# Patient Record
Sex: Female | Born: 1965 | Race: White | Hispanic: No | Smoking: Never smoker
Health system: Southern US, Community
[De-identification: ages and names within clinical notes are randomized; demographics above are authoritative.]

## PROBLEM LIST (undated history)

## (undated) DIAGNOSIS — N760 Acute vaginitis: Secondary | ICD-10-CM

## (undated) DIAGNOSIS — N390 Urinary tract infection, site not specified: Secondary | ICD-10-CM

## (undated) DIAGNOSIS — Z8489 Family history of other specified conditions: Secondary | ICD-10-CM

## (undated) DIAGNOSIS — F419 Anxiety disorder, unspecified: Secondary | ICD-10-CM

## (undated) DIAGNOSIS — B9689 Other specified bacterial agents as the cause of diseases classified elsewhere: Secondary | ICD-10-CM

## (undated) DIAGNOSIS — C801 Malignant (primary) neoplasm, unspecified: Secondary | ICD-10-CM

## (undated) DIAGNOSIS — F32A Depression, unspecified: Secondary | ICD-10-CM

## (undated) DIAGNOSIS — IMO0002 Reserved for concepts with insufficient information to code with codable children: Secondary | ICD-10-CM

## (undated) DIAGNOSIS — N841 Polyp of cervix uteri: Secondary | ICD-10-CM

## (undated) DIAGNOSIS — R87619 Unspecified abnormal cytological findings in specimens from cervix uteri: Secondary | ICD-10-CM

## (undated) HISTORY — DX: Depression, unspecified: F32.A

## (undated) HISTORY — DX: Other specified bacterial agents as the cause of diseases classified elsewhere: B96.89

## (undated) HISTORY — DX: Unspecified abnormal cytological findings in specimens from cervix uteri: R87.619

## (undated) HISTORY — DX: Urinary tract infection, site not specified: N39.0

## (undated) HISTORY — DX: Acute vaginitis: N76.0

## (undated) HISTORY — DX: Anxiety disorder, unspecified: F41.9

## (undated) HISTORY — DX: Reserved for concepts with insufficient information to code with codable children: IMO0002

## (undated) HISTORY — DX: Polyp of cervix uteri: N84.1

## (undated) HISTORY — PX: CERVICAL CONE BIOPSY: SUR198

## (undated) HISTORY — PX: LASIK: SHX215

---

## 1994-02-11 HISTORY — PX: CERVICAL CONE BIOPSY: SUR198

## 2000-06-13 ENCOUNTER — Other Ambulatory Visit: Admission: RE | Admit: 2000-06-13 | Discharge: 2000-06-13 | Payer: Self-pay | Admitting: Obstetrics and Gynecology

## 2004-03-06 ENCOUNTER — Other Ambulatory Visit: Admission: RE | Admit: 2004-03-06 | Discharge: 2004-03-06 | Payer: Self-pay | Admitting: Obstetrics and Gynecology

## 2007-02-09 ENCOUNTER — Other Ambulatory Visit: Admission: RE | Admit: 2007-02-09 | Discharge: 2007-02-09 | Payer: Self-pay | Admitting: Obstetrics and Gynecology

## 2007-10-02 ENCOUNTER — Ambulatory Visit (HOSPITAL_COMMUNITY): Admission: RE | Admit: 2007-10-02 | Discharge: 2007-10-02 | Payer: Self-pay | Admitting: Obstetrics and Gynecology

## 2008-03-02 ENCOUNTER — Other Ambulatory Visit: Admission: RE | Admit: 2008-03-02 | Discharge: 2008-03-02 | Payer: Self-pay | Admitting: Obstetrics and Gynecology

## 2008-10-03 ENCOUNTER — Ambulatory Visit (HOSPITAL_COMMUNITY): Admission: RE | Admit: 2008-10-03 | Discharge: 2008-10-03 | Payer: Self-pay | Admitting: Obstetrics and Gynecology

## 2009-04-13 ENCOUNTER — Other Ambulatory Visit: Admission: RE | Admit: 2009-04-13 | Discharge: 2009-04-13 | Payer: Self-pay | Admitting: Obstetrics and Gynecology

## 2009-10-06 ENCOUNTER — Ambulatory Visit (HOSPITAL_COMMUNITY): Admission: RE | Admit: 2009-10-06 | Discharge: 2009-10-06 | Payer: Self-pay | Admitting: Obstetrics and Gynecology

## 2010-05-09 ENCOUNTER — Other Ambulatory Visit: Payer: Self-pay | Admitting: Obstetrics and Gynecology

## 2010-05-09 ENCOUNTER — Other Ambulatory Visit (HOSPITAL_COMMUNITY)
Admission: RE | Admit: 2010-05-09 | Discharge: 2010-05-09 | Disposition: A | Discharge status: Home / Self Care | Payer: BC Managed Care – PPO | Source: Ambulatory Visit | Attending: Obstetrics and Gynecology | Admitting: Obstetrics and Gynecology

## 2010-05-09 DIAGNOSIS — Z01419 Encounter for gynecological examination (general) (routine) without abnormal findings: Secondary | ICD-10-CM | POA: Insufficient documentation

## 2010-09-20 ENCOUNTER — Other Ambulatory Visit: Payer: Self-pay | Admitting: Obstetrics and Gynecology

## 2010-09-20 DIAGNOSIS — Z139 Encounter for screening, unspecified: Secondary | ICD-10-CM

## 2010-10-08 ENCOUNTER — Ambulatory Visit (HOSPITAL_COMMUNITY)
Admission: RE | Admit: 2010-10-08 | Discharge: 2010-10-08 | Disposition: A | Discharge status: Home / Self Care | Payer: BC Managed Care – PPO | Source: Ambulatory Visit | Attending: Obstetrics and Gynecology | Admitting: Obstetrics and Gynecology

## 2010-10-08 ENCOUNTER — Ambulatory Visit (HOSPITAL_COMMUNITY): Payer: BC Managed Care – PPO

## 2010-10-08 DIAGNOSIS — Z1231 Encounter for screening mammogram for malignant neoplasm of breast: Secondary | ICD-10-CM | POA: Insufficient documentation

## 2010-10-08 DIAGNOSIS — Z139 Encounter for screening, unspecified: Secondary | ICD-10-CM

## 2010-10-18 ENCOUNTER — Other Ambulatory Visit: Payer: Self-pay | Admitting: Obstetrics and Gynecology

## 2010-10-18 DIAGNOSIS — R928 Other abnormal and inconclusive findings on diagnostic imaging of breast: Secondary | ICD-10-CM

## 2010-10-23 ENCOUNTER — Other Ambulatory Visit: Payer: Self-pay | Admitting: Obstetrics and Gynecology

## 2010-10-23 DIAGNOSIS — R928 Other abnormal and inconclusive findings on diagnostic imaging of breast: Secondary | ICD-10-CM

## 2010-10-24 ENCOUNTER — Ambulatory Visit
Admission: RE | Admit: 2010-10-24 | Discharge: 2010-10-24 | Disposition: A | Discharge status: Home / Self Care | Payer: BC Managed Care – PPO | Source: Ambulatory Visit | Attending: Obstetrics and Gynecology | Admitting: Obstetrics and Gynecology

## 2010-10-24 DIAGNOSIS — R928 Other abnormal and inconclusive findings on diagnostic imaging of breast: Secondary | ICD-10-CM

## 2011-05-20 ENCOUNTER — Other Ambulatory Visit (HOSPITAL_COMMUNITY)
Admission: RE | Admit: 2011-05-20 | Discharge: 2011-05-20 | Disposition: A | Discharge status: Home / Self Care | Payer: BC Managed Care – PPO | Source: Ambulatory Visit | Attending: Obstetrics and Gynecology | Admitting: Obstetrics and Gynecology

## 2011-05-20 ENCOUNTER — Other Ambulatory Visit: Payer: Self-pay | Admitting: Obstetrics and Gynecology

## 2011-05-20 DIAGNOSIS — Z01419 Encounter for gynecological examination (general) (routine) without abnormal findings: Secondary | ICD-10-CM | POA: Insufficient documentation

## 2011-09-18 ENCOUNTER — Other Ambulatory Visit: Payer: Self-pay | Admitting: Obstetrics and Gynecology

## 2011-09-18 DIAGNOSIS — Z1231 Encounter for screening mammogram for malignant neoplasm of breast: Secondary | ICD-10-CM

## 2011-10-09 ENCOUNTER — Ambulatory Visit
Admission: RE | Admit: 2011-10-09 | Discharge: 2011-10-09 | Disposition: A | Discharge status: Home / Self Care | Payer: BC Managed Care – PPO | Source: Ambulatory Visit | Attending: Obstetrics and Gynecology | Admitting: Obstetrics and Gynecology

## 2011-10-09 DIAGNOSIS — Z1231 Encounter for screening mammogram for malignant neoplasm of breast: Secondary | ICD-10-CM

## 2012-07-15 ENCOUNTER — Encounter: Payer: Self-pay | Admitting: *Deleted

## 2012-07-15 DIAGNOSIS — N841 Polyp of cervix uteri: Secondary | ICD-10-CM

## 2012-07-16 ENCOUNTER — Encounter: Payer: Self-pay | Admitting: Obstetrics and Gynecology

## 2012-07-16 ENCOUNTER — Ambulatory Visit (INDEPENDENT_AMBULATORY_CARE_PROVIDER_SITE_OTHER): Payer: BC Managed Care – PPO | Admitting: Obstetrics and Gynecology

## 2012-07-16 ENCOUNTER — Other Ambulatory Visit (HOSPITAL_COMMUNITY)
Admission: RE | Admit: 2012-07-16 | Discharge: 2012-07-16 | Disposition: A | Discharge status: Home / Self Care | Payer: BC Managed Care – PPO | Source: Ambulatory Visit | Attending: Obstetrics and Gynecology | Admitting: Obstetrics and Gynecology

## 2012-07-16 VITALS — BP 104/70 | Ht 70.0 in | Wt 116.2 lb

## 2012-07-16 DIAGNOSIS — Z1212 Encounter for screening for malignant neoplasm of rectum: Secondary | ICD-10-CM

## 2012-07-16 DIAGNOSIS — R87810 Cervical high risk human papillomavirus (HPV) DNA test positive: Secondary | ICD-10-CM | POA: Insufficient documentation

## 2012-07-16 DIAGNOSIS — Z01419 Encounter for gynecological examination (general) (routine) without abnormal findings: Secondary | ICD-10-CM

## 2012-07-16 DIAGNOSIS — Z1151 Encounter for screening for human papillomavirus (HPV): Secondary | ICD-10-CM | POA: Insufficient documentation

## 2012-07-16 LAB — HEMOCCULT GUIAC POC 1CARD (OFFICE): Fecal Occult Blood, POC: NEGATIVE

## 2012-07-16 MED ORDER — ETONOGESTREL-ETHINYL ESTRADIOL 0.12-0.015 MG/24HR VA RING: VAGINAL_RING | VAGINAL | Status: DC

## 2012-07-16 NOTE — Patient Instructions (Signed)
Keep up the good work Labs next year

## 2012-07-16 NOTE — Progress Notes (Signed)
Patient ID: Tara Jensen, female   DOB: 02-02-1966, 47 y.o.   MRN: [id number] Pt here today for annual exam, voiced no problems or complaints today Subjective:     Tara Jensen is a 47 y.o. female here for a routine exam.  Current complaints: none.  Personal health questionnaire reviewed: no.   Gynecologic History Patient's last menstrual period was 06/22/2012. Contraception: NuvaRing vaginal inserts Last Pap: 2013. Results were: normal Last mammogram: 2013. Results were: normal  Obstetric History OB History   Grav Para Term Preterm Abortions TAB SAB Ect Mult Living   1 1        1      # Outc Date GA Lbr Len/2nd Wgt Sex Del Anes PTL Lv   1 PAR 1998    M SVD   Yes         Review of Systems  Review of Systems  Constitutional: Negative for fever, chills, weight loss, malaise/fatigue and diaphoresis.  HENT: Negative for hearing loss, ear pain, nosebleeds, congestion, sore throat, neck pain, tinnitus and ear discharge.   Eyes: Negative for blurred vision, double vision, photophobia, pain, discharge and redness.  Respiratory: Negative for cough, hemoptysis, sputum production, shortness of breath, wheezing and stridor.   Cardiovascular: Negative for chest pain, palpitations, orthopnea, claudication, leg swelling and PND.  Gastrointestinal: negative for abdominal pain. Negative for heartburn, nausea, vomiting, diarrhea, constipation, blood in stool and melena.  Genitourinary: Negative for dysuria, urgency, frequency, hematuria and flank pain.  Musculoskeletal: Negative for myalgias, back pain, joint pain and falls.  Skin: Negative for itching and rash.  Neurological: Negative for dizziness, tingling, tremors, sensory change, speech change, focal weakness, seizures, loss of consciousness, weakness and headaches.  Endo/Heme/Allergies: Negative for environmental allergies and polydipsia. Does not bruise/bleed easily.  Psychiatric/Behavioral: Negative for depression, suicidal ideas,  hallucinations, memory loss and substance abuse. The patient is not nervous/anxious and does not have insomnia.        Objective:    Physical Exam  Vitals reviewed. Constitutional: She is oriented to person, place, and time. She appears well-developed and well-nourished.  HENT:  Head: Normocephalic and atraumatic.         GI: Soft. Bowel sounds are normal. She exhibits no distension and no mass. There is no tenderness. There is no rebound and no guarding.  Genitourinary:       Vulva is normal without lesions Vagina is pink moist without discharge Cervix normal in appearance and pap is done Uterus is normal size shape and contour Adnexa is negative with normal sized ovaries by sonogram  Musculoskeletal: Normal range of motion. She exhibits no edema and no tenderness.  Neurological: She is alert and oriented to person, place, and time. She has normal reflexes. She displays normal reflexes. No cranial nerve deficit. She exhibits normal muscle tone. Coordination normal.  Skin: Skin is warm and dry. No rash noted. No erythema. No pallor.  Psychiatric: She has a normal mood and affect. Her behavior is normal. Judgment and thought content normal.       Assessment:    Healthy female exam.    Plan:    Contraception: NuvaRing vaginal inserts.

## 2012-09-01 ENCOUNTER — Other Ambulatory Visit: Payer: Self-pay

## 2012-09-01 DIAGNOSIS — Z1231 Encounter for screening mammogram for malignant neoplasm of breast: Secondary | ICD-10-CM

## 2012-10-09 ENCOUNTER — Ambulatory Visit: Payer: BC Managed Care – PPO

## 2012-10-16 ENCOUNTER — Ambulatory Visit
Admission: RE | Admit: 2012-10-16 | Discharge: 2012-10-16 | Disposition: A | Discharge status: Home / Self Care | Payer: BC Managed Care – PPO | Source: Ambulatory Visit

## 2012-10-16 DIAGNOSIS — Z1231 Encounter for screening mammogram for malignant neoplasm of breast: Secondary | ICD-10-CM

## 2013-07-19 ENCOUNTER — Encounter: Payer: Self-pay | Admitting: Obstetrics and Gynecology

## 2013-07-19 ENCOUNTER — Other Ambulatory Visit (HOSPITAL_COMMUNITY)
Admission: RE | Admit: 2013-07-19 | Discharge: 2013-07-19 | Disposition: A | Discharge status: Home / Self Care | Payer: BC Managed Care – PPO | Source: Ambulatory Visit | Attending: Obstetrics and Gynecology | Admitting: Obstetrics and Gynecology

## 2013-07-19 ENCOUNTER — Ambulatory Visit (INDEPENDENT_AMBULATORY_CARE_PROVIDER_SITE_OTHER): Payer: BC Managed Care – PPO | Admitting: Obstetrics and Gynecology

## 2013-07-19 VITALS — BP 90/62 | Ht 70.75 in | Wt 118.4 lb

## 2013-07-19 DIAGNOSIS — Z1212 Encounter for screening for malignant neoplasm of rectum: Secondary | ICD-10-CM

## 2013-07-19 DIAGNOSIS — Z01419 Encounter for gynecological examination (general) (routine) without abnormal findings: Secondary | ICD-10-CM

## 2013-07-19 DIAGNOSIS — Z1151 Encounter for screening for human papillomavirus (HPV): Secondary | ICD-10-CM | POA: Insufficient documentation

## 2013-07-19 LAB — HEMOCCULT GUIAC POC 1CARD (OFFICE): Fecal Occult Blood, POC: NEGATIVE

## 2013-07-19 MED ORDER — ETONOGESTREL-ETHINYL ESTRADIOL 0.12-0.015 MG/24HR VA RING: VAGINAL_RING | VAGINAL | Status: DC

## 2013-07-19 NOTE — Patient Instructions (Signed)
HPV Test The HPV (human papillomavirus) test is used to screen for high-risk types with HPV infection. HPV is a group of about 100 related viruses, of which 40 types are genital viruses. Most HPV viruses cause infections that usually resolve without treatment within 2 years. Some HPV infections can cause skin and genital warts (condylomata). HPV types 16, 18, 31 and 45 are considered high-risk types of HPV. High-risk types of HPV do not usually cause visible warts, but if untreated, may lead to cancers of the outlet of the womb (cervix) or anus. An HPV test identifies the DNA (genetic) strands of the HPV infection. Because the test identifies the DNA strands, the test is also referred to as the HPV DNA test. Although HPV is found in both males and females, the HPV test is only used to screen for cervical cancer in females. This test is recommended for females:  With an abnormal Pap test.  After treatment of an abnormal Pap test.  Aged 42 and older.  After treatment of a high-risk HPV infection. The HPV test may be done at the same time as a Pap test in females over the age of 25. Both the HPV and Pap test require a sample of cells from the cervix. PREPARATION FOR TEST  You may be asked to avoid douching, tampons, or vaginal medicines for 48 hours before the HPV test. You will be asked to urinate before the test. For the HPV test, you will need to lie on an exam table with your feet in stirrups. A spatula will be inserted into the vagina. The spatula will be used to swab the cervix for a cell and mucus sample. The sample will be further evaluated in a lab under a microscope. NORMAL FINDINGS  Normal: High-risk HPV is not found.  Ranges for normal findings may vary among different laboratories and hospitals. You should always check with your doctor after having lab work or other tests done to discuss the meaning of your test results and whether your values are considered within normal limits. MEANING  OF TEST An abnormal HPV test means that high-risk HPV is found. Your caregiver may recommend further testing. Your caregiver will go over the test results with you. He or she will and discuss the importance and meaning of your results, as well as treatment options and the need for additional tests, if necessary. OBTAINING THE RESULTS  It is your responsibility to obtain your test results. Ask the lab or department performing the test when and how you will get your results. Document Released: 02/23/2004 Document Revised: 04/22/2011 Document Reviewed: 11/07/2004 [facility] Patient Information 2014 [place].

## 2013-07-19 NOTE — Progress Notes (Signed)
Patient ID: Tara Jensen, female   DOB: February 20, 1965, 48 y.o.   MRN: [id number]  Assessment:  Annual Gyn Exam   Plan:  1. pap smear done, next pap due annual 2. return annually or prn 3    Annual mammogram advised Subjective:  Tara Jensen is a 48 y.o. female G1P1 who presents for annual exam. Patient's last menstrual period was 07/11/2013. The patient has complaints today of none  The following portions of the patient's history were reviewed and updated as appropriate: allergies, current medications, past family history, past medical history, past social history, past surgical history and problem list.  Review of Systems Constitutional: negative Gastrointestinal: negative Genitourinary: none  Objective:  BP 90/62  Ht 5' 10.75" (1.797 m)  Wt 118 lb 6.4 oz (53.706 kg)  BMI 16.63 kg/m2  LMP 07/11/2013   BMI: Body mass index is 16.63 kg/(m^2).  General Appearance: Alert, appropriate appearance for age. No acute distress HEENT: Grossly normal Neck / Thyroid:  Cardiovascular: RRR; normal S1, S2, no murmur Lungs: CTA bilaterally Back: No CVAT Breast Exam: No masses or nodes.No dimpling, nipple retraction or discharge. Gastrointestinal: Soft, non-tender, no masses or organomegaly Pelvic Exam: Vulva and vagina appear normal. Bimanual exam reveals normal uterus and adnexa. Uterus: retroverted and nuvaring is in front of cervix, repositioned and pt instructed in self check . Rectovaginal: normal rectal, no masses and guaiac negative stool obtained Lymphatic Exam: Non-palpable nodes in neck, clavicular, axillary, or inguinal regions Skin: no rash or abnormalities Neurologic: Normal gait and speech, no tremor  Psychiatric: Alert and oriented, appropriate affect.  Urinalysis:Not done  Mallory Shirk. MD Pgr [phone] 9:34 AM

## 2013-07-20 ENCOUNTER — Telehealth: Payer: Self-pay | Admitting: Obstetrics and Gynecology

## 2013-07-20 LAB — CYTOLOGY - PAP

## 2013-07-20 NOTE — Telephone Encounter (Signed)
Pt informed of normal pap and Neg HPV

## 2013-09-13 ENCOUNTER — Other Ambulatory Visit: Payer: Self-pay

## 2013-09-13 DIAGNOSIS — Z1231 Encounter for screening mammogram for malignant neoplasm of breast: Secondary | ICD-10-CM

## 2013-10-19 ENCOUNTER — Ambulatory Visit
Admission: RE | Admit: 2013-10-19 | Discharge: 2013-10-19 | Disposition: A | Discharge status: Home / Self Care | Payer: BC Managed Care – PPO | Source: Ambulatory Visit

## 2013-10-19 DIAGNOSIS — Z1231 Encounter for screening mammogram for malignant neoplasm of breast: Secondary | ICD-10-CM

## 2013-12-13 ENCOUNTER — Encounter: Payer: Self-pay | Admitting: Obstetrics and Gynecology

## 2014-07-25 ENCOUNTER — Encounter: Payer: Self-pay | Admitting: Obstetrics and Gynecology

## 2014-07-25 ENCOUNTER — Other Ambulatory Visit (HOSPITAL_COMMUNITY)
Admission: RE | Admit: 2014-07-25 | Discharge: 2014-07-25 | Disposition: A | Discharge status: Home / Self Care | Payer: BC Managed Care – PPO | Source: Ambulatory Visit | Attending: Obstetrics and Gynecology | Admitting: Obstetrics and Gynecology

## 2014-07-25 ENCOUNTER — Ambulatory Visit (INDEPENDENT_AMBULATORY_CARE_PROVIDER_SITE_OTHER): Payer: BC Managed Care – PPO | Admitting: Obstetrics and Gynecology

## 2014-07-25 VITALS — BP 102/68 | HR 64 | Ht 70.5 in | Wt 123.4 lb

## 2014-07-25 DIAGNOSIS — Z1151 Encounter for screening for human papillomavirus (HPV): Secondary | ICD-10-CM | POA: Insufficient documentation

## 2014-07-25 DIAGNOSIS — Z01419 Encounter for gynecological examination (general) (routine) without abnormal findings: Secondary | ICD-10-CM

## 2014-07-25 DIAGNOSIS — Z1212 Encounter for screening for malignant neoplasm of rectum: Secondary | ICD-10-CM | POA: Diagnosis not present

## 2014-07-25 DIAGNOSIS — Z1211 Encounter for screening for malignant neoplasm of colon: Secondary | ICD-10-CM

## 2014-07-25 DIAGNOSIS — Z139 Encounter for screening, unspecified: Secondary | ICD-10-CM

## 2014-07-25 LAB — POC HEMOCCULT BLD/STL (OFFICE/1-CARD/DIAGNOSTIC): Fecal Occult Blood, POC: NEGATIVE

## 2014-07-25 MED ORDER — ETONOGESTREL-ETHINYL ESTRADIOL 0.12-0.015 MG/24HR VA RING: VAGINAL_RING | VAGINAL | Status: DC

## 2014-07-25 NOTE — Addendum Note (Signed)
Addended by: Traci Sermon A on: 07/25/2014 11:56 AM   Modules accepted: Orders

## 2014-07-25 NOTE — Progress Notes (Signed)
Patient ID: Tara Jensen, female   DOB: Sep 24, 1965, 49 y.o.   MRN: [id number]  Assessment:  Annual Gyn Exam   Plan:  1. pap smear done, next pap due annually pending HPV results 2. return annually or prn 3    Annual mammogram advised Subjective:  Tara Jensen is a 49 y.o. female G1P1 who presents for annual exam. Patient's last menstrual period was 06/27/2014 (approximate). The patient has no acute complaints today. Pt has history of positive HPV in 2014. She had a negative test in 2015. Pt reports baseline lower back pain that accompanies menstrual cycles.   The following portions of the patient's history were reviewed and updated as appropriate: allergies, current medications, past family history, past medical history, past social history, past surgical history and problem list. Past Medical History  Diagnosis Date  . BV (bacterial vaginosis)   . Recurrent UTI (urinary tract infection)   . Cervical polyp   . Abnormal Pap smear     Past Surgical History  Procedure Laterality Date  . Cervical cone biopsy      Current outpatient prescriptions:  .  etonogestrel-ethinyl estradiol (NUVARING) 0.12-0.015 MG/24HR vaginal ring, Insert vaginally and leave in place for 3 consecutive weeks, then remove for 1 week., Disp: 1 each, Rfl: 12  Review of Systems Constitutional: negative Gastrointestinal: negative Genitourinary: negative  Objective:  BP 102/68 mmHg  Pulse 64  Ht 5' 10.5" (1.791 m)  Wt 123 lb 6.4 oz (55.974 kg)  BMI 17.45 kg/m2  LMP 06/27/2014 (Approximate)   BMI: Body mass index is 17.45 kg/(m^2).  General Appearance: Alert, appropriate appearance for age. No acute distress HEENT: Grossly normal Neck / Thyroid:  Cardiovascular: RRR; normal S1, S2, no murmur Lungs: CTA bilaterally Back: No CVAT Breast Exam: No masses or nodes.No dimpling, nipple retraction or discharge. Gastrointestinal: Soft, non-tender, no masses or organomegaly Pelvic Exam: Vulva and vagina  appear normal. Bimanual exam reveals normal uterus and adnexa. Vaginal: normal mucosa without prolapse or lesions and normal without tenderness, induration or masses Cervix: normal appearance Adnexa: normal bimanual exam Uterus: normal single, nontender; retroverted Rectovaginal: guaiac negative stool obtained Lymphatic Exam: Non-palpable nodes in neck, clavicular, axillary, or inguinal regions  Skin: no rash or abnormalities Neurologic: Normal gait and speech, no tremor  Psychiatric: Alert and oriented, appropriate affect.  Urinalysis:Not done  Hemoccult: Negative  Mallory Shirk. MD Pgr [phone] 10:55 AM    This chart was scribed for Jonnie Kind, MD by Tula Nakayama, ED Scribe. This patient was seen in room 2 and the patient's care was started at 10:55 AM.   I personally performed the services described in this documentation, which was SCRIBED in my presence. The recorded information has been reviewed and considered accurate. It has been edited as necessary during review. Jonnie Kind, MD

## 2014-07-27 LAB — CYTOLOGY - PAP

## 2014-09-20 ENCOUNTER — Other Ambulatory Visit: Payer: Self-pay

## 2014-09-20 DIAGNOSIS — Z1231 Encounter for screening mammogram for malignant neoplasm of breast: Secondary | ICD-10-CM

## 2014-10-24 ENCOUNTER — Ambulatory Visit
Admission: RE | Admit: 2014-10-24 | Discharge: 2014-10-24 | Disposition: A | Discharge status: Home / Self Care | Payer: BC Managed Care – PPO | Source: Ambulatory Visit

## 2014-10-24 DIAGNOSIS — Z1231 Encounter for screening mammogram for malignant neoplasm of breast: Secondary | ICD-10-CM

## 2015-07-31 ENCOUNTER — Other Ambulatory Visit: Payer: BC Managed Care – PPO | Admitting: Obstetrics and Gynecology

## 2015-08-09 ENCOUNTER — Encounter: Payer: Self-pay | Admitting: Obstetrics and Gynecology

## 2015-08-09 ENCOUNTER — Other Ambulatory Visit (HOSPITAL_COMMUNITY)
Admission: RE | Admit: 2015-08-09 | Discharge: 2015-08-09 | Disposition: A | Discharge status: Home / Self Care | Payer: BC Managed Care – PPO | Source: Ambulatory Visit | Attending: Obstetrics and Gynecology | Admitting: Obstetrics and Gynecology

## 2015-08-09 ENCOUNTER — Ambulatory Visit (INDEPENDENT_AMBULATORY_CARE_PROVIDER_SITE_OTHER): Payer: BC Managed Care – PPO | Admitting: Obstetrics and Gynecology

## 2015-08-09 VITALS — BP 96/60 | Ht 70.5 in | Wt 124.5 lb

## 2015-08-09 DIAGNOSIS — N816 Rectocele: Secondary | ICD-10-CM

## 2015-08-09 DIAGNOSIS — Z01419 Encounter for gynecological examination (general) (routine) without abnormal findings: Secondary | ICD-10-CM

## 2015-08-09 DIAGNOSIS — Z01411 Encounter for gynecological examination (general) (routine) with abnormal findings: Secondary | ICD-10-CM

## 2015-08-09 DIAGNOSIS — Z1151 Encounter for screening for human papillomavirus (HPV): Secondary | ICD-10-CM | POA: Insufficient documentation

## 2015-08-09 MED ORDER — ETONOGESTREL-ETHINYL ESTRADIOL 0.12-0.015 MG/24HR VA RING: VAGINAL_RING | VAGINAL | Status: DC

## 2015-08-09 NOTE — Progress Notes (Addendum)
Patient ID: Tara Jensen, female   DOB: Jul 05, 1965, 50 y.o.   MRN: [id number] Assessment:  Annual Gyn Exam Contraception Management Stable Uterine Retroversion Asymptomatic Small Rectocele Plan:  1. pap smear done, next pap due in 1 year 2. return annually or prn 3    Annual mammogram advised 4.   Continue Nuvaring for contraception 5. Lab profile ordered Subjective:  Tara Jensen is a 50 y.o. female G1P1 who presents for annual exam. Patient's last menstrual period was 07/09/2015. The patient has no complaints. Patient denies any problems with bowel movements. Per chart review, patient has a history of positive HPV in 2014; however, she had a negative test in 2015, per medical records.  The following portions of the patient's history were reviewed and updated as appropriate: allergies, current medications, past family history, past medical history, past social history, past surgical history and problem list. Past Medical History  Diagnosis Date  . BV (bacterial vaginosis)   . Recurrent UTI (urinary tract infection)   . Cervical polyp   . Abnormal Pap smear     Past Surgical History  Procedure Laterality Date  . Cervical cone biopsy       Current outpatient prescriptions:  .  etonogestrel-ethinyl estradiol (NUVARING) 0.12-0.015 MG/24HR vaginal ring, Insert vaginally and leave in place for 3 consecutive weeks, then remove for 1 week., Disp: 1 each, Rfl: 12  Review of Systems Constitutional: negative Gastrointestinal: negative Genitourinary: negative A complete review of systems was obtained and all systems are negative except as noted in the HPI and PMH.    Objective:  BP 96/60 mmHg  Ht 5' 10.5" (1.791 m)  Wt 124 lb 8 oz (56.473 kg)  BMI 17.61 kg/m2  LMP 07/09/2015   BMI: Body mass index is 17.61 kg/(m^2).  General Appearance: Alert, appropriate appearance for age. No acute distress HEENT: Grossly normal Neck / Thyroid:  Cardiovascular: RRR; normal S1, S2, no  murmur Lungs: CTA bilaterally Back: No CVAT Breast Exam: Normal to inspection, Normal breast tissue bilaterally and No masses or nodes.No dimpling, nipple retraction or discharge. Gastrointestinal: Soft, non-tender, no masses or organomegaly Pelvic Exam: External genitalia: normal general appearance; skin tag present that pt states does not bother her Urinary system: urethral meatus normal Vaginal: normal mucosa without prolapse or lesions, normal without tenderness, induration or masses and normal rugae, small rectocele - asymptomatic Cervix: normal appearance Adnexa: normal bimanual exam Uterus: normal single, nontender, mildly retroverted Rectal: good sphincter tone, no masses and guaiac negative small rectocele defect above sphincter, no symptoms at present, discussed Rectovaginal: normal rectal, no masses Lymphatic Exam: Non-palpable nodes in neck, clavicular, axillary, or inguinal regions  Skin: no rash or abnormalities Neurologic: Normal gait and speech, no tremor  Psychiatric: Alert and oriented, appropriate affect.  Urinalysis:Not done  Mallory Shirk. MD Pgr [phone] 9:15 AM    By signing my name below, I, Stephania Fragmin, attest that this documentation has been prepared under the direction and in the presence of Jonnie Kind, MD. Electronically Signed: Stephania Fragmin, ED Scribe. 08/09/2015. 9:29 AM.  I personally performed the services described in this documentation, which was SCRIBED in my presence. The recorded information has been reviewed and considered accurate. It has been edited as necessary during review. Jonnie Kind, MD   .I personally performed the services described in this documentation, which was SCRIBED in my presence. The recorded information has been reviewed and considered accurate. It has been edited as necessary during review. Jonnie Kind, MD

## 2015-08-10 LAB — LIPID PANEL
Chol/HDL Ratio: 2.1 ratio units (ref 0.0–4.4)
Cholesterol, Total: 163 mg/dL (ref 100–199)
HDL: 76 mg/dL (ref >39)
LDL Calculated: 70 mg/dL (ref 0–99)
Triglycerides: 84 mg/dL (ref 0–149)
VLDL Cholesterol Cal: 17 mg/dL (ref 5–40)

## 2015-08-10 LAB — CBC
Hematocrit: 42 % (ref 34.0–46.6)
Hemoglobin: 13.8 g/dL (ref 11.1–15.9)
MCH: 33 pg (ref 26.6–33.0)
MCHC: 32.9 g/dL (ref 31.5–35.7)
MCV: 101 fL — ABNORMAL HIGH (ref 79–97)
Platelets: 236 10*3/uL (ref 150–379)
RBC: 4.18 x10E6/uL (ref 3.77–5.28)
RDW: 12.9 % (ref 12.3–15.4)
WBC: 4.1 10*3/uL (ref 3.4–10.8)

## 2015-08-10 LAB — CYTOLOGY - PAP

## 2015-08-10 LAB — COMPREHENSIVE METABOLIC PANEL
ALT: 12 IU/L (ref 0–32)
AST: 15 IU/L (ref 0–40)
Albumin/Globulin Ratio: 1.4 (ref 1.2–2.2)
Albumin: 4.2 g/dL (ref 3.5–5.5)
Alkaline Phosphatase: 48 IU/L (ref 39–117)
BUN/Creatinine Ratio: 19 (ref 9–23)
BUN: 18 mg/dL (ref 6–24)
Bilirubin Total: 0.5 mg/dL (ref 0.0–1.2)
CO2: 23 mmol/L (ref 18–29)
Calcium: 9.5 mg/dL (ref 8.7–10.2)
Chloride: 104 mmol/L (ref 96–106)
Creatinine, Ser: 0.95 mg/dL (ref 0.57–1.00)
GFR calc Af Amer: 82 mL/min/{1.73_m2} (ref >59)
GFR calc non Af Amer: 72 mL/min/{1.73_m2} (ref >59)
Globulin, Total: 3 g/dL (ref 1.5–4.5)
Glucose: 79 mg/dL (ref 65–99)
Potassium: 4.8 mmol/L (ref 3.5–5.2)
Sodium: 140 mmol/L (ref 134–144)
Total Protein: 7.2 g/dL (ref 6.0–8.5)

## 2015-08-10 LAB — TSH: TSH: 0.943 u[IU]/mL (ref 0.450–4.500)

## 2015-08-11 ENCOUNTER — Telehealth: Payer: Self-pay | Admitting: *Deleted

## 2015-08-14 NOTE — Telephone Encounter (Signed)
Pt informed of all normal lab results from 08/09/2015 per Dr.Ferguson.

## 2015-09-15 ENCOUNTER — Other Ambulatory Visit: Payer: Self-pay | Admitting: Obstetrics and Gynecology

## 2015-09-15 DIAGNOSIS — Z1231 Encounter for screening mammogram for malignant neoplasm of breast: Secondary | ICD-10-CM

## 2015-10-25 ENCOUNTER — Inpatient Hospital Stay: Admission: RE | Admit: 2015-10-25 | Payer: BC Managed Care – PPO | Source: Ambulatory Visit

## 2015-10-25 ENCOUNTER — Ambulatory Visit
Admission: RE | Admit: 2015-10-25 | Discharge: 2015-10-25 | Disposition: A | Discharge status: Home / Self Care | Payer: BC Managed Care – PPO | Source: Ambulatory Visit | Attending: Obstetrics and Gynecology | Admitting: Obstetrics and Gynecology

## 2015-10-25 DIAGNOSIS — Z1231 Encounter for screening mammogram for malignant neoplasm of breast: Secondary | ICD-10-CM

## 2016-08-19 ENCOUNTER — Ambulatory Visit (INDEPENDENT_AMBULATORY_CARE_PROVIDER_SITE_OTHER): Payer: BC Managed Care – PPO | Admitting: Obstetrics and Gynecology

## 2016-08-19 ENCOUNTER — Other Ambulatory Visit (HOSPITAL_COMMUNITY)
Admission: RE | Admit: 2016-08-19 | Discharge: 2016-08-19 | Disposition: A | Discharge status: Home / Self Care | Payer: BC Managed Care – PPO | Source: Ambulatory Visit | Attending: Obstetrics and Gynecology | Admitting: Obstetrics and Gynecology

## 2016-08-19 ENCOUNTER — Encounter: Payer: Self-pay | Admitting: Obstetrics and Gynecology

## 2016-08-19 VITALS — BP 114/70 | HR 80 | Wt 126.2 lb

## 2016-08-19 DIAGNOSIS — Z01419 Encounter for gynecological examination (general) (routine) without abnormal findings: Secondary | ICD-10-CM | POA: Insufficient documentation

## 2016-08-19 DIAGNOSIS — Z1212 Encounter for screening for malignant neoplasm of rectum: Secondary | ICD-10-CM

## 2016-08-19 LAB — HEMOCCULT GUIAC POC 1CARD (OFFICE): Fecal Occult Blood, POC: NEGATIVE

## 2016-08-19 MED ORDER — ETONOGESTREL-ETHINYL ESTRADIOL 0.12-0.015 MG/24HR VA RING: VAGINAL_RING | VAGINAL | 12 refills | Status: DC

## 2016-08-19 NOTE — Progress Notes (Signed)
  Assessment:  Annual Gyn Exam  Hx abnormal pap on annual f/u x 20 yr Contraception management with NuvaRing Plan:  1. pap smear done, next pap due in 1 year 2. return annually or prn 3    Annual mammogram advised  Subjective:  Tara Jensen is a 51 y.o. female G1P1 who presents for annual exam. No LMP recorded. Using NuvaRing as continuous OCP regimen The patient has no complaints today.  Hx cone biopsy cervix The following portions of the patient's history were reviewed and updated as appropriate: allergies, current medications, past family history, past medical history, past social history, past surgical history and problem list.  Past Medical History:  Diagnosis Date  . Abnormal Pap smear   . BV (bacterial vaginosis)   . Cervical polyp   . Recurrent UTI (urinary tract infection)     Past Surgical History:  Procedure Laterality Date  . CERVICAL CONE BIOPSY       Current Outpatient Prescriptions:  .  etonogestrel-ethinyl estradiol (NUVARING) 0.12-0.015 MG/24HR vaginal ring, Insert vaginally and leave in place for 3 consecutive weeks, then remove for 1 week., Disp: 1 each, Rfl: 12  Review of Systems Constitutional: negative Gastrointestinal: negative Genitourinary: Negative  Objective:  There were no vitals taken for this visit.   BMI: There is no height or weight on file to calculate BMI.  General Appearance: Alert, appropriate appearance for age. No acute distress HEENT: Grossly normal Neck / Thyroid:  Cardiovascular: RRR; normal S1, S2, no murmur Lungs: CTA bilaterally Back: No CVAT Breast Exam: No dimpling, nipple retraction or discharge. No masses or nodes., Normal to inspection, Normal breast tissue bilaterally and No masses or nodes.No dimpling, nipple retraction or discharge. Gastrointestinal: Soft, non-tender, no masses or organomegaly Pelvic Exam: Vulva and vagina appear normal. Bimanual exam reveals normal uterus and adnexa. Vaginal: normal mucosa  without prolapse or lesions, normal without tenderness, induration or masses and normal rugae Cervix: normal, multiparous  Adnexa: normal bimanual exam Uterus: normal, tiny, retroverted Rectovaginal: normal rectal, no masses and guaiac negative stool obtained Lymphatic Exam: Non-palpable nodes in neck, clavicular, axillary, or inguinal regions Skin: no rash or abnormalities Neurologic: Normal gait and speech, no tremor  Psychiatric: Alert and oriented, appropriate affect.  Urinalysis:Not done  Mallory Shirk. MD Pgr [phone] 11:47 AM   By signing my name below, I, Jabier Gauss, attest that this documentation has been prepared under the direction and in the presence of Jonnie Kind, MD. Electronically Signed: Jabier Gauss, ED Scribe. 08/19/16. 11:51 AM.  I personally performed the services described in this documentation, which was SCRIBED in my presence. The recorded information has been reviewed and considered accurate. It has been edited as necessary during review. Jonnie Kind, MD

## 2016-08-20 LAB — CYTOLOGY - PAP
Diagnosis: NEGATIVE
HPV: NOT DETECTED

## 2016-09-20 ENCOUNTER — Other Ambulatory Visit: Payer: Self-pay | Admitting: Obstetrics and Gynecology

## 2016-09-20 DIAGNOSIS — Z1231 Encounter for screening mammogram for malignant neoplasm of breast: Secondary | ICD-10-CM

## 2016-10-25 ENCOUNTER — Ambulatory Visit
Admission: RE | Admit: 2016-10-25 | Discharge: 2016-10-25 | Disposition: A | Discharge status: Home / Self Care | Payer: BC Managed Care – PPO | Source: Ambulatory Visit | Attending: Obstetrics and Gynecology | Admitting: Obstetrics and Gynecology

## 2016-10-25 DIAGNOSIS — Z1231 Encounter for screening mammogram for malignant neoplasm of breast: Secondary | ICD-10-CM

## 2017-08-20 ENCOUNTER — Other Ambulatory Visit: Payer: BC Managed Care – PPO | Admitting: Obstetrics and Gynecology

## 2017-08-25 ENCOUNTER — Other Ambulatory Visit (HOSPITAL_COMMUNITY)
Admission: RE | Admit: 2017-08-25 | Discharge: 2017-08-25 | Disposition: A | Discharge status: Home / Self Care | Payer: BC Managed Care – PPO | Source: Ambulatory Visit | Attending: Obstetrics and Gynecology | Admitting: Obstetrics and Gynecology

## 2017-08-25 ENCOUNTER — Other Ambulatory Visit: Payer: Self-pay

## 2017-08-25 ENCOUNTER — Ambulatory Visit (INDEPENDENT_AMBULATORY_CARE_PROVIDER_SITE_OTHER): Payer: BC Managed Care – PPO | Admitting: Obstetrics and Gynecology

## 2017-08-25 ENCOUNTER — Encounter: Payer: Self-pay | Admitting: Obstetrics and Gynecology

## 2017-08-25 VITALS — BP 118/61 | Ht 70.0 in | Wt 115.8 lb

## 2017-08-25 DIAGNOSIS — Z01419 Encounter for gynecological examination (general) (routine) without abnormal findings: Secondary | ICD-10-CM

## 2017-08-25 MED ORDER — ETONOGESTREL-ETHINYL ESTRADIOL 0.12-0.015 MG/24HR VA RING: VAGINAL_RING | VAGINAL | 12 refills | Status: DC

## 2017-08-25 NOTE — Progress Notes (Signed)
Patient ID: Tara Jensen, female   DOB: 1965/07/04, 52 y.o.   MRN: [id number]   Assessment:  Annual Gyn Exam Plan:  1. Annual mammogram advised after age 48 2. Pap done, next one done in 1 year Subjective:  Tara Jensen is a 52 y.o. female G1P1 who presents for annual exam. No LMP recorded. The patient has complaints today of emotional difficulties. She has never been in a relationship with a partner who is clinically depressed and has been taking a toll on her emotionally as well. It has taken her partner 3 months to get to see psychiatrist. She notes that it was better when she was working but since she doesn't work during the summer she has to deal with the worst of the depression since she is home more. She has started seeing a therapist and it has helped tremendously. The following portions of the patient's history were reviewed and updated as appropriate: allergies, current medications, past family history, past medical history, past social history, past surgical history and problem list. Past Medical History:  Diagnosis Date  . Abnormal Pap smear   . BV (bacterial vaginosis)   . Cervical polyp   . Recurrent UTI (urinary tract infection)     Past Surgical History:  Procedure Laterality Date  . CERVICAL CONE BIOPSY       Current Outpatient Medications:  .  etonogestrel-ethinyl estradiol (NUVARING) 0.12-0.015 MG/24HR vaginal ring, Insert vaginally and leave in place for 3 consecutive weeks, then remove for 1 week., Disp: 1 each, Rfl: 12  Review of Systems Constitutional: negative Gastrointestinal: negative Genitourinary: Normal  Objective:  There were no vitals taken for this visit.   BMI: There is no height or weight on file to calculate BMI.  General Appearance: Alert, appropriate appearance for age. No acute distress HEENT: Grossly normal Neck / Thyroid:  Cardiovascular: RRR; normal S1, S2, no murmur Lungs: CTA bilaterally Back: No CVAT Breast Exam: No dimpling,  nipple retraction or discharge. No masses or nodes., Normal to inspection, Normal breast tissue bilaterally and No masses or nodes.No dimpling, nipple retraction or discharge. Mammogram normal 10/25/2016 Gastrointestinal: Soft, non-tender, no masses or organomegaly Pelvic Exam:  VULVA: normal appearing vulva with no masses, tenderness or lesions, Right labia majora skin tag VAGINA: normal appearing vagina with normal color and discharge, no lesions CERVIX: normal appearing cervix without discharge or lesions UTERUS: anteverted, but otherwise tiny and normal Rectal exam: negative without mass, lesions or tenderness, stool guaiac negative. PAP: Pap smear performed. Lymphatic Exam: Non-palpable nodes in neck, clavicular, axillary, or inguinal regions Skin: no rash or abnormalities Neurologic: Normal gait and speech, no tremor  Psychiatric: Alert and oriented, appropriate affect.  Urinalysis:Not done  By signing my name below, I, Samul Dada, attest that this documentation has been prepared under the direction and in the presence of Jonnie Kind, MD. Electronically Signed: [place]. 08/25/17. 8:53 AM.  I personally performed the services described in this documentation, which was SCRIBED in my presence. The recorded information has been reviewed and considered accurate. It has been edited as necessary during review. Jonnie Kind, MD

## 2017-08-26 LAB — COMPREHENSIVE METABOLIC PANEL
ALT: 16 IU/L (ref 0–32)
AST: 16 IU/L (ref 0–40)
Albumin/Globulin Ratio: 1.5 (ref 1.2–2.2)
Albumin: 4.5 g/dL (ref 3.5–5.5)
Alkaline Phosphatase: 43 IU/L (ref 39–117)
BUN/Creatinine Ratio: 24 — ABNORMAL HIGH (ref 9–23)
BUN: 20 mg/dL (ref 6–24)
Bilirubin Total: 0.5 mg/dL (ref 0.0–1.2)
CO2: 22 mmol/L (ref 20–29)
Calcium: 9.4 mg/dL (ref 8.7–10.2)
Chloride: 104 mmol/L (ref 96–106)
Creatinine, Ser: 0.83 mg/dL (ref 0.57–1.00)
GFR calc Af Amer: 96 mL/min/{1.73_m2} (ref >59)
GFR calc non Af Amer: 83 mL/min/{1.73_m2} (ref >59)
Globulin, Total: 3 g/dL (ref 1.5–4.5)
Glucose: 81 mg/dL (ref 65–99)
Potassium: 4.2 mmol/L (ref 3.5–5.2)
Sodium: 140 mmol/L (ref 134–144)
Total Protein: 7.5 g/dL (ref 6.0–8.5)

## 2017-08-26 LAB — TSH: TSH: 1.22 u[IU]/mL (ref 0.450–4.500)

## 2017-08-26 LAB — CBC
Hematocrit: 42.5 % (ref 34.0–46.6)
Hemoglobin: 14.1 g/dL (ref 11.1–15.9)
MCH: 32.5 pg (ref 26.6–33.0)
MCHC: 33.2 g/dL (ref 31.5–35.7)
MCV: 98 fL — ABNORMAL HIGH (ref 79–97)
Platelets: 222 10*3/uL (ref 150–450)
RBC: 4.34 x10E6/uL (ref 3.77–5.28)
RDW: 13 % (ref 12.3–15.4)
WBC: 4.3 10*3/uL (ref 3.4–10.8)

## 2017-08-26 LAB — CYTOLOGY - PAP
Diagnosis: NEGATIVE
HPV: NOT DETECTED

## 2017-09-20 ENCOUNTER — Other Ambulatory Visit: Payer: Self-pay | Admitting: Obstetrics and Gynecology

## 2017-09-20 ENCOUNTER — Telehealth: Payer: Self-pay | Admitting: Obstetrics and Gynecology

## 2017-09-20 MED ORDER — VENLAFAXINE HCL ER 75 MG PO CP24: 75.0000 mg | ORAL_CAPSULE | Freq: Every day | ORAL | 3 refills | Status: DC

## 2017-09-20 MED ORDER — VENLAFAXINE HCL ER 37.5 MG PO CP24: 37.5000 mg | ORAL_CAPSULE | Freq: Every day | ORAL | 3 refills | Status: DC

## 2017-09-20 MED ORDER — LORAZEPAM 0.5 MG PO TABS: 0.5000 mg | ORAL_TABLET | Freq: Every day | ORAL | 0 refills | Status: DC

## 2017-09-20 NOTE — Telephone Encounter (Signed)
Pt experiencing insomnia and stress related to life change, now unable to sleep, and concerned over depression, no SI or HI. Pt Rx'd effexor and Ativan hs to address stress.

## 2017-09-23 ENCOUNTER — Other Ambulatory Visit: Payer: Self-pay | Admitting: *Deleted

## 2017-09-23 MED ORDER — SERTRALINE HCL 25 MG PO TABS: 25.0000 mg | ORAL_TABLET | Freq: Every day | ORAL | 1 refills | Status: DC

## 2017-10-01 ENCOUNTER — Other Ambulatory Visit: Payer: Self-pay | Admitting: Obstetrics and Gynecology

## 2017-10-01 MED ORDER — ZOLPIDEM TARTRATE 5 MG PO TABS: 5.0000 mg | ORAL_TABLET | Freq: Every evening | ORAL | 1 refills | Status: DC | PRN

## 2017-10-01 NOTE — Progress Notes (Signed)
Patient sleeping very poorly on Ativan.  Will d/c ativan, and switch to Ambien for sleep. Ativan 5 escribed . Patient aware.

## 2017-10-14 ENCOUNTER — Other Ambulatory Visit: Payer: Self-pay | Admitting: Obstetrics and Gynecology

## 2017-10-14 DIAGNOSIS — Z1231 Encounter for screening mammogram for malignant neoplasm of breast: Secondary | ICD-10-CM

## 2017-11-06 ENCOUNTER — Other Ambulatory Visit: Payer: Self-pay

## 2017-11-06 ENCOUNTER — Ambulatory Visit
Admission: RE | Admit: 2017-11-06 | Discharge: 2017-11-06 | Disposition: A | Discharge status: Home / Self Care | Payer: BC Managed Care – PPO | Source: Ambulatory Visit | Attending: Obstetrics and Gynecology | Admitting: Obstetrics and Gynecology

## 2017-11-06 ENCOUNTER — Other Ambulatory Visit: Payer: Self-pay | Admitting: Obstetrics and Gynecology

## 2017-11-06 DIAGNOSIS — N63 Unspecified lump in unspecified breast: Secondary | ICD-10-CM

## 2017-11-06 DIAGNOSIS — Z1231 Encounter for screening mammogram for malignant neoplasm of breast: Secondary | ICD-10-CM

## 2017-11-14 ENCOUNTER — Other Ambulatory Visit: Payer: Self-pay | Admitting: Obstetrics and Gynecology

## 2017-11-14 ENCOUNTER — Ambulatory Visit
Admission: RE | Admit: 2017-11-14 | Discharge: 2017-11-14 | Disposition: A | Discharge status: Home / Self Care | Payer: BC Managed Care – PPO | Source: Ambulatory Visit | Attending: Obstetrics and Gynecology | Admitting: Obstetrics and Gynecology

## 2017-11-14 ENCOUNTER — Ambulatory Visit: Payer: BC Managed Care – PPO

## 2017-11-14 DIAGNOSIS — N63 Unspecified lump in unspecified breast: Secondary | ICD-10-CM

## 2017-11-26 ENCOUNTER — Telehealth: Payer: Self-pay | Admitting: Obstetrics and Gynecology

## 2017-11-26 MED ORDER — SERTRALINE HCL 25 MG PO TABS
25.0000 mg | ORAL_TABLET | Freq: Every day | ORAL | 5 refills | Status: DC
Start: 2017-11-26 — End: 2018-06-01

## 2017-11-26 NOTE — Telephone Encounter (Signed)
Text message from pt:  She is doing well on Zoloft 25 mg q d plus diphenhydramine for sleep prn. Refil zoloft 25 mg x 6 months.

## 2018-06-01 ENCOUNTER — Other Ambulatory Visit: Payer: Self-pay | Admitting: Obstetrics and Gynecology

## 2018-06-01 MED ORDER — SERTRALINE HCL 25 MG PO TABS: 25.0000 mg | ORAL_TABLET | Freq: Every day | ORAL | 1 refills | Status: DC

## 2018-06-01 NOTE — Progress Notes (Signed)
Pt contacted office re: refil of meds. Currently pt is under stresses of estrangement from long term partner, and Covid 19 concerns, feels that now is not time to go off Sertraline. Will continue x 6 mos more and reassess.

## 2018-06-23 ENCOUNTER — Ambulatory Visit: Payer: BC Managed Care – PPO | Admitting: Obstetrics and Gynecology

## 2018-06-23 ENCOUNTER — Encounter: Payer: Self-pay | Admitting: *Deleted

## 2018-06-24 ENCOUNTER — Other Ambulatory Visit: Payer: Self-pay

## 2018-06-24 ENCOUNTER — Ambulatory Visit: Payer: BC Managed Care – PPO | Admitting: Obstetrics and Gynecology

## 2018-06-24 ENCOUNTER — Encounter: Payer: Self-pay | Admitting: Obstetrics and Gynecology

## 2018-06-24 ENCOUNTER — Ambulatory Visit (INDEPENDENT_AMBULATORY_CARE_PROVIDER_SITE_OTHER): Payer: BC Managed Care – PPO | Admitting: Obstetrics and Gynecology

## 2018-06-24 VITALS — Ht 70.5 in | Wt 112.0 lb

## 2018-06-24 DIAGNOSIS — F439 Reaction to severe stress, unspecified: Secondary | ICD-10-CM | POA: Diagnosis not present

## 2018-06-24 DIAGNOSIS — Z79899 Other long term (current) drug therapy: Secondary | ICD-10-CM | POA: Diagnosis not present

## 2018-06-24 NOTE — Progress Notes (Signed)
Patient ID: Tara Jensen, female   DOB: 06/06/1965, 53 y.o.   MRN: [id number]    TELEHEALTH VIRTUAL GYNECOLOGY VISIT ENCOUNTER NOTE  I connected with Tara Jensen on 06/24/2018 at  3:15 PM EDT by telephone at home and verified that I am speaking with the correct person using two identifiers.   I discussed the limitations, risks, security and privacy concerns of performing an evaluation and management service by telephone and the availability of in person appointments. I also discussed with the patient that there may be a patient responsible charge related to this service. The patient expressed understanding and agreed to proceed.   History:  Tara Jensen is a 53 y.o. G1P1 female being evaluated today for medication management. She wants to continue the zoloft. Long term partner moved out during January and they are no longer together.  For trust issues involving financial secrets etc. she believes that the medication has helped her tremendously while switching jobs and was having a difficult time at that time. Son has decided to enlist in [employer] after recently graduating and is a bit sad but proud. She denies any abnormal vaginal discharge, bleeding, pelvic pain or other concerns.      Past Medical History:  Diagnosis Date  . Abnormal Pap smear   . BV (bacterial vaginosis)   . Cervical polyp   . Recurrent UTI (urinary tract infection)    Past Surgical History:  Procedure Laterality Date  . CERVICAL CONE BIOPSY     The following portions of the patient's history were reviewed and updated as appropriate: allergies, current medications, past family history, past medical history, past social history, past surgical history and problem list.   Health Maintenance:  Normal pap and negative HRHPV on 08/25/2017.  Normal mammogram on 10/25/2016.   Review of Systems:  Pertinent items noted in HPI and remainder of comprehensive ROS otherwise negative.  Physical Exam:   General:  Alert,  oriented and cooperative.   Mental Status: Normal mood and affect perceived. Normal judgment and thought content.  Physical exam deferred due to nature of the encounter  Labs and Imaging No results found for this or any previous visit (from the past 336 hour(s)). No results found.    Assessment and Plan:  A: Emotional/situational distress    P: Continue Rx Zoloft for at least 6 more months, patient may choose to taper off after life is considered "stable" F/u PRN  I discussed the assessment and treatment plan with the patient. The patient was provided an opportunity to ask questions and all were answered. The patient agreed with the plan and demonstrated an understanding of the instructions.   The patient was advised to call back or seek an in-person evaluation/go to the ED if the symptoms worsen or if the condition fails to improve as anticipated.  I provided 12 minutes of non-face-to-face time during this encounter.   By signing my name below, I, Samul Dada, attest that this documentation has been prepared under the direction and in the presence of Jonnie Kind, MD. Electronically Signed: [place]. 06/24/18. 2:55 PM.  I personally performed the services described in this documentation, which was SCRIBED in my presence. The recorded information has been reviewed and considered accurate. It has been edited as necessary during review. Jonnie Kind, MD

## 2018-08-03 ENCOUNTER — Other Ambulatory Visit: Payer: BC Managed Care – PPO

## 2018-08-03 ENCOUNTER — Telehealth: Payer: Self-pay | Admitting: *Deleted

## 2018-08-03 DIAGNOSIS — Z20822 Contact with and (suspected) exposure to covid-19: Secondary | ICD-10-CM

## 2018-08-03 NOTE — Telephone Encounter (Signed)
Discussed with Oliwia and she would like to do testing in [place]. Please do covid 19 testing in [place]. She will be coming with her son Music therapist. I will send message on him also. Please schedule together. Barbara phone number is [phone].

## 2018-08-03 NOTE — Telephone Encounter (Signed)
Spoke with patient.  Scheduled her for COVID 19 testing today at 2:15pm at [facility].  Patient notified to remain in car and wear mask.

## 2018-08-03 NOTE — Telephone Encounter (Signed)
Message copied from pt's son chart per dr Nicki Reaper may have referral for COVID testing-it is fine to do referral for the patient and also if Tara Jensen was around them as well she can do the testing Please explain to them out this process works And that the testing results can take 3 to 5 days to come back During that time anyone who is been tested for COVID should essentially self isolate until test results are back Certainly if they are having any signs or symptoms of illness I would recommend video visit

## 2018-08-08 LAB — NOVEL CORONAVIRUS, NAA: SARS-CoV-2, NAA: NOT DETECTED

## 2018-08-26 ENCOUNTER — Other Ambulatory Visit: Payer: BC Managed Care – PPO | Admitting: Obstetrics and Gynecology

## 2018-09-01 ENCOUNTER — Other Ambulatory Visit: Payer: Self-pay

## 2018-09-01 DIAGNOSIS — Z20822 Contact with and (suspected) exposure to covid-19: Secondary | ICD-10-CM

## 2018-09-02 ENCOUNTER — Ambulatory Visit (INDEPENDENT_AMBULATORY_CARE_PROVIDER_SITE_OTHER): Payer: BC Managed Care – PPO | Admitting: Obstetrics and Gynecology

## 2018-09-02 ENCOUNTER — Other Ambulatory Visit (HOSPITAL_COMMUNITY)
Admission: RE | Admit: 2018-09-02 | Discharge: 2018-09-02 | Disposition: A | Discharge status: Home / Self Care | Payer: BC Managed Care – PPO | Source: Ambulatory Visit | Attending: Certified Registered Nurse Anesthetist | Admitting: Certified Registered Nurse Anesthetist

## 2018-09-02 ENCOUNTER — Encounter: Payer: Self-pay | Admitting: Obstetrics and Gynecology

## 2018-09-02 ENCOUNTER — Other Ambulatory Visit: Payer: Self-pay

## 2018-09-02 VITALS — BP 100/63 | HR 73 | Ht 70.5 in | Wt 110.0 lb

## 2018-09-02 DIAGNOSIS — Z01419 Encounter for gynecological examination (general) (routine) without abnormal findings: Secondary | ICD-10-CM | POA: Insufficient documentation

## 2018-09-02 NOTE — Progress Notes (Signed)
Patient ID: Tara Jensen, female   DOB: 1965/03/28, 53 y.o.   MRN: [id number]  Assessment:  Annual Gyn Exam Life situational stressors Plan:  1. pap smear done, next pap due 1 year 2. return annually or prn 3    Annual mammogram advised after age 61 Subjective:  Tara Jensen is a 53 y.o. female G1P1 who presents for annual exam. No LMP recorded. The patient has complaints today of none. Has been better with situational life stressors. Son almost into deadly car accident and she feels she should have been able to tell that her son was making bad personal choices.. Her son graduated early and has now moved back home.  The following portions of the patient's history were reviewed and updated as appropriate: allergies, current medications, past family history, past medical history, past social history, past surgical history and problem list. Past Medical History:  Diagnosis Date  . Abnormal Pap smear   . BV (bacterial vaginosis)   . Cervical polyp   . Recurrent UTI (urinary tract infection)     Past Surgical History:  Procedure Laterality Date  . CERVICAL CONE BIOPSY       Current Outpatient Medications:  .  etonogestrel-ethinyl estradiol (NUVARING) 0.12-0.015 MG/24HR vaginal ring, Insert vaginally and leave in place for 3 consecutive weeks, then remove for 1 week., Disp: 1 each, Rfl: 12 .  sertraline (ZOLOFT) 25 MG tablet, Take 1 tablet (25 mg total) by mouth daily., Disp: 90 tablet, Rfl: 1  Review of Systems Constitutional: negative Gastrointestinal: negative Genitourinary: normal  Objective:  There were no vitals taken for this visit.   BMI: There is no height or weight on file to calculate BMI.  General Appearance: Alert, appropriate appearance for age. No acute distress HEENT: Grossly normal Neck / Thyroid:  Cardiovascular: RRR; normal S1, S2, no murmur Lungs: CTA bilaterally Back: No CVAT Breast Exam: No masses or nodes.No dimpling, nipple retraction or  discharge. Gastrointestinal: Soft, non-tender, no masses or organomegaly Pelvic Exam:   VAGINA: normal appearing vagina  CERVIX: normal appearing cervix without discharge or lesions,  UTERUS: uterus is firm, nuvaring palpated  PAP: Pap smear done today. Lymphatic Exam: Non-palpable nodes in neck, clavicular, axillary, or inguinal regions  Skin: no rash or abnormalities Neurologic: Normal gait and speech, no tremor  Psychiatric: Alert and oriented, appropriate affect.  Urinalysis:Not done  By signing my name below, I, Samul Dada, attest that this documentation has been prepared under the direction and in the presence of Jonnie Kind, MD. Electronically Signed: [place]. 09/02/18. 9:52 AM.  I personally performed the services described in this documentation, which was SCRIBED in my presence. The recorded information has been reviewed and considered accurate. It has been edited as necessary during review. Jonnie Kind, MD

## 2018-09-03 LAB — NOVEL CORONAVIRUS, NAA: SARS-CoV-2, NAA: NOT DETECTED

## 2018-09-04 LAB — CYTOLOGY - PAP
Adequacy: ABSENT
Diagnosis: NEGATIVE
HPV: NOT DETECTED

## 2018-10-10 ENCOUNTER — Other Ambulatory Visit: Payer: Self-pay | Admitting: Obstetrics and Gynecology

## 2018-10-12 ENCOUNTER — Other Ambulatory Visit: Payer: Self-pay | Admitting: *Deleted

## 2018-10-12 MED ORDER — ETONOGESTREL-ETHINYL ESTRADIOL 0.12-0.015 MG/24HR VA RING: VAGINAL_RING | VAGINAL | 12 refills | Status: DC

## 2018-10-15 ENCOUNTER — Other Ambulatory Visit: Payer: Self-pay | Admitting: Obstetrics and Gynecology

## 2018-10-15 DIAGNOSIS — Z1231 Encounter for screening mammogram for malignant neoplasm of breast: Secondary | ICD-10-CM

## 2018-12-01 ENCOUNTER — Other Ambulatory Visit: Payer: Self-pay

## 2018-12-01 ENCOUNTER — Ambulatory Visit
Admission: RE | Admit: 2018-12-01 | Discharge: 2018-12-01 | Disposition: A | Discharge status: Home / Self Care | Payer: BC Managed Care – PPO | Source: Ambulatory Visit | Attending: Obstetrics and Gynecology | Admitting: Obstetrics and Gynecology

## 2018-12-01 DIAGNOSIS — Z1231 Encounter for screening mammogram for malignant neoplasm of breast: Secondary | ICD-10-CM

## 2018-12-16 ENCOUNTER — Other Ambulatory Visit: Payer: Self-pay | Admitting: Obstetrics and Gynecology

## 2018-12-17 NOTE — Telephone Encounter (Signed)
refil Sertraline 25 mg x 90 tabs with refils.

## 2019-01-20 ENCOUNTER — Other Ambulatory Visit: Payer: Self-pay

## 2019-01-20 DIAGNOSIS — Z20822 Contact with and (suspected) exposure to covid-19: Secondary | ICD-10-CM

## 2019-01-22 LAB — NOVEL CORONAVIRUS, NAA: SARS-CoV-2, NAA: NOT DETECTED

## 2019-02-02 ENCOUNTER — Ambulatory Visit: Payer: BC Managed Care – PPO | Attending: Internal Medicine

## 2019-02-02 DIAGNOSIS — Z20822 Contact with and (suspected) exposure to covid-19: Secondary | ICD-10-CM

## 2019-02-04 LAB — NOVEL CORONAVIRUS, NAA: SARS-CoV-2, NAA: NOT DETECTED

## 2019-03-08 ENCOUNTER — Ambulatory Visit: Payer: BC Managed Care – PPO | Attending: Internal Medicine

## 2019-03-08 DIAGNOSIS — Z20822 Contact with and (suspected) exposure to covid-19: Secondary | ICD-10-CM

## 2019-03-09 LAB — NOVEL CORONAVIRUS, NAA: SARS-CoV-2, NAA: NOT DETECTED

## 2019-08-02 ENCOUNTER — Encounter: Payer: Self-pay | Admitting: Internal Medicine

## 2019-08-06 ENCOUNTER — Telehealth: Payer: Self-pay | Admitting: Obstetrics and Gynecology

## 2019-08-06 NOTE — Telephone Encounter (Signed)
Would like to know what procedure she had done in back 1995 Dr. Earlie Server for abnormal cells.

## 2019-08-06 NOTE — Telephone Encounter (Signed)
Telephoned patient at home number and advised patient unsure of what procedure was done in 1995. Patient voiced understanding.

## 2019-09-06 ENCOUNTER — Other Ambulatory Visit: Payer: Self-pay

## 2019-09-06 ENCOUNTER — Ambulatory Visit (AMBULATORY_SURGERY_CENTER): Payer: Self-pay | Admitting: *Deleted

## 2019-09-06 VITALS — Ht 70.5 in | Wt 118.0 lb

## 2019-09-06 DIAGNOSIS — Z1211 Encounter for screening for malignant neoplasm of colon: Secondary | ICD-10-CM

## 2019-09-06 NOTE — Progress Notes (Signed)
Completed covid vaccines 04-26-19  Pt is aware that care partner will wait in the car during procedure; if they feel like they will be too hot or cold to wait in the car; they may wait in the 4 th floor lobby. Patient is aware to bring only one care partner. We want them to wear a mask (we do not have any that we can provide them), practice social distancing, and we will check their temperatures when they get here.  I did remind the patient that their care partner needs to stay in the parking lot the entire time and have a cell phone available, we will call them when the pt is ready for discharge. Patient will wear mask into building.  plenvu sample given   No egg or soy allergy  No home oxygen use   No medications for weight loss taken  emmi information given  Pt denies constipation issues  No trouble with anesthesia, moving neck or fam hx of malignant hyperthermia

## 2019-09-07 ENCOUNTER — Encounter: Payer: Self-pay | Admitting: Internal Medicine

## 2019-09-07 NOTE — Progress Notes (Signed)
PATIENT ID: Tara Jensen, female     DOB: Mar 11, 1965, 54 y.o.     MRN: [id number]   [facility] Visit  09/07/19     PATIENT NAME: Tara Jensen     MRN [id number]     DOB: [birth year]  CC & HPI:  Tara Jensen is a 54 y.o. female presenting today for an annual exam.  She has requested annual Paps and has had annual normal Pap since 2012 or earlier.  She has been stable on NuvaRing, for several years..  Mammograms have been normal annually since at least 2012.   ROS:  Review of Systems  Constitutional: Negative.   HENT: Negative.   Eyes: Negative.   Respiratory: Negative.   Cardiovascular: Negative.   Gastrointestinal: Negative.   Genitourinary: Negative.   Musculoskeletal: Negative.   Skin: Negative.   Neurological: Negative.   Endo/Heme/Allergies: Negative.   Psychiatric/Behavioral: Negative.   All other systems reviewed and are negative.    Gynecologic History:  No LMP recorded. (Menstrual status: Other). Contraception: NuvaRing vaginal inserts Last Pap: 09/02/2018. Results were: normal Last mammogram: 12/01/2018. Results were: normal  Pertinent History Reviewed:  Reviewed: Significant for BMI less than 17 Medical         Past Medical History:  Diagnosis Date  . Abnormal Pap smear   . Anxiety    "situational"  . BV (bacterial vaginosis)   . Cervical polyp   . Depression    "situational"  . Recurrent UTI (urinary tract infection)                               Surgical Hx:    Past Surgical History:  Procedure Laterality Date  . CERVICAL CONE BIOPSY    . LASIK    Colposcopy by Dr. Daivd Council in the 80s or early 90s with multiple normal Paps.  I do not think she actually had a cone biopsy Medications: Reviewed & Updated - see associated section                       Current Outpatient Medications:  .  BIOTIN PO, Take 2,500 mcg by mouth daily., Disp: , Rfl:  .  diphenhydrAMINE (BENADRYL) 25 mg capsule, Take 25 mg by mouth at bedtime as  needed., Disp: , Rfl:  .  etonogestrel-ethinyl estradiol (NUVARING) 0.12-0.015 MG/24HR vaginal ring, PLACE RING VAGINALLY AND WEAR FOR 21 DAYS. REMOVE FOR 7 DAYS THEN REPEAT WITH NEW RING., Disp: 1 each, Rfl: 12 .  sertraline (ZOLOFT) 25 MG tablet, TAKE 1 TABLET ONCE DAILY., Disp: 90 tablet, Rfl: 3   Social History: Reviewed -  reports that she has never smoked. She has never used smokeless tobacco.  OB History  Gravida Para Term Preterm AB Living  1 1       1   SAB TAB Ectopic Multiple Live Births          1    # Outcome Date GA Lbr Len/2nd Weight Sex Delivery Anes PTL Lv  1 Para 1998    M Vag-Spont   LIV     Objective Findings:  Vitals: There were no vitals taken for this visit.  PHYSICAL EXAMINATION General appearance - alert, well appearing, and in no distress, oriented to person, place, and time and normal appearing weight Mental status - alert, oriented to person, place, and time Chest - not examined Heart - normal rate and  regular rhythm Abdomen - soft, nontender, nondistended, no masses or organomegaly Breasts - breasts appear normal, no suspicious masses, no skin or nipple changes or axillary nodes Skin - normal coloration and turgor, no rashes, no suspicious skin lesions noted  PELVIC External genitalia -normal female Vulva -  Vagina -NuvaRing in place, physiologic secretions Cervix -tiny multiparous  Uterus -retroverted retroflexed normal size Adnexa -nontender [place] -not applicable Rectal - normal rectal, no masses, no rectal performed as patient has colonoscopy scheduled  Assessment & Plan:   A:  1. Well woman exam with GYN exam  P:  1. Baseline labs again including ABO Rh Pap smear collected Patient will consider checking Davis value in the future on day 7 without any NuvaRing in place, after August   By signing my name below, I, [employer], attest that this documentation has been prepared under the direction and in the presence of Jonnie Kind,  MD. Electronically Signed: [place]. 09/07/19. 10:30 PM.  I personally performed the services described in this documentation, which was SCRIBED in my presence. The recorded information has been reviewed and considered accurate. It has been edited as necessary during review. Jonnie Kind, MD

## 2019-09-08 ENCOUNTER — Other Ambulatory Visit (HOSPITAL_COMMUNITY)
Admission: RE | Admit: 2019-09-08 | Discharge: 2019-09-08 | Disposition: A | Discharge status: Home / Self Care | Payer: BC Managed Care – PPO | Source: Ambulatory Visit | Attending: Obstetrics and Gynecology | Admitting: Obstetrics and Gynecology

## 2019-09-08 ENCOUNTER — Ambulatory Visit (INDEPENDENT_AMBULATORY_CARE_PROVIDER_SITE_OTHER): Payer: BC Managed Care – PPO | Admitting: Obstetrics and Gynecology

## 2019-09-08 ENCOUNTER — Encounter: Payer: Self-pay | Admitting: Obstetrics and Gynecology

## 2019-09-08 VITALS — BP 88/58 | HR 76 | Ht 70.5 in | Wt 116.6 lb

## 2019-09-08 DIAGNOSIS — Z01419 Encounter for gynecological examination (general) (routine) without abnormal findings: Secondary | ICD-10-CM | POA: Diagnosis not present

## 2019-09-08 NOTE — Addendum Note (Signed)
Addended by: Linton Rump on: 09/08/2019 09:57 AM   Modules accepted: Orders

## 2019-09-09 LAB — COMPREHENSIVE METABOLIC PANEL
ALT: 10 IU/L (ref 0–32)
AST: 15 IU/L (ref 0–40)
Albumin/Globulin Ratio: 1.6 (ref 1.2–2.2)
Albumin: 4.7 g/dL (ref 3.8–4.9)
Alkaline Phosphatase: 55 IU/L (ref 48–121)
BUN/Creatinine Ratio: 22 (ref 9–23)
BUN: 22 mg/dL (ref 6–24)
Bilirubin Total: 0.4 mg/dL (ref 0.0–1.2)
CO2: 22 mmol/L (ref 20–29)
Calcium: 9.3 mg/dL (ref 8.7–10.2)
Chloride: 103 mmol/L (ref 96–106)
Creatinine, Ser: 0.99 mg/dL (ref 0.57–1.00)
GFR calc Af Amer: 76 mL/min/{1.73_m2} (ref >59)
GFR calc non Af Amer: 66 mL/min/{1.73_m2} (ref >59)
Globulin, Total: 2.9 g/dL (ref 1.5–4.5)
Glucose: 82 mg/dL (ref 65–99)
Potassium: 4.5 mmol/L (ref 3.5–5.2)
Sodium: 140 mmol/L (ref 134–144)
Total Protein: 7.6 g/dL (ref 6.0–8.5)

## 2019-09-09 LAB — CBC
Hematocrit: 40.5 % (ref 34.0–46.6)
Hemoglobin: 13.2 g/dL (ref 11.1–15.9)
MCH: 31.7 pg (ref 26.6–33.0)
MCHC: 32.6 g/dL (ref 31.5–35.7)
MCV: 97 fL (ref 79–97)
Platelets: 256 10*3/uL (ref 150–450)
RBC: 4.17 x10E6/uL (ref 3.77–5.28)
RDW: 12.1 % (ref 11.7–15.4)
WBC: 4.9 10*3/uL (ref 3.4–10.8)

## 2019-09-09 LAB — LIPID PANEL
Chol/HDL Ratio: 2.7 ratio (ref 0.0–4.4)
Cholesterol, Total: 234 mg/dL — ABNORMAL HIGH (ref 100–199)
HDL: 86 mg/dL (ref >39)
LDL Chol Calc (NIH): 128 mg/dL — ABNORMAL HIGH (ref 0–99)
Triglycerides: 115 mg/dL (ref 0–149)
VLDL Cholesterol Cal: 20 mg/dL (ref 5–40)

## 2019-09-09 LAB — CYTOLOGY - PAP
Comment: NEGATIVE
Diagnosis: NEGATIVE
High risk HPV: NEGATIVE

## 2019-09-09 LAB — TSH: TSH: 1.07 u[IU]/mL (ref 0.450–4.500)

## 2019-09-09 LAB — ABO/RH: Rh Factor: NEGATIVE

## 2019-09-13 NOTE — Progress Notes (Signed)
Cholesterol total is slightly high, but the HDL ("good" cholesterol ) is amazingly high which far outweights the minimal elevation for the LDL.

## 2019-09-20 ENCOUNTER — Encounter: Payer: BC Managed Care – PPO | Admitting: Internal Medicine

## 2019-12-06 ENCOUNTER — Other Ambulatory Visit: Payer: Self-pay | Admitting: Obstetrics & Gynecology

## 2019-12-06 DIAGNOSIS — Z1231 Encounter for screening mammogram for malignant neoplasm of breast: Secondary | ICD-10-CM

## 2020-01-11 ENCOUNTER — Other Ambulatory Visit: Payer: Self-pay

## 2020-01-11 ENCOUNTER — Ambulatory Visit
Admission: RE | Admit: 2020-01-11 | Discharge: 2020-01-11 | Disposition: A | Discharge status: Home / Self Care | Payer: BC Managed Care – PPO | Source: Ambulatory Visit | Attending: Obstetrics & Gynecology | Admitting: Obstetrics & Gynecology

## 2020-01-11 DIAGNOSIS — Z1231 Encounter for screening mammogram for malignant neoplasm of breast: Secondary | ICD-10-CM

## 2020-02-07 ENCOUNTER — Other Ambulatory Visit: Payer: Self-pay | Admitting: *Deleted

## 2020-02-07 MED ORDER — SERTRALINE HCL 25 MG PO TABS: 25.0000 mg | ORAL_TABLET | Freq: Every day | ORAL | 3 refills | Status: DC

## 2020-07-27 ENCOUNTER — Encounter: Payer: Self-pay | Admitting: Internal Medicine

## 2020-11-03 ENCOUNTER — Other Ambulatory Visit: Payer: Self-pay | Admitting: Obstetrics and Gynecology

## 2020-11-03 DIAGNOSIS — Z1231 Encounter for screening mammogram for malignant neoplasm of breast: Secondary | ICD-10-CM

## 2020-11-09 ENCOUNTER — Encounter: Payer: BC Managed Care – PPO | Admitting: Internal Medicine

## 2020-11-24 ENCOUNTER — Ambulatory Visit (AMBULATORY_SURGERY_CENTER): Payer: BC Managed Care – PPO | Admitting: *Deleted

## 2020-11-24 ENCOUNTER — Encounter: Payer: Self-pay | Admitting: Internal Medicine

## 2020-11-24 VITALS — Ht 70.5 in | Wt 118.0 lb

## 2020-11-24 DIAGNOSIS — Z1211 Encounter for screening for malignant neoplasm of colon: Secondary | ICD-10-CM

## 2020-11-24 NOTE — Progress Notes (Signed)
Virtual pre visit completed over telephone.  Instructions sent to e-mail [email or web address] and forwarded through [place]. Patient had a Plenvu prep from colonoscopy that was canceled last year.    No egg or soy allergy known to patient  No issues known to pt with past sedation with any surgeries or procedures Patient denies ever being told they had issues or difficulty with intubation  No FH of Malignant Hyperthermia Pt is not on diet pills Pt is not on  home 02  Pt is not on blood thinners  Pt denies issues with constipation  No A fib or A flutter  Pt is fully vaccinated  for Covid    Due to the COVID-19 pandemic we are asking patients to follow certain guidelines in PV and the Colp   Pt aware of COVID protocols and [facility] guidelines

## 2020-12-08 ENCOUNTER — Ambulatory Visit (AMBULATORY_SURGERY_CENTER): Payer: BC Managed Care – PPO | Admitting: Internal Medicine

## 2020-12-08 ENCOUNTER — Encounter: Payer: Self-pay | Admitting: Internal Medicine

## 2020-12-08 ENCOUNTER — Other Ambulatory Visit: Payer: Self-pay

## 2020-12-08 VITALS — BP 99/52 | HR 62 | Temp 97.8°F | Resp 14 | Ht 70.5 in | Wt 118.0 lb

## 2020-12-08 DIAGNOSIS — Z1211 Encounter for screening for malignant neoplasm of colon: Secondary | ICD-10-CM

## 2020-12-08 MED ORDER — SODIUM CHLORIDE 0.9 % IV SOLN: 500.0000 mL | Freq: Once | INTRAVENOUS | Status: DC

## 2020-12-08 NOTE — Patient Instructions (Signed)
Please read handouts provided. Continue present medications. Repeat colonoscopy in 10 years for screening.   YOU HAD AN ENDOSCOPIC PROCEDURE TODAY AT [facility]:   Refer to the procedure report that was given to you for any specific questions about what was found during the examination.  If the procedure report does not answer your questions, please call your gastroenterologist to clarify.  If you requested that your care partner not be given the details of your procedure findings, then the procedure report has been included in a sealed envelope for you to review at your convenience later.  YOU SHOULD EXPECT: Some feelings of bloating in the abdomen. Passage of more gas than usual.  Walking can help get rid of the air that was put into your GI tract during the procedure and reduce the bloating. If you had a lower endoscopy (such as a colonoscopy or flexible sigmoidoscopy) you may notice spotting of blood in your stool or on the toilet paper. If you underwent a bowel prep for your procedure, you may not have a normal bowel movement for a few days.  Please Note:  You might notice some irritation and congestion in your nose or some drainage.  This is from the oxygen used during your procedure.  There is no need for concern and it should clear up in a day or so.  SYMPTOMS TO REPORT IMMEDIATELY:  Following lower endoscopy (colonoscopy or flexible sigmoidoscopy):  Excessive amounts of blood in the stool  Significant tenderness or worsening of abdominal pains  Swelling of the abdomen that is new, acute  Fever of 100F or higher   For urgent or emergent issues, a gastroenterologist can be reached at any hour by calling [phone]. Do not use MyChart messaging for urgent concerns.    DIET:  We do recommend a small meal at first, but then you may proceed to your regular diet.  Drink plenty of fluids but you should avoid alcoholic beverages for 24 hours.  ACTIVITY:  You should  plan to take it easy for the rest of today and you should NOT DRIVE or use heavy machinery until tomorrow (because of the sedation medicines used during the test).    FOLLOW UP: Our staff will call the number listed on your records 48-72 hours following your procedure to check on you and address any questions or concerns that you may have regarding the information given to you following your procedure. If we do not reach you, we will leave a message.  We will attempt to reach you two times.  During this call, we will ask if you have developed any symptoms of COVID 19. If you develop any symptoms (ie: fever, flu-like symptoms, shortness of breath, cough etc.) before then, please call [phone].  If you test positive for Covid 19 in the 2 weeks post procedure, please call and report this information to [place].    If any biopsies were taken you will be contacted by phone or by letter within the next 1-3 weeks.  Please call us at [phone] if you have not heard about the biopsies in 3 weeks.    SIGNATURES/CONFIDENTIALITY: You and/or your care partner have signed paperwork which will be entered into your electronic medical record.  These signatures attest to the fact that that the information above on your After Visit Summary has been reviewed and is understood.  Full responsibility of the confidentiality of this discharge information lies with you and/or your care-partner.

## 2020-12-08 NOTE — Op Note (Signed)
[place] Patient Name: Tara Jensen Procedure Date: 12/08/2020 9:04 AM MRN: [id number] Endoscopist: Docia Chuck. Henrene Pastor , MD Age: 55 Referring MD:  Date of Birth: Mar 29, 1965 Gender: Female Account #: [id number] Procedure:                Colonoscopy Indications:              Screening for colorectal malignant neoplasm Medicines:                Monitored Anesthesia Care Procedure:                Pre-Anesthesia Assessment:                           - Prior to the procedure, a History and Physical                            was performed, and patient medications and                            allergies were reviewed. The patient's tolerance of                            previous anesthesia was also reviewed. The risks                            and benefits of the procedure and the sedation                            options and risks were discussed with the patient.                            All questions were answered, and informed consent                            was obtained. Prior Anticoagulants: The patient has                            taken no previous anticoagulant or antiplatelet                            agents. ASA Grade Assessment: I - A normal, healthy                            patient. After reviewing the risks and benefits,                            the patient was deemed in satisfactory condition to                            undergo the procedure.                           After obtaining informed consent, the colonoscope  was passed under direct vision. Throughout the                            procedure, the patient's blood pressure, pulse, and                            oxygen saturations were monitored continuously. The                            CF [id number] #[id number] was introduced through the anus                            and advanced to the the cecum, identified by                            appendiceal orifice and  ileocecal valve. The                            ileocecal valve, appendiceal orifice, and rectum                            were photographed. The quality of the bowel                            preparation was good after vigorous irrigation and                            suctioning of significant quantities of seedlike                            material. At one juncture, the colonoscope was                            irreversibly clogged and required exchange to a new                            colonoscope. The colonoscopy was performed without                            difficulty. The patient tolerated the procedure                            well. The bowel preparation used was SUPREP/tabs                            via split dose instruction. Scope In: 9:20:10 AM Scope Out: 9:52:00 AM Scope Withdrawal Time: 0 hours 25 minutes 31 seconds  Total Procedure Duration: 0 hours 31 minutes 50 seconds  Findings:                 The entire examined colon appeared normal on direct                            and retroflexion views. Complications:  No immediate complications. Estimated blood loss:                            None. Estimated Blood Loss:     Estimated blood loss: none. Impression:               - The entire examined colon is normal on direct and                            retroflexion views.                           - No specimens collected. Recommendation:           - Repeat colonoscopy in 10 years for screening                            purposes.                           - Patient has a contact number available for                            emergencies. The signs and symptoms of potential                            delayed complications were discussed with the                            patient. Return to normal activities tomorrow.                            Written discharge instructions were provided to the                            patient.                            - Resume previous diet.                           - Continue present medications. Docia Chuck. Henrene Pastor, MD 12/08/2020 10:03:16 AM This report has been signed electronically.

## 2020-12-08 NOTE — Progress Notes (Signed)
Pt's states no medical or surgical changes since previsit or office visit. VS assessed by D.T 

## 2020-12-08 NOTE — Progress Notes (Signed)
Pt Drowsy. VSS. To PACU, report to RN. No anesthetic complications noted.  

## 2020-12-08 NOTE — Progress Notes (Signed)
HISTORY OF PRESENT ILLNESS:  Tara Jensen is a 55 y.o. female who presents today for her index screening colonoscopy.  No GI complaints.  No family history of colon cancer  REVIEW OF SYSTEMS:  All non-GI ROS negative except for  Past Medical History:  Diagnosis Date   Abnormal Pap smear    Anxiety    "situational"   BV (bacterial vaginosis)    Cervical polyp    Depression    "situational"   Recurrent UTI (urinary tract infection)     Past Surgical History:  Procedure Laterality Date   CERVICAL CONE BIOPSY  1996   LASIK      Social History Tara Jensen  reports that she has never smoked. She has never used smokeless tobacco. She reports that she does not currently use alcohol. She reports that she does not use drugs.  family history includes Depression in her brother and mother; Hypertension in her maternal grandfather; Stroke in her maternal grandmother.  No Known Allergies     PHYSICAL EXAMINATION:  Vital signs: BP 106/60   Pulse 65   Temp 97.8 F (36.6 C) (Skin)   Resp 14   Ht 5' 10.5" (1.791 m)   Wt 118 lb (53.5 kg)   SpO2 100%   BMI 16.69 kg/m  General: Well-developed, well-nourished, no acute distress HEENT: Sclerae are anicteric, conjunctiva pink. Oral mucosa intact Lungs: Clear Heart: Regular Abdomen: soft, nontender, nondistended, no obvious ascites, no peritoneal signs, normal bowel sounds. No organomegaly. Extremities: No edema Psychiatric: alert and oriented x3. Cooperative     ASSESSMENT:   1.  Colon cancer screening.  Average risk  PLAN:   1.  Screening colonoscopy

## 2020-12-12 ENCOUNTER — Telehealth: Payer: Self-pay | Admitting: *Deleted

## 2020-12-12 NOTE — Telephone Encounter (Signed)
  Follow up Call-  Call back number 12/08/2020  Post procedure Call Back phone  # [phone]  Permission to leave phone message Yes  Some recent data might be hidden    Voicemail came on, but not able to leave message

## 2021-01-11 ENCOUNTER — Inpatient Hospital Stay: Admission: RE | Admit: 2021-01-11 | Payer: Self-pay | Source: Ambulatory Visit

## 2021-03-01 ENCOUNTER — Other Ambulatory Visit: Payer: Self-pay

## 2021-03-01 ENCOUNTER — Ambulatory Visit
Admission: RE | Admit: 2021-03-01 | Discharge: 2021-03-01 | Disposition: A | Discharge status: Home / Self Care | Payer: BC Managed Care – PPO | Source: Ambulatory Visit | Attending: Obstetrics and Gynecology | Admitting: Obstetrics and Gynecology

## 2021-03-01 DIAGNOSIS — Z1231 Encounter for screening mammogram for malignant neoplasm of breast: Secondary | ICD-10-CM

## 2021-07-11 ENCOUNTER — Ambulatory Visit (HOSPITAL_COMMUNITY)
Admission: RE | Admit: 2021-07-11 | Discharge: 2021-07-11 | Disposition: A | Discharge status: Home / Self Care | Payer: BC Managed Care – PPO | Source: Ambulatory Visit | Attending: Gastroenterology | Admitting: Gastroenterology

## 2021-07-11 ENCOUNTER — Other Ambulatory Visit: Payer: Self-pay | Admitting: Gastroenterology

## 2021-07-11 ENCOUNTER — Other Ambulatory Visit (HOSPITAL_COMMUNITY): Payer: Self-pay | Admitting: Gastroenterology

## 2021-07-11 DIAGNOSIS — R1013 Epigastric pain: Secondary | ICD-10-CM | POA: Diagnosis present

## 2021-07-11 DIAGNOSIS — R1033 Periumbilical pain: Secondary | ICD-10-CM | POA: Insufficient documentation

## 2021-07-11 MED ORDER — IOHEXOL 9 MG/ML PO SOLN
ORAL | Status: AC
  Filled 2021-07-11: qty 1000

## 2021-07-11 MED ORDER — IOHEXOL 300 MG/ML  SOLN
100.0000 mL | Freq: Once | INTRAMUSCULAR | Status: AC | PRN
  Administered 2021-07-11: 100 mL via INTRAVENOUS

## 2021-07-12 ENCOUNTER — Telehealth: Payer: Self-pay | Admitting: Gastroenterology

## 2021-07-12 ENCOUNTER — Other Ambulatory Visit: Payer: Self-pay

## 2021-07-12 DIAGNOSIS — K869 Disease of pancreas, unspecified: Secondary | ICD-10-CM

## 2021-07-12 NOTE — Telephone Encounter (Signed)
The pt appt was moved from 8/14 to 7/5.  All new information has been sent to the pt via My Chart.  Left message on machine to call back

## 2021-07-12 NOTE — Telephone Encounter (Signed)
The pt has been scheduled for EGD EUS on 09/24/21 at 730 am at [facility] with [facility]   Left message on machine to call back

## 2021-07-12 NOTE — Telephone Encounter (Signed)
Received a call from Dr. Collene Mares about this patient.  She was previously seen by Dr. Henrene Pastor but had recent work-up by Dr. Collene Mares for increasing abdominal pain and discomfort.  Negative EGD/colonoscopy.  A CT scan was obtained with results as per canopy/PACS.  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  This certainly is concerning for the potential of malignancy though an atypical presentation of pancreatic necrosis could also present like this.  Next step for this patient's evaluation will be EGD/EUS. Dr. Benson Norway is going to be unavailable for the next few weeks thus we have been asked to evaluate for potential EUS. I think it is reasonable that we move forward with EUS. Patty, please contact the patient and let her know that Dr. Collene Mares and I spoke about her CT scan and that we believe the neck step would be evaluation with an endoscopic ultrasound to try and obtain a biopsy of the lesion in the pancreas. I would like to do the procedure next week and we should still have availability on Monday to add this patient or on some other day of the week though Monday would work out great to just continue with my current procedures that are scheduled.  Otherwise any other date is fine. As long as we can accept her insurance, which I think we did as of last year, then lets go ahead and get her set up for an EGD/EUS. No clinic visit needed. Once she is scheduled please let Dr. Collene Mares and I know. Thanks. GM

## 2021-07-12 NOTE — Telephone Encounter (Signed)
The pt was scheduled for 08/15/21 at 10 am at [facility] with GM.  Left message on machine to call back

## 2021-07-13 NOTE — Telephone Encounter (Signed)
EUS scheduled, pt caregiver instructed and medications reviewed.  Patient instructions mailed to home.  Patient to call with any questions or concerns.  Error in the previous notes the appt is 6/5 NOT 7/5.

## 2021-07-14 ENCOUNTER — Encounter: Payer: Self-pay | Admitting: Gastroenterology

## 2021-07-14 NOTE — Progress Notes (Signed)
Documentation of outside records These results will be scanned in the chart  May 2023 H. pylori breath test Negative  Jul 03, 2021 labs WBC 4.7 Hemoglobin/hematocrit 13.1/38.5 Platelets 172 MCV 97 Sodium 144 Potassium 4.2 BUN/creatinine 23/0.67 Calcium 9.8 AST/ALT 22/13 Total bili 0.4 Alk phos 100 Albumin 4.9 Total protein 8.0 Amylase 82 Lipase 85 (upper limit of normal 72)  CT scan previously reviewed  May 2023 EGD by Dr. Collene Mares The examined esophagus and GEJ appear widely patent and normal. Moderate diffuse inflammation characterized by erythema, friability, granularity were found in the entire examined stomach, biopsies performed. The cardia and fundus were otherwise normal on retroflexion. The examined duodenum was normal.  July 13 2021 labs CA [id number] [id number]   Justice Britain, MD [facility] Office # [phone]

## 2021-07-16 ENCOUNTER — Other Ambulatory Visit: Payer: Self-pay | Admitting: *Deleted

## 2021-07-16 ENCOUNTER — Ambulatory Visit (HOSPITAL_COMMUNITY): Payer: BC Managed Care – PPO | Admitting: Certified Registered"

## 2021-07-16 ENCOUNTER — Encounter: Payer: Self-pay | Admitting: *Deleted

## 2021-07-16 ENCOUNTER — Encounter (HOSPITAL_COMMUNITY): Payer: Self-pay | Admitting: Gastroenterology

## 2021-07-16 ENCOUNTER — Ambulatory Visit (HOSPITAL_COMMUNITY)
Admission: RE | Admit: 2021-07-16 | Discharge: 2021-07-16 | Disposition: A | Discharge status: Home / Self Care | Payer: BC Managed Care – PPO | Attending: Gastroenterology | Admitting: Gastroenterology

## 2021-07-16 ENCOUNTER — Encounter (HOSPITAL_COMMUNITY)
Admission: RE | Disposition: A | Discharge status: Home / Self Care | Payer: Self-pay | Source: Home / Self Care | Attending: Gastroenterology

## 2021-07-16 DIAGNOSIS — K8689 Other specified diseases of pancreas: Secondary | ICD-10-CM | POA: Diagnosis not present

## 2021-07-16 DIAGNOSIS — R1013 Epigastric pain: Secondary | ICD-10-CM | POA: Insufficient documentation

## 2021-07-16 DIAGNOSIS — R933 Abnormal findings on diagnostic imaging of other parts of digestive tract: Secondary | ICD-10-CM | POA: Diagnosis not present

## 2021-07-16 DIAGNOSIS — K449 Diaphragmatic hernia without obstruction or gangrene: Secondary | ICD-10-CM | POA: Insufficient documentation

## 2021-07-16 DIAGNOSIS — R978 Other abnormal tumor markers: Secondary | ICD-10-CM | POA: Insufficient documentation

## 2021-07-16 DIAGNOSIS — K2289 Other specified disease of esophagus: Secondary | ICD-10-CM | POA: Diagnosis not present

## 2021-07-16 DIAGNOSIS — K2970 Gastritis, unspecified, without bleeding: Secondary | ICD-10-CM | POA: Diagnosis not present

## 2021-07-16 DIAGNOSIS — C251 Malignant neoplasm of body of pancreas: Secondary | ICD-10-CM | POA: Insufficient documentation

## 2021-07-16 DIAGNOSIS — K3189 Other diseases of stomach and duodenum: Secondary | ICD-10-CM | POA: Diagnosis not present

## 2021-07-16 HISTORY — PX: FINE NEEDLE ASPIRATION: SHX5430

## 2021-07-16 HISTORY — PX: BIOPSY: SHX5522

## 2021-07-16 HISTORY — PX: UPPER ESOPHAGEAL ENDOSCOPIC ULTRASOUND (EUS): SHX6562

## 2021-07-16 HISTORY — PX: ESOPHAGOGASTRODUODENOSCOPY (EGD) WITH PROPOFOL: SHX5813

## 2021-07-16 SURGERY — ESOPHAGOGASTRODUODENOSCOPY (EGD) WITH PROPOFOL
Anesthesia: Monitor Anesthesia Care

## 2021-07-16 MED ORDER — FENTANYL CITRATE (PF) 100 MCG/2ML IJ SOLN: 25.0000 ug | INTRAMUSCULAR | Status: DC | PRN

## 2021-07-16 MED ORDER — CIPROFLOXACIN IN D5W 400 MG/200ML IV SOLN
INTRAVENOUS | Status: AC
  Filled 2021-07-16: qty 200

## 2021-07-16 MED ORDER — LIDOCAINE 2% (20 MG/ML) 5 ML SYRINGE
INTRAMUSCULAR | Status: DC | PRN
  Administered 2021-07-16: 40 mg via INTRAVENOUS

## 2021-07-16 MED ORDER — LACTATED RINGERS IV SOLN
INTRAVENOUS | Status: DC
  Administered 2021-07-16: 1000 mL via INTRAVENOUS

## 2021-07-16 MED ORDER — MIDAZOLAM HCL 5 MG/5ML IJ SOLN
INTRAMUSCULAR | Status: DC | PRN
  Administered 2021-07-16: 2 mg via INTRAVENOUS

## 2021-07-16 MED ORDER — CIPROFLOXACIN HCL 500 MG PO TABS: 500.0000 mg | ORAL_TABLET | Freq: Two times a day (BID) | ORAL | 0 refills | Status: AC

## 2021-07-16 MED ORDER — MIDAZOLAM HCL 2 MG/2ML IJ SOLN
INTRAMUSCULAR | Status: AC
  Filled 2021-07-16: qty 2

## 2021-07-16 MED ORDER — CIPROFLOXACIN IN D5W 400 MG/200ML IV SOLN
INTRAVENOUS | Status: DC | PRN
  Administered 2021-07-16: 400 mg via INTRAVENOUS

## 2021-07-16 MED ORDER — PROPOFOL 500 MG/50ML IV EMUL
INTRAVENOUS | Status: DC | PRN
  Administered 2021-07-16: 150 ug/kg/min via INTRAVENOUS

## 2021-07-16 MED ORDER — PHENYLEPHRINE HCL-NACL 20-0.9 MG/250ML-% IV SOLN
INTRAVENOUS | Status: DC | PRN
  Administered 2021-07-16: 35 ug/min via INTRAVENOUS

## 2021-07-16 SURGICAL SUPPLY — 15 items

## 2021-07-16 NOTE — Anesthesia Preprocedure Evaluation (Addendum)
Anesthesia Evaluation  Patient identified by MRN, date of birth, ID band Patient awake    Reviewed: Allergy & Precautions, NPO status , Patient's Chart, lab work & pertinent test results  Airway Mallampati: II  TM Distance: >3 FB     Dental   Pulmonary neg pulmonary ROS,    breath sounds clear to auscultation       Cardiovascular negative cardio ROS   Rhythm:Regular Rate:Normal     Neuro/Psych negative neurological ROS     GI/Hepatic negative GI ROS, Neg liver ROS,   Endo/Other  History noted Dr. Nyoka Cowden  Renal/GU negative Renal ROS     Musculoskeletal   Abdominal   Peds  Hematology   Anesthesia Other Findings   Reproductive/Obstetrics                             Anesthesia Physical Anesthesia Plan  ASA: 3  Anesthesia Plan: MAC   Post-op Pain Management:    Induction: Intravenous  PONV Risk Score and Plan: Treatment may vary due to age or medical condition and Propofol infusion  Airway Management Planned: Simple Face Mask and Nasal Cannula  Additional Equipment:   Intra-op Plan:   Post-operative Plan:   Informed Consent: I have reviewed the patients History and Physical, chart, labs and discussed the procedure including the risks, benefits and alternatives for the proposed anesthesia with the patient or authorized representative who has indicated his/her understanding and acceptance.     Dental advisory given  Plan Discussed with: CRNA and Anesthesiologist  Anesthesia Plan Comments:        Anesthesia Quick Evaluation

## 2021-07-16 NOTE — Progress Notes (Signed)
Referral entered from Dr Collene Mares per Dr Benay Spice, scheduled for 07/24/21 at [place]

## 2021-07-16 NOTE — Anesthesia Procedure Notes (Signed)
Procedure Name: MAC Date/Time: 07/16/2021 9:01 AM Performed by: Georgia Duff, CRNA Pre-anesthesia Checklist: Patient identified, Emergency Drugs available, Suction available and Patient being monitored Patient Re-evaluated:Patient Re-evaluated prior to induction Oxygen Delivery Method: Nasal cannula Dental Injury: Teeth and Oropharynx as per pre-operative assessment

## 2021-07-16 NOTE — Transfer of Care (Signed)
Immediate Anesthesia Transfer of Care Note  Patient: Tara Jensen  Procedure(s) Performed: ESOPHAGOGASTRODUODENOSCOPY (EGD) WITH PROPOFOL UPPER ESOPHAGEAL ENDOSCOPIC ULTRASOUND (EUS) BIOPSY FINE NEEDLE ASPIRATION (FNA) LINEAR  Patient Location: PACU  Anesthesia Type:MAC  Level of Consciousness: drowsy and patient cooperative  Airway & Oxygen Therapy: Patient Spontanous Breathing and Patient connected to nasal cannula oxygen  Post-op Assessment: Report given to RN and Post -op Vital signs reviewed and stable  Post vital signs: Reviewed and stable  Last Vitals:  Vitals Value Taken Time  BP 101/54 07/16/21 0959  Temp    Pulse 60 07/16/21 1001  Resp 11 07/16/21 1001  SpO2 100 % 07/16/21 1001  Vitals shown include unvalidated device data.  Last Pain:  Vitals:   07/16/21 0836  PainSc: 6       Patients Stated Pain Goal: 0 (16/61/96 9409)  Complications: No notable events documented.

## 2021-07-16 NOTE — Anesthesia Postprocedure Evaluation (Signed)
Anesthesia Post Note  Patient: Tara Jensen  Procedure(s) Performed: ESOPHAGOGASTRODUODENOSCOPY (EGD) WITH PROPOFOL UPPER ESOPHAGEAL ENDOSCOPIC ULTRASOUND (EUS) BIOPSY FINE NEEDLE ASPIRATION (FNA) LINEAR     Patient location during evaluation: PACU Anesthesia Type: MAC Level of consciousness: awake Pain management: pain level controlled Vital Signs Assessment: post-procedure vital signs reviewed and stable Respiratory status: spontaneous breathing Cardiovascular status: stable Postop Assessment: no apparent nausea or vomiting Anesthetic complications: no   No notable events documented.  Last Vitals:  Vitals:   07/16/21 0813 07/16/21 1000  BP: 117/65 (!) 101/54  Pulse: 65 63  Resp: 10 13  Temp:  36.4 C  SpO2: 100% 100%    Last Pain:  Vitals:   07/16/21 1000  PainSc: 0-No pain                 Loredana Medellin

## 2021-07-16 NOTE — Anesthesia Postprocedure Evaluation (Signed)
Anesthesia Post Note  Patient: Tara Jensen  Procedure(s) Performed: ESOPHAGOGASTRODUODENOSCOPY (EGD) WITH PROPOFOL UPPER ESOPHAGEAL ENDOSCOPIC ULTRASOUND (EUS) BIOPSY FINE NEEDLE ASPIRATION (FNA) LINEAR     Patient location during evaluation: Endoscopy Anesthesia Type: MAC Level of consciousness: awake Pain management: pain level controlled Respiratory status: spontaneous breathing Cardiovascular status: stable Postop Assessment: no apparent nausea or vomiting Anesthetic complications: no   No notable events documented.  Last Vitals:  Vitals:   07/16/21 0813  BP: 117/65  Pulse: 65  Resp: 10  SpO2: 100%    Last Pain:  Vitals:   07/16/21 0836  PainSc: 6                  Devean Skoczylas

## 2021-07-16 NOTE — Progress Notes (Signed)
PATIENT NAVIGATOR PROGRESS NOTE  Name: EDRA RICCARDI Date: 07/16/2021 MRN: [id number]  DOB: [birth year]   Reason for visit: Introductory phone call  Comments:  Spoke with Dr Collene Mares and confirmed appt with Dr Benay Spice on 07/24/21 at 0730 for new pt appt    Time spent counseling/coordinating care: 30-45 minutes

## 2021-07-16 NOTE — Anesthesia Postprocedure Evaluation (Signed)
Anesthesia Post Note  Patient: Yvana Samonte Grisanti  Procedure(s) Performed: ESOPHAGOGASTRODUODENOSCOPY (EGD) WITH PROPOFOL UPPER ESOPHAGEAL ENDOSCOPIC ULTRASOUND (EUS) BIOPSY FINE NEEDLE ASPIRATION (FNA) LINEAR     Patient location during evaluation: PACU Anesthesia Type: MAC Level of consciousness: awake Pain management: pain level controlled Vital Signs Assessment: post-procedure vital signs reviewed and stable Respiratory status: spontaneous breathing Cardiovascular status: stable Postop Assessment: no apparent nausea or vomiting Anesthetic complications: no   No notable events documented.  Last Vitals:  Vitals:   07/16/21 0813 07/16/21 1000  BP: 117/65 (!) 101/54  Pulse: 65 63  Resp: 10 13  Temp:  36.4 C  SpO2: 100% 100%    Last Pain:  Vitals:   07/16/21 1000  PainSc: 0-No pain                 Earnestine Tuohey

## 2021-07-16 NOTE — Op Note (Signed)
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 Estimated Blood Loss:     Estimated blood loss was minimal. Procedure:                Pre-Anesthesia Assessment:                           - Prior to the procedure, a History and Physical                            was performed, and patient medications and                            allergies were reviewed. The patient's tolerance of                            previous anesthesia was also reviewed. The risks                            and benefits of the procedure and the sedation                            options and risks were discussed with the patient.                            All questions were answered, and informed consent                            was obtained. Prior Anticoagulants: The patient has                            taken no previous anticoagulant or antiplatelet  agents. ASA Grade Assessment: II - A patient with                            mild systemic disease. After reviewing the risks                            and benefits, the patient was deemed in                            satisfactory condition to undergo the procedure.                           After obtaining informed consent, the endoscope was                             passed under direct vision. Throughout the                            procedure, the patient's blood pressure, pulse, and                            oxygen saturations were monitored continuously. The                            [id number]-H190 ([id number]) Olympus endoscope was introduced                            through the mouth, and advanced to the second part                            of duodenum. The [id number]-Q190V ([id number]) Olympus                            duodenoscope was introduced through the mouth, and                            advanced to the area of papilla. The [id number]                            ([id number]) Olympus linear ultrasound scope was                            introduced through the mouth, and advanced to the                            duodenum for ultrasound examination from the                            stomach and duodenum. After obtaining informed                            consent, the endoscope was passed under direct  vision. Throughout the procedure, the patient's                            blood pressure, pulse, and oxygen saturations were                            monitored continuously.The upper EUS was                            accomplished without difficulty. The patient                            tolerated the procedure. Scope In: Scope Out: Findings:      ENDOSCOPIC FINDING: :      No gross lesions were noted in the entire esophagus.      The Z-line was irregular and was found 41 cm from the incisors.      A 4 cm hiatal hernia was present.      Extrinsic impression on the stomach was found on the posterior wall of       the gastric body.      Patchy mild inflammation characterized by erosions and erythema was       found in the entire examined stomach. Biopsies were taken with a cold       forceps for histology and Helicobacter pylori testing.      No gross lesions were noted in the duodenal bulb, in the  first portion       of the duodenum and in the second portion of the duodenum.      ENDOSONOGRAPHIC FINDING: :      An irregular mass was identified in the pancreatic body. The mass was       mostly hypoechoic but there were areas within the central portion which       were more heterogenous - concerning for possible necrosis. The mass       measured 45 mm by 36 mm in maximal cross-sectional diameter. The outer       margins were irregular. There was sonographic evidence suggesting       invasion into the superior mesenteric artery (manifested by abutment),       the celiac trunk (manifested by abutment) and the splenoportal       confluence (manifested by significant compression of vessel). The       remainder of the pancreas was examined. The endosonographic appearance       of parenchyma and the upstream pancreatic duct indicated duct dilation       (PDT - 2.6 mm) and parenchymal atrophy. The pancreatic duct in the head       measured 1.9 mm, the pancreatic duct in the neck measured 2.2 mm. Fine       needle biopsy was performed of the mass. Color Doppler imaging was       utilized prior to needle puncture to confirm a lack of significant       vascular structures within the needle path. Six passes were made with       the Acquire 22 gauge ultrasound core biopsy needle using a transgastric       approach. Visible cores of tissue were obtained. Preliminary cytologic       examination and touch preps were performed. Final cytology results are  pending.      There was no sign of significant endosonographic abnormality in the       common bile duct (4.8 mm) and in the common hepatic duct. An       unremarkable gallbladder was identified.      No malignant-appearing lymph nodes were visualized in the gastrohepatic       ligament (level 18), celiac region (level 20), peripancreatic region and       porta hepatis region. The previously noted lesion in the periaortic       region is not  able to be visualized on EUS today.      Endosonographic imaging in the visualized portion of the liver showed no       mass.      There were numerous venous collaterals present adjacent to the portal       vein, based on endosonographic examination.      The celiac region was visualized. Impression:               EGD Impression:                           - No gross lesions in esophagus. Z-line irregular,                            41 cm from the incisors.                           - 4 cm hiatal hernia.                           - Extrinsic impression in the gastric body                            (posterior wall).                           - Gastritis. Biopsied.                           - No gross lesions in the duodenal bulb, in the                            first portion of the duodenum and in the second                            portion of the duodenum.                           EUS Impression:                           - A mass was identified in the pancreatic body.                            Cytology results are pending. 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 This was staged T4 N0 Mx by                            endosonographic criteria. The staging applies if                            malignancy is confirmed. Fine needle biopsy                            performed of the mass.                           - No malignant-appearing lymph nodes were                            visualized in the gastrohepatic ligament (level                            18), celiac region (level 20), peripancreatic                            region and porta hepatis region. I could not                            visualize the noted peri-aortic node on the CT on                            today's EUS.                           - If this node is truly positive, then staging for                             this malignancy would transition to T4 N1 Mx.                           - There was no sign of significant pathology in the                            common bile duct and in the common hepatic duct.                           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 Recommendation:           - The patient will be observed post-procedure,                            until all discharge criteria are met.                           -  Discharge patient to home.                           - Patient has a contact number available for                            emergencies. The signs and symptoms of potential                            delayed complications were discussed with the                            patient. Return to normal activities tomorrow.                            Written discharge instructions were provided to the                            patient.                           - Low fat diet for 2 weeks.                           - Observe patient's clinical course.                           - Await cytology results and await path results.                           - Recommend CT-Chest for completion of staging be                            performed.                           - Referral to Oncology and Radiation Oncology will                            be recommended as well, based on final pathology.                           - I will coordinate these referrals/imaging studies                            with Dr. Lorie Apley office or my office once final                            pathology is completed.                           - The findings and recommendations were discussed                            with the patient.                           -  The findings and recommendations were discussed                            with the designated responsible adult. Procedure Code(s):        --- Professional ---                            [phone], Esophagogastroduodenoscopy, flexible,                            transoral; with transendoscopic ultrasound-guided                            intramural or transmural fine needle                            aspiration/biopsy(s), (includes endoscopic                            ultrasound examination limited to the esophagus,                            stomach or duodenum, and adjacent structures) Diagnosis Code(s):        --- Professional ---                           K22.8, Other specified diseases of esophagus                           K44.9, Diaphragmatic hernia without obstruction or                            gangrene                           K31.89, Other diseases of stomach and duodenum                           K29.70, Gastritis, unspecified, without bleeding                           K86.89, Other specified diseases of pancreas                           I89.9, Noninfective disorder of lymphatic vessels                            and lymph nodes, unspecified                           R97.8, Other abnormal tumor markers                           R10.13, Epigastric pain                           R93.3, Abnormal findings on diagnostic imaging of  other parts of digestive tract CPT copyright 2019 [employer]. All rights reserved. The codes documented in this report are preliminary and upon coder review may  be revised to meet current compliance requirements. Justice Britain, MD 07/16/2021 10:17:52 AM Number of Addenda: 0

## 2021-07-16 NOTE — H&P (Signed)
GASTROENTEROLOGY PROCEDURE H&P NOTE   Primary Care Physician: Chesley Noon, MD  HPI: Tara Jensen is a 56 y.o. female who presents for EGD/EUS to evaluate pancreatic abnormality with concern for malignancy and with elevated CA19-9 in setting of weight loss/abdominal pain/back pain.  Past Medical History:  Diagnosis Date   Abnormal Pap smear    Anxiety    "situational"   BV (bacterial vaginosis)    Cervical polyp    Depression    "situational"   Recurrent UTI (urinary tract infection)    Past Surgical History:  Procedure Laterality Date   CERVICAL CONE BIOPSY  1996   LASIK     No current facility-administered medications for this encounter.   No current facility-administered medications for this encounter. No Known Allergies Family History  Problem Relation Age of Onset   Depression Mother    Depression Brother    Stroke Maternal Grandmother    Hypertension Maternal Grandfather    Colon cancer Neg Hx    Esophageal cancer Neg Hx    Rectal cancer Neg Hx    Stomach cancer Neg Hx    Social History   Socioeconomic History   Marital status: Divorced    Spouse name: Not on file   Number of children: Not on file   Years of education: Not on file   Highest education level: Not on file  Occupational History   Not on file  Tobacco Use   Smoking status: Never   Smokeless tobacco: Never  Vaping Use   Vaping Use: Never used  Substance and Sexual Activity   Alcohol use: Not Currently   Drug use: No   Sexual activity: Not Currently    Birth control/protection: Inserts  Other Topics Concern   Not on file  Social History Narrative   Not on file   Social Determinants of Health   Financial Resource Strain: Not on file  Food Insecurity: Not on file  Transportation Needs: Not on file  Physical Activity: Not on file  Stress: Not on file  Social Connections: Not on file  Intimate Partner Violence: Not on file    Physical Exam: Today's Vitals   07/16/21  0836  Weight: 48.5 kg  Height: '5\' 10"'$  (1.778 m)  PainSc: 6    Body mass index is 15.35 kg/m. GEN: NAD EYE: Sclerae anicteric ENT: MMM CV: Non-tachycardic GI: Soft, NT/ND NEURO:  Alert & Oriented x 3  Lab Results: No results for input(s): WBC, HGB, HCT, PLT in the last 72 hours. BMET No results for input(s): NA, K, CL, CO2, GLUCOSE, BUN, CREATININE, CALCIUM in the last 72 hours. LFT No results for input(s): PROT, ALBUMIN, AST, ALT, ALKPHOS, BILITOT, BILIDIR, IBILI in the last 72 hours. PT/INR No results for input(s): LABPROT, INR in the last 72 hours.   Impression / Plan: This is a 56 y.o.female who presents for EGD/EUS to evaluate pancreatic abnormality with concern for malignancy and with elevated CA19-9 in setting of weight loss/abdominal pain/back pain.  The risks of an EUS including intestinal perforation, bleeding, infection, aspiration, and medication effects were discussed as was the possibility it may not give a definitive diagnosis if a biopsy is performed.  When a biopsy of the pancreas is done as part of the EUS, there is an additional risk of pancreatitis at the rate of about 1-2%.  It was explained that procedure related pancreatitis is typically mild, although it can be severe and even life threatening, which is why we do not  perform random pancreatic biopsies and only biopsy a lesion/area we feel is concerning enough to warrant the risk.   The risks and benefits of endoscopic evaluation/treatment were discussed with the patient and/or family; these include but are not limited to the risk of perforation, infection, bleeding, missed lesions, lack of diagnosis, severe illness requiring hospitalization, as well as anesthesia and sedation related illnesses.  The patient's history has been reviewed, patient examined, no change in status, and deemed stable for procedure.  The patient and/or family is agreeable to proceed.    Justice Britain, MD [place]  [facility] Advanced Endoscopy Office # [phone]

## 2021-07-17 ENCOUNTER — Other Ambulatory Visit (HOSPITAL_BASED_OUTPATIENT_CLINIC_OR_DEPARTMENT_OTHER): Payer: Self-pay | Admitting: Gastroenterology

## 2021-07-17 ENCOUNTER — Other Ambulatory Visit: Payer: Self-pay | Admitting: Gastroenterology

## 2021-07-17 ENCOUNTER — Encounter (HOSPITAL_COMMUNITY): Payer: Self-pay | Admitting: Gastroenterology

## 2021-07-17 DIAGNOSIS — Z8507 Personal history of malignant neoplasm of pancreas: Secondary | ICD-10-CM

## 2021-07-17 DIAGNOSIS — R935 Abnormal findings on diagnostic imaging of other abdominal regions, including retroperitoneum: Secondary | ICD-10-CM

## 2021-07-17 LAB — CYTOLOGY - NON PAP

## 2021-07-18 ENCOUNTER — Encounter (HOSPITAL_BASED_OUTPATIENT_CLINIC_OR_DEPARTMENT_OTHER): Payer: Self-pay

## 2021-07-18 ENCOUNTER — Ambulatory Visit (HOSPITAL_BASED_OUTPATIENT_CLINIC_OR_DEPARTMENT_OTHER)
Admission: RE | Admit: 2021-07-18 | Discharge: 2021-07-18 | Disposition: A | Discharge status: Home / Self Care | Payer: BC Managed Care – PPO | Source: Ambulatory Visit | Attending: Gastroenterology | Admitting: Gastroenterology

## 2021-07-18 ENCOUNTER — Encounter: Payer: Self-pay | Admitting: Gastroenterology

## 2021-07-18 DIAGNOSIS — Z8507 Personal history of malignant neoplasm of pancreas: Secondary | ICD-10-CM | POA: Insufficient documentation

## 2021-07-18 DIAGNOSIS — R935 Abnormal findings on diagnostic imaging of other abdominal regions, including retroperitoneum: Secondary | ICD-10-CM | POA: Diagnosis present

## 2021-07-18 LAB — SURGICAL PATHOLOGY

## 2021-07-18 MED ORDER — IOHEXOL 300 MG/ML  SOLN
75.0000 mL | Freq: Once | INTRAMUSCULAR | Status: AC | PRN
  Administered 2021-07-18: 60 mL via INTRAVENOUS

## 2021-07-19 ENCOUNTER — Encounter: Payer: Self-pay | Admitting: *Deleted

## 2021-07-19 NOTE — Progress Notes (Signed)
PATIENT NAVIGATOR PROGRESS NOTE  Name: Tara Jensen Date: 07/19/2021 MRN: [id number]  DOB: Dec 17, 1965   Reason for visit:  Introductory phone call  Comments:  Called and spoke with patient and her aunt regarding New Pt Appt next Tuesday, June 13 at 0730 with Dr Benay Spice, what to expect with appt and contact information   Encouraged her to call with any questions or issues    Time spent counseling/coordinating care: 30-45 minutes

## 2021-07-24 ENCOUNTER — Inpatient Hospital Stay: Payer: BC Managed Care – PPO | Attending: Oncology | Admitting: Oncology

## 2021-07-24 ENCOUNTER — Encounter: Payer: Self-pay | Admitting: *Deleted

## 2021-07-24 ENCOUNTER — Other Ambulatory Visit: Payer: Self-pay | Admitting: *Deleted

## 2021-07-24 DIAGNOSIS — K8689 Other specified diseases of pancreas: Secondary | ICD-10-CM

## 2021-07-24 DIAGNOSIS — C251 Malignant neoplasm of body of pancreas: Secondary | ICD-10-CM | POA: Insufficient documentation

## 2021-07-24 DIAGNOSIS — Z5111 Encounter for antineoplastic chemotherapy: Secondary | ICD-10-CM | POA: Insufficient documentation

## 2021-07-24 DIAGNOSIS — Z79899 Other long term (current) drug therapy: Secondary | ICD-10-CM | POA: Insufficient documentation

## 2021-07-24 NOTE — Progress Notes (Signed)
[facility] New Patient Consult   Requesting MD: Juanita Craver, Md [address] [place],  [place] [address]   Tara Jensen 56 y.o.  02/19/1965    Reason for Consult: Pancreas cancer   HPI: Tara Jensen developed low back discomfort beginning in January of this year.  She reports early satiety beginning in the fall 2022.  She saw her primary provider 07/03/2021.  The lipase was mildly elevated.  She was referred to Dr. Collene Mares. An upper endoscopy 07/11/2021 revealed moderate diffuse inflammation in the stomach.  The duodenum appeared normal.  The esophagus and GE junction appeared normal.   A CT of the abdomen and pelvis on 07/11/2021 revealed no suspicious liver lesion.  An irregular mass was noted in the pancreas body measuring 3.7 x 4.3 cm with occlusion of the SMV and splenic veins.  A filling defect was noted at the distal SMV.  The portal vein is patent.  There is less than 180 degree abutment of the distal celiac, common hepatic, and splenic arteries.  Less than 180 degree abutment of the SMA.  There is atrophy of the pancreas tail.  Upper abdominal varices.  There is a 9 mm left periaortic node.  No ascites.  She was referred to Dr. Rush Landmark and was taken to an upper EUS on 07/16/2021.  Extrinsic impression was noted on the posterior wall of the gastric body.  An irregular mass was noted in the pancreas body with changes concerning for possible necrosis.  The mass measured 45 x 36 mm.  There was evidence of invasion into the SMA, celiac trunk, and splenoportal confluence.  A fine-needle biopsy of the mass was performed.  No malignant appearing lymph nodes.  The lesion was staged as a T4 N0 MX mass by EUS criteria.   Past Medical History:  Diagnosis Date   Abnormal Pap smear    Anxiety    "situational"   BV (bacterial vaginosis)    Cervical polyp    Depression    "situational"   Recurrent UTI (urinary tract infection)    .  G1, P1  Past Surgical  History:  Procedure Laterality Date   BIOPSY  07/16/2021   Procedure: BIOPSY;  Surgeon: Mansouraty, Telford Nab., MD;  Location: [facility];  Service: Gastroenterology;;   CERVICAL CONE BIOPSY  1996   ESOPHAGOGASTRODUODENOSCOPY (EGD) WITH PROPOFOL N/A 07/16/2021   Procedure: ESOPHAGOGASTRODUODENOSCOPY (EGD) WITH PROPOFOL;  Surgeon: Irving Copas., MD;  Location: [facility];  Service: Gastroenterology;  Laterality: N/A;   FINE NEEDLE ASPIRATION  07/16/2021   Procedure: FINE NEEDLE ASPIRATION (FNA) LINEAR;  Surgeon: Irving Copas., MD;  Location: [facility];  Service: Gastroenterology;;   LASIK     UPPER ESOPHAGEAL ENDOSCOPIC ULTRASOUND (EUS) N/A 07/16/2021   Procedure: UPPER ESOPHAGEAL ENDOSCOPIC ULTRASOUND (EUS);  Surgeon: Irving Copas., MD;  Location: [facility];  Service: Gastroenterology;  Laterality: N/A;    .  Wisdom tooth surgery  Medications: Reviewed  Allergies: No Known Allergies  Family history: A paternal great aunt had breast cancer  Social History:   She lives alone in [place].  She is a Animal nutritionist.  She does not use cigarettes or alcohol.  No transfusion history.  ROS:   Positives include: Early satiety, 5 pound weight loss, constipated since taking tramadol, abdomen/back pain, decreased visual acuity  A complete ROS was otherwise negative.  Physical Exam:  There were no vitals taken for this visit.  HEENT: Oropharynx without visible mass, neck  without mass Lungs: Clear bilaterally Cardiac: Regular rate and rhythm Abdomen: No hepatosplenomegaly, mild tenderness in the upper abdomen, no mass, no apparent ascites  Vascular: No leg edema Lymph nodes: No cervical, supraclavicular, axillary, or inguinal nodes Neurologic: Alert and oriented, the motor exam appears intact in the upper and lower extremities bilaterally Skin: No rash Musculoskeletal: No spine tenderness   LAB:  CBC  Lab Results  Component Value Date   WBC 4.9  09/08/2019   HGB 13.2 09/08/2019   HCT 40.5 09/08/2019   MCV 97 09/08/2019   PLT 256 09/08/2019        CMP  Lab Results  Component Value Date   NA 140 09/08/2019   K 4.5 09/08/2019   CL 103 09/08/2019   CO2 22 09/08/2019   GLUCOSE 82 09/08/2019   BUN 22 09/08/2019   CREATININE 0.99 09/08/2019   CALCIUM 9.3 09/08/2019   PROT 7.6 09/08/2019   ALBUMIN 4.7 09/08/2019   AST 15 09/08/2019   ALT 10 09/08/2019   ALKPHOS 55 09/08/2019   BILITOT 0.4 09/08/2019   GFRNONAA 66 09/08/2019   GFRAA 76 09/08/2019    CA 19-9 on 07/12/2021: 5308 Imaging:  As per HPI, CT images from 07/11/2021 reviewed    Assessment/Plan:  Pancreas cancer, CT abdomen/pelvis 07/11/2021-irregular pancreas body mass with occlusion of the proximal splenic vein and SMV, SMV patent distally with small filling defect likely due to thrombus, prominent subcentimeter left periotic lymph node.  Less than 180 degree abutment of the distal celiac, common hepatic, and splenic arteries, less than 180 degree abutment of the SMA CT chest 07/18/2021-no evidence of metastatic disease EUS 07/16/2021-extrinsic impression on the stomach at the posterior wall of the gastric body, no gross lesion in the duodenum, 45 x 36 mm pancreas body mass, abutment of the SMA, celiac trunk, and compression of the splenoportal confluence.  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  I discussed the staging evaluation to date.  She appears to have unresectable disease based on vascular involvement by tumor.  We discussed treatment options including systemic therapy and radiation.  There is no clear evidence of distant metastatic  disease, though there is a suspicious left retroperitoneal lymph node.  I will present her case at the GI tumor conference on 08/01/2021.  We will make a surgical referral and request Port-A-Cath placement.  I recommend initial treatment with chemotherapy.  She appears to be a candidate for FOLFIRINOX.  She understands the goal of chemotherapy is palliative with the hope of improving her symptoms and extending survival.  We can consider adding a course of definitive radiation depending on the response to systemic therapy.  She understands the chance of undergoing a curative resection or other curative therapy is small.  She will continue tramadol as needed for pain.  She will begin a bowel regimen.  Pepcid will be added to the antiacid regimen.  Tara Jensen will return for an office visit and further discussion on 08/01/2021.  The plan is to initiate systemic therapy during the week of 08/06/2021.  Peripheral blood will be sent for Guardant360 testing.  She will be referred to the genetics counselor.  A chemotherapy plan was entered today.  Betsy Coder, MD  07/24/2021, 2:07 PM

## 2021-07-24 NOTE — Progress Notes (Signed)
PATIENT NAVIGATOR PROGRESS NOTE  Name: Tara Jensen Date: 07/24/2021 MRN: [id number]  DOB: 26-Aug-1965   Reason for visit:  Initial visit with Dr Benay Spice  Comments:  Met with [name] and her [name], Dr Collene Mares during initial visit with Dr Benay Spice.   Referral made to Dr Barry Dienes for surgery consult and [facility] placement Referral made to dietician for nutritional support Education material given for FOLFIRINOX Will obtain blood work next week for [name] and Guardant  Will return next Wednesday for Pretreatment discussion Referral made for Financial Advocate Referral for [employer] information given to call with any questions or concerns Reviewed miralax twice a day in addition to colace and then when achieves good bowel movement then once a day to maintain soft bowel movements Tramadol 16m every 6 hours for pain management Present at GI tumor board on 6/21    Time spent counseling/coordinating care: > 60 minutes

## 2021-07-24 NOTE — Progress Notes (Signed)
Referrals placed nutrition, surgery, genetics and social work

## 2021-07-24 NOTE — Progress Notes (Signed)
START ON PATHWAY REGIMEN - Pancreatic Adenocarcinoma     A cycle is every 14 days:     Oxaliplatin      Leucovorin      Irinotecan      Fluorouracil   **Always confirm dose/schedule in your pharmacy ordering system**  Patient Characteristics: Locally Advanced, Anatomically Unresectable, M0, First Line, PS = 0,1, BRCA1/2 and PALB2 Mutation Absent/Unknown, Chemotherapy Therapeutic Status: Locally Advanced, Anatomically Unresectable, M0 Line of Therapy: First Line ECOG Performance Status: 1 BRCA1/2 Mutation Status: Awaiting Test Results PALB2 Mutation Status: Awaiting Test Results Intent of Therapy: Non-Curative / Palliative Intent, Discussed with Patient

## 2021-07-24 NOTE — Progress Notes (Signed)
Called with a temperature this afternoon of 101.1 F, no symptoms, no congestion or headache just fatigued. Offered to have her come in give urine specimen although she has no overt symptoms.  Pt has taken tylenol and will monitor. Encouraged her to go to ER if  fever persists or symptoms arise

## 2021-07-25 ENCOUNTER — Other Ambulatory Visit: Payer: Self-pay

## 2021-07-25 ENCOUNTER — Telehealth: Payer: Self-pay

## 2021-07-25 ENCOUNTER — Inpatient Hospital Stay: Payer: BC Managed Care – PPO | Admitting: Nutrition

## 2021-07-25 ENCOUNTER — Other Ambulatory Visit: Payer: Self-pay | Admitting: *Deleted

## 2021-07-25 ENCOUNTER — Inpatient Hospital Stay: Payer: BC Managed Care – PPO

## 2021-07-25 ENCOUNTER — Encounter: Payer: Self-pay | Admitting: Nurse Practitioner

## 2021-07-25 ENCOUNTER — Inpatient Hospital Stay (HOSPITAL_BASED_OUTPATIENT_CLINIC_OR_DEPARTMENT_OTHER): Payer: BC Managed Care – PPO | Admitting: Nurse Practitioner

## 2021-07-25 VITALS — BP 101/65 | HR 60 | Temp 98.2°F | Resp 18 | Ht 70.0 in | Wt 108.2 lb

## 2021-07-25 DIAGNOSIS — C251 Malignant neoplasm of body of pancreas: Secondary | ICD-10-CM

## 2021-07-25 DIAGNOSIS — Z5111 Encounter for antineoplastic chemotherapy: Secondary | ICD-10-CM | POA: Diagnosis present

## 2021-07-25 DIAGNOSIS — K8689 Other specified diseases of pancreas: Secondary | ICD-10-CM

## 2021-07-25 DIAGNOSIS — Z79899 Other long term (current) drug therapy: Secondary | ICD-10-CM | POA: Diagnosis not present

## 2021-07-25 LAB — CBC WITH DIFFERENTIAL (CANCER CENTER ONLY)
Abs Immature Granulocytes: 0.03 10*3/uL (ref 0.00–0.07)
Basophils Absolute: 0 10*3/uL (ref 0.0–0.1)
Basophils Relative: 0 %
Eosinophils Absolute: 0.1 10*3/uL (ref 0.0–0.5)
Eosinophils Relative: 0 %
HCT: 34.1 % — ABNORMAL LOW (ref 36.0–46.0)
Hemoglobin: 10.9 g/dL — ABNORMAL LOW (ref 12.0–15.0)
Immature Granulocytes: 0 %
Lymphocytes Relative: 30 %
Lymphs Abs: 4.1 10*3/uL — ABNORMAL HIGH (ref 0.7–4.0)
MCH: 31.1 pg (ref 26.0–34.0)
MCHC: 32 g/dL (ref 30.0–36.0)
MCV: 97.4 fL (ref 80.0–100.0)
Monocytes Absolute: 1 10*3/uL (ref 0.1–1.0)
Monocytes Relative: 8 %
Neutro Abs: 8.5 10*3/uL — ABNORMAL HIGH (ref 1.7–7.7)
Neutrophils Relative %: 62 %
Platelet Count: 159 10*3/uL (ref 150–400)
RBC: 3.5 MIL/uL — ABNORMAL LOW (ref 3.87–5.11)
RDW: 13.2 % (ref 11.5–15.5)
WBC Count: 13.7 10*3/uL — ABNORMAL HIGH (ref 4.0–10.5)
nRBC: 0 % (ref 0.0–0.2)

## 2021-07-25 LAB — CMP (CANCER CENTER ONLY)
ALT: 13 U/L (ref 0–44)
AST: 15 U/L (ref 15–41)
Albumin: 4.2 g/dL (ref 3.5–5.0)
Alkaline Phosphatase: 56 U/L (ref 38–126)
Anion gap: 11 (ref 5–15)
BUN: 20 mg/dL (ref 6–20)
CO2: 27 mmol/L (ref 22–32)
Calcium: 9.7 mg/dL (ref 8.9–10.3)
Chloride: 100 mmol/L (ref 98–111)
Creatinine: 0.85 mg/dL (ref 0.44–1.00)
GFR, Estimated: >60 mL/min (ref >60)
Glucose, Bld: 112 mg/dL — ABNORMAL HIGH (ref 70–99)
Potassium: 4 mmol/L (ref 3.5–5.1)
Sodium: 138 mmol/L (ref 135–145)
Total Bilirubin: 0.7 mg/dL (ref 0.3–1.2)
Total Protein: 7.3 g/dL (ref 6.5–8.1)

## 2021-07-25 LAB — URINALYSIS, COMPLETE (UACMP) WITH MICROSCOPIC
Bilirubin Urine: NEGATIVE
Glucose, UA: NEGATIVE mg/dL
Hgb urine dipstick: NEGATIVE
Ketones, ur: 15 mg/dL — AB
Leukocytes,Ua: NEGATIVE
Nitrite: NEGATIVE
Protein, ur: 30 mg/dL — AB
Specific Gravity, Urine: 1.033 — ABNORMAL HIGH (ref 1.005–1.030)
pH: 6 (ref 5.0–8.0)

## 2021-07-25 MED ORDER — HYDROCODONE-ACETAMINOPHEN 5-325 MG PO TABS: 1.0000 | ORAL_TABLET | Freq: Four times a day (QID) | ORAL | 0 refills | Status: DC | PRN

## 2021-07-25 MED ORDER — SODIUM CHLORIDE 0.9 % IV SOLN: INTRAVENOUS | Status: AC

## 2021-07-25 MED ORDER — PROCHLORPERAZINE MALEATE 5 MG PO TABS: 5.0000 mg | ORAL_TABLET | Freq: Four times a day (QID) | ORAL | 0 refills | Status: DC | PRN

## 2021-07-25 MED ORDER — ACETAMINOPHEN 325 MG PO TABS
650.0000 mg | ORAL_TABLET | Freq: Once | ORAL | Status: AC
  Administered 2021-07-25: 650 mg via ORAL
  Filled 2021-07-25: qty 2

## 2021-07-25 MED ORDER — SODIUM CHLORIDE 0.9 % IV SOLN: INTRAVENOUS | Status: DC

## 2021-07-25 NOTE — Telephone Encounter (Signed)
A user error has taken place: encounter opened in error, closed for administrative reasons.

## 2021-07-25 NOTE — Progress Notes (Signed)
Patient coming in to clinic for evaluation of fever, fatigue, body aches, nausea, and increased pain.

## 2021-07-25 NOTE — Patient Instructions (Signed)
Rehydration, Adult Rehydration is the replacement of body fluids, salts, and minerals (electrolytes) that are lost during dehydration. Dehydration is when there is not enough water or other fluids in the body. This happens when you lose more fluids than you take in. Common causes of dehydration include: Not drinking enough fluids. This can occur when you are ill or doing activities that require a lot of energy, especially in hot weather. Conditions that cause loss of water or other fluids, such as diarrhea, vomiting, sweating, or urinating a lot. Other illnesses, such as fever or infection. Certain medicines, such as those that remove excess fluid from the body (diuretics). Symptoms of mild or moderate dehydration may include thirst, dry lips and mouth, and dizziness. Symptoms of severe dehydration may include increased heart rate, confusion, fainting, and not urinating. For severe dehydration, you may need to get fluids through an IV at the hospital. For mild or moderate dehydration, you can usually rehydrate at home by drinking certain fluids as told by your health care provider. What are the risks? Generally, rehydration is safe. However, taking in too much fluid (overhydration) can be a problem. This is rare. Overhydration can cause an electrolyte imbalance, kidney failure, or a decrease in salt (sodium) levels in the body. Supplies needed You will need an oral rehydration solution (ORS) if your health care provider tells you to use one. This is a drink to treat dehydration. It can be found in pharmacies and retail stores. How to rehydrate Fluids Follow instructions from your health care provider for rehydration. The kind of fluid and the amount you should drink depend on your condition. In general, you should choose drinks that you prefer. If told by your health care provider, drink an ORS. Make an ORS by following instructions on the package. Start by drinking small amounts, about  cup (120  mL) every 5-10 minutes. Slowly increase how much you drink until you have taken the amount recommended by your health care provider. Drink enough clear fluids to keep your urine pale yellow. If you were told to drink an ORS, finish it first, then start slowly drinking other clear fluids. Drink fluids such as: Water. This includes sparkling water and flavored water. Drinking only water can lead to having too little sodium in your body (hyponatremia). Follow the advice of your health care provider. Water from ice chips you suck on. Fruit juice with water you add to it (diluted). Sports drinks. Hot or cold herbal teas. Broth-based soups. Milk or milk products. Food Follow instructions from your health care provider about what to eat while you rehydrate. Your health care provider may recommend that you slowly begin eating regular foods in small amounts. Eat foods that contain a healthy balance of electrolytes, such as bananas, oranges, potatoes, tomatoes, and spinach. Avoid foods that are greasy or contain a lot of sugar. In some cases, you may get nutrition through a feeding tube that is passed through your nose and into your stomach (nasogastric tube, or NG tube). This may be done if you have uncontrolled vomiting or diarrhea. Beverages to avoid  Certain beverages may make dehydration worse. While you rehydrate, avoid drinking alcohol. How to tell if you are recovering from dehydration You may be recovering from dehydration if: You are urinating more often than before you started rehydrating. Your urine is pale yellow. Your energy level improves. You vomit less frequently. You have diarrhea less frequently. Your appetite improves or returns to normal. You feel less dizzy or less light-headed.   Your skin tone and color start to look more normal. Follow these instructions at home: Take over-the-counter and prescription medicines only as told by your health care provider. Do not take sodium  tablets. Doing this can lead to having too much sodium in your body (hypernatremia). Contact a health care provider if: You continue to have symptoms of mild or moderate dehydration, such as: Thirst. Dry lips. Slightly dry mouth. Dizziness. Dark urine or less urine than normal. Muscle cramps. You continue to vomit or have diarrhea. Get help right away if you: Have symptoms of dehydration that get worse. Have a fever. Have a severe headache. Have been vomiting and the following happens: Your vomiting gets worse or does not go away. Your vomit includes blood or green matter (bile). You cannot eat or drink without vomiting. Have problems with urination or bowel movements, such as: Diarrhea that gets worse or does not go away. Blood in your stool (feces). This may cause stool to look black and tarry. Not urinating, or urinating only a small amount of very dark urine, within 6-8 hours. Have trouble breathing. Have symptoms that get worse with treatment. These symptoms may represent a serious problem that is an emergency. Do not wait to see if the symptoms will go away. Get medical help right away. Call your local emergency services (911 in the [place] Do not drive yourself to the hospital. Summary Rehydration is the replacement of body fluids and minerals (electrolytes) that are lost during dehydration. Follow instructions from your health care provider for rehydration. The kind of fluid and amount you should drink depend on your condition. Slowly increase how much you drink until you have taken the amount recommended by your health care provider. Contact your health care provider if you continue to show signs of mild or moderate dehydration. This information is not intended to replace advice given to you by your health care provider. Make sure you discuss any questions you have with your health care provider. Document Revised: 03/31/2019 Document Reviewed: 02/08/2019 Elsevier Patient  Education  2023 [employer]  

## 2021-07-25 NOTE — Progress Notes (Signed)
56 year old female recently diagnosed with pancreas cancer and followed by Dr. Benay Spice. Plan is to begin FOLFIRINOX in the near future.  PMH includes situational depression, anxiety, recurrent UTI  Medications include Biotin, compazine and Zoloft.  Labs include Glucose of 112.  Height: 5'10". Weight: 108 pounds 3.2 oz UBW: 112 pounds BMI: 15.53 (underweight)  Patient presents to office today with hx of fever yesterday, abdominal pain, weakness, dizzy/lightheaded. She has poor energy and early satiety with poor oral intake. Last BM was 12 days ago. She was given a L of IVF today.  Nutrition Diagnosis:  Inadequate oral intake related to new diagnosis of pancreas cancer as evidenced by dehydration and 4 pound wt loss.  Intervention: Educated on strategies to increase fluids and encouraged small bites and sips of foods and fluids throughout the day. Encouraged patient to avoid restricting higher calorie foods as she needs increased calories and protein. Provided multiple fact sheets and recipes for smoothies/shakes. Reviewed foods and liquids to try. Bowel regimen. Provided contact information.  Monitoring, Evaluation, Goals: Patient will increase calories and protein to minimize wt loss and dehydration.  Next Visit: Wednesday, June 21.

## 2021-07-25 NOTE — Progress Notes (Signed)
  [facility] OFFICE PROGRESS NOTE   Diagnosis: Pancreas cancer  INTERVAL HISTORY:   Tara Jensen returns prior to scheduled follow-up due to recent onset of fever.  She reports temperature 101.6 yesterday afternoon.  Few brief episodes of feeling "cold".  No shaking chills.  No sore throat.  No urinary symptoms.  No cough.  Mild dyspnea on exertion.  No diarrhea.  She estimates last bowel movement about 12 days ago.  She thinks temperature was 100.6 during the night.  Energy level is poor.  She feels weak.  She is dizzy, lightheaded.  She is having trouble sleeping.  She acknowledges poor oral intake, solid and liquid.  She continues to have a burning discomfort in the abdomen.  Persistent back pain.  Ultram partially effective for the pain.  Objective:  Vital signs in last 24 hours:  Blood pressure 101/65, pulse 60, temperature 98.2 F (36.8 C), temperature source Oral, resp. rate 18, height '5\' 10"'$  (1.778 m), weight 108 lb 3.2 oz (49.1 kg), SpO2 99 %.    HEENT: Mild white coating over tongue.  No buccal thrush.  Mucous membranes appear moist. Resp: Lungs clear bilaterally. Cardio: Regular rate and rhythm. GI: Abdomen mildly distended, tender at the upper abdomen.  No hepatomegaly. Vascular: No leg edema. Skin: Decreased skin turgor.  Pale appearing.   Lab Results:  Lab Results  Component Value Date   WBC 13.7 (H) 07/25/2021   HGB 10.9 (L) 07/25/2021   HCT 34.1 (L) 07/25/2021   MCV 97.4 07/25/2021   PLT 159 07/25/2021   NEUTROABS 8.5 (H) 07/25/2021    Imaging:  No results found.  Medications: I have reviewed the patient's current medications.  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  Less than 180 degree abutment of the distal celiac,  common hepatic, and splenic arteries, less than 180 degree abutment of the SMA CT chest 07/18/2021-no evidence of metastatic disease EUS 07/16/2021-extrinsic impression on the stomach at the posterior wall of the gastric body, no gross lesion in the duodenum, 45 x 36 mm pancreas body mass, abutment of the SMA, celiac trunk, and compression of the splenoportal confluence.  No malignant appearing lymph nodes, numerous venous collaterals adjacent to the portal vein, FNA biopsy-malignant cells consistent with adenocarcinoma, T4N0 by EUS Markedly elevated CA 19-9 Pain secondary to #1 Weight loss secondary to #1 History of situational depression  Disposition: Tara Jensen was recently diagnosed with pancreas cancer.  Plan is to begin chemotherapy in the near future.  She presents today with recent fever, weakness.  No localizing source for infection by history or physical.  History of recurrent UTIs.  We are obtaining a urine specimen today.  Blood culture obtained.  She appears dehydrated.  She will receive a liter of IV fluids, additional IV fluids if needed.  She will try to push fluids orally.  She is scheduled to meet with a dietitian next week.  She is scheduled to return for follow-up 08/01/2021.  We are available to see her sooner if needed.  She understands to contact the office with persistent fever, other symptoms of infection.    Ned Card ANP/GNP-BC   07/25/2021  9:58 AM

## 2021-07-26 ENCOUNTER — Encounter: Payer: Self-pay | Admitting: Oncology

## 2021-07-26 ENCOUNTER — Inpatient Hospital Stay: Payer: BC Managed Care – PPO

## 2021-07-26 NOTE — Progress Notes (Signed)
Page Work  Initial Assessment   Tara Jensen is a 56 y.o. year old female contacted by phone. Clinical Social Work was referred by medical provider for assessment of psychosocial needs.   SDOH (Social Determinants of Health) assessments performed: Yes   SDOH Screenings   Alcohol Screen: Low Risk  (09/08/2019)   Alcohol Screen    Last Alcohol Screening Score (AUDIT): 1  Depression (PHQ2-9): Low Risk  (09/08/2019)   Depression (PHQ2-9)    PHQ-2 Score: 2  Financial Resource Strain: Low Risk  (09/08/2019)   Overall Financial Resource Strain (CARDIA)    Difficulty of Paying Living Expenses: Not hard at all  Food Insecurity: No Food Insecurity (09/08/2019)   Hunger Vital Sign    Worried About Running Out of Food in the Last Year: Never true    Ran Out of Food in the Last Year: Never true  Housing: Low Risk  (09/08/2019)   Housing    Last Housing Risk Score: 0  Physical Activity: Sufficiently Active (09/08/2019)   Exercise Vital Sign    Days of Exercise per Week: 7 days    Minutes of Exercise per Session: 60 min  Social Connections: Moderately Integrated (09/08/2019)   Social Connection and Isolation Panel [NHANES]    Frequency of Communication with Friends and Family: More than three times a week    Frequency of Social Gatherings with Friends and Family: Twice a week    Attends Religious Services: More than 4 times per year    Active Member of [employer] or Organizations: Yes    Attends Archivist Meetings: More than 4 times per year    Marital Status: Divorced  Stress: No Stress Concern Present (09/08/2019)   [place]    Feeling of Stress : Not at all  Tobacco Use: Low Risk  (07/25/2021)   Patient History    Smoking Tobacco Use: Never    Smokeless Tobacco Use: Never    Passive Exposure: Not on file  Transportation Needs: No Transportation Needs (09/08/2019)   PRAPARE - Transportation    Lack  of Transportation (Medical): No    Lack of Transportation (Non-Medical): No     Distress Screen completed: No     No data to display            Family/Social Information:  Housing Arrangement: patient lives alone  A close friend is moving in with her on Monday, 07/30/21. Family members/support persons in your life? Friends, Medical laboratory scientific officer, School, and Development worker, community concerns: no  Employment: Out on work excuse Patent is currently on [employer].  She is in the process of contacting her HR department to apply for Short Term Disability. 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 Income source: Employment Financial concerns: No Type of concern: None Food access concerns: no Religious or spiritual practice: Tobe Sos was a prayer group conducted for her recently. Services Currently in place:  [place] stated she has an abundance of friends and support who are providing her with everything she needs.  She sees a Contractor for the death of her son which occurred within the past two years,  Coping/ Adjustment to diagnosis: Patient understands treatment plan and what happens next? yes Concerns about diagnosis and/or treatment: Overwhelmed by information Patient reported stressors: Adjusting to my illness Hopes and/or priorities: "To maintain a positive attitude." Patient enjoys exercise Current coping skills/ strengths: Ability for insight , Average or above average  intelligence , Communication skills , General fund of knowledge , Motivation for treatment/growth , and Supportive family/friends     SUMMARY: Current SDOH Barriers:  None  Clinical Social Work Clinical Goal(s):  No clinical social work goals at this time  Interventions: Discussed common feeling and emotions when being diagnosed with cancer, and the importance of support during treatment Informed patient of the support team roles and support services at [facility] Provided  [place] contact information and encouraged patient to call with any questions or concerns Provided patient with information about support groups and massage.  CSW mailed business card and list of groups and services.   Follow Up Plan: Patient will contact CSW with any support or resource needs Patient verbalizes understanding of plan: Yes    [name] Vallarie Fei, LCSW

## 2021-07-27 ENCOUNTER — Encounter: Payer: Self-pay | Admitting: *Deleted

## 2021-07-27 ENCOUNTER — Inpatient Hospital Stay: Payer: BC Managed Care – PPO

## 2021-07-27 ENCOUNTER — Telehealth: Payer: Self-pay | Admitting: Medical Oncology

## 2021-07-27 VITALS — BP 92/54 | HR 68 | Temp 98.6°F | Resp 20 | Ht 70.0 in | Wt 109.5 lb

## 2021-07-27 DIAGNOSIS — C251 Malignant neoplasm of body of pancreas: Secondary | ICD-10-CM

## 2021-07-27 DIAGNOSIS — G8929 Other chronic pain: Secondary | ICD-10-CM

## 2021-07-27 LAB — URINE CULTURE: Culture: 10000 — AB

## 2021-07-27 MED ORDER — ACETAMINOPHEN 325 MG PO TABS
650.0000 mg | ORAL_TABLET | Freq: Four times a day (QID) | ORAL | Status: DC | PRN
  Administered 2021-07-27: 650 mg via ORAL
  Filled 2021-07-27: qty 2

## 2021-07-27 MED ORDER — SODIUM CHLORIDE 0.9 % IV SOLN: Freq: Once | INTRAVENOUS | Status: AC

## 2021-07-27 NOTE — Telephone Encounter (Signed)
[employer] 2021-05 - Specimen Collection Study to Evaluate Biomarkers in Subjects with Cancer    Outgoing call: 77  LVMOM with patient, introducing myself and informing her that Dr. Benay Spice referred her for the study. Informed patient that study is voluntary and provided her with a brief explanation of what the study entails and that the study provides a $50 visa card for her time and contribution.  Patient was informed that I can email her information about the study for her to review. Patient thanked and asked to return call should she be interested. Return number provided.   Maxwell Marion, RN, BSN, [facility] Clinical Research Nurse Lead 07/27/2021 2:40 PM

## 2021-07-27 NOTE — Patient Instructions (Signed)
Rehydration, Adult Rehydration is the replacement of body fluids, salts, and minerals (electrolytes) that are lost during dehydration. Dehydration is when there is not enough water or other fluids in the body. This happens when you lose more fluids than you take in. Common causes of dehydration include: Not drinking enough fluids. This can occur when you are ill or doing activities that require a lot of energy, especially in hot weather. Conditions that cause loss of water or other fluids, such as diarrhea, vomiting, sweating, or urinating a lot. Other illnesses, such as fever or infection. Certain medicines, such as those that remove excess fluid from the body (diuretics). Symptoms of mild or moderate dehydration may include thirst, dry lips and mouth, and dizziness. Symptoms of severe dehydration may include increased heart rate, confusion, fainting, and not urinating. For severe dehydration, you may need to get fluids through an IV at the hospital. For mild or moderate dehydration, you can usually rehydrate at home by drinking certain fluids as told by your health care provider. What are the risks? Generally, rehydration is safe. However, taking in too much fluid (overhydration) can be a problem. This is rare. Overhydration can cause an electrolyte imbalance, kidney failure, or a decrease in salt (sodium) levels in the body. Supplies needed You will need an oral rehydration solution (ORS) if your health care provider tells you to use one. This is a drink to treat dehydration. It can be found in pharmacies and retail stores. How to rehydrate Fluids Follow instructions from your health care provider for rehydration. The kind of fluid and the amount you should drink depend on your condition. In general, you should choose drinks that you prefer. If told by your health care provider, drink an ORS. Make an ORS by following instructions on the package. Start by drinking small amounts, about  cup (120  mL) every 5-10 minutes. Slowly increase how much you drink until you have taken the amount recommended by your health care provider. Drink enough clear fluids to keep your urine pale yellow. If you were told to drink an ORS, finish it first, then start slowly drinking other clear fluids. Drink fluids such as: Water. This includes sparkling water and flavored water. Drinking only water can lead to having too little sodium in your body (hyponatremia). Follow the advice of your health care provider. Water from ice chips you suck on. Fruit juice with water you add to it (diluted). Sports drinks. Hot or cold herbal teas. Broth-based soups. Milk or milk products. Food Follow instructions from your health care provider about what to eat while you rehydrate. Your health care provider may recommend that you slowly begin eating regular foods in small amounts. Eat foods that contain a healthy balance of electrolytes, such as bananas, oranges, potatoes, tomatoes, and spinach. Avoid foods that are greasy or contain a lot of sugar. In some cases, you may get nutrition through a feeding tube that is passed through your nose and into your stomach (nasogastric tube, or NG tube). This may be done if you have uncontrolled vomiting or diarrhea. Beverages to avoid  Certain beverages may make dehydration worse. While you rehydrate, avoid drinking alcohol. How to tell if you are recovering from dehydration You may be recovering from dehydration if: You are urinating more often than before you started rehydrating. Your urine is pale yellow. Your energy level improves. You vomit less frequently. You have diarrhea less frequently. Your appetite improves or returns to normal. You feel less dizzy or less light-headed.   Your skin tone and color start to look more normal. Follow these instructions at home: Take over-the-counter and prescription medicines only as told by your health care provider. Do not take sodium  tablets. Doing this can lead to having too much sodium in your body (hypernatremia). Contact a health care provider if: You continue to have symptoms of mild or moderate dehydration, such as: Thirst. Dry lips. Slightly dry mouth. Dizziness. Dark urine or less urine than normal. Muscle cramps. You continue to vomit or have diarrhea. Get help right away if you: Have symptoms of dehydration that get worse. Have a fever. Have a severe headache. Have been vomiting and the following happens: Your vomiting gets worse or does not go away. Your vomit includes blood or green matter (bile). You cannot eat or drink without vomiting. Have problems with urination or bowel movements, such as: Diarrhea that gets worse or does not go away. Blood in your stool (feces). This may cause stool to look black and tarry. Not urinating, or urinating only a small amount of very dark urine, within 6-8 hours. Have trouble breathing. Have symptoms that get worse with treatment. These symptoms may represent a serious problem that is an emergency. Do not wait to see if the symptoms will go away. Get medical help right away. Call your local emergency services (911 in the [place] Do not drive yourself to the hospital. Summary Rehydration is the replacement of body fluids and minerals (electrolytes) that are lost during dehydration. Follow instructions from your health care provider for rehydration. The kind of fluid and amount you should drink depend on your condition. Slowly increase how much you drink until you have taken the amount recommended by your health care provider. Contact your health care provider if you continue to show signs of mild or moderate dehydration. This information is not intended to replace advice given to you by your health care provider. Make sure you discuss any questions you have with your health care provider. Document Revised: 03/31/2019 Document Reviewed: 02/08/2019 Elsevier Patient  Education  2023 [employer]  

## 2021-07-27 NOTE — Progress Notes (Signed)
Faxed referral order, demographics and chart information to [facility] for Dr. Barry Dienes referral [phone].

## 2021-07-30 ENCOUNTER — Telehealth: Payer: Self-pay | Admitting: Genetic Counselor

## 2021-07-30 ENCOUNTER — Other Ambulatory Visit: Payer: Self-pay | Admitting: *Deleted

## 2021-07-30 ENCOUNTER — Inpatient Hospital Stay: Payer: BC Managed Care – PPO

## 2021-07-30 DIAGNOSIS — C251 Malignant neoplasm of body of pancreas: Secondary | ICD-10-CM

## 2021-07-30 LAB — CULTURE, BLOOD (SINGLE)
Culture: NO GROWTH
Special Requests: ADEQUATE

## 2021-07-30 MED ORDER — SODIUM CHLORIDE 0.9 % IV SOLN: INTRAVENOUS | Status: AC

## 2021-07-30 NOTE — Patient Instructions (Signed)
Rehydration, Adult Rehydration is the replacement of body fluids, salts, and minerals (electrolytes) that are lost during dehydration. Dehydration is when there is not enough water or other fluids in the body. This happens when you lose more fluids than you take in. Common causes of dehydration include: Not drinking enough fluids. This can occur when you are ill or doing activities that require a lot of energy, especially in hot weather. Conditions that cause loss of water or other fluids, such as diarrhea, vomiting, sweating, or urinating a lot. Other illnesses, such as fever or infection. Certain medicines, such as those that remove excess fluid from the body (diuretics). Symptoms of mild or moderate dehydration may include thirst, dry lips and mouth, and dizziness. Symptoms of severe dehydration may include increased heart rate, confusion, fainting, and not urinating. For severe dehydration, you may need to get fluids through an IV at the hospital. For mild or moderate dehydration, you can usually rehydrate at home by drinking certain fluids as told by your health care provider. What are the risks? Generally, rehydration is safe. However, taking in too much fluid (overhydration) can be a problem. This is rare. Overhydration can cause an electrolyte imbalance, kidney failure, or a decrease in salt (sodium) levels in the body. Supplies needed You will need an oral rehydration solution (ORS) if your health care provider tells you to use one. This is a drink to treat dehydration. It can be found in pharmacies and retail stores. How to rehydrate Fluids Follow instructions from your health care provider for rehydration. The kind of fluid and the amount you should drink depend on your condition. In general, you should choose drinks that you prefer. If told by your health care provider, drink an ORS. Make an ORS by following instructions on the package. Start by drinking small amounts, about  cup (120  mL) every 5-10 minutes. Slowly increase how much you drink until you have taken the amount recommended by your health care provider. Drink enough clear fluids to keep your urine pale yellow. If you were told to drink an ORS, finish it first, then start slowly drinking other clear fluids. Drink fluids such as: Water. This includes sparkling water and flavored water. Drinking only water can lead to having too little sodium in your body (hyponatremia). Follow the advice of your health care provider. Water from ice chips you suck on. Fruit juice with water you add to it (diluted). Sports drinks. Hot or cold herbal teas. Broth-based soups. Milk or milk products. Food Follow instructions from your health care provider about what to eat while you rehydrate. Your health care provider may recommend that you slowly begin eating regular foods in small amounts. Eat foods that contain a healthy balance of electrolytes, such as bananas, oranges, potatoes, tomatoes, and spinach. Avoid foods that are greasy or contain a lot of sugar. In some cases, you may get nutrition through a feeding tube that is passed through your nose and into your stomach (nasogastric tube, or NG tube). This may be done if you have uncontrolled vomiting or diarrhea. Beverages to avoid  Certain beverages may make dehydration worse. While you rehydrate, avoid drinking alcohol. How to tell if you are recovering from dehydration You may be recovering from dehydration if: You are urinating more often than before you started rehydrating. Your urine is pale yellow. Your energy level improves. You vomit less frequently. You have diarrhea less frequently. Your appetite improves or returns to normal. You feel less dizzy or less light-headed.   Your skin tone and color start to look more normal. Follow these instructions at home: Take over-the-counter and prescription medicines only as told by your health care provider. Do not take sodium  tablets. Doing this can lead to having too much sodium in your body (hypernatremia). Contact a health care provider if: You continue to have symptoms of mild or moderate dehydration, such as: Thirst. Dry lips. Slightly dry mouth. Dizziness. Dark urine or less urine than normal. Muscle cramps. You continue to vomit or have diarrhea. Get help right away if you: Have symptoms of dehydration that get worse. Have a fever. Have a severe headache. Have been vomiting and the following happens: Your vomiting gets worse or does not go away. Your vomit includes blood or green matter (bile). You cannot eat or drink without vomiting. Have problems with urination or bowel movements, such as: Diarrhea that gets worse or does not go away. Blood in your stool (feces). This may cause stool to look black and tarry. Not urinating, or urinating only a small amount of very dark urine, within 6-8 hours. Have trouble breathing. Have symptoms that get worse with treatment. These symptoms may represent a serious problem that is an emergency. Do not wait to see if the symptoms will go away. Get medical help right away. Call your local emergency services (911 in the [place] Do not drive yourself to the hospital. Summary Rehydration is the replacement of body fluids and minerals (electrolytes) that are lost during dehydration. Follow instructions from your health care provider for rehydration. The kind of fluid and amount you should drink depend on your condition. Slowly increase how much you drink until you have taken the amount recommended by your health care provider. Contact your health care provider if you continue to show signs of mild or moderate dehydration. This information is not intended to replace advice given to you by your health care provider. Make sure you discuss any questions you have with your health care provider. Document Revised: 03/31/2019 Document Reviewed: 02/08/2019 Elsevier Patient  Education  2023 [employer]  

## 2021-07-30 NOTE — Telephone Encounter (Signed)
Scheduled appt per 6/13 referral. Pt is aware of appt date and time. Pt is aware to arrive 15 mins prior to appt time and to bring and updated insurance card. Pt is aware of appt location.   

## 2021-07-31 ENCOUNTER — Other Ambulatory Visit: Payer: Self-pay

## 2021-07-31 ENCOUNTER — Encounter (HOSPITAL_COMMUNITY): Payer: Self-pay | Admitting: Surgery

## 2021-07-31 ENCOUNTER — Other Ambulatory Visit: Payer: Self-pay | Admitting: *Deleted

## 2021-07-31 ENCOUNTER — Inpatient Hospital Stay: Payer: BC Managed Care – PPO | Admitting: Oncology

## 2021-07-31 ENCOUNTER — Ambulatory Visit: Payer: Self-pay | Admitting: Surgery

## 2021-07-31 ENCOUNTER — Inpatient Hospital Stay: Payer: BC Managed Care – PPO

## 2021-07-31 VITALS — BP 108/66 | HR 69 | Temp 98.1°F | Resp 18 | Ht 70.0 in | Wt 111.0 lb

## 2021-07-31 DIAGNOSIS — C251 Malignant neoplasm of body of pancreas: Secondary | ICD-10-CM | POA: Diagnosis not present

## 2021-07-31 LAB — CBC WITH DIFFERENTIAL (CANCER CENTER ONLY)
Abs Immature Granulocytes: 0 10*3/uL (ref 0.00–0.07)
Basophils Absolute: 0 10*3/uL (ref 0.0–0.1)
Basophils Relative: 0 %
Eosinophils Absolute: 0.1 10*3/uL (ref 0.0–0.5)
Eosinophils Relative: 2 %
HCT: 30.2 % — ABNORMAL LOW (ref 36.0–46.0)
Hemoglobin: 9.5 g/dL — ABNORMAL LOW (ref 12.0–15.0)
Lymphocytes Relative: 46 %
Lymphs Abs: 1.5 10*3/uL (ref 0.7–4.0)
MCH: 30.8 pg (ref 26.0–34.0)
MCHC: 31.5 g/dL (ref 30.0–36.0)
MCV: 98.1 fL (ref 80.0–100.0)
Monocytes Absolute: 0.1 10*3/uL (ref 0.1–1.0)
Monocytes Relative: 4 %
Neutro Abs: 1.6 10*3/uL — ABNORMAL LOW (ref 1.7–7.7)
Neutrophils Relative %: 48 %
Platelet Count: 207 10*3/uL (ref 150–400)
RBC: 3.08 MIL/uL — ABNORMAL LOW (ref 3.87–5.11)
RDW: 13 % (ref 11.5–15.5)
WBC Count: 3.3 10*3/uL — ABNORMAL LOW (ref 4.0–10.5)
nRBC: 0 % (ref 0.0–0.2)

## 2021-07-31 LAB — CMP (CANCER CENTER ONLY)
ALT: 33 U/L (ref 0–44)
AST: 34 U/L (ref 15–41)
Albumin: 4.1 g/dL (ref 3.5–5.0)
Alkaline Phosphatase: 134 U/L — ABNORMAL HIGH (ref 38–126)
Anion gap: 8 (ref 5–15)
BUN: 9 mg/dL (ref 6–20)
CO2: 28 mmol/L (ref 22–32)
Calcium: 9.7 mg/dL (ref 8.9–10.3)
Chloride: 105 mmol/L (ref 98–111)
Creatinine: 0.68 mg/dL (ref 0.44–1.00)
GFR, Estimated: >60 mL/min (ref >60)
Glucose, Bld: 118 mg/dL — ABNORMAL HIGH (ref 70–99)
Potassium: 4 mmol/L (ref 3.5–5.1)
Sodium: 141 mmol/L (ref 135–145)
Total Bilirubin: 0.2 mg/dL — ABNORMAL LOW (ref 0.3–1.2)
Total Protein: 7.4 g/dL (ref 6.5–8.1)

## 2021-07-31 NOTE — H&P (Signed)
History of Present Illness: Tara Jensen is a 56 y.o. female who was referred to me for evaluation of a newly-diagnosed pancreatic cancer.  She has been having early satiety for months now, and developed low back pain in January of this year. Her only son died about 2 years ago and she initially attributed her symptoms to emotional stress. She was referred to Dr. Collene Jensen and underwent an EGD in May, which showed inflammation in the stomach. She then had a CT scan on 5/31 which showed a large mass in the head of the pancreas, highly suspicious for malignancy. She then underwent an EUS on 6/5 by Dr. Rush Jensen, which showed a uT4N0 mass in the neck of the pancreas, abutting and invading the SMA and celiac trunk. She has already seen Dr. Benay Jensen and is a candidate for FOLFIRINOX.  Today she endorses ongoing abdominal pain and back pain, for which she is taking tramadol. She does not have diarrhea.   CA19-9 on 6/1 was 5308.     Review of Systems: A complete review of systems was obtained from the patient.  I have reviewed this information and discussed as appropriate with the patient.  See HPI as well for other ROS.       Medical History: Past Medical History      Past Medical History:  Diagnosis Date   Anxiety          There is no problem list on file for this patient.     Past Surgical History  History reviewed. No pertinent surgical history.      Allergies  No Known Allergies           Current Outpatient Medications on File Prior to Visit  Medication Sig Dispense Refill   ciprofloxacin HCl (CIPRO) 500 MG tablet Take 500 mg by mouth 2 (two) times daily       diclofenac (VOLTAREN) 1 % topical gel Apply topically       HYDROcodone-acetaminophen (NORCO) 5-325 mg tablet Take by mouth       pantoprazole (PROTONIX) 40 MG DR tablet TAKE ONE TABLET BY MOUTH IN THE MORNING 20 MINUTES BEFORE BREAKFAST       sertraline (ZOLOFT) 25 MG tablet Take 1 tablet by mouth once daily       traMADoL  (ULTRAM) 50 mg tablet Take by mouth       acetaminophen (TYLENOL) 500 MG tablet Take by mouth        No current facility-administered medications on file prior to visit.      Family History       Family History  Problem Relation Age of Onset   High blood pressure (Hypertension) Mother     Hyperlipidemia (Elevated cholesterol) Mother     Obesity Father     High blood pressure (Hypertension) Father          Social History       Tobacco Use  Smoking Status Never  Smokeless Tobacco Never      Social History  Social History        Socioeconomic History   Marital status: Divorced  Tobacco Use   Smoking status: Never   Smokeless tobacco: Never  Substance and Sexual Activity   Alcohol use: Never   Drug use: Never        Objective:         Vitals:    07/30/21 1427  BP: 108/66  Pulse: 72  Temp: 36.6 C (97.9 F)  SpO2: 92%  Weight:  50.4 kg (111 lb 3.2 oz)  Height: 177.8 cm ('5\' 10"'$ )    Body mass index is 15.96 kg/m.   Physical Exam Vitals reviewed.  Constitutional:      General: She is not in acute distress.    Appearance: Normal appearance.  HENT:     Head: Normocephalic and atraumatic.  Eyes:     General: No scleral icterus.    Conjunctiva/sclera: Conjunctivae normal.  Cardiovascular:     Rate and Rhythm: Normal rate and regular rhythm.  Pulmonary:     Effort: Pulmonary effort is normal. No respiratory distress.     Breath sounds: Normal breath sounds.  Abdominal:     General: There is no distension.     Palpations: Abdomen is soft.     Tenderness: There is no abdominal tenderness.  Musculoskeletal:        General: Normal range of motion.  Skin:    General: Skin is warm and dry.     Coloration: Skin is not jaundiced.  Neurological:     General: No focal deficit present.     Mental Status: She is alert and oriented to person, place, and time.  Psychiatric:        Mood and Affect: Mood normal.        Behavior: Behavior normal.           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 I personally reviewed her imaging, referral notes and EUS report. There is a large mass in the neck of the pancreas, with occlusion of the SMV/PV confluence, with venous collateralization, although the distal SMV is patent. The mass abuts the SMA, celiac trunk and common hepatic artery. This is a locally advanced tumor and is not currently resectable based on imaging appearance, which I discussed with the patient today. I agree with upfront chemotherapy, and her response to chemo can be assessed after 6-8 cycles of chemotherapy. If she has a good response to chemotherapy, will consider referral to a tertiary center to discuss resection as she would likely need a complex vascular reconstruction. Will schedule for portacath insertion this week on 6/21. Posted for multidisciplinary tumor board this week.   Tara Birks, MD [facility] Surgery General, Hepatobiliary and Pancreatic Surgery 07/31/21 6:46 AM

## 2021-07-31 NOTE — Progress Notes (Signed)
[facility] OFFICE PROGRESS NOTE   Diagnosis: Pancreas cancer  INTERVAL HISTORY:   Tara Jensen returns as scheduled.  She feels stronger.  No further fever.  She continues to have abdomen and back pain.  She takes tramadol approximately 3 times daily. She saw Dr. Zenia Resides yesterday..  Dr. Zenia Resides confirms the tumor is currently not resectable.  She recommends chemotherapy.  Tara Jensen is scheduled Port-A-Cath placement tomorrow.  Objective:  Vital signs in last 24 hours:  Blood pressure 108/66, pulse 69, temperature 98.1 F (36.7 C), temperature source Oral, resp. rate 18, height '5\' 10"'$  (1.778 m), weight 111 lb (50.3 kg), SpO2 100 %.    Resp: Lungs clear bilaterally Cardio: Regular rate and rhythm GI: No mass, mild tenderness in the mid upper abdomen Vascular: No leg edema    Lab Results:  Lab Results  Component Value Date   WBC 3.3 (L) 07/31/2021   HGB 9.5 (L) 07/31/2021   HCT 30.2 (L) 07/31/2021   MCV 98.1 07/31/2021   PLT 207 07/31/2021   NEUTROABS PENDING 07/31/2021    CMP  Lab Results  Component Value Date   NA 141 07/31/2021   K 4.0 07/31/2021   CL 105 07/31/2021   CO2 28 07/31/2021   GLUCOSE 118 (H) 07/31/2021   BUN 9 07/31/2021   CREATININE 0.68 07/31/2021   CALCIUM 9.7 07/31/2021   PROT 7.4 07/31/2021   ALBUMIN 4.1 07/31/2021   AST 34 07/31/2021   ALT 33 07/31/2021   ALKPHOS 134 (H) 07/31/2021   BILITOT 0.2 (L) 07/31/2021   GFRNONAA >60 07/31/2021   GFRAA 76 09/08/2019    Medications: I have reviewed the patient's current medications.   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  Less than 180 degree abutment of the distal celiac, common hepatic, and splenic arteries, less than 180 degree abutment of the SMA CT chest  07/18/2021-no evidence of metastatic disease EUS 07/16/2021-extrinsic impression on the stomach at the posterior wall of the gastric body, no gross lesion in the duodenum, 45 x 36 mm pancreas body mass, abutment of the SMA, celiac trunk, and compression of the splenoportal confluence.  No malignant appearing lymph nodes, numerous venous collaterals adjacent to the portal vein, FNA biopsy-malignant cells consistent with adenocarcinoma, T4N0 by EUS Markedly elevated CA 19-9 Pain secondary to #1 Weight loss secondary to #1 History of situational depression Family history of breast cancer    Disposition: Tara Jensen has been diagnosed with locally advanced pancreas cancer.  She saw Dr. Zenia Resides.  The plan is to begin FOLFIRINOX on 08/08/2021.  She is scheduled for Port-A-Cath placement tomorrow.  Tara Jensen will be scheduled for restaging CTs after 5-6 cycles of FOLFIRINOX.  I reviewed potential toxicities associated with FOLFIRINOX regimen including the chance of nausea/vomiting, mucositis, alopecia, diarrhea, hematologic toxicity, infection, and bleeding.  We discussed the rash, sun sensitivity, hyperpigmentation, and hand/foot syndrome associated with 5-fluorouracil.  We reviewed the acute/delayed diarrhea seen with irinotecan.  We reviewed the allergic reaction and various types of neuropathy associated with oxaliplatin.  She agrees to proceed.  The white count is borderline low.  She will be scheduled to receive G-CSF on day 3.  Irinotecan will be held from cycle 1 chemotherapy.  Irinotecan will be added with cycle 2 pending tolerance of the FOLFOX.  She has evidence of  partial SMV occlusion.  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  Tara Jensen has been referred to the genetics counselor.  Peripheral blood NGS testing was submitted today.   A chemotherapy plan was entered today.    Betsy Coder, MD  07/31/2021  4:06 PM

## 2021-07-31 NOTE — H&P (View-Only) (Signed)
History of Present Illness: Tara Jensen is a 56 y.o. female who was referred to me for evaluation of a newly-diagnosed pancreatic cancer.  She has been having early satiety for months now, and developed low back pain in January of this year. Her only son died about 2 years ago and she initially attributed her symptoms to emotional stress. She was referred to Dr. Collene Jensen and underwent an EGD in May, which showed inflammation in the stomach. She then had a CT scan on 5/31 which showed a large mass in the head of the pancreas, highly suspicious for malignancy. She then underwent an EUS on 6/5 by Dr. Rush Jensen, which showed a uT4N0 mass in the neck of the pancreas, abutting and invading the SMA and celiac trunk. She has already seen Dr. Benay Jensen and is a candidate for FOLFIRINOX.  Today she endorses ongoing abdominal pain and back pain, for which she is taking tramadol. She does not have diarrhea.   CA19-9 on 6/1 was 5308.     Review of Systems: A complete review of systems was obtained from the patient.  I have reviewed this information and discussed as appropriate with the patient.  See HPI as well for other ROS.       Medical History: Past Medical History      Past Medical History:  Diagnosis Date   Anxiety          There is no problem list on file for this patient.     Past Surgical History  History reviewed. No pertinent surgical history.      Allergies  No Known Allergies           Current Outpatient Medications on File Prior to Visit  Medication Sig Dispense Refill   ciprofloxacin HCl (CIPRO) 500 MG tablet Take 500 mg by mouth 2 (two) times daily       diclofenac (VOLTAREN) 1 % topical gel Apply topically       HYDROcodone-acetaminophen (NORCO) 5-325 mg tablet Take by mouth       pantoprazole (PROTONIX) 40 MG DR tablet TAKE ONE TABLET BY MOUTH IN THE MORNING 20 MINUTES BEFORE BREAKFAST       sertraline (ZOLOFT) 25 MG tablet Take 1 tablet by mouth once daily       traMADoL  (ULTRAM) 50 mg tablet Take by mouth       acetaminophen (TYLENOL) 500 MG tablet Take by mouth        No current facility-administered medications on file prior to visit.      Family History       Family History  Problem Relation Age of Onset   High blood pressure (Hypertension) Mother     Hyperlipidemia (Elevated cholesterol) Mother     Obesity Father     High blood pressure (Hypertension) Father          Social History       Tobacco Use  Smoking Status Never  Smokeless Tobacco Never      Social History  Social History        Socioeconomic History   Marital status: Divorced  Tobacco Use   Smoking status: Never   Smokeless tobacco: Never  Substance and Sexual Activity   Alcohol use: Never   Drug use: Never        Objective:         Vitals:    07/30/21 1427  BP: 108/66  Pulse: 72  Temp: 36.6 C (97.9 F)  SpO2: 92%  Weight:  50.4 kg (111 lb 3.2 oz)  Height: 177.8 cm ('5\' 10"'$ )    Body mass index is 15.96 kg/m.   Physical Exam Vitals reviewed.  Constitutional:      General: She is not in acute distress.    Appearance: Normal appearance.  HENT:     Head: Normocephalic and atraumatic.  Eyes:     General: No scleral icterus.    Conjunctiva/sclera: Conjunctivae normal.  Cardiovascular:     Rate and Rhythm: Normal rate and regular rhythm.  Pulmonary:     Effort: Pulmonary effort is normal. No respiratory distress.     Breath sounds: Normal breath sounds.  Abdominal:     General: There is no distension.     Palpations: Abdomen is soft.     Tenderness: There is no abdominal tenderness.  Musculoskeletal:        General: Normal range of motion.  Skin:    General: Skin is warm and dry.     Coloration: Skin is not jaundiced.  Neurological:     General: No focal deficit present.     Mental Status: She is alert and oriented to person, place, and time.  Psychiatric:        Mood and Affect: Mood normal.        Behavior: Behavior normal.           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 I personally reviewed her imaging, referral notes and EUS report. There is a large mass in the neck of the pancreas, with occlusion of the SMV/PV confluence, with venous collateralization, although the distal SMV is patent. The mass abuts the SMA, celiac trunk and common hepatic artery. This is a locally advanced tumor and is not currently resectable based on imaging appearance, which I discussed with the patient today. I agree with upfront chemotherapy, and her response to chemo can be assessed after 6-8 cycles of chemotherapy. If she has a good response to chemotherapy, will consider referral to a tertiary center to discuss resection as she would likely need a complex vascular reconstruction. Will schedule for portacath insertion this week on 6/21. Posted for multidisciplinary tumor board this week.   Tara Birks, MD [facility] Surgery General, Hepatobiliary and Pancreatic Surgery 07/31/21 6:46 AM

## 2021-07-31 NOTE — Progress Notes (Signed)
Spoke with pt for pre-op call. Pt denies cardiac history, HTN or Diabetes.   Shower instructions given to pt.  

## 2021-08-01 ENCOUNTER — Ambulatory Visit (HOSPITAL_COMMUNITY): Payer: BC Managed Care – PPO | Admitting: Anesthesiology

## 2021-08-01 ENCOUNTER — Encounter (HOSPITAL_COMMUNITY): Payer: Self-pay | Admitting: Surgery

## 2021-08-01 ENCOUNTER — Other Ambulatory Visit: Payer: Self-pay

## 2021-08-01 ENCOUNTER — Ambulatory Visit (HOSPITAL_COMMUNITY): Payer: BC Managed Care – PPO

## 2021-08-01 ENCOUNTER — Inpatient Hospital Stay: Payer: BC Managed Care – PPO

## 2021-08-01 ENCOUNTER — Encounter (HOSPITAL_COMMUNITY)
Admission: RE | Disposition: A | Discharge status: Home / Self Care | Payer: Self-pay | Source: Home / Self Care | Attending: Surgery

## 2021-08-01 ENCOUNTER — Inpatient Hospital Stay: Payer: BC Managed Care – PPO | Admitting: Oncology

## 2021-08-01 ENCOUNTER — Inpatient Hospital Stay: Payer: BC Managed Care – PPO | Admitting: Nutrition

## 2021-08-01 ENCOUNTER — Ambulatory Visit (HOSPITAL_COMMUNITY)
Admission: RE | Admit: 2021-08-01 | Discharge: 2021-08-01 | Disposition: A | Discharge status: Home / Self Care | Payer: BC Managed Care – PPO | Attending: Surgery | Admitting: Surgery

## 2021-08-01 DIAGNOSIS — C257 Malignant neoplasm of other parts of pancreas: Secondary | ICD-10-CM | POA: Diagnosis not present

## 2021-08-01 HISTORY — DX: Malignant (primary) neoplasm, unspecified: C80.1

## 2021-08-01 HISTORY — DX: Family history of other specified conditions: Z84.89

## 2021-08-01 LAB — CANCER ANTIGEN 19-9: CA 19-9: 3611 U/mL — ABNORMAL HIGH (ref 0–35)

## 2021-08-01 SURGERY — INSERTION, TUNNELED CENTRAL VENOUS DEVICE, WITH PORT
Anesthesia: Monitor Anesthesia Care | Site: Chest | Laterality: Right

## 2021-08-01 MED ORDER — ACETAMINOPHEN 500 MG PO TABS
1000.0000 mg | ORAL_TABLET | Freq: Once | ORAL | Status: AC
  Administered 2021-08-01: 1000 mg via ORAL

## 2021-08-01 MED ORDER — LIDOCAINE 2% (20 MG/ML) 5 ML SYRINGE
INTRAMUSCULAR | Status: DC | PRN
  Administered 2021-08-01: 100 mg via INTRAVENOUS

## 2021-08-01 MED ORDER — ACETAMINOPHEN 160 MG/5ML PO SOLN: 325.0000 mg | ORAL | Status: DC | PRN

## 2021-08-01 MED ORDER — BUPIVACAINE-EPINEPHRINE (PF) 0.25% -1:200000 IJ SOLN
INTRAMUSCULAR | Status: AC
  Filled 2021-08-01: qty 30

## 2021-08-01 MED ORDER — MIDAZOLAM HCL 2 MG/2ML IJ SOLN
INTRAMUSCULAR | Status: DC | PRN
  Administered 2021-08-01: 2 mg via INTRAVENOUS

## 2021-08-01 MED ORDER — CHLORHEXIDINE GLUCONATE 0.12 % MT SOLN
15.0000 mL | Freq: Once | OROMUCOSAL | Status: AC
Start: 2021-08-01 — End: 2021-08-01
  Administered 2021-08-01: 15 mL via OROMUCOSAL
  Filled 2021-08-01: qty 15

## 2021-08-01 MED ORDER — ACETAMINOPHEN 325 MG PO TABS: 325.0000 mg | ORAL_TABLET | ORAL | Status: DC | PRN

## 2021-08-01 MED ORDER — PROPOFOL 10 MG/ML IV BOLUS
INTRAVENOUS | Status: DC | PRN
  Administered 2021-08-01: 140 mg via INTRAVENOUS

## 2021-08-01 MED ORDER — FENTANYL CITRATE (PF) 100 MCG/2ML IJ SOLN: 25.0000 ug | INTRAMUSCULAR | Status: DC | PRN

## 2021-08-01 MED ORDER — PHENYLEPHRINE HCL-NACL 20-0.9 MG/250ML-% IV SOLN
INTRAVENOUS | Status: DC | PRN
  Administered 2021-08-01: 20 ug/min via INTRAVENOUS

## 2021-08-01 MED ORDER — EPHEDRINE SULFATE-NACL 50-0.9 MG/10ML-% IV SOSY
PREFILLED_SYRINGE | INTRAVENOUS | Status: DC | PRN
  Administered 2021-08-01: 5 mg via INTRAVENOUS

## 2021-08-01 MED ORDER — ONDANSETRON HCL 4 MG/2ML IJ SOLN
INTRAMUSCULAR | Status: DC | PRN
  Administered 2021-08-01: 4 mg via INTRAVENOUS

## 2021-08-01 MED ORDER — CEFAZOLIN SODIUM-DEXTROSE 2-4 GM/100ML-% IV SOLN
2.0000 g | INTRAVENOUS | Status: AC
  Administered 2021-08-01: 2 g via INTRAVENOUS
  Filled 2021-08-01: qty 100

## 2021-08-01 MED ORDER — MEPERIDINE HCL 25 MG/ML IJ SOLN: 6.2500 mg | INTRAMUSCULAR | Status: DC | PRN

## 2021-08-01 MED ORDER — HEPARIN 6000 UNIT IRRIGATION SOLUTION
Status: AC
  Filled 2021-08-01: qty 500

## 2021-08-01 MED ORDER — FENTANYL CITRATE (PF) 250 MCG/5ML IJ SOLN
INTRAMUSCULAR | Status: AC
  Filled 2021-08-01: qty 5

## 2021-08-01 MED ORDER — ORAL CARE MOUTH RINSE
15.0000 mL | Freq: Once | OROMUCOSAL | Status: AC
Start: 2021-08-01 — End: 2021-08-01

## 2021-08-01 MED ORDER — ACETAMINOPHEN 500 MG PO TABS
ORAL_TABLET | ORAL | Status: AC
  Filled 2021-08-01: qty 2

## 2021-08-01 MED ORDER — DEXAMETHASONE SODIUM PHOSPHATE 10 MG/ML IJ SOLN
INTRAMUSCULAR | Status: DC | PRN
  Administered 2021-08-01: 5 mg via INTRAVENOUS

## 2021-08-01 MED ORDER — HEPARIN SOD (PORK) LOCK FLUSH 100 UNIT/ML IV SOLN
INTRAVENOUS | Status: DC | PRN
  Administered 2021-08-01: 500 [IU]

## 2021-08-01 MED ORDER — BUPIVACAINE-EPINEPHRINE 0.25% -1:200000 IJ SOLN
INTRAMUSCULAR | Status: DC | PRN
  Administered 2021-08-01: 10 mL

## 2021-08-01 MED ORDER — LACTATED RINGERS IV SOLN: INTRAVENOUS | Status: DC

## 2021-08-01 MED ORDER — PHENYLEPHRINE 80 MCG/ML (10ML) SYRINGE FOR IV PUSH (FOR BLOOD PRESSURE SUPPORT)
PREFILLED_SYRINGE | INTRAVENOUS | Status: DC | PRN
  Administered 2021-08-01: 80 ug via INTRAVENOUS

## 2021-08-01 MED ORDER — 0.9 % SODIUM CHLORIDE (POUR BTL) OPTIME
TOPICAL | Status: DC | PRN
  Administered 2021-08-01: 1000 mL

## 2021-08-01 MED ORDER — HEPARIN 6000 UNIT IRRIGATION SOLUTION
Status: DC | PRN
  Administered 2021-08-01: 1

## 2021-08-01 MED ORDER — OXYCODONE HCL 5 MG/5ML PO SOLN: 5.0000 mg | Freq: Once | ORAL | Status: DC | PRN

## 2021-08-01 MED ORDER — PROPOFOL 10 MG/ML IV BOLUS
INTRAVENOUS | Status: AC
  Filled 2021-08-01: qty 20

## 2021-08-01 MED ORDER — OXYCODONE HCL 5 MG PO TABS: 5.0000 mg | ORAL_TABLET | Freq: Once | ORAL | Status: DC | PRN

## 2021-08-01 MED ORDER — HEPARIN SOD (PORK) LOCK FLUSH 100 UNIT/ML IV SOLN
INTRAVENOUS | Status: AC
  Filled 2021-08-01: qty 5

## 2021-08-01 MED ORDER — MIDAZOLAM HCL 2 MG/2ML IJ SOLN
INTRAMUSCULAR | Status: AC
  Filled 2021-08-01: qty 2

## 2021-08-01 MED ORDER — ONDANSETRON HCL 4 MG/2ML IJ SOLN: 4.0000 mg | Freq: Once | INTRAMUSCULAR | Status: DC | PRN

## 2021-08-01 MED ORDER — FENTANYL CITRATE (PF) 250 MCG/5ML IJ SOLN
INTRAMUSCULAR | Status: DC | PRN
  Administered 2021-08-01 (×2): 25 ug via INTRAVENOUS

## 2021-08-01 SURGICAL SUPPLY — 41 items
ADH SKN CLS APL DERMABOND .7 (GAUZE/BANDAGES/DRESSINGS) ×1
APL PRP STRL LF DISP 70% ISPRP (MISCELLANEOUS) ×1
BAG COUNTER SPONGE SURGICOUNT (BAG) ×2 IMPLANT
BAG DECANTER FOR FLEXI CONT (MISCELLANEOUS) ×2 IMPLANT
BAG SPNG CNTER NS LX DISP (BAG) ×1
CHLORAPREP W/TINT 26 (MISCELLANEOUS) ×2 IMPLANT
COVER SURGICAL LIGHT HANDLE (MISCELLANEOUS) ×2 IMPLANT
COVER TRANSDUCER ULTRASND GEL (DISPOSABLE) ×1 IMPLANT
DERMABOND ADVANCED (GAUZE/BANDAGES/DRESSINGS) ×1
DERMABOND ADVANCED .7 DNX12 (GAUZE/BANDAGES/DRESSINGS) ×1 IMPLANT
DRAPE C-ARM 42X120 X-RAY (DRAPES) ×2 IMPLANT
DRAPE LAPAROSCOPIC ABDOMINAL (DRAPES) ×2 IMPLANT
ELECT CAUTERY BLADE 6.4 (BLADE) ×2 IMPLANT
ELECT REM PT RETURN 9FT ADLT (ELECTROSURGICAL) ×2
ELECTRODE REM PT RTRN 9FT ADLT (ELECTROSURGICAL) ×1 IMPLANT
GEL ULTRASOUND 20GR AQUASONIC (MISCELLANEOUS) IMPLANT
GLOVE BIOGEL PI IND STRL 6 (GLOVE) ×1 IMPLANT
GLOVE BIOGEL PI INDICATOR 6 (GLOVE) ×1
GLOVE SURG SYN 5.5 (GLOVE) ×2 IMPLANT
GLOVE SURG SYN 5.5 PF PI (GLOVE) ×1 IMPLANT
GOWN STRL REUS W/ TWL LRG LVL3 (GOWN DISPOSABLE) ×2 IMPLANT
GOWN STRL REUS W/TWL LRG LVL3 (GOWN DISPOSABLE) ×4
INTRODUCER COOK 11FR (CATHETERS) IMPLANT
KIT BASIN OR (CUSTOM PROCEDURE TRAY) ×2 IMPLANT
KIT PORT POWER 8FR ISP CVUE (Port) ×1 IMPLANT
KIT TURNOVER KIT B (KITS) ×2 IMPLANT
NS IRRIG 1000ML POUR BTL (IV SOLUTION) ×2 IMPLANT
PAD ARMBOARD 7.5X6 YLW CONV (MISCELLANEOUS) ×2 IMPLANT
PENCIL BUTTON HOLSTER BLD 10FT (ELECTRODE) ×2 IMPLANT
POSITIONER HEAD DONUT 9IN (MISCELLANEOUS) ×2 IMPLANT
SET INTRODUCER 12FR PACEMAKER (INTRODUCER) IMPLANT
SET SHEATH INTRODUCER 10FR (MISCELLANEOUS) IMPLANT
SHEATH COOK PEEL AWAY SET 9F (SHEATH) IMPLANT
SUT MNCRL AB 4-0 PS2 18 (SUTURE) ×2 IMPLANT
SUT PROLENE 2 0 SH DA (SUTURE) ×4 IMPLANT
SUT VIC AB 3-0 SH 27 (SUTURE) ×2
SUT VIC AB 3-0 SH 27X BRD (SUTURE) ×1 IMPLANT
SYR 5ML LUER SLIP (SYRINGE) ×2 IMPLANT
TOWEL GREEN STERILE (TOWEL DISPOSABLE) ×2 IMPLANT
TOWEL GREEN STERILE FF (TOWEL DISPOSABLE) ×2 IMPLANT
TRAY LAPAROSCOPIC MC (CUSTOM PROCEDURE TRAY) ×2 IMPLANT

## 2021-08-01 NOTE — Interval H&P Note (Signed)
History and Physical Interval Note:  08/01/2021 1:05 PM  Tara Jensen  has presented today for surgery, with the diagnosis of PANCREATIC CANCER.  The various methods of treatment have been discussed with the patient and family. After consideration of risks, benefits and other options for treatment, the patient has consented to  Procedure(s): INSERTION PORT-A-CATH (N/A) as a surgical intervention.  The patient's history has been reviewed, patient examined, no change in status, stable for surgery.  I have reviewed the patient's chart and labs.  Questions were answered to the patient's satisfaction.     Dwan Bolt

## 2021-08-01 NOTE — Discharge Instructions (Signed)
SURGERY DISCHARGE INSTRUCTIONS: PORT-A-CATH PLACEMENT  Activity You may resume your usual activities as tolerated Ok to shower in 24 hours, but do not bathe or submerge incisions underwater. Do not drive while taking narcotic pain medication.  Wound Care Your incision is covered with skin glue called Dermabond. This will peel off on its own over time. You may shower and allow warm soapy water to run over your incisions. Gently pat dry. Do not submerge your incision underwater. Monitor your incision for any new redness, tenderness, or drainage. You may start using your port in 48 hours. Do not apply EMLA cream directly over the Dermabond (skin glue).  When to Call Us: Fever greater than 100.5 New redness, drainage, or swelling at incision site Severe pain, nausea, or vomiting Shortness of breath, difficulty breathing  For questions or concerns, please call the [facility] office at [phone].  

## 2021-08-01 NOTE — Anesthesia Procedure Notes (Signed)
Procedure Name: LMA Insertion Date/Time: 08/01/2021 1:47 PM  Performed by: Thelma Comp, CRNAPre-anesthesia Checklist: Patient identified, Emergency Drugs available, Suction available and Patient being monitored Patient Re-evaluated:Patient Re-evaluated prior to induction Oxygen Delivery Method: Circle System Utilized Preoxygenation: Pre-oxygenation with 100% oxygen Induction Type: IV induction Ventilation: Mask ventilation without difficulty LMA: LMA inserted LMA Size: 4.0 Number of attempts: 1 Placement Confirmation: positive ETCO2 Tube secured with: Tape Dental Injury: Teeth and Oropharynx as per pre-operative assessment

## 2021-08-01 NOTE — Transfer of Care (Signed)
Immediate Anesthesia Transfer of Care Note  Patient: Tara Jensen  Procedure(s) Performed: INSERTION PORT-A-CATH (Right: Chest)  Patient Location: PACU  Anesthesia Type:General  Level of Consciousness: awake and drowsy  Airway & Oxygen Therapy: Patient Spontanous Breathing  Post-op Assessment: Report given to RN and Post -op Vital signs reviewed and stable  Post vital signs: Reviewed and stable  Last Vitals:  Vitals Value Taken Time  BP 108/58 08/01/21 1446  Temp    Pulse 95 08/01/21 1447  Resp 12 08/01/21 1447  SpO2 100 % 08/01/21 1447  Vitals shown include unvalidated device data.  Last Pain:  Vitals:   08/01/21 1048  PainSc: 2       Patients Stated Pain Goal: 0 (04/12/47 9692)  Complications: No notable events documented.

## 2021-08-01 NOTE — Op Note (Signed)
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 She is to begin FOLFIRINOX next week and presented today for port insertion.  Findings: 8-Fr single-lumen power port placed via the right subclavian vein. Total catheter length of 13.5 cm.  Procedure details: Informed consent was obtained in the preoperative area prior to the procedure. The patient was brought to the operating room and placed on the table in the supine position. General anesthesia was induced and appropriate lines and drains were placed for intraoperative monitoring. Perioperative antibiotics were administered per SCIP guidelines. The chest and neck were prepped and draped in the usual sterile fashion. A pre-procedure timeout was taken verifying patient identity, surgical site and procedure to be performed.  The patient was placed in Trendelenberg and the right subclavian vein was accessed with a large-bore needle. A guidewire was inserted and advanced, and position in the SVC was confirmed fluoroscopically. The needle was removed and the wire was clipped to the drapes to secure its position. Next a small skin incision was made incorporating the wire exit site and a subcutaneous pocket was created with cautery. The port and catheter were then flushed and brought onto the field. Three 2-0 prolene sutures were used to secure the port in the subcutaneous pocket, but the sutures were not tied down. The port was placed in the pocket and the attached cathether was then measured using fluoro - it was placed over the skin adjacent to the guidewire, and marked externally at the cavoatrial junction. The catheter was  then cut at this location, which was at 13.5cm. The dilator and sheath were then advanced over the guidewire under fluoroscopic guidance, and the wire and dilator were removed. The end of the catheter was inserted through the sheath and advanced, and the sheath was peeled away. There was resistance to advancement of the catheter through the sheath it the hub of the sheath. Thus the catheter was detached from the port, a guidewire was advanced through the catheter into the vein, and the sheath was completely peeled away. The catheter was then advanced into position over the wire under fluoroscopic guidance, the wire was removed, and the catheter  was reattached to the port. The port was then accessed with a Huber needle, and blood was aspirated and the port was flushed with heparinized saline. A final fluoroscopic image confirmed appropriate position of the catheter tip within the SVC, without kinking of the catheter. The prolene sutures were tied down. A final flush of 500 units heparin (100 units/mL) was given via the port. The skin was closed with a deep dermal layer of interrupted 3-0 Vicryl suture, followed by a running subcuticular 4-0 monocryl suture. Dermabond was applied.  The patient tolerated the procedure well with no apparent complications. All counts were correct x2 at the end of the procedure. The patient was extubated and taken to PACU in stable condition.  Michaelle Birks, MD 08/01/21 2:42 PM

## 2021-08-01 NOTE — Anesthesia Preprocedure Evaluation (Addendum)
Anesthesia Evaluation  Patient identified by MRN, date of birth, ID band Patient awake    Reviewed: Allergy & Precautions, NPO status , Patient's Chart, lab work & pertinent test results  Airway Mallampati: I  TM Distance: >3 FB     Dental  (+) Teeth Intact, Dental Advisory Given   Pulmonary neg pulmonary ROS,    breath sounds clear to auscultation       Cardiovascular negative cardio ROS   Rhythm:Regular Rate:Normal     Neuro/Psych negative neurological ROS     GI/Hepatic negative GI ROS, Neg liver ROS,   Endo/Other  History noted Dr. Nyoka Cowden  Renal/GU negative Renal ROS     Musculoskeletal   Abdominal   Peds  Hematology  (+) Blood dyscrasia, anemia ,   Anesthesia Other Findings   Reproductive/Obstetrics                            Anesthesia Physical  Anesthesia Plan  ASA: 3  Anesthesia Plan: MAC   Post-op Pain Management: Minimal or no pain anticipated   Induction: Intravenous  PONV Risk Score and Plan: 2 and Treatment may vary due to age or medical condition and Propofol infusion  Airway Management Planned: Simple Face Mask, Natural Airway and Nasal Cannula  Additional Equipment: None  Intra-op Plan:   Post-operative Plan:   Informed Consent: I have reviewed the patients History and Physical, chart, labs and discussed the procedure including the risks, benefits and alternatives for the proposed anesthesia with the patient or authorized representative who has indicated his/her understanding and acceptance.     Dental advisory given  Plan Discussed with: CRNA and Anesthesiologist  Anesthesia Plan Comments:        Anesthesia Quick Evaluation

## 2021-08-01 NOTE — Anesthesia Postprocedure Evaluation (Signed)
Anesthesia Post Note  Patient: Tara Jensen  Procedure(s) Performed: INSERTION PORT-A-CATH (Right: Chest)     Patient location during evaluation: PACU Anesthesia Type: General Level of consciousness: awake and alert Pain management: pain level controlled Vital Signs Assessment: post-procedure vital signs reviewed and stable Respiratory status: spontaneous breathing, nonlabored ventilation, respiratory function stable and patient connected to nasal cannula oxygen Cardiovascular status: blood pressure returned to baseline and stable Postop Assessment: no apparent nausea or vomiting Anesthetic complications: no   No notable events documented.  Last Vitals:  Vitals:   08/01/21 1028 08/01/21 1445  BP: 112/73 (!) 108/58  Pulse: 60 91  Resp: 17 12  Temp: 36.6 C 36.6 C  SpO2: 100% 100%    Last Pain:  Vitals:   08/01/21 1445  PainSc: 0-No pain                 Louana Fontenot

## 2021-08-01 NOTE — Progress Notes (Signed)
The proposed treatment discussed in conference is for discussion purpose only and is not a binding recommendation.  The patients have not been physically examined, or presented with their treatment options.  Therefore, final treatment plans cannot be decided.  

## 2021-08-02 ENCOUNTER — Encounter (HOSPITAL_COMMUNITY): Payer: Self-pay | Admitting: Surgery

## 2021-08-02 LAB — GENETIC SCREENING ORDER

## 2021-08-04 ENCOUNTER — Other Ambulatory Visit: Payer: Self-pay | Admitting: Oncology

## 2021-08-05 ENCOUNTER — Other Ambulatory Visit: Payer: Self-pay | Admitting: Oncology

## 2021-08-06 ENCOUNTER — Ambulatory Visit: Payer: BC Managed Care – PPO | Admitting: Nurse Practitioner

## 2021-08-06 ENCOUNTER — Other Ambulatory Visit (HOSPITAL_BASED_OUTPATIENT_CLINIC_OR_DEPARTMENT_OTHER): Payer: Self-pay

## 2021-08-06 ENCOUNTER — Inpatient Hospital Stay: Payer: BC Managed Care – PPO

## 2021-08-06 ENCOUNTER — Other Ambulatory Visit: Payer: Self-pay | Admitting: *Deleted

## 2021-08-06 ENCOUNTER — Inpatient Hospital Stay: Payer: BC Managed Care – PPO | Admitting: Nurse Practitioner

## 2021-08-06 ENCOUNTER — Encounter: Payer: Self-pay | Admitting: Nurse Practitioner

## 2021-08-06 ENCOUNTER — Encounter: Payer: Self-pay | Admitting: Oncology

## 2021-08-06 ENCOUNTER — Other Ambulatory Visit: Payer: BC Managed Care – PPO

## 2021-08-06 VITALS — BP 97/59 | HR 71 | Temp 98.1°F | Resp 18 | Ht 70.0 in | Wt 106.2 lb

## 2021-08-06 DIAGNOSIS — C251 Malignant neoplasm of body of pancreas: Secondary | ICD-10-CM

## 2021-08-06 DIAGNOSIS — Z95828 Presence of other vascular implants and grafts: Secondary | ICD-10-CM

## 2021-08-06 LAB — CBC WITH DIFFERENTIAL (CANCER CENTER ONLY)
Abs Immature Granulocytes: 0.03 10*3/uL (ref 0.00–0.07)
Basophils Absolute: 0 10*3/uL (ref 0.0–0.1)
Basophils Relative: 0 %
Eosinophils Absolute: 0.2 10*3/uL (ref 0.0–0.5)
Eosinophils Relative: 3 %
HCT: 32.7 % — ABNORMAL LOW (ref 36.0–46.0)
Hemoglobin: 10.6 g/dL — ABNORMAL LOW (ref 12.0–15.0)
Immature Granulocytes: 1 %
Lymphocytes Relative: 26 %
Lymphs Abs: 1.3 10*3/uL (ref 0.7–4.0)
MCH: 31.5 pg (ref 26.0–34.0)
MCHC: 32.4 g/dL (ref 30.0–36.0)
MCV: 97 fL (ref 80.0–100.0)
Monocytes Absolute: 0.3 10*3/uL (ref 0.1–1.0)
Monocytes Relative: 6 %
Neutro Abs: 3.2 10*3/uL (ref 1.7–7.7)
Neutrophils Relative %: 64 %
Platelet Count: 233 10*3/uL (ref 150–400)
RBC: 3.37 MIL/uL — ABNORMAL LOW (ref 3.87–5.11)
RDW: 13.2 % (ref 11.5–15.5)
WBC Count: 5 10*3/uL (ref 4.0–10.5)
nRBC: 0 % (ref 0.0–0.2)

## 2021-08-06 MED ORDER — SODIUM CHLORIDE 0.9% FLUSH
10.0000 mL | Freq: Once | INTRAVENOUS | Status: AC
  Administered 2021-08-06: 10 mL via INTRAVENOUS

## 2021-08-06 MED ORDER — ONDANSETRON HCL 8 MG PO TABS: 8.0000 mg | ORAL_TABLET | Freq: Three times a day (TID) | ORAL | 0 refills | Status: DC | PRN

## 2021-08-06 MED ORDER — TRAMADOL HCL 50 MG PO TABS
50.0000 mg | ORAL_TABLET | Freq: Three times a day (TID) | ORAL | 0 refills | Status: DC | PRN
  Filled 2021-08-06: qty 75, 25d supply, fill #0

## 2021-08-06 MED ORDER — HEPARIN SOD (PORK) LOCK FLUSH 100 UNIT/ML IV SOLN
500.0000 [IU] | Freq: Once | INTRAVENOUS | Status: AC
  Administered 2021-08-06: 500 [IU] via INTRAVENOUS

## 2021-08-06 MED ORDER — LIDOCAINE-PRILOCAINE 2.5-2.5 % EX CREA: 1.0000 | TOPICAL_CREAM | CUTANEOUS | 0 refills | Status: DC | PRN

## 2021-08-06 NOTE — Progress Notes (Unsigned)
Orders entered

## 2021-08-07 ENCOUNTER — Inpatient Hospital Stay: Payer: BC Managed Care – PPO

## 2021-08-07 LAB — GUARDANT 360

## 2021-08-08 ENCOUNTER — Inpatient Hospital Stay: Payer: BC Managed Care – PPO | Admitting: Nutrition

## 2021-08-08 ENCOUNTER — Ambulatory Visit: Payer: BC Managed Care – PPO | Admitting: Nurse Practitioner

## 2021-08-08 ENCOUNTER — Inpatient Hospital Stay: Payer: BC Managed Care – PPO

## 2021-08-08 ENCOUNTER — Other Ambulatory Visit: Payer: Self-pay | Admitting: *Deleted

## 2021-08-08 ENCOUNTER — Other Ambulatory Visit: Payer: BC Managed Care – PPO

## 2021-08-08 VITALS — BP 108/67 | HR 62 | Temp 98.4°F | Resp 18

## 2021-08-08 DIAGNOSIS — C251 Malignant neoplasm of body of pancreas: Secondary | ICD-10-CM

## 2021-08-08 MED ORDER — OXALIPLATIN CHEMO INJECTION 100 MG/20ML
85.0000 mg/m2 | Freq: Once | INTRAVENOUS | Status: AC
  Administered 2021-08-08: 130 mg via INTRAVENOUS
  Filled 2021-08-08: qty 10

## 2021-08-08 MED ORDER — SODIUM CHLORIDE 0.9 % IV SOLN
10.0000 mg | Freq: Once | INTRAVENOUS | Status: AC
  Administered 2021-08-08: 10 mg via INTRAVENOUS
  Filled 2021-08-08: qty 10

## 2021-08-08 MED ORDER — LEUCOVORIN CALCIUM INJECTION 350 MG
400.0000 mg/m2 | Freq: Once | INTRAVENOUS | Status: AC
  Administered 2021-08-08: 620 mg via INTRAVENOUS
  Filled 2021-08-08: qty 31

## 2021-08-08 MED ORDER — PALONOSETRON HCL INJECTION 0.25 MG/5ML
0.2500 mg | Freq: Once | INTRAVENOUS | Status: AC
  Administered 2021-08-08: 0.25 mg via INTRAVENOUS
  Filled 2021-08-08: qty 5

## 2021-08-08 MED ORDER — SODIUM CHLORIDE 0.9 % IV SOLN
150.0000 mg | Freq: Once | INTRAVENOUS | Status: AC
  Administered 2021-08-08: 150 mg via INTRAVENOUS
  Filled 2021-08-08: qty 150

## 2021-08-08 MED ORDER — SODIUM CHLORIDE 0.9 % IV SOLN
2400.0000 mg/m2 | INTRAVENOUS | Status: DC
  Administered 2021-08-08: 3700 mg via INTRAVENOUS
  Filled 2021-08-08: qty 74

## 2021-08-08 MED ORDER — DEXTROSE 5 % IV SOLN: Freq: Once | INTRAVENOUS | Status: AC

## 2021-08-08 MED ORDER — SODIUM CHLORIDE 0.9 % IV SOLN
400.0000 mg/m2 | Freq: Once | INTRAVENOUS | Status: DC
  Filled 2021-08-08: qty 31

## 2021-08-08 NOTE — Progress Notes (Signed)
Pharmacist Chemotherapy Monitoring - Initial Assessment    Anticipated start date: 08/08/21   The following has been reviewed per standard work regarding the patient's treatment regimen: The patient's diagnosis, treatment plan and drug doses, and organ/hematologic function Lab orders and baseline tests specific to treatment regimen  The treatment plan start date, drug sequencing, and pre-medications Prior authorization status  Patient's documented medication list, including drug-drug interaction screen and prescriptions for anti-emetics and supportive care specific to the treatment regimen The drug concentrations, fluid compatibility, administration routes, and timing of the medications to be used The patient's access for treatment and lifetime cumulative dose history, if applicable  The patient's medication allergies and previous infusion related reactions, if applicable   Changes made to treatment plan:  N/A  Follow up needed:  N/A   Patrica Duel, RPH, 08/08/2021  7:13 AM

## 2021-08-08 NOTE — Patient Instructions (Addendum)
[place]  The chemotherapy medication bag should finish at 46 hours, 96 hours, or 7 days. For example, if your pump is scheduled for 46 hours and it was put on at 4:00 p.m., it should finish at 2:00 p.m. the day it is scheduled to come off regardless of your appointment time.     Estimated time to finish at 10:45 Friday, August 10, 2021.   If the display on your pump reads "Low Volume" and it is beeping, take the batteries out of the pump and come to the cancer center for it to be taken off.   If the pump alarms go off prior to the pump reading "Low Volume" then call [phone] and someone can assist you.  If the plunger comes out and the chemotherapy medication is leaking out, please use your home chemo spill kit to clean up the spill. Do NOT use paper towels or other household products.  If you have problems or questions regarding your pump, please call either [phone] (24 hours a day) or the cancer center Monday-Friday 8:00 a.m.- 4:30 p.m. at the clinic number and we will assist you. If you are unable to get assistance, then go to the nearest Emergency Department and ask the staff to contact the IV team for assistance.   Discharge Instructions: Thank you for choosing [facility] to provide your oncology and hematology care.   If you have a lab appointment with the [facility], please go directly to the [facility] and check in at the registration area.   Wear comfortable clothing and clothing appropriate for easy access to any Portacath or PICC line.   We strive to give you quality time with your provider. You may need to reschedule your appointment if you arrive late (15 or more minutes).  Arriving late affects you and other patients whose appointments are after yours.  Also, if you miss three or more appointments without notifying the office, you may be dismissed from the clinic at the provider's discretion.      For prescription  refill requests, have your pharmacy contact our office and allow 72 hours for refills to be completed.    Today you received the following chemotherapy and/or immunotherapy agents Oxaliplatin, Leucovorin, Fluorouracil      To help prevent nausea and vomiting after your treatment, we encourage you to take your nausea medication as directed.  BELOW ARE SYMPTOMS THAT SHOULD BE REPORTED IMMEDIATELY: *FEVER GREATER THAN 100.4 F (38 C) OR HIGHER *CHILLS OR SWEATING *NAUSEA AND VOMITING THAT IS NOT CONTROLLED WITH YOUR NAUSEA MEDICATION *UNUSUAL SHORTNESS OF BREATH *UNUSUAL BRUISING OR BLEEDING *URINARY PROBLEMS (pain or burning when urinating, or frequent urination) *BOWEL PROBLEMS (unusual diarrhea, constipation, pain near the anus) TENDERNESS IN MOUTH AND THROAT WITH OR WITHOUT PRESENCE OF ULCERS (sore throat, sores in mouth, or a toothache) UNUSUAL RASH, SWELLING OR PAIN  UNUSUAL VAGINAL DISCHARGE OR ITCHING   Items with * indicate a potential emergency and should be followed up as soon as possible or go to the Emergency Department if any problems should occur.  Please show the CHEMOTHERAPY ALERT CARD or IMMUNOTHERAPY ALERT CARD at check-in to the Emergency Department and triage nurse.  Should you have questions after your visit or need to cancel or reschedule your appointment, please contact [facility]  Dept: [phone]  and follow the prompts.  Office hours are 8:00 a.m. to 4:30 p.m. Monday - Friday. Please note that voicemails  left after 4:00 p.m. may not be returned until the following business day.  We are closed weekends and major holidays. You have access to a nurse at all times for urgent questions. Please call the main number to the clinic Dept: [phone] and follow the prompts.   For any non-urgent questions, you may also contact your provider using MyChart. We now offer e-Visits for anyone 66 and older to request care online for non-urgent symptoms.  For details visit [email or web address].   Also download the MyChart app! Go to the app store, search "MyChart", open the app, select [facility], and log in with your MyChart username and password.  Masks are optional in the cancer centers. If you would like for your care team to wear a mask while they are taking care of you, please let them know. For doctor visits, patients may have with them one support person who is at least 54 years old. At this time, visitors are not allowed in the infusion area.  Oxaliplatin Injection What is this medication? OXALIPLATIN (ox AL i PLA tin) is a chemotherapy drug. It targets fast dividing cells, like cancer cells, and causes these cells to die. This medicine is used to treat cancers of the colon and rectum, and many other cancers. This medicine may be used for other purposes; ask your health care provider or pharmacist if you have questions. COMMON BRAND NAME(S): Eloxatin What should I tell my care team before I take this medication? They need to know if you have any of these conditions: heart disease history of irregular heartbeat liver disease low blood counts, like white cells, platelets, or red blood cells lung or breathing disease, like asthma take medicines that treat or prevent blood clots tingling of the fingers or toes, or other nerve disorder an unusual or allergic reaction to oxaliplatin, other chemotherapy, other medicines, foods, dyes, or preservatives pregnant or trying to get pregnant breast-feeding How should I use this medication? This drug is given as an infusion into a vein. It is administered in a hospital or clinic by a specially trained health care professional. Talk to your pediatrician regarding the use of this medicine in children. Special care may be needed. Overdosage: If you think you have taken too much of this medicine contact a poison control center or emergency room at once. NOTE: This medicine is only for you. Do not  share this medicine with others. What if I miss a dose? It is important not to miss a dose. Call your doctor or health care professional if you are unable to keep an appointment. What may interact with this medication? Do not take this medicine with any of the following medications: cisapride dronedarone pimozide thioridazine This medicine may also interact with the following medications: aspirin and aspirin-like medicines certain medicines that treat or prevent blood clots like warfarin, apixaban, dabigatran, and rivaroxaban cisplatin cyclosporine diuretics medicines for infection like acyclovir, adefovir, amphotericin B, bacitracin, cidofovir, foscarnet, ganciclovir, gentamicin, pentamidine, vancomycin NSAIDs, medicines for pain and inflammation, like ibuprofen or naproxen other medicines that prolong the QT interval (an abnormal heart rhythm) pamidronate zoledronic acid This list may not describe all possible interactions. Give your health care provider a list of all the medicines, herbs, non-prescription drugs, or dietary supplements you use. Also tell them if you smoke, drink alcohol, or use illegal drugs. Some items may interact with your medicine. What should I watch for while using this medication? Your condition will be monitored carefully while you are receiving this medicine.  You may need blood work done while you are taking this medicine. This medicine may make you feel generally unwell. This is not uncommon as chemotherapy can affect healthy cells as well as cancer cells. Report any side effects. Continue your course of treatment even though you feel ill unless your healthcare professional tells you to stop. This medicine can make you more sensitive to cold. Do not drink cold drinks or use ice. Cover exposed skin before coming in contact with cold temperatures or cold objects. When out in cold weather wear warm clothing and cover your mouth and nose to warm the air that goes  into your lungs. Tell your doctor if you get sensitive to the cold. Do not become pregnant while taking this medicine or for 9 months after stopping it. Women should inform their health care professional if they wish to become pregnant or think they might be pregnant. Men should not father a child while taking this medicine and for 6 months after stopping it. There is potential for serious side effects to an unborn child. Talk to your health care professional for more information. Do not breast-feed a child while taking this medicine or for 3 months after stopping it. This medicine has caused ovarian failure in some women. This medicine may make it more difficult to get pregnant. Talk to your health care professional if you are concerned about your fertility. This medicine has caused decreased sperm counts in some men. This may make it more difficult to father a child. Talk to your health care professional if you are concerned about your fertility. This medicine may increase your risk of getting an infection. Call your health care professional for advice if you get a fever, chills, or sore throat, or other symptoms of a cold or flu. Do not treat yourself. Try to avoid being around people who are sick. Avoid taking medicines that contain aspirin, acetaminophen, ibuprofen, naproxen, or ketoprofen unless instructed by your health care professional. These medicines may hide a fever. Be careful brushing or flossing your teeth or using a toothpick because you may get an infection or bleed more easily. If you have any dental work done, tell your dentist you are receiving this medicine. What side effects may I notice from receiving this medication? Side effects that you should report to your doctor or health care professional as soon as possible: allergic reactions like skin rash, itching or hives, swelling of the face, lips, or tongue breathing problems cough low blood counts - this medicine may decrease the  number of white blood cells, red blood cells, and platelets. You may be at increased risk for infections and bleeding nausea, vomiting pain, redness, or irritation at site where injected pain, tingling, numbness in the hands or feet signs and symptoms of bleeding such as bloody or black, tarry stools; red or dark brown urine; spitting up blood or brown material that looks like coffee grounds; red spots on the skin; unusual bruising or bleeding from the eyes, gums, or nose signs and symptoms of a dangerous change in heartbeat or heart rhythm like chest pain; dizziness; fast, irregular heartbeat; palpitations; feeling faint or lightheaded; falls signs and symptoms of infection like fever; chills; cough; sore throat; pain or trouble passing urine signs and symptoms of liver injury like dark yellow or brown urine; general ill feeling or flu-like symptoms; light-colored stools; loss of appetite; nausea; right upper belly pain; unusually weak or tired; yellowing of the eyes or skin signs and symptoms of low  red blood cells or anemia such as unusually weak or tired; feeling faint or lightheaded; falls signs and symptoms of muscle injury like dark urine; trouble passing urine or change in the amount of urine; unusually weak or tired; muscle pain; back pain Side effects that usually do not require medical attention (report to your doctor or health care professional if they continue or are bothersome): changes in taste diarrhea gas hair loss loss of appetite mouth sores This list may not describe all possible side effects. Call your doctor for medical advice about side effects. You may report side effects to FDA at 1-800-FDA-1088. Where should I keep my medication? This drug is given in a hospital or clinic and will not be stored at home. NOTE: This sheet is a summary. It may not cover all possible information. If you have questions about this medicine, talk to your doctor, pharmacist, or health care  provider.  2023 Elsevier/Gold Standard (2020-12-29 00:00:00)  Leucovorin injection What is this medication? LEUCOVORIN (loo koe VOR in) is used to prevent or treat the harmful effects of some medicines. This medicine is used to treat anemia caused by a low amount of folic acid in the body. It is also used with 5-fluorouracil (5-FU) to treat colon cancer. This medicine may be used for other purposes; ask your health care provider or pharmacist if you have questions. What should I tell my care team before I take this medication? They need to know if you have any of these conditions: anemia from low levels of vitamin B-12 in the blood an unusual or allergic reaction to leucovorin, folic acid, other medicines, foods, dyes, or preservatives pregnant or trying to get pregnant breast-feeding How should I use this medication? This medicine is for injection into a muscle or into a vein. It is given by a health care professional in a hospital or clinic setting. Talk to your pediatrician regarding the use of this medicine in children. Special care may be needed. Overdosage: If you think you have taken too much of this medicine contact a poison control center or emergency room at once. NOTE: This medicine is only for you. Do not share this medicine with others. What if I miss a dose? This does not apply. What may interact with this medication? capecitabine fluorouracil phenobarbital phenytoin primidone trimethoprim-sulfamethoxazole This list may not describe all possible interactions. Give your health care provider a list of all the medicines, herbs, non-prescription drugs, or dietary supplements you use. Also tell them if you smoke, drink alcohol, or use illegal drugs. Some items may interact with your medicine. What should I watch for while using this medication? Your condition will be monitored carefully while you are receiving this medicine. This medicine may increase the side effects of  5-fluorouracil, 5-FU. Tell your doctor or health care professional if you have diarrhea or mouth sores that do not get better or that get worse. What side effects may I notice from receiving this medication? Side effects that you should report to your doctor or health care professional as soon as possible: allergic reactions like skin rash, itching or hives, swelling of the face, lips, or tongue breathing problems fever, infection mouth sores unusual bleeding or bruising unusually weak or tired Side effects that usually do not require medical attention (report to your doctor or health care professional if they continue or are bothersome): constipation or diarrhea loss of appetite nausea, vomiting This list may not describe all possible side effects. Call your doctor for medical advice  about side effects. You may report side effects to FDA at 1-800-FDA-1088. Where should I keep my medication? This drug is given in a hospital or clinic and will not be stored at home. NOTE: This sheet is a summary. It may not cover all possible information. If you have questions about this medicine, talk to your doctor, pharmacist, or health care provider.  2023 Elsevier/Gold Standard (2007-08-06 00:00:00)  Fluorouracil, 5-FU injection What is this medication? FLUOROURACIL, 5-FU (flure oh YOOR a sil) is a chemotherapy drug. It slows the growth of cancer cells. This medicine is used to treat many types of cancer like breast cancer, colon or rectal cancer, pancreatic cancer, and stomach cancer. This medicine may be used for other purposes; ask your health care provider or pharmacist if you have questions. COMMON BRAND NAME(S): Adrucil What should I tell my care team before I take this medication? They need to know if you have any of these conditions: blood disorders dihydropyrimidine dehydrogenase (DPD) deficiency infection (especially a virus infection such as chickenpox, cold sores, or herpes) kidney  disease liver disease malnourished, poor nutrition recent or ongoing radiation therapy an unusual or allergic reaction to fluorouracil, other chemotherapy, other medicines, foods, dyes, or preservatives pregnant or trying to get pregnant breast-feeding How should I use this medication? This drug is given as an infusion or injection into a vein. It is administered in a hospital or clinic by a specially trained health care professional. Talk to your pediatrician regarding the use of this medicine in children. Special care may be needed. Overdosage: If you think you have taken too much of this medicine contact a poison control center or emergency room at once. NOTE: This medicine is only for you. Do not share this medicine with others. What if I miss a dose? It is important not to miss your dose. Call your doctor or health care professional if you are unable to keep an appointment. What may interact with this medication? Do not take this medicine with any of the following medications: live virus vaccines This medicine may also interact with the following medications: medicines that treat or prevent blood clots like warfarin, enoxaparin, and dalteparin This list may not describe all possible interactions. Give your health care provider a list of all the medicines, herbs, non-prescription drugs, or dietary supplements you use. Also tell them if you smoke, drink alcohol, or use illegal drugs. Some items may interact with your medicine. What should I watch for while using this medication? Visit your doctor for checks on your progress. This drug may make you feel generally unwell. This is not uncommon, as chemotherapy can affect healthy cells as well as cancer cells. Report any side effects. Continue your course of treatment even though you feel ill unless your doctor tells you to stop. In some cases, you may be given additional medicines to help with side effects. Follow all directions for their  use. Call your doctor or health care professional for advice if you get a fever, chills or sore throat, or other symptoms of a cold or flu. Do not treat yourself. This drug decreases your body's ability to fight infections. Try to avoid being around people who are sick. This medicine may increase your risk to bruise or bleed. Call your doctor or health care professional if you notice any unusual bleeding. Be careful brushing and flossing your teeth or using a toothpick because you may get an infection or bleed more easily. If you have any dental work done, tell  your dentist you are receiving this medicine. Avoid taking products that contain aspirin, acetaminophen, ibuprofen, naproxen, or ketoprofen unless instructed by your doctor. These medicines may hide a fever. Do not become pregnant while taking this medicine. Women should inform their doctor if they wish to become pregnant or think they might be pregnant. There is a potential for serious side effects to an unborn child. Talk to your health care professional or pharmacist for more information. Do not breast-feed an infant while taking this medicine. Men should inform their doctor if they wish to father a child. This medicine may lower sperm counts. Do not treat diarrhea with over the counter products. Contact your doctor if you have diarrhea that lasts more than 2 days or if it is severe and watery. This medicine can make you more sensitive to the sun. Keep out of the sun. If you cannot avoid being in the sun, wear protective clothing and use sunscreen. Do not use sun lamps or tanning beds/booths. What side effects may I notice from receiving this medication? Side effects that you should report to your doctor or health care professional as soon as possible: allergic reactions like skin rash, itching or hives, swelling of the face, lips, or tongue low blood counts - this medicine may decrease the number of white blood cells, red blood cells and  platelets. You may be at increased risk for infections and bleeding. signs of infection - fever or chills, cough, sore throat, pain or difficulty passing urine signs of decreased platelets or bleeding - bruising, pinpoint red spots on the skin, black, tarry stools, blood in the urine signs of decreased red blood cells - unusually weak or tired, fainting spells, lightheadedness breathing problems changes in vision chest pain mouth sores nausea and vomiting pain, swelling, redness at site where injected pain, tingling, numbness in the hands or feet redness, swelling, or sores on hands or feet stomach pain unusual bleeding Side effects that usually do not require medical attention (report to your doctor or health care professional if they continue or are bothersome): changes in finger or toe nails diarrhea dry or itchy skin hair loss headache loss of appetite sensitivity of eyes to the light stomach upset unusually teary eyes This list may not describe all possible side effects. Call your doctor for medical advice about side effects. You may report side effects to FDA at 1-800-FDA-1088. Where should I keep my medication? This drug is given in a hospital or clinic and will not be stored at home. NOTE: This sheet is a summary. It may not cover all possible information. If you have questions about this medicine, talk to your doctor, pharmacist, or health care provider.  2023 Elsevier/Gold Standard (2020-12-29 00:00:00)

## 2021-08-08 NOTE — Progress Notes (Signed)
Nutrition follow up completed with patient during infusion. She is receiving her first cycle of FOLFIRINOX today for Pancreas cancer.  Weight decreased to 106 pounds 3.2 oz from 108 pounds 3.2 oz June 14. She reports she is eating better. Her appetite is better. States she feels best in the morning so generally eats a Jam bar or oatmeal at breakfast. She mixes miralax with body armour and drinks with breakfast.  Lunch may be egg salad and wheat thins or a milkshake. Dinner has been 1/2 crab cake or a small amount of pasta salad. She has many friends who have brought her food. Bowels are moving. She has increased her hydration. No other nutrition impact symptoms.  Nutrition Diagnosis: Inadequate oral intake ongoing but improved.  Intervention: Educated on importance of small frequent snacks throughout the day. Encouraged liquids with calories for hydration. Continue milkshakes as desired. Provided supportive listening.  Monitoring, Evaluation, Goals: Increase calorie and protein to minimize wt loss.  Next Visit: Wednesday, July 12 during infusion.

## 2021-08-08 NOTE — Progress Notes (Signed)
Added IVF orders for 6/30

## 2021-08-09 ENCOUNTER — Encounter: Payer: Self-pay | Admitting: *Deleted

## 2021-08-09 NOTE — Progress Notes (Signed)
PATIENT NAVIGATOR PROGRESS NOTE  Name: Tara Jensen Date: 08/09/2021 MRN: [id number]  DOB: [birth year]   Reason for visit First chemo F/U  Comments:  Spoke with pt and her caregiver Opal Sidles this am. Tolerated treatment well, slight jaw pain with first few bites of food and then improves = cold sensitivity to Oxaliplatin Slight nausea this am that responded to Compazine and eating  We discussed the importance of hydration, eating strategies and walking   Will continue Miralax to promote bowel movements  Encouraged to call with any issues or questions    Time spent counseling/coordinating care: 45-60 minutes

## 2021-08-10 ENCOUNTER — Inpatient Hospital Stay: Payer: BC Managed Care – PPO

## 2021-08-10 VITALS — BP 111/71 | HR 60 | Temp 98.0°F | Resp 18

## 2021-08-10 DIAGNOSIS — C251 Malignant neoplasm of body of pancreas: Secondary | ICD-10-CM

## 2021-08-10 DIAGNOSIS — R110 Nausea: Secondary | ICD-10-CM

## 2021-08-10 MED ORDER — DEXAMETHASONE 4 MG PO TABS: 4.0000 mg | ORAL_TABLET | Freq: Two times a day (BID) | ORAL | 0 refills | Status: DC

## 2021-08-10 MED ORDER — SODIUM CHLORIDE 0.9% FLUSH
10.0000 mL | INTRAVENOUS | Status: DC | PRN
  Administered 2021-08-10: 10 mL

## 2021-08-10 MED ORDER — SODIUM CHLORIDE 0.9 % IV SOLN: INTRAVENOUS | Status: AC

## 2021-08-10 MED ORDER — HEPARIN SOD (PORK) LOCK FLUSH 100 UNIT/ML IV SOLN
500.0000 [IU] | Freq: Once | INTRAVENOUS | Status: AC | PRN
  Administered 2021-08-10: 500 [IU]

## 2021-08-10 MED ORDER — SODIUM CHLORIDE 0.9 % IV SOLN
10.0000 mg | Freq: Once | INTRAVENOUS | Status: AC
  Administered 2021-08-10: 10 mg via INTRAVENOUS
  Filled 2021-08-10: qty 10

## 2021-08-10 NOTE — Progress Notes (Signed)
Patient here for pump stop and rehydration fluids.  Patient has complaints of nausea without vomiting.  Patient states she has been able to eat small snacks and drink fluids. Patient states she has been taking her home PO nausea meds, but still has nausea. Ned Card, NP made aware and saw patient in infusion room.  Verbal orders received for IV decadron.

## 2021-08-10 NOTE — Progress Notes (Signed)
Per Dr. Benay Spice: Hold gcsf this cycle since she only received FOLFOX. Unlikely that counts will drop significantly with this regimen. Will add back with cycle 2.

## 2021-08-10 NOTE — Patient Instructions (Signed)
Rehydration, Adult Rehydration is the replacement of body fluids, salts, and minerals (electrolytes) that are lost during dehydration. Dehydration is when there is not enough water or other fluids in the body. This happens when you lose more fluids than you take in. Common causes of dehydration include: Not drinking enough fluids. This can occur when you are ill or doing activities that require a lot of energy, especially in hot weather. Conditions that cause loss of water or other fluids, such as diarrhea, vomiting, sweating, or urinating a lot. Other illnesses, such as fever or infection. Certain medicines, such as those that remove excess fluid from the body (diuretics). Symptoms of mild or moderate dehydration may include thirst, dry lips and mouth, and dizziness. Symptoms of severe dehydration may include increased heart rate, confusion, fainting, and not urinating. For severe dehydration, you may need to get fluids through an IV at the hospital. For mild or moderate dehydration, you can usually rehydrate at home by drinking certain fluids as told by your health care provider. What are the risks? Generally, rehydration is safe. However, taking in too much fluid (overhydration) can be a problem. This is rare. Overhydration can cause an electrolyte imbalance, kidney failure, or a decrease in salt (sodium) levels in the body. Supplies needed You will need an oral rehydration solution (ORS) if your health care provider tells you to use one. This is a drink to treat dehydration. It can be found in pharmacies and retail stores. How to rehydrate Fluids Follow instructions from your health care provider for rehydration. The kind of fluid and the amount you should drink depend on your condition. In general, you should choose drinks that you prefer. If told by your health care provider, drink an ORS. Make an ORS by following instructions on the package. Start by drinking small amounts, about  cup (120  mL) every 5-10 minutes. Slowly increase how much you drink until you have taken the amount recommended by your health care provider. Drink enough clear fluids to keep your urine pale yellow. If you were told to drink an ORS, finish it first, then start slowly drinking other clear fluids. Drink fluids such as: Water. This includes sparkling water and flavored water. Drinking only water can lead to having too little sodium in your body (hyponatremia). Follow the advice of your health care provider. Water from ice chips you suck on. Fruit juice with water you add to it (diluted). Sports drinks. Hot or cold herbal teas. Broth-based soups. Milk or milk products. Food Follow instructions from your health care provider about what to eat while you rehydrate. Your health care provider may recommend that you slowly begin eating regular foods in small amounts. Eat foods that contain a healthy balance of electrolytes, such as bananas, oranges, potatoes, tomatoes, and spinach. Avoid foods that are greasy or contain a lot of sugar. In some cases, you may get nutrition through a feeding tube that is passed through your nose and into your stomach (nasogastric tube, or NG tube). This may be done if you have uncontrolled vomiting or diarrhea. Beverages to avoid  Certain beverages may make dehydration worse. While you rehydrate, avoid drinking alcohol. How to tell if you are recovering from dehydration You may be recovering from dehydration if: You are urinating more often than before you started rehydrating. Your urine is pale yellow. Your energy level improves. You vomit less frequently. You have diarrhea less frequently. Your appetite improves or returns to normal. You feel less dizzy or less light-headed.   Your skin tone and color start to look more normal. Follow these instructions at home: Take over-the-counter and prescription medicines only as told by your health care provider. Do not take sodium  tablets. Doing this can lead to having too much sodium in your body (hypernatremia). Contact a health care provider if: You continue to have symptoms of mild or moderate dehydration, such as: Thirst. Dry lips. Slightly dry mouth. Dizziness. Dark urine or less urine than normal. Muscle cramps. You continue to vomit or have diarrhea. Get help right away if you: Have symptoms of dehydration that get worse. Have a fever. Have a severe headache. Have been vomiting and the following happens: Your vomiting gets worse or does not go away. Your vomit includes blood or green matter (bile). You cannot eat or drink without vomiting. Have problems with urination or bowel movements, such as: Diarrhea that gets worse or does not go away. Blood in your stool (feces). This may cause stool to look black and tarry. Not urinating, or urinating only a small amount of very dark urine, within 6-8 hours. Have trouble breathing. Have symptoms that get worse with treatment. These symptoms may represent a serious problem that is an emergency. Do not wait to see if the symptoms will go away. Get medical help right away. Call your local emergency services (911 in the [place] Do not drive yourself to the hospital. Summary Rehydration is the replacement of body fluids and minerals (electrolytes) that are lost during dehydration. Follow instructions from your health care provider for rehydration. The kind of fluid and amount you should drink depend on your condition. Slowly increase how much you drink until you have taken the amount recommended by your health care provider. Contact your health care provider if you continue to show signs of mild or moderate dehydration. This information is not intended to replace advice given to you by your health care provider. Make sure you discuss any questions you have with your health care provider. Document Revised: 03/31/2019 Document Reviewed: 02/08/2019 Elsevier Patient  Education  2023 [employer]  

## 2021-08-15 ENCOUNTER — Other Ambulatory Visit: Payer: Self-pay | Admitting: *Deleted

## 2021-08-15 ENCOUNTER — Other Ambulatory Visit: Payer: Self-pay | Admitting: Nurse Practitioner

## 2021-08-15 DIAGNOSIS — C251 Malignant neoplasm of body of pancreas: Secondary | ICD-10-CM

## 2021-08-15 MED ORDER — MAGIC MOUTHWASH: 5.0000 mL | Freq: Four times a day (QID) | ORAL | 1 refills | Status: DC

## 2021-08-15 NOTE — Progress Notes (Signed)
Magic Mouthwash called into [facility] with directions swish and spit 4x day

## 2021-08-17 ENCOUNTER — Other Ambulatory Visit: Payer: Self-pay | Admitting: Nurse Practitioner

## 2021-08-17 DIAGNOSIS — C251 Malignant neoplasm of body of pancreas: Secondary | ICD-10-CM

## 2021-08-17 MED ORDER — TRAMADOL HCL 50 MG PO TABS: 50.0000 mg | ORAL_TABLET | Freq: Four times a day (QID) | ORAL | 0 refills | Status: DC | PRN

## 2021-08-18 ENCOUNTER — Other Ambulatory Visit: Payer: Self-pay | Admitting: Oncology

## 2021-08-22 ENCOUNTER — Other Ambulatory Visit: Payer: Self-pay | Admitting: *Deleted

## 2021-08-22 ENCOUNTER — Inpatient Hospital Stay: Payer: BC Managed Care – PPO | Admitting: Nutrition

## 2021-08-22 ENCOUNTER — Inpatient Hospital Stay: Payer: BC Managed Care – PPO

## 2021-08-22 ENCOUNTER — Inpatient Hospital Stay: Payer: BC Managed Care – PPO | Admitting: Nurse Practitioner

## 2021-08-22 ENCOUNTER — Encounter: Payer: Self-pay | Admitting: Nurse Practitioner

## 2021-08-22 ENCOUNTER — Encounter: Payer: Self-pay | Admitting: *Deleted

## 2021-08-22 ENCOUNTER — Inpatient Hospital Stay: Payer: BC Managed Care – PPO | Attending: Oncology

## 2021-08-22 VITALS — BP 114/66 | HR 70 | Temp 98.2°F | Resp 18

## 2021-08-22 VITALS — BP 108/69 | HR 82 | Temp 98.2°F | Resp 18 | Ht 70.0 in | Wt 107.0 lb

## 2021-08-22 DIAGNOSIS — C251 Malignant neoplasm of body of pancreas: Secondary | ICD-10-CM

## 2021-08-22 DIAGNOSIS — Z5111 Encounter for antineoplastic chemotherapy: Secondary | ICD-10-CM | POA: Insufficient documentation

## 2021-08-22 DIAGNOSIS — C787 Secondary malignant neoplasm of liver and intrahepatic bile duct: Secondary | ICD-10-CM | POA: Diagnosis not present

## 2021-08-22 DIAGNOSIS — Z79899 Other long term (current) drug therapy: Secondary | ICD-10-CM | POA: Diagnosis not present

## 2021-08-22 LAB — CBC WITH DIFFERENTIAL (CANCER CENTER ONLY)
Abs Immature Granulocytes: 0.01 10*3/uL (ref 0.00–0.07)
Basophils Absolute: 0 10*3/uL (ref 0.0–0.1)
Basophils Relative: 0 %
Eosinophils Absolute: 0.1 10*3/uL (ref 0.0–0.5)
Eosinophils Relative: 3 %
HCT: 32.6 % — ABNORMAL LOW (ref 36.0–46.0)
Hemoglobin: 10.5 g/dL — ABNORMAL LOW (ref 12.0–15.0)
Immature Granulocytes: 0 %
Lymphocytes Relative: 29 %
Lymphs Abs: 1.1 10*3/uL (ref 0.7–4.0)
MCH: 31.3 pg (ref 26.0–34.0)
MCHC: 32.2 g/dL (ref 30.0–36.0)
MCV: 97 fL (ref 80.0–100.0)
Monocytes Absolute: 0.4 10*3/uL (ref 0.1–1.0)
Monocytes Relative: 11 %
Neutro Abs: 2.1 10*3/uL (ref 1.7–7.7)
Neutrophils Relative %: 57 %
Platelet Count: 121 10*3/uL — ABNORMAL LOW (ref 150–400)
RBC: 3.36 MIL/uL — ABNORMAL LOW (ref 3.87–5.11)
RDW: 14.1 % (ref 11.5–15.5)
WBC Count: 3.8 10*3/uL — ABNORMAL LOW (ref 4.0–10.5)
nRBC: 0 % (ref 0.0–0.2)

## 2021-08-22 LAB — CMP (CANCER CENTER ONLY)
ALT: 51 U/L — ABNORMAL HIGH (ref 0–44)
AST: 43 U/L — ABNORMAL HIGH (ref 15–41)
Albumin: 4.4 g/dL (ref 3.5–5.0)
Alkaline Phosphatase: 91 U/L (ref 38–126)
Anion gap: 10 (ref 5–15)
BUN: 32 mg/dL — ABNORMAL HIGH (ref 6–20)
CO2: 26 mmol/L (ref 22–32)
Calcium: 9.7 mg/dL (ref 8.9–10.3)
Chloride: 105 mmol/L (ref 98–111)
Creatinine: 0.64 mg/dL (ref 0.44–1.00)
GFR, Estimated: >60 mL/min (ref >60)
Glucose, Bld: 105 mg/dL — ABNORMAL HIGH (ref 70–99)
Potassium: 4.4 mmol/L (ref 3.5–5.1)
Sodium: 141 mmol/L (ref 135–145)
Total Bilirubin: 0.3 mg/dL (ref 0.3–1.2)
Total Protein: 7.1 g/dL (ref 6.5–8.1)

## 2021-08-22 MED ORDER — SODIUM CHLORIDE 0.9 % IV SOLN
150.0000 mg | Freq: Once | INTRAVENOUS | Status: AC
  Administered 2021-08-22: 150 mg via INTRAVENOUS
  Filled 2021-08-22: qty 5

## 2021-08-22 MED ORDER — DEXTROSE 5 % IV SOLN: Freq: Once | INTRAVENOUS | Status: AC

## 2021-08-22 MED ORDER — SODIUM CHLORIDE 0.9 % IV SOLN
2400.0000 mg/m2 | INTRAVENOUS | Status: DC
  Administered 2021-08-22: 3700 mg via INTRAVENOUS
  Filled 2021-08-22: qty 74

## 2021-08-22 MED ORDER — SODIUM CHLORIDE 0.9 % IV SOLN
150.0000 mg/m2 | Freq: Once | INTRAVENOUS | Status: AC
  Administered 2021-08-22: 240 mg via INTRAVENOUS
  Filled 2021-08-22: qty 2

## 2021-08-22 MED ORDER — OXALIPLATIN CHEMO INJECTION 100 MG/20ML
85.0000 mg/m2 | Freq: Once | INTRAVENOUS | Status: AC
  Administered 2021-08-22: 130 mg via INTRAVENOUS
  Filled 2021-08-22: qty 10

## 2021-08-22 MED ORDER — PALONOSETRON HCL INJECTION 0.25 MG/5ML
0.2500 mg | Freq: Once | INTRAVENOUS | Status: AC
  Administered 2021-08-22: 0.25 mg via INTRAVENOUS
  Filled 2021-08-22: qty 5

## 2021-08-22 MED ORDER — SODIUM CHLORIDE 0.9 % IV SOLN
400.0000 mg/m2 | Freq: Once | INTRAVENOUS | Status: AC
  Administered 2021-08-22: 620 mg via INTRAVENOUS
  Filled 2021-08-22: qty 31

## 2021-08-22 MED ORDER — ATROPINE SULFATE 1 MG/ML IV SOLN
0.5000 mg | Freq: Once | INTRAVENOUS | Status: AC | PRN
  Administered 2021-08-22: 0.5 mg via INTRAVENOUS
  Filled 2021-08-22: qty 1

## 2021-08-22 MED ORDER — SODIUM CHLORIDE 0.9 % IV SOLN
10.0000 mg | Freq: Once | INTRAVENOUS | Status: AC
  Administered 2021-08-22: 10 mg via INTRAVENOUS
  Filled 2021-08-22: qty 1

## 2021-08-22 MED ORDER — SODIUM CHLORIDE 0.9% FLUSH
10.0000 mL | INTRAVENOUS | Status: DC | PRN
  Administered 2021-08-22: 10 mL

## 2021-08-22 NOTE — Progress Notes (Signed)
PATIENT NAVIGATOR PROGRESS NOTE  Name: Tara Jensen Date: 08/22/2021 MRN: [id number]  DOB: 1965/06/20   Reason for visit:  F/U appt and 2nd cycle of chemo  Comments:  Met with pt during visit with Ned Card NP, discussed 2nd opinion at [facility].  Appt scheduled for 7/21 at 2 pm with Dr Hyman Hopes and Dr Dennison Nancy  Patient given appt information and that [facility] will be calling to discuss directions      Time spent counseling/coordinating care: 45-60 minutes

## 2021-08-22 NOTE — Progress Notes (Signed)
[facility] OFF[facility] PROGRESS NOTE   Diagnosis: Pancreas cancer  INTERVAL HISTORY:   Tara Jensen returns as scheduled.  She completed cycle 1 FOLFOX 08/08/2021.  She developed nausea day 2.  She received IV fluids and IV dexamethasone on day 3 and completed 2 additional days of oral dexamethasone, days 4 and 5.  She reports the nausea was effectively relieved with these measures.  She thinks she may have had 1 minor mouth sore.  No diarrhea.  No significant cold sensitivity.  She had jaw pain for a few days.  Abdominal/back pain well controlled with a combination of tramadol and Tylenol.  Objective:  Vital signs in last 24 hours:  Blood pressure 108/69, pulse 82, temperature 98.2 F (36.8 C), temperature source Oral, resp. rate 18, height '5\' 10"'$  (1.778 m), weight 107 lb (48.5 kg), last menstrual period 11/01/2018, SpO2 100 %.    HEENT: No thrush or ulcers. Resp: Lungs clear bilaterally. Cardio: Regular rate and rhythm. GI: Abdomen soft and nontender.  No hepatosplenomegaly. Vascular: No leg edema. Skin: Palms without erythema. Port-A-Cath without erythema.   Lab Results:  Lab Results  Component Value Date   WBC 3.8 (L) 08/22/2021   HGB 10.5 (L) 08/22/2021   HCT 32.6 (L) 08/22/2021   MCV 97.0 08/22/2021   PLT 121 (L) 08/22/2021   NEUTROABS 2.1 08/22/2021    Imaging:  No results found.  Medications: I have reviewed the patient's current medications.  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  Less than 180 degree abutment of the distal celiac, common hepatic, and splenic arteries, less than 180 degree abutment of the SMA CT chest 07/18/2021-no evidence of metastatic disease EUS 07/16/2021-extrinsic impression on the stomach at the posterior  wall of the gastric body, no gross lesion in the duodenum, 45 x 36 mm pancreas body mass, abutment of the SMA, celiac trunk, and compression of the splenoportal confluence.  No malignant appearing lymph nodes, numerous venous collaterals adjacent to the portal vein, FNA biopsy-malignant cells consistent with adenocarcinoma, T4N0 by EUS Markedly elevated CA 19-9 Cycle 1 FOLFOX 08/08/2021 Cycle 2 FOLFIRINOX 08/22/2021 Pain secondary to #1-controlled with tramadol and Tylenol. Weight loss secondary to #1 History of situational depression Family history of breast cancer    Disposition: Tara Jensen appears stable.  She has completed 1 cycle of FOLFOX.  She had delayed nausea, effectively relieved with dexamethasone.  Otherwise she tolerated the treatment well.  Plan to proceed with cycle 2 today as scheduled with the addition of irinotecan.  We reviewed potential side effects associated with irinotecan including bone marrow toxicity, diarrhea, hair loss.  She agrees to proceed.  She has Imodium on hand at home if needed.  She will begin prophylactic dexamethasone day 2 for the nausea.  She will receive white cell growth factor support on the day of pump discontinuation.  We discussed potential toxicities including bone pain, rash, splenic rupture.  She agrees to proceed.  CBC and chemistry panel reviewed.  Labs adequate to proceed as above.  AST and ALT mildly elevated.  This may be due to Oxaliplatin.  We will continue to monitor.  She will return for lab, follow-up, cycle 3 FOLFIRINOX in 2 weeks.  We are available to see her sooner if needed.    Ned Card ANP/GNP-BC   08/22/2021  9:59 AM

## 2021-08-22 NOTE — Progress Notes (Signed)
1340: Patient with complaint of nausea that is new.  Irinotecan stopped.  Informed patient that this can be caused by the irinotecan.  Line flushed with normal saline and atropine given.  Infusion restarted. Patient able to complete infusion without any further issues.

## 2021-08-22 NOTE — Patient Instructions (Signed)

## 2021-08-22 NOTE — Progress Notes (Signed)
Brief nutrition follow up completed with patient. She reports she is doing better. Her appetite is increased and she is craving lots of vegetables. She tries to eat a lot of protein and has brought snacks with her today. Weight stable at 107 pounds. Provided support and active listening. Encouraged to continue increased healthy foods with increased protein and avoid weight loss.

## 2021-08-22 NOTE — Patient Instructions (Addendum)
[facility]  The chemotherapy medication bag should finish at 46 hours, 96 hours, or 7 days. For example, if your pump is scheduled for 46 hours and it was put on at 4:00 p.m., it should finish at 2:00 p.m. the day it is scheduled to come off regardless of your appointment time.     Estimated time to finish at 1pm Friday, August 24, 2021.   If the display on your pump reads "Low Volume" and it is beeping, take the batteries out of the pump and come to the cancer center for it to be taken off.   If the pump alarms go off prior to the pump reading "Low Volume" then call [phone] and someone can assist you.  If the plunger comes out and the chemotherapy medication is leaking out, please use your home chemo spill kit to clean up the spill. Do NOT use paper towels or other household products.  If you have problems or questions regarding your pump, please call either [phone] (24 hours a day) or the cancer center Monday-Friday 8:00 a.m.- 4:30 p.m. at the clinic number and we will assist you. If you are unable to get assistance, then go to the nearest Emergency Department and ask the staff to contact the IV team for assistance.   Discharge Instructions: Thank you for choosing [facility] to provide your oncology and hematology care.   If you have a lab appointment with the [facility], please go directly to the [facility] and check in at the registration area.   Wear comfortable clothing and clothing appropriate for easy access to any Portacath or PICC line.   We strive to give you quality time with your provider. You may need to reschedule your appointment if you arrive late (15 or more minutes).  Arriving late affects you and other patients whose appointments are after yours.  Also, if you miss three or more appointments without notifying the office, you may be dismissed from the clinic at the provider's discretion.      For prescription refill  requests, have your pharmacy contact our office and allow 72 hours for refills to be completed.    Today you received the following chemotherapy and/or immunotherapy agents Oxaliplatin,Irinotecan, Leucovorin, Fluorouracil      To help prevent nausea and vomiting after your treatment, we encourage you to take your nausea medication as directed.  BELOW ARE SYMPTOMS THAT SHOULD BE REPORTED IMMEDIATELY: *FEVER GREATER THAN 100.4 F (38 C) OR HIGHER *CHILLS OR SWEATING *NAUSEA AND VOMITING THAT IS NOT CONTROLLED WITH YOUR NAUSEA MEDICATION *UNUSUAL SHORTNESS OF BREATH *UNUSUAL BRUISING OR BLEEDING *URINARY PROBLEMS (pain or burning when urinating, or frequent urination) *BOWEL PROBLEMS (unusual diarrhea, constipation, pain near the anus) TENDERNESS IN MOUTH AND THROAT WITH OR WITHOUT PRESENCE OF ULCERS (sore throat, sores in mouth, or a toothache) UNUSUAL RASH, SWELLING OR PAIN  UNUSUAL VAGINAL DISCHARGE OR ITCHING   Items with * indicate a potential emergency and should be followed up as soon as possible or go to the Emergency Department if any problems should occur.  Please show the CHEMOTHERAPY ALERT CARD or IMMUNOTHERAPY ALERT CARD at check-in to the Emergency Department and triage nurse.  Should you have questions after your visit or need to cancel or reschedule your appointment, please contact [facility]  Dept: [phone]  and follow the prompts.  Office hours are 8:00 a.m. to 4:30 p.m. Monday - Friday. Please note that voicemails  left after 4:00 p.m. may not be returned until the following business day.  We are closed weekends and major holidays. You have access to a nurse at all times for urgent questions. Please call the main number to the clinic Dept: [phone] and follow the prompts.   For any non-urgent questions, you may also contact your provider using MyChart. We now offer e-Visits for anyone 67 and older to request care online for non-urgent  symptoms. For details visit [email or web address].   Also download the MyChart app! Go to the app store, search "MyChart", open the app, select [facility], and log in with your MyChart username and password.  Masks are optional in the cancer centers. If you would like for your care team to wear a mask while they are taking care of you, please let them know. For doctor visits, patients may have with them one support person who is at least 54 years old. At this time, visitors are not allowed in the infusion area.  Oxaliplatin Injection What is this medication? OXALIPLATIN (ox AL i PLA tin) is a chemotherapy drug. It targets fast dividing cells, like cancer cells, and causes these cells to die. This medicine is used to treat cancers of the colon and rectum, and many other cancers. This medicine may be used for other purposes; ask your health care provider or pharmacist if you have questions. COMMON BRAND NAME(S): Eloxatin What should I tell my care team before I take this medication? They need to know if you have any of these conditions: heart disease history of irregular heartbeat liver disease low blood counts, like white cells, platelets, or red blood cells lung or breathing disease, like asthma take medicines that treat or prevent blood clots tingling of the fingers or toes, or other nerve disorder an unusual or allergic reaction to oxaliplatin, other chemotherapy, other medicines, foods, dyes, or preservatives pregnant or trying to get pregnant breast-feeding How should I use this medication? This drug is given as an infusion into a vein. It is administered in a hospital or clinic by a specially trained health care professional. Talk to your pediatrician regarding the use of this medicine in children. Special care may be needed. Overdosage: If you think you have taken too much of this medicine contact a poison control center or emergency room at once. NOTE: This medicine is only for  you. Do not share this medicine with others. What if I miss a dose? It is important not to miss a dose. Call your doctor or health care professional if you are unable to keep an appointment. What may interact with this medication? Do not take this medicine with any of the following medications: cisapride dronedarone pimozide thioridazine This medicine may also interact with the following medications: aspirin and aspirin-like medicines certain medicines that treat or prevent blood clots like warfarin, apixaban, dabigatran, and rivaroxaban cisplatin cyclosporine diuretics medicines for infection like acyclovir, adefovir, amphotericin B, bacitracin, cidofovir, foscarnet, ganciclovir, gentamicin, pentamidine, vancomycin NSAIDs, medicines for pain and inflammation, like ibuprofen or naproxen other medicines that prolong the QT interval (an abnormal heart rhythm) pamidronate zoledronic acid This list may not describe all possible interactions. Give your health care provider a list of all the medicines, herbs, non-prescription drugs, or dietary supplements you use. Also tell them if you smoke, drink alcohol, or use illegal drugs. Some items may interact with your medicine. What should I watch for while using this medication? Your condition will be monitored carefully while you are receiving this medicine.  You may need blood work done while you are taking this medicine. This medicine may make you feel generally unwell. This is not uncommon as chemotherapy can affect healthy cells as well as cancer cells. Report any side effects. Continue your course of treatment even though you feel ill unless your healthcare professional tells you to stop. This medicine can make you more sensitive to cold. Do not drink cold drinks or use ice. Cover exposed skin before coming in contact with cold temperatures or cold objects. When out in cold weather wear warm clothing and cover your mouth and nose to warm the air  that goes into your lungs. Tell your doctor if you get sensitive to the cold. Do not become pregnant while taking this medicine or for 9 months after stopping it. Women should inform their health care professional if they wish to become pregnant or think they might be pregnant. Men should not father a child while taking this medicine and for 6 months after stopping it. There is potential for serious side effects to an unborn child. Talk to your health care professional for more information. Do not breast-feed a child while taking this medicine or for 3 months after stopping it. This medicine has caused ovarian failure in some women. This medicine may make it more difficult to get pregnant. Talk to your health care professional if you are concerned about your fertility. This medicine has caused decreased sperm counts in some men. This may make it more difficult to father a child. Talk to your health care professional if you are concerned about your fertility. This medicine may increase your risk of getting an infection. Call your health care professional for advice if you get a fever, chills, or sore throat, or other symptoms of a cold or flu. Do not treat yourself. Try to avoid being around people who are sick. Avoid taking medicines that contain aspirin, acetaminophen, ibuprofen, naproxen, or ketoprofen unless instructed by your health care professional. These medicines may hide a fever. Be careful brushing or flossing your teeth or using a toothpick because you may get an infection or bleed more easily. If you have any dental work done, tell your dentist you are receiving this medicine. What side effects may I notice from receiving this medication? Side effects that you should report to your doctor or health care professional as soon as possible: allergic reactions like skin rash, itching or hives, swelling of the face, lips, or tongue breathing problems cough low blood counts - this medicine may  decrease the number of white blood cells, red blood cells, and platelets. You may be at increased risk for infections and bleeding nausea, vomiting pain, redness, or irritation at site where injected pain, tingling, numbness in the hands or feet signs and symptoms of bleeding such as bloody or black, tarry stools; red or dark brown urine; spitting up blood or brown material that looks like coffee grounds; red spots on the skin; unusual bruising or bleeding from the eyes, gums, or nose signs and symptoms of a dangerous change in heartbeat or heart rhythm like chest pain; dizziness; fast, irregular heartbeat; palpitations; feeling faint or lightheaded; falls signs and symptoms of infection like fever; chills; cough; sore throat; pain or trouble passing urine signs and symptoms of liver injury like dark yellow or brown urine; general ill feeling or flu-like symptoms; light-colored stools; loss of appetite; nausea; right upper belly pain; unusually weak or tired; yellowing of the eyes or skin signs and symptoms of low  red blood cells or anemia such as unusually weak or tired; feeling faint or lightheaded; falls signs and symptoms of muscle injury like dark urine; trouble passing urine or change in the amount of urine; unusually weak or tired; muscle pain; back pain Side effects that usually do not require medical attention (report to your doctor or health care professional if they continue or are bothersome): changes in taste diarrhea gas hair loss loss of appetite mouth sores This list may not describe all possible side effects. Call your doctor for medical advice about side effects. You may report side effects to FDA at 1-800-FDA-1088. Where should I keep my medication? This drug is given in a hospital or clinic and will not be stored at home. NOTE: This sheet is a summary. It may not cover all possible information. If you have questions about this medicine, talk to your doctor, pharmacist, or  health care provider.  2023 Elsevier/Gold Standard (2020-12-29 00:00:00)  Irinotecan injection What is this medication? IRINOTECAN (ir in oh TEE kan ) is a chemotherapy drug. It is used to treat colon and rectal cancer. This medicine may be used for other purposes; ask your health care provider or pharmacist if you have questions. COMMON BRAND NAME(S): Camptosar What should I tell my care team before I take this medication? They need to know if you have any of these conditions: dehydration diarrhea infection (especially a virus infection such as chickenpox, cold sores, or herpes) liver disease low blood counts, like low white cell, platelet, or red cell counts low levels of calcium, magnesium, or potassium in the blood recent or ongoing radiation therapy an unusual or allergic reaction to irinotecan, other medicines, foods, dyes, or preservatives pregnant or trying to get pregnant breast-feeding How should I use this medication? This drug is given as an infusion into a vein. It is administered in a hospital or clinic by a specially trained health care professional. Talk to your pediatrician regarding the use of this medicine in children. Special care may be needed. Overdosage: If you think you have taken too much of this medicine contact a poison control center or emergency room at once. NOTE: This medicine is only for you. Do not share this medicine with others. What if I miss a dose? It is important not to miss your dose. Call your doctor or health care professional if you are unable to keep an appointment. What may interact with this medication? Do not take this medicine with any of the following medications: cobicistat itraconazole This medicine may interact with the following medications: antiviral medicines for HIV or AIDS certain antibiotics like rifampin or rifabutin certain medicines for fungal infections like ketoconazole, posaconazole, and voriconazole certain medicines  for seizures like carbamazepine, phenobarbital, phenotoin clarithromycin gemfibrozil nefazodone [facility]. John's Wort This list may not describe all possible interactions. Give your health care provider a list of all the medicines, herbs, non-prescription drugs, or dietary supplements you use. Also tell them if you smoke, drink alcohol, or use illegal drugs. Some items may interact with your medicine. What should I watch for while using this medication? Your condition will be monitored carefully while you are receiving this medicine. You will need important blood work done while you are taking this medicine. This drug may make you feel generally unwell. This is not uncommon, as chemotherapy can affect healthy cells as well as cancer cells. Report any side effects. Continue your course of treatment even though you feel ill unless your doctor tells you to stop.  In some cases, you may be given additional medicines to help with side effects. Follow all directions for their use. You may get drowsy or dizzy. Do not drive, use machinery, or do anything that needs mental alertness until you know how this medicine affects you. Do not stand or sit up quickly, especially if you are an older patient. This reduces the risk of dizzy or fainting spells. Call your health care professional for advice if you get a fever, chills, or sore throat, or other symptoms of a cold or flu. Do not treat yourself. This medicine decreases your body's ability to fight infections. Try to avoid being around people who are sick. Avoid taking products that contain aspirin, acetaminophen, ibuprofen, naproxen, or ketoprofen unless instructed by your doctor. These medicines may hide a fever. This medicine may increase your risk to bruise or bleed. Call your doctor or health care professional if you notice any unusual bleeding. Be careful brushing and flossing your teeth or using a toothpick because you may get an infection or bleed more easily.  If you have any dental work done, tell your dentist you are receiving this medicine. Do not become pregnant while taking this medicine or for 6 months after stopping it. Women should inform their health care professional if they wish to become pregnant or think they might be pregnant. Men should not father a child while taking this medicine and for 3 months after stopping it. There is potential for serious side effects to an unborn child. Talk to your health care professional for more information. Do not breast-feed an infant while taking this medicine or for 7 days after stopping it. This medicine has caused ovarian failure in some women. This medicine may make it more difficult to get pregnant. Talk to your health care professional if you are concerned about your fertility. This medicine has caused decreased sperm counts in some men. This may make it more difficult to father a child. Talk to your health care professional if you are concerned about your fertility. What side effects may I notice from receiving this medication? Side effects that you should report to your doctor or health care professional as soon as possible: allergic reactions like skin rash, itching or hives, swelling of the face, lips, or tongue chest pain diarrhea flushing, runny nose, sweating during infusion low blood counts - this medicine may decrease the number of white blood cells, red blood cells and platelets. You may be at increased risk for infections and bleeding. nausea, vomiting pain, swelling, warmth in the leg signs of decreased platelets or bleeding - bruising, pinpoint red spots on the skin, black, tarry stools, blood in the urine signs of infection - fever or chills, cough, sore throat, pain or difficulty passing urine signs of decreased red blood cells - unusually weak or tired, fainting spells, lightheadedness Side effects that usually do not require medical attention (report to your doctor or health care  professional if they continue or are bothersome): constipation hair loss headache loss of appetite mouth sores stomach pain This list may not describe all possible side effects. Call your doctor for medical advice about side effects. You may report side effects to FDA at 1-800-FDA-1088. Where should I keep my medication? This drug is given in a hospital or clinic and will not be stored at home. NOTE: This sheet is a summary. It may not cover all possible information. If you have questions about this medicine, talk to your doctor, pharmacist, or health care provider.  2023 Elsevier/Gold Standard (2020-12-29 00:00:00)  Leucovorin injection What is this medication? LEUCOVORIN (loo koe VOR in) is used to prevent or treat the harmful effects of some medicines. This medicine is used to treat anemia caused by a low amount of folic acid in the body. It is also used with 5-fluorouracil (5-FU) to treat colon cancer. This medicine may be used for other purposes; ask your health care provider or pharmacist if you have questions. What should I tell my care team before I take this medication? They need to know if you have any of these conditions: anemia from low levels of vitamin B-12 in the blood an unusual or allergic reaction to leucovorin, folic acid, other medicines, foods, dyes, or preservatives pregnant or trying to get pregnant breast-feeding How should I use this medication? This medicine is for injection into a muscle or into a vein. It is given by a health care professional in a hospital or clinic setting. Talk to your pediatrician regarding the use of this medicine in children. Special care may be needed. Overdosage: If you think you have taken too much of this medicine contact a poison control center or emergency room at once. NOTE: This medicine is only for you. Do not share this medicine with others. What if I miss a dose? This does not apply. What may interact with this  medication? capecitabine fluorouracil phenobarbital phenytoin primidone trimethoprim-sulfamethoxazole This list may not describe all possible interactions. Give your health care provider a list of all the medicines, herbs, non-prescription drugs, or dietary supplements you use. Also tell them if you smoke, drink alcohol, or use illegal drugs. Some items may interact with your medicine. What should I watch for while using this medication? Your condition will be monitored carefully while you are receiving this medicine. This medicine may increase the side effects of 5-fluorouracil, 5-FU. Tell your doctor or health care professional if you have diarrhea or mouth sores that do not get better or that get worse. What side effects may I notice from receiving this medication? Side effects that you should report to your doctor or health care professional as soon as possible: allergic reactions like skin rash, itching or hives, swelling of the face, lips, or tongue breathing problems fever, infection mouth sores unusual bleeding or bruising unusually weak or tired Side effects that usually do not require medical attention (report to your doctor or health care professional if they continue or are bothersome): constipation or diarrhea loss of appetite nausea, vomiting This list may not describe all possible side effects. Call your doctor for medical advice about side effects. You may report side effects to FDA at 1-800-FDA-1088. Where should I keep my medication? This drug is given in a hospital or clinic and will not be stored at home. NOTE: This sheet is a summary. It may not cover all possible information. If you have questions about this medicine, talk to your doctor, pharmacist, or health care provider.  2023 Elsevier/Gold Standard (2007-08-06 00:00:00)  Fluorouracil, 5-FU injection What is this medication? FLUOROURACIL, 5-FU (flure oh YOOR a sil) is a chemotherapy drug. It slows the growth  of cancer cells. This medicine is used to treat many types of cancer like breast cancer, colon or rectal cancer, pancreatic cancer, and stomach cancer. This medicine may be used for other purposes; ask your health care provider or pharmacist if you have questions. COMMON BRAND NAME(S): Adrucil What should I tell my care team before I take this medication? They need to know if  you have any of these conditions: blood disorders dihydropyrimidine dehydrogenase (DPD) deficiency infection (especially a virus infection such as chickenpox, cold sores, or herpes) kidney disease liver disease malnourished, poor nutrition recent or ongoing radiation therapy an unusual or allergic reaction to fluorouracil, other chemotherapy, other medicines, foods, dyes, or preservatives pregnant or trying to get pregnant breast-feeding How should I use this medication? This drug is given as an infusion or injection into a vein. It is administered in a hospital or clinic by a specially trained health care professional. Talk to your pediatrician regarding the use of this medicine in children. Special care may be needed. Overdosage: If you think you have taken too much of this medicine contact a poison control center or emergency room at once. NOTE: This medicine is only for you. Do not share this medicine with others. What if I miss a dose? It is important not to miss your dose. Call your doctor or health care professional if you are unable to keep an appointment. What may interact with this medication? Do not take this medicine with any of the following medications: live virus vaccines This medicine may also interact with the following medications: medicines that treat or prevent blood clots like warfarin, enoxaparin, and dalteparin This list may not describe all possible interactions. Give your health care provider a list of all the medicines, herbs, non-prescription drugs, or dietary supplements you use. Also tell  them if you smoke, drink alcohol, or use illegal drugs. Some items may interact with your medicine. What should I watch for while using this medication? Visit your doctor for checks on your progress. This drug may make you feel generally unwell. This is not uncommon, as chemotherapy can affect healthy cells as well as cancer cells. Report any side effects. Continue your course of treatment even though you feel ill unless your doctor tells you to stop. In some cases, you may be given additional medicines to help with side effects. Follow all directions for their use. Call your doctor or health care professional for advice if you get a fever, chills or sore throat, or other symptoms of a cold or flu. Do not treat yourself. This drug decreases your body's ability to fight infections. Try to avoid being around people who are sick. This medicine may increase your risk to bruise or bleed. Call your doctor or health care professional if you notice any unusual bleeding. Be careful brushing and flossing your teeth or using a toothpick because you may get an infection or bleed more easily. If you have any dental work done, tell your dentist you are receiving this medicine. Avoid taking products that contain aspirin, acetaminophen, ibuprofen, naproxen, or ketoprofen unless instructed by your doctor. These medicines may hide a fever. Do not become pregnant while taking this medicine. Women should inform their doctor if they wish to become pregnant or think they might be pregnant. There is a potential for serious side effects to an unborn child. Talk to your health care professional or pharmacist for more information. Do not breast-feed an infant while taking this medicine. Men should inform their doctor if they wish to father a child. This medicine may lower sperm counts. Do not treat diarrhea with over the counter products. Contact your doctor if you have diarrhea that lasts more than 2 days or if it is severe and  watery. This medicine can make you more sensitive to the sun. Keep out of the sun. If you cannot avoid being in the sun, wear  protective clothing and use sunscreen. Do not use sun lamps or tanning beds/booths. What side effects may I notice from receiving this medication? Side effects that you should report to your doctor or health care professional as soon as possible: allergic reactions like skin rash, itching or hives, swelling of the face, lips, or tongue low blood counts - this medicine may decrease the number of white blood cells, red blood cells and platelets. You may be at increased risk for infections and bleeding. signs of infection - fever or chills, cough, sore throat, pain or difficulty passing urine signs of decreased platelets or bleeding - bruising, pinpoint red spots on the skin, black, tarry stools, blood in the urine signs of decreased red blood cells - unusually weak or tired, fainting spells, lightheadedness breathing problems changes in vision chest pain mouth sores nausea and vomiting pain, swelling, redness at site where injected pain, tingling, numbness in the hands or feet redness, swelling, or sores on hands or feet stomach pain unusual bleeding Side effects that usually do not require medical attention (report to your doctor or health care professional if they continue or are bothersome): changes in finger or toe nails diarrhea dry or itchy skin hair loss headache loss of appetite sensitivity of eyes to the light stomach upset unusually teary eyes This list may not describe all possible side effects. Call your doctor for medical advice about side effects. You may report side effects to FDA at 1-800-FDA-1088. Where should I keep my medication? This drug is given in a hospital or clinic and will not be stored at home. NOTE: This sheet is a summary. It may not cover all possible information. If you have questions about this medicine, talk to your doctor,  pharmacist, or health care provider.  2023 Elsevier/Gold Standard (2020-12-29 00:00:00)

## 2021-08-22 NOTE — Progress Notes (Signed)
Patient seen by Ned Card NP today  Vitals are within treatment parameters.  Labs reviewed by Ned Card NP and are within treatment parameters.  Per physician team, patient is ready for treatment and there are NO modifications to the treatment plan. Today will be 1st day irinotecan is given.

## 2021-08-22 NOTE — Progress Notes (Signed)
Patient requesting IV fluids on pump d/c day. OK per NP. Orders placed.

## 2021-08-23 ENCOUNTER — Encounter: Payer: Self-pay | Admitting: *Deleted

## 2021-08-23 NOTE — Progress Notes (Signed)
PATIENT NAVIGATOR PROGRESS NOTE  Name: Tara Jensen Date: 08/23/2021 MRN: [id number]  DOB: February 10, 1966   Reason for visit:  Call after first [place] treatment  Comments:  Spoke with patient today after 2nd cycle chemo with Irinotecan added to regimen. Slight nausea today but able to eat and drink, normal bowel movement today.   Reviewed upcoming appt and how to take anti nausea meds.  Verbalized understanding    Time spent counseling/coordinating care: 30-45 minutes

## 2021-08-24 ENCOUNTER — Inpatient Hospital Stay: Payer: BC Managed Care – PPO

## 2021-08-24 VITALS — BP 118/77 | HR 63 | Temp 98.1°F | Resp 18

## 2021-08-24 DIAGNOSIS — C251 Malignant neoplasm of body of pancreas: Secondary | ICD-10-CM | POA: Diagnosis not present

## 2021-08-24 MED ORDER — SODIUM CHLORIDE 0.9% FLUSH
10.0000 mL | INTRAVENOUS | Status: DC | PRN
  Administered 2021-08-24: 10 mL

## 2021-08-24 MED ORDER — HEPARIN SOD (PORK) LOCK FLUSH 100 UNIT/ML IV SOLN
500.0000 [IU] | Freq: Once | INTRAVENOUS | Status: AC | PRN
  Administered 2021-08-24: 500 [IU]

## 2021-08-24 MED ORDER — SODIUM CHLORIDE 0.9 % IV SOLN: INTRAVENOUS | Status: AC

## 2021-08-24 MED ORDER — PEGFILGRASTIM-CBQV 6 MG/0.6ML ~~LOC~~ SOSY
6.0000 mg | PREFILLED_SYRINGE | Freq: Once | SUBCUTANEOUS | Status: AC
  Administered 2021-08-24: 6 mg via SUBCUTANEOUS

## 2021-08-24 NOTE — Patient Instructions (Addendum)
Pegfilgrastim Injection What is this medication? PEGFILGRASTIM (PEG fil gra stim) lowers the risk of infection in people who are receiving chemotherapy. It works by helping your body make more white blood cells, which protects your body from infection. It may also be used to help people who have been exposed to high doses of radiation. This medicine may be used for other purposes; ask your health care provider or pharmacist if you have questions. COMMON BRAND NAME(S): Fulphila, Fylnetra, Neulasta, Nyvepria, Stimufend, UDENYCA, Ziextenzo What should I tell my care team before I take this medication? They need to know if you have any of these conditions: Kidney disease Latex allergy Ongoing radiation therapy Sickle cell disease Skin reactions to acrylic adhesives (On-Body Injector only) An unusual or allergic reaction to pegfilgrastim, filgrastim, other medications, foods, dyes, or preservatives Pregnant or trying to get pregnant Breast-feeding How should I use this medication? This medication is for injection under the skin. If you get this medication at home, you will be taught how to prepare and give the pre-filled syringe or how to use the On-body Injector. Refer to the patient Instructions for Use for detailed instructions. Use exactly as directed. Tell your care team immediately if you suspect that the On-body Injector may not have performed as intended or if you suspect the use of the On-body Injector resulted in a missed or partial dose. It is important that you put your used needles and syringes in a special sharps container. Do not put them in a trash can. If you do not have a sharps container, call your pharmacist or care team to get one. Talk to your care team about the use of this medication in children. While this medication may be prescribed for selected conditions, precautions do apply. Overdosage: If you think you have taken too much of this medicine contact a poison control center  or emergency room at once. NOTE: This medicine is only for you. Do not share this medicine with others. What if I miss a dose? It is important not to miss your dose. Call your care team if you miss your dose. If you miss a dose due to an On-body Injector failure or leakage, a new dose should be administered as soon as possible using a single prefilled syringe for manual use. What may interact with this medication? Interactions have not been studied. This list may not describe all possible interactions. Give your health care provider a list of all the medicines, herbs, non-prescription drugs, or dietary supplements you use. Also tell them if you smoke, drink alcohol, or use illegal drugs. Some items may interact with your medicine. What should I watch for while using this medication? Your condition will be monitored carefully while you are receiving this medication. You may need blood work done while you are taking this medication. Talk to your care team about your risk of cancer. You may be more at risk for certain types of cancer if you take this medication. If you are going to need a MRI, CT scan, or other procedure, tell your care team that you are using this medication (On-Body Injector only). What side effects may I notice from receiving this medication? Side effects that you should report to your care team as soon as possible: Allergic reactions--skin rash, itching, hives, swelling of the face, lips, tongue, or throat Capillary leak syndrome--stomach or muscle pain, unusual weakness or fatigue, feeling faint or lightheaded, decrease in the amount of urine, swelling of the ankles, hands, or feet, trouble   breathing High white blood cell level--fever, fatigue, trouble breathing, night sweats, change in vision, weight loss Inflammation of the aorta--fever, fatigue, back, chest, or stomach pain, severe headache Kidney injury (glomerulonephritis)--decrease in the amount of urine, red or dark brown  urine, foamy or bubbly urine, swelling of the ankles, hands, or feet Shortness of breath or trouble breathing Spleen injury--pain in upper left stomach or shoulder Unusual bruising or bleeding Side effects that usually do not require medical attention (report to your care team if they continue or are bothersome): Bone pain Pain in the hands or feet This list may not describe all possible side effects. Call your doctor for medical advice about side effects. You may report side effects to FDA at 1-800-FDA-1088. Where should I keep my medication? Keep out of the reach of children. If you are using this medication at home, you will be instructed on how to store it. Throw away any unused medication after the expiration date on the label. NOTE: This sheet is a summary. It may not cover all possible information. If you have questions about this medicine, talk to your doctor, pharmacist, or health care provider.  2023 Elsevier/Gold Standard (2020-12-29 00:00:00) Rehydration, Adult Rehydration is the replacement of body fluids, salts, and minerals (electrolytes) that are lost during dehydration. Dehydration is when there is not enough water or other fluids in the body. This happens when you lose more fluids than you take in. Common causes of dehydration include: Not drinking enough fluids. This can occur when you are ill or doing activities that require a lot of energy, especially in hot weather. Conditions that cause loss of water or other fluids, such as diarrhea, vomiting, sweating, or urinating a lot. Other illnesses, such as fever or infection. Certain medicines, such as those that remove excess fluid from the body (diuretics). Symptoms of mild or moderate dehydration may include thirst, dry lips and mouth, and dizziness. Symptoms of severe dehydration may include increased heart rate, confusion, fainting, and not urinating. For severe dehydration, you may need to get fluids through an IV at the  hospital. For mild or moderate dehydration, you can usually rehydrate at home by drinking certain fluids as told by your health care provider. What are the risks? Generally, rehydration is safe. However, taking in too much fluid (overhydration) can be a problem. This is rare. Overhydration can cause an electrolyte imbalance, kidney failure, or a decrease in salt (sodium) levels in the body. Supplies needed You will need an oral rehydration solution (ORS) if your health care provider tells you to use one. This is a drink to treat dehydration. It can be found in pharmacies and retail stores. How to rehydrate Fluids Follow instructions from your health care provider for rehydration. The kind of fluid and the amount you should drink depend on your condition. In general, you should choose drinks that you prefer. If told by your health care provider, drink an ORS. Make an ORS by following instructions on the package. Start by drinking small amounts, about  cup (120 mL) every 5-10 minutes. Slowly increase how much you drink until you have taken the amount recommended by your health care provider. Drink enough clear fluids to keep your urine pale yellow. If you were told to drink an ORS, finish it first, then start slowly drinking other clear fluids. Drink fluids such as: Water. This includes sparkling water and flavored water. Drinking only water can lead to having too little sodium in your body (hyponatremia). Follow the advice of  your health care provider. Water from ice chips you suck on. Fruit juice with water you add to it (diluted). Sports drinks. Hot or cold herbal teas. Broth-based soups. Milk or milk products. Food Follow instructions from your health care provider about what to eat while you rehydrate. Your health care provider may recommend that you slowly begin eating regular foods in small amounts. Eat foods that contain a healthy balance of electrolytes, such as bananas, oranges,  potatoes, tomatoes, and spinach. Avoid foods that are greasy or contain a lot of sugar. In some cases, you may get nutrition through a feeding tube that is passed through your nose and into your stomach (nasogastric tube, or NG tube). This may be done if you have uncontrolled vomiting or diarrhea. Beverages to avoid  Certain beverages may make dehydration worse. While you rehydrate, avoid drinking alcohol. How to tell if you are recovering from dehydration You may be recovering from dehydration if: You are urinating more often than before you started rehydrating. Your urine is pale yellow. Your energy level improves. You vomit less frequently. You have diarrhea less frequently. Your appetite improves or returns to normal. You feel less dizzy or less light-headed. Your skin tone and color start to look more normal. Follow these instructions at home: Take over-the-counter and prescription medicines only as told by your health care provider. Do not take sodium tablets. Doing this can lead to having too much sodium in your body (hypernatremia). Contact a health care provider if: You continue to have symptoms of mild or moderate dehydration, such as: Thirst. Dry lips. Slightly dry mouth. Dizziness. Dark urine or less urine than normal. Muscle cramps. You continue to vomit or have diarrhea. Get help right away if you: Have symptoms of dehydration that get worse. Have a fever. Have a severe headache. Have been vomiting and the following happens: Your vomiting gets worse or does not go away. Your vomit includes blood or green matter (bile). You cannot eat or drink without vomiting. Have problems with urination or bowel movements, such as: Diarrhea that gets worse or does not go away. Blood in your stool (feces). This may cause stool to look black and tarry. Not urinating, or urinating only a small amount of very dark urine, within 6-8 hours. Have trouble breathing. Have symptoms that  get worse with treatment. These symptoms may represent a serious problem that is an emergency. Do not wait to see if the symptoms will go away. Get medical help right away. Call your local emergency services (911 in the [place] Do not drive yourself to the hospital. Summary Rehydration is the replacement of body fluids and minerals (electrolytes) that are lost during dehydration. Follow instructions from your health care provider for rehydration. The kind of fluid and amount you should drink depend on your condition. Slowly increase how much you drink until you have taken the amount recommended by your health care provider. Contact your health care provider if you continue to show signs of mild or moderate dehydration. This information is not intended to replace advice given to you by your health care provider. Make sure you discuss any questions you have with your health care provider. Document Revised: 03/31/2019 Document Reviewed: 02/08/2019 Elsevier Patient Education  [place] An implanted port is a device that is placed under the skin. It is usually placed in the chest. The device may vary based on the need. Implanted ports can be used to give IV medicine, to  take blood, or to give fluids. You may have an implanted port if: You need IV medicine that would be irritating to the small veins in your hands or arms. You need IV medicines, such as chemotherapy, for a long period of time. You need IV nutrition for a long period of time. You may have fewer limitations when using a port than you would if you used other types of long-term IVs. You will also likely be able to return to normal activities after your incision heals. An implanted port has two main parts: Reservoir. The reservoir is the part where a needle is inserted to give medicines or draw blood. The reservoir is round. After the port is placed, it appears as a small, raised area under your  skin. Catheter. The catheter is a small, thin tube that connects the reservoir to a vein. Medicine that is inserted into the reservoir goes into the catheter and then into the vein. How is my port accessed? To access your port: A numbing cream may be placed on the skin over the port site. Your health care provider will put on a mask and sterile gloves. The skin over your port will be cleaned carefully with a germ-killing soap and allowed to dry. Your health care provider will gently pinch the port and insert a needle into it. Your health care provider will check for a blood return to make sure the port is in the vein and is still working (patent). If your port needs to remain accessed to get medicine continuously (constant infusion), your health care provider will place a clear bandage (dressing) over the needle site. The dressing and needle will need to be changed every week, or as told by your health care provider. What is flushing? Flushing helps keep the port working. Follow instructions from your health care provider about how and when to flush the port. Ports are usually flushed with saline solution or a medicine called heparin. The need for flushing will depend on how the port is used: If the port is only used from time to time to give medicines or draw blood, the port may need to be flushed: Before and after medicines have been given. Before and after blood has been drawn. As part of routine maintenance. Flushing may be recommended every 4-6 weeks. If a constant infusion is running, the port may not need to be flushed. Throw away any syringes in a disposal container that is meant for sharp items (sharps container). You can buy a sharps container from a pharmacy, or you can make one by using an empty hard plastic bottle with a cover. How long will my port stay implanted? The port can stay in for as long as your health care provider thinks it is needed. When it is time for the port to come  out, a surgery will be done to remove it. The surgery will be similar to the procedure that was done to put the port in. Follow these instructions at home: Caring for your port and port site Flush your port as told by your health care provider. If you need an infusion over several days, follow instructions from your health care provider about how to take care of your port site. Make sure you: Change your dressing as told by your health care provider. Wash your hands with soap and water for at least 20 seconds before and after you change your dressing. If soap and water are not available, use alcohol-based hand sanitizer. Place any  used dressings or infusion bags into a plastic bag. Throw that bag in the trash. Keep the dressing that covers the needle clean and dry. Do not get it wet. Do not use scissors or sharp objects near the infusion tubing. Keep any external tubes clamped, unless they are being used. Check your port site every day for signs of infection. Check for: Redness, swelling, or pain. Fluid or blood. Warmth. Pus or a bad smell. Protect the skin around the port site. Avoid wearing bra straps that rub or irritate the site. Protect the skin around your port from seat belts. Place a soft pad over your chest if needed. Bathe or shower as told by your health care provider. The site may get wet as long as you are not actively receiving an infusion. General instructions  Return to your normal activities as told by your health care provider. Ask your health care provider what activities are safe for you. Carry a medical alert card or wear a medical alert bracelet at all times. This will let health care providers know that you have an implanted port in case of an emergency. Where to find more information [employer] Society: www.cancer.[place] [employer]: www.cancer.net Contact a health care provider if: You have a fever or chills. You have redness,  swelling, or pain at the port site. You have fluid or blood coming from your port site. Your incision feels warm to the touch. You have pus or a bad smell coming from the port site. Summary Implanted ports are usually placed in the chest for long-term IV access. Follow instructions from your health care provider about flushing the port and changing bandages (dressings). Take care of the area around your port by avoiding clothing that puts pressure on the area, and by watching for signs of infection. Protect the skin around your port from seat belts. Place a soft pad over your chest if needed. Contact a health care provider if you have a fever or you have redness, swelling, pain, fluid, or a bad smell at the port site. This information is not intended to replace advice given to you by your health care provider. Make sure you discuss any questions you have with your health care provider. Document Revised: 08/01/2020 Document Reviewed: 08/01/2020 Elsevier Patient Education  Tara Jensen.

## 2021-08-27 ENCOUNTER — Other Ambulatory Visit: Payer: Self-pay | Admitting: Nurse Practitioner

## 2021-08-28 ENCOUNTER — Encounter: Payer: Self-pay | Admitting: Oncology

## 2021-09-01 ENCOUNTER — Other Ambulatory Visit: Payer: Self-pay | Admitting: Oncology

## 2021-09-03 ENCOUNTER — Other Ambulatory Visit: Payer: Self-pay

## 2021-09-04 ENCOUNTER — Encounter: Payer: Self-pay | Admitting: Genetic Counselor

## 2021-09-04 ENCOUNTER — Inpatient Hospital Stay: Payer: BC Managed Care – PPO

## 2021-09-04 ENCOUNTER — Inpatient Hospital Stay (HOSPITAL_BASED_OUTPATIENT_CLINIC_OR_DEPARTMENT_OTHER): Payer: BC Managed Care – PPO | Admitting: Genetic Counselor

## 2021-09-04 ENCOUNTER — Other Ambulatory Visit: Payer: Self-pay

## 2021-09-04 DIAGNOSIS — C251 Malignant neoplasm of body of pancreas: Secondary | ICD-10-CM | POA: Diagnosis not present

## 2021-09-04 DIAGNOSIS — Z1379 Encounter for other screening for genetic and chromosomal anomalies: Secondary | ICD-10-CM | POA: Diagnosis not present

## 2021-09-04 NOTE — Progress Notes (Signed)
REFERRING PROVIDER: Ladell Pier, MD [address] [place],  [place] [address]  PRIMARY PROVIDER:  Chesley Noon, MD  PRIMARY REASON FOR VISIT:  1. Cancer of pancreas, body ([place])   2. Genetic testing     HISTORY OF PRESENT ILLNESS:   Tara Jensen, a 56 y.o. female, was seen for a [facility] [place] cancer genetics consultation at the request of Dr. Benay Spice due to a personal history of cancer.  Tara Jensen presents to clinic today to discuss the possibility of a hereditary predisposition to cancer, to discuss genetic testing, and to further clarify her future cancer risks, as well as potential cancer risks for family members.   Tara Jensen was diagnosed with pancreatic cancer at age 49.  CANCER HISTORY:  Oncology History  Cancer of pancreas, body ([place])  07/24/2021 Initial Diagnosis   Cancer of pancreas, body ([place])   07/24/2021 Cancer Staging   Staging form: Exocrine Pancreas, AJCC 8th Edition - Clinical: Stage III (cT4, cN0, cM0) - Signed by Ladell Pier, MD on 07/24/2021 Total positive nodes: 0   08/08/2021 -  Chemotherapy   Patient is on Treatment Plan : PANCREAS Modified FOLFIRINOX q14d x 8 cycles       Past Medical History:  Diagnosis Date   Abnormal Pap smear    Anxiety    "situational"   BV (bacterial vaginosis)    Cancer ([place])    pancreatic   Cervical polyp    Depression    "situational"   Family history of adverse reaction to anesthesia    mother had N/V   Recurrent UTI (urinary tract infection)     Past Surgical History:  Procedure Laterality Date   BIOPSY  07/16/2021   Procedure: BIOPSY;  Surgeon: Irving Copas., MD;  Location: [facility];  Service: Gastroenterology;;   CERVICAL CONE BIOPSY  1996   ESOPHAGOGASTRODUODENOSCOPY (EGD) WITH PROPOFOL N/A 07/16/2021   Procedure: ESOPHAGOGASTRODUODENOSCOPY (EGD) WITH PROPOFOL;  Surgeon: Irving Copas., MD;  Location: [facility];  Service: Gastroenterology;  Laterality: N/A;   FINE NEEDLE  ASPIRATION  07/16/2021   Procedure: FINE NEEDLE ASPIRATION (FNA) LINEAR;  Surgeon: Irving Copas., MD;  Location: [facility] ENDOSCOPY;  Service: Gastroenterology;;   LASIK     PORTACATH PLACEMENT Right 08/01/2021   Procedure: INSERTION PORT-A-CATH;  Surgeon: Dwan Bolt, MD;  Location: [facility];  Service: General;  Laterality: Right;   UPPER ESOPHAGEAL ENDOSCOPIC ULTRASOUND (EUS) N/A 07/16/2021   Procedure: UPPER ESOPHAGEAL ENDOSCOPIC ULTRASOUND (EUS);  Surgeon: Irving Copas., MD;  Location: [facility];  Service: Gastroenterology;  Laterality: N/A;    Social History   Socioeconomic History   Marital status: Divorced    Spouse name: Not on file   Number of children: Not on file   Years of education: Not on file   Highest education level: Not on file  Occupational History   Not on file  Tobacco Use   Smoking status: Never   Smokeless tobacco: Never  Vaping Use   Vaping Use: Never used  Substance and Sexual Activity   Alcohol use: Not Currently   Drug use: No   Sexual activity: Not Currently    Birth control/protection: Inserts  Other Topics Concern   Not on file  Social History Narrative   Not on file   Social Determinants of Health   Financial Resource Strain: Low Risk  (09/08/2019)   Overall Financial Resource Strain (CARDIA)    Difficulty of Paying Living Expenses: Not hard at all  Food Insecurity: No Food Insecurity (09/08/2019)   Hunger Vital Sign    Worried About Running Out of Food in the Last Year: Never true    Ran Out of Food in the Last Year: Never true  Transportation Needs: No Transportation Needs (09/08/2019)   PRAPARE - Hydrologist (Medical): No    Lack of Transportation (Non-Medical): No  Physical Activity: Sufficiently Active (09/08/2019)   Exercise Vital Sign    Days of Exercise per Week: 7 days    Minutes of Exercise per Session: 60 min  Stress: No Stress Concern Present (09/08/2019)   [place]    Feeling of Stress : Not at all  Social Connections: Moderately Integrated (09/08/2019)   Social Connection and Isolation Panel [NHANES]    Frequency of Communication with Friends and Family: More than three times a week    Frequency of Social Gatherings with Friends and Family: Twice a week    Attends Religious Services: More than 4 times per year    Active Member of [employer] or Organizations: Yes    Attends Music therapist: More than 4 times per year    Marital Status: Divorced     FAMILY HISTORY:  We obtained a detailed, 4-generation family history.  Significant diagnoses are listed below: Family History  Problem Relation Age of Onset   Depression Mother    Depression Brother    Stroke Maternal Grandmother    Hypertension Maternal Grandfather    Breast cancer Cousin 56       maternal first cousin   Colon cancer Neg Hx    Esophageal cancer Neg Hx    Rectal cancer Neg Hx    Stomach cancer Neg Hx      Tara Jensen's maternal first cousin was diagnosed with breast cancer at age 11, the cancer has metastasized. Tara Jensen is unaware of previous family history of genetic testing for hereditary cancer risks.   GENETIC TEST RESULTS:  The [facility] CancerNext-Expanded Panel found no pathogenic mutations.  The CancerNext-Expanded gene panel offered by [facility] and includes sequencing, rearrangement, and RNA analysis for the following 77 genes: AIP, ALK, APC, ATM, AXIN2, BAP1, BARD1, BLM, BMPR1A, BRCA1, BRCA2, BRIP1, CDC73, CDH1, CDK4, CDKN1B, CDKN2A, CHEK2, CTNNA1, DICER1, FANCC, FH, FLCN, GALNT12, KIF1B, LZTR1, MAX, MEN1, MET, MLH1, MSH2, MSH3, MSH6, MUTYH, NBN, NF1, NF2, NTHL1, PALB2, PHOX2B, PMS2, POT1, PRKAR1A, PTCH1, PTEN, RAD51C, RAD51D, RB1, RECQL, RET, SDHA, SDHAF2, SDHB, SDHC, SDHD, SMAD4, SMARCA4, SMARCB1, SMARCE1, STK11, SUFU, TMEM127, TP53, TSC1, TSC2, VHL and XRCC2 (sequencing and deletion/duplication); EGFR,  EGLN1, HOXB13, KIT, MITF, PDGFRA, POLD1, and POLE (sequencing only); EPCAM and GREM1 (deletion/duplication only).   The test report has been scanned into EPIC and is located under the Molecular Pathology section of the Results Review tab.  A portion of the result report is included below for reference. Genetic testing reported out on 08/28/2021.       Even though a pathogenic variant was not identified, possible explanations for the cancer in the family may include: There may be no hereditary risk for cancer in the family. The cancers in Tara Jensen and/or her family may be due to other genetic or environmental factors. There may be a gene mutation in one of these genes that current testing methods cannot detect, but that chance is small. There could be another gene that has not yet been discovered, or that we have not yet tested, that is  responsible for the cancer diagnoses in the family.   Therefore, it is important to remain in touch with cancer genetics in the future so that we can continue to offer Tara Jensen the most up to date genetic testing.   ADDITIONAL GENETIC TESTING:  We discussed with Tara Jensen that her genetic testing was fairly extensive.  If there are genes identified to increase cancer risk that can be analyzed in the future, we would be happy to discuss and coordinate this testing at that time.    CANCER SCREENING RECOMMENDATIONS:  Tara Jensen test result is considered negative (normal).  This means that we have not identified a hereditary cause for her personal history of cancer at this time.   An individual's cancer risk and medical management are not determined by genetic test results alone. Overall cancer risk assessment incorporates additional factors, including personal medical history, family history, and any available genetic information that may result in a personalized plan for cancer prevention and surveillance. Therefore, it is recommended she continue to follow  the cancer management and screening guidelines provided by her oncology and primary healthcare provider.  Tara Jensen questions were answered to her satisfaction today. Our contact information was provided should additional questions or concerns arise. Thank you for the referral and allowing [place] to share in the care of your patient.   Lucille Passy, MS, [facility] Genetic Counselor [email or web address] (P) [phone]  The patient was seen for a total of 40 minutes in face-to-face genetic counseling.  The patient brought her friend. Drs. Lindi Adie and/or Burr Medico were available to discuss this case as needed.   _______________________________________________________________________ For Office Staff:  Number of people involved in session: 2 Was an Intern/ student involved with case: no

## 2021-09-05 ENCOUNTER — Inpatient Hospital Stay: Payer: BC Managed Care – PPO

## 2021-09-05 ENCOUNTER — Encounter: Payer: Self-pay | Admitting: *Deleted

## 2021-09-05 ENCOUNTER — Other Ambulatory Visit: Payer: Self-pay

## 2021-09-05 ENCOUNTER — Encounter: Payer: Self-pay | Admitting: Oncology

## 2021-09-05 ENCOUNTER — Inpatient Hospital Stay (HOSPITAL_BASED_OUTPATIENT_CLINIC_OR_DEPARTMENT_OTHER): Payer: BC Managed Care – PPO | Admitting: Oncology

## 2021-09-05 VITALS — BP 114/70 | HR 83 | Resp 18

## 2021-09-05 VITALS — BP 116/77 | HR 72 | Temp 98.1°F | Resp 18 | Ht 70.0 in | Wt 107.6 lb

## 2021-09-05 DIAGNOSIS — C251 Malignant neoplasm of body of pancreas: Secondary | ICD-10-CM

## 2021-09-05 LAB — CBC WITH DIFFERENTIAL (CANCER CENTER ONLY)
Abs Immature Granulocytes: 0.18 10*3/uL — ABNORMAL HIGH (ref 0.00–0.07)
Basophils Absolute: 0 10*3/uL (ref 0.0–0.1)
Basophils Relative: 0 %
Eosinophils Absolute: 0.5 10*3/uL (ref 0.0–0.5)
Eosinophils Relative: 5 %
HCT: 32.6 % — ABNORMAL LOW (ref 36.0–46.0)
Hemoglobin: 10.5 g/dL — ABNORMAL LOW (ref 12.0–15.0)
Immature Granulocytes: 2 %
Lymphocytes Relative: 14 %
Lymphs Abs: 1.5 10*3/uL (ref 0.7–4.0)
MCH: 31.3 pg (ref 26.0–34.0)
MCHC: 32.2 g/dL (ref 30.0–36.0)
MCV: 97.3 fL (ref 80.0–100.0)
Monocytes Absolute: 0.5 10*3/uL (ref 0.1–1.0)
Monocytes Relative: 5 %
Neutro Abs: 7.7 10*3/uL (ref 1.7–7.7)
Neutrophils Relative %: 74 %
Platelet Count: 119 10*3/uL — ABNORMAL LOW (ref 150–400)
RBC: 3.35 MIL/uL — ABNORMAL LOW (ref 3.87–5.11)
RDW: 16.1 % — ABNORMAL HIGH (ref 11.5–15.5)
WBC Count: 10.4 10*3/uL (ref 4.0–10.5)
nRBC: 0 % (ref 0.0–0.2)

## 2021-09-05 LAB — CMP (CANCER CENTER ONLY)
ALT: 97 U/L — ABNORMAL HIGH (ref 0–44)
AST: 55 U/L — ABNORMAL HIGH (ref 15–41)
Albumin: 4.2 g/dL (ref 3.5–5.0)
Alkaline Phosphatase: 99 U/L (ref 38–126)
Anion gap: 9 (ref 5–15)
BUN: 22 mg/dL — ABNORMAL HIGH (ref 6–20)
CO2: 26 mmol/L (ref 22–32)
Calcium: 9.7 mg/dL (ref 8.9–10.3)
Chloride: 105 mmol/L (ref 98–111)
Creatinine: 0.64 mg/dL (ref 0.44–1.00)
GFR, Estimated: >60 mL/min (ref >60)
Glucose, Bld: 163 mg/dL — ABNORMAL HIGH (ref 70–99)
Potassium: 4 mmol/L (ref 3.5–5.1)
Sodium: 140 mmol/L (ref 135–145)
Total Bilirubin: 0.3 mg/dL (ref 0.3–1.2)
Total Protein: 6.8 g/dL (ref 6.5–8.1)

## 2021-09-05 MED ORDER — TRAMADOL HCL 50 MG PO TABS: 50.0000 mg | ORAL_TABLET | Freq: Four times a day (QID) | ORAL | 0 refills | Status: DC | PRN

## 2021-09-05 MED ORDER — OXALIPLATIN CHEMO INJECTION 100 MG/20ML
85.0000 mg/m2 | Freq: Once | INTRAVENOUS | Status: AC
  Administered 2021-09-05: 130 mg via INTRAVENOUS
  Filled 2021-09-05: qty 10

## 2021-09-05 MED ORDER — DEXTROSE 5 % IV SOLN: Freq: Once | INTRAVENOUS | Status: AC

## 2021-09-05 MED ORDER — PALONOSETRON HCL INJECTION 0.25 MG/5ML
0.2500 mg | Freq: Once | INTRAVENOUS | Status: AC
  Administered 2021-09-05: 0.25 mg via INTRAVENOUS
  Filled 2021-09-05: qty 5

## 2021-09-05 MED ORDER — SODIUM CHLORIDE 0.9 % IV SOLN
400.0000 mg/m2 | Freq: Once | INTRAVENOUS | Status: AC
  Administered 2021-09-05: 620 mg via INTRAVENOUS
  Filled 2021-09-05: qty 31

## 2021-09-05 MED ORDER — SODIUM CHLORIDE 0.9% FLUSH: 10.0000 mL | INTRAVENOUS | Status: DC | PRN

## 2021-09-05 MED ORDER — SODIUM CHLORIDE 0.9 % IV SOLN
150.0000 mg | Freq: Once | INTRAVENOUS | Status: AC
  Administered 2021-09-05: 150 mg via INTRAVENOUS
  Filled 2021-09-05: qty 5

## 2021-09-05 MED ORDER — DICLOFENAC SODIUM 1 % EX GEL: 2.0000 g | Freq: Four times a day (QID) | CUTANEOUS | 5 refills | Status: DC | PRN

## 2021-09-05 MED ORDER — ATROPINE SULFATE 1 MG/ML IV SOLN
0.5000 mg | Freq: Once | INTRAVENOUS | Status: AC | PRN
  Administered 2021-09-05: 0.5 mg via INTRAVENOUS
  Filled 2021-09-05: qty 1

## 2021-09-05 MED ORDER — SODIUM CHLORIDE 0.9 % IV SOLN
10.0000 mg | Freq: Once | INTRAVENOUS | Status: AC
  Administered 2021-09-05: 10 mg via INTRAVENOUS
  Filled 2021-09-05: qty 1

## 2021-09-05 MED ORDER — SODIUM CHLORIDE 0.9 % IV SOLN
150.0000 mg/m2 | Freq: Once | INTRAVENOUS | Status: AC
  Administered 2021-09-05: 240 mg via INTRAVENOUS
  Filled 2021-09-05: qty 10

## 2021-09-05 MED ORDER — SODIUM CHLORIDE 0.9 % IV SOLN
2400.0000 mg/m2 | INTRAVENOUS | Status: DC
  Administered 2021-09-05: 3700 mg via INTRAVENOUS
  Filled 2021-09-05: qty 74

## 2021-09-05 NOTE — Progress Notes (Addendum)
Patient seen by Dr. Benay Spice today  Vitals are within treatment parameters.  Labs reviewed by Dr. Benay Spice and are within treatment parameters. Per patient LMP was over 1 year ago OK to treat with elevated ALT since total bili is normal. Per physician team, patient is ready for treatment and there are NO modifications to the treatment plan.

## 2021-09-05 NOTE — Patient Instructions (Signed)

## 2021-09-05 NOTE — Patient Instructions (Addendum)
[place]  The chemotherapy medication bag should finish at 46 hours, 96 hours, or 7 days. For example, if your pump is scheduled for 46 hours and it was put on at 4:00 p.m., it should finish at 2:00 p.m. the day it is scheduled to come off regardless of your appointment time.     Estimated time to finish at 12:30pm Friday, September 07, 2021.   If the display on your pump reads "Low Volume" and it is beeping, take the batteries out of the pump and come to the cancer center for it to be taken off.   If the pump alarms go off prior to the pump reading "Low Volume" then call [phone] and someone can assist you.  If the plunger comes out and the chemotherapy medication is leaking out, please use your home chemo spill kit to clean up the spill. Do NOT use paper towels or other household products.  If you have problems or questions regarding your pump, please call either [phone] (24 hours a day) or the cancer center Monday-Friday 8:00 a.m.- 4:30 p.m. at the clinic number and we will assist you. If you are unable to get assistance, then go to the nearest Emergency Department and ask the staff to contact the IV team for assistance.   Discharge Instructions: Thank you for choosing [facility] to provide your oncology and hematology care.   If you have a lab appointment with the [facility], please go directly to the [facility] and check in at the registration area.   Wear comfortable clothing and clothing appropriate for easy access to any Portacath or PICC line.   We strive to give you quality time with your provider. You may need to reschedule your appointment if you arrive late (15 or more minutes).  Arriving late affects you and other patients whose appointments are after yours.  Also, if you miss three or more appointments without notifying the office, you may be dismissed from the clinic at the provider's discretion.      For prescription  refill requests, have your pharmacy contact our office and allow 72 hours for refills to be completed.    Today you received the following chemotherapy and/or immunotherapy agents Oxaliplatin,Irinotecan, Leucovorin, Fluorouracil      To help prevent nausea and vomiting after your treatment, we encourage you to take your nausea medication as directed.  BELOW ARE SYMPTOMS THAT SHOULD BE REPORTED IMMEDIATELY: *FEVER GREATER THAN 100.4 F (38 C) OR HIGHER *CHILLS OR SWEATING *NAUSEA AND VOMITING THAT IS NOT CONTROLLED WITH YOUR NAUSEA MEDICATION *UNUSUAL SHORTNESS OF BREATH *UNUSUAL BRUISING OR BLEEDING *URINARY PROBLEMS (pain or burning when urinating, or frequent urination) *BOWEL PROBLEMS (unusual diarrhea, constipation, pain near the anus) TENDERNESS IN MOUTH AND THROAT WITH OR WITHOUT PRESENCE OF ULCERS (sore throat, sores in mouth, or a toothache) UNUSUAL RASH, SWELLING OR PAIN  UNUSUAL VAGINAL DISCHARGE OR ITCHING   Items with * indicate a potential emergency and should be followed up as soon as possible or go to the Emergency Department if any problems should occur.  Please show the CHEMOTHERAPY ALERT CARD or IMMUNOTHERAPY ALERT CARD at check-in to the Emergency Department and triage nurse.  Should you have questions after your visit or need to cancel or reschedule your appointment, please contact [facility]  Dept: [phone]  and follow the prompts.  Office hours are 8:00 a.m. to 4:30 p.m. Monday - Friday. Please note that voicemails  left after 4:00 p.m. may not be returned until the following business day.  We are closed weekends and major holidays. You have access to a nurse at all times for urgent questions. Please call the main number to the clinic Dept: [phone] and follow the prompts.   For any non-urgent questions, you may also contact your provider using MyChart. We now offer e-Visits for anyone 15 and older to request care online for  non-urgent symptoms. For details visit [email or web address].   Also download the MyChart app! Go to the app store, search "MyChart", open the app, select [facility], and log in with your MyChart username and password.  Masks are optional in the cancer centers. If you would like for your care team to wear a mask while they are taking care of you, please let them know. For doctor visits, patients may have with them one support person who is at least 54 years old. At this time, visitors are not allowed in the infusion area.  Oxaliplatin Injection What is this medication? OXALIPLATIN (ox AL i PLA tin) is a chemotherapy drug. It targets fast dividing cells, like cancer cells, and causes these cells to die. This medicine is used to treat cancers of the colon and rectum, and many other cancers. This medicine may be used for other purposes; ask your health care provider or pharmacist if you have questions. COMMON BRAND NAME(S): Eloxatin What should I tell my care team before I take this medication? They need to know if you have any of these conditions: heart disease history of irregular heartbeat liver disease low blood counts, like white cells, platelets, or red blood cells lung or breathing disease, like asthma take medicines that treat or prevent blood clots tingling of the fingers or toes, or other nerve disorder an unusual or allergic reaction to oxaliplatin, other chemotherapy, other medicines, foods, dyes, or preservatives pregnant or trying to get pregnant breast-feeding How should I use this medication? This drug is given as an infusion into a vein. It is administered in a hospital or clinic by a specially trained health care professional. Talk to your pediatrician regarding the use of this medicine in children. Special care may be needed. Overdosage: If you think you have taken too much of this medicine contact a poison control center or emergency room at once. NOTE: This medicine is  only for you. Do not share this medicine with others. What if I miss a dose? It is important not to miss a dose. Call your doctor or health care professional if you are unable to keep an appointment. What may interact with this medication? Do not take this medicine with any of the following medications: cisapride dronedarone pimozide thioridazine This medicine may also interact with the following medications: aspirin and aspirin-like medicines certain medicines that treat or prevent blood clots like warfarin, apixaban, dabigatran, and rivaroxaban cisplatin cyclosporine diuretics medicines for infection like acyclovir, adefovir, amphotericin B, bacitracin, cidofovir, foscarnet, ganciclovir, gentamicin, pentamidine, vancomycin NSAIDs, medicines for pain and inflammation, like ibuprofen or naproxen other medicines that prolong the QT interval (an abnormal heart rhythm) pamidronate zoledronic acid This list may not describe all possible interactions. Give your health care provider a list of all the medicines, herbs, non-prescription drugs, or dietary supplements you use. Also tell them if you smoke, drink alcohol, or use illegal drugs. Some items may interact with your medicine. What should I watch for while using this medication? Your condition will be monitored carefully while you are receiving this medicine.  You may need blood work done while you are taking this medicine. This medicine may make you feel generally unwell. This is not uncommon as chemotherapy can affect healthy cells as well as cancer cells. Report any side effects. Continue your course of treatment even though you feel ill unless your healthcare professional tells you to stop. This medicine can make you more sensitive to cold. Do not drink cold drinks or use ice. Cover exposed skin before coming in contact with cold temperatures or cold objects. When out in cold weather wear warm clothing and cover your mouth and nose to warm  the air that goes into your lungs. Tell your doctor if you get sensitive to the cold. Do not become pregnant while taking this medicine or for 9 months after stopping it. Women should inform their health care professional if they wish to become pregnant or think they might be pregnant. Men should not father a child while taking this medicine and for 6 months after stopping it. There is potential for serious side effects to an unborn child. Talk to your health care professional for more information. Do not breast-feed a child while taking this medicine or for 3 months after stopping it. This medicine has caused ovarian failure in some women. This medicine may make it more difficult to get pregnant. Talk to your health care professional if you are concerned about your fertility. This medicine has caused decreased sperm counts in some men. This may make it more difficult to father a child. Talk to your health care professional if you are concerned about your fertility. This medicine may increase your risk of getting an infection. Call your health care professional for advice if you get a fever, chills, or sore throat, or other symptoms of a cold or flu. Do not treat yourself. Try to avoid being around people who are sick. Avoid taking medicines that contain aspirin, acetaminophen, ibuprofen, naproxen, or ketoprofen unless instructed by your health care professional. These medicines may hide a fever. Be careful brushing or flossing your teeth or using a toothpick because you may get an infection or bleed more easily. If you have any dental work done, tell your dentist you are receiving this medicine. What side effects may I notice from receiving this medication? Side effects that you should report to your doctor or health care professional as soon as possible: allergic reactions like skin rash, itching or hives, swelling of the face, lips, or tongue breathing problems cough low blood counts - this medicine  may decrease the number of white blood cells, red blood cells, and platelets. You may be at increased risk for infections and bleeding nausea, vomiting pain, redness, or irritation at site where injected pain, tingling, numbness in the hands or feet signs and symptoms of bleeding such as bloody or black, tarry stools; red or dark brown urine; spitting up blood or brown material that looks like coffee grounds; red spots on the skin; unusual bruising or bleeding from the eyes, gums, or nose signs and symptoms of a dangerous change in heartbeat or heart rhythm like chest pain; dizziness; fast, irregular heartbeat; palpitations; feeling faint or lightheaded; falls signs and symptoms of infection like fever; chills; cough; sore throat; pain or trouble passing urine signs and symptoms of liver injury like dark yellow or brown urine; general ill feeling or flu-like symptoms; light-colored stools; loss of appetite; nausea; right upper belly pain; unusually weak or tired; yellowing of the eyes or skin signs and symptoms of low  red blood cells or anemia such as unusually weak or tired; feeling faint or lightheaded; falls signs and symptoms of muscle injury like dark urine; trouble passing urine or change in the amount of urine; unusually weak or tired; muscle pain; back pain Side effects that usually do not require medical attention (report to your doctor or health care professional if they continue or are bothersome): changes in taste diarrhea gas hair loss loss of appetite mouth sores This list may not describe all possible side effects. Call your doctor for medical advice about side effects. You may report side effects to FDA at 1-800-FDA-1088. Where should I keep my medication? This drug is given in a hospital or clinic and will not be stored at home. NOTE: This sheet is a summary. It may not cover all possible information. If you have questions about this medicine, talk to your doctor, pharmacist, or  health care provider.  2023 Elsevier/Gold Standard (2020-12-29 00:00:00)  Irinotecan injection What is this medication? IRINOTECAN (ir in oh TEE kan ) is a chemotherapy drug. It is used to treat colon and rectal cancer. This medicine may be used for other purposes; ask your health care provider or pharmacist if you have questions. COMMON BRAND NAME(S): Camptosar What should I tell my care team before I take this medication? They need to know if you have any of these conditions: dehydration diarrhea infection (especially a virus infection such as chickenpox, cold sores, or herpes) liver disease low blood counts, like low white cell, platelet, or red cell counts low levels of calcium, magnesium, or potassium in the blood recent or ongoing radiation therapy an unusual or allergic reaction to irinotecan, other medicines, foods, dyes, or preservatives pregnant or trying to get pregnant breast-feeding How should I use this medication? This drug is given as an infusion into a vein. It is administered in a hospital or clinic by a specially trained health care professional. Talk to your pediatrician regarding the use of this medicine in children. Special care may be needed. Overdosage: If you think you have taken too much of this medicine contact a poison control center or emergency room at once. NOTE: This medicine is only for you. Do not share this medicine with others. What if I miss a dose? It is important not to miss your dose. Call your doctor or health care professional if you are unable to keep an appointment. What may interact with this medication? Do not take this medicine with any of the following medications: cobicistat itraconazole This medicine may interact with the following medications: antiviral medicines for HIV or AIDS certain antibiotics like rifampin or rifabutin certain medicines for fungal infections like ketoconazole, posaconazole, and voriconazole certain medicines  for seizures like carbamazepine, phenobarbital, phenotoin clarithromycin gemfibrozil nefazodone St. John's Wort This list may not describe all possible interactions. Give your health care provider a list of all the medicines, herbs, non-prescription drugs, or dietary supplements you use. Also tell them if you smoke, drink alcohol, or use illegal drugs. Some items may interact with your medicine. What should I watch for while using this medication? Your condition will be monitored carefully while you are receiving this medicine. You will need important blood work done while you are taking this medicine. This drug may make you feel generally unwell. This is not uncommon, as chemotherapy can affect healthy cells as well as cancer cells. Report any side effects. Continue your course of treatment even though you feel ill unless your doctor tells you to stop.  In some cases, you may be given additional medicines to help with side effects. Follow all directions for their use. You may get drowsy or dizzy. Do not drive, use machinery, or do anything that needs mental alertness until you know how this medicine affects you. Do not stand or sit up quickly, especially if you are an older patient. This reduces the risk of dizzy or fainting spells. Call your health care professional for advice if you get a fever, chills, or sore throat, or other symptoms of a cold or flu. Do not treat yourself. This medicine decreases your body's ability to fight infections. Try to avoid being around people who are sick. Avoid taking products that contain aspirin, acetaminophen, ibuprofen, naproxen, or ketoprofen unless instructed by your doctor. These medicines may hide a fever. This medicine may increase your risk to bruise or bleed. Call your doctor or health care professional if you notice any unusual bleeding. Be careful brushing and flossing your teeth or using a toothpick because you may get an infection or bleed more easily.  If you have any dental work done, tell your dentist you are receiving this medicine. Do not become pregnant while taking this medicine or for 6 months after stopping it. Women should inform their health care professional if they wish to become pregnant or think they might be pregnant. Men should not father a child while taking this medicine and for 3 months after stopping it. There is potential for serious side effects to an unborn child. Talk to your health care professional for more information. Do not breast-feed an infant while taking this medicine or for 7 days after stopping it. This medicine has caused ovarian failure in some women. This medicine may make it more difficult to get pregnant. Talk to your health care professional if you are concerned about your fertility. This medicine has caused decreased sperm counts in some men. This may make it more difficult to father a child. Talk to your health care professional if you are concerned about your fertility. What side effects may I notice from receiving this medication? Side effects that you should report to your doctor or health care professional as soon as possible: allergic reactions like skin rash, itching or hives, swelling of the face, lips, or tongue chest pain diarrhea flushing, runny nose, sweating during infusion low blood counts - this medicine may decrease the number of white blood cells, red blood cells and platelets. You may be at increased risk for infections and bleeding. nausea, vomiting pain, swelling, warmth in the leg signs of decreased platelets or bleeding - bruising, pinpoint red spots on the skin, black, tarry stools, blood in the urine signs of infection - fever or chills, cough, sore throat, pain or difficulty passing urine signs of decreased red blood cells - unusually weak or tired, fainting spells, lightheadedness Side effects that usually do not require medical attention (report to your doctor or health care  professional if they continue or are bothersome): constipation hair loss headache loss of appetite mouth sores stomach pain This list may not describe all possible side effects. Call your doctor for medical advice about side effects. You may report side effects to FDA at 1-800-FDA-1088. Where should I keep my medication? This drug is given in a hospital or clinic and will not be stored at home. NOTE: This sheet is a summary. It may not cover all possible information. If you have questions about this medicine, talk to your doctor, pharmacist, or health care provider.  2023 Elsevier/Gold Standard (2020-12-29 00:00:00)  Leucovorin injection What is this medication? LEUCOVORIN (loo koe VOR in) is used to prevent or treat the harmful effects of some medicines. This medicine is used to treat anemia caused by a low amount of folic acid in the body. It is also used with 5-fluorouracil (5-FU) to treat colon cancer. This medicine may be used for other purposes; ask your health care provider or pharmacist if you have questions. What should I tell my care team before I take this medication? They need to know if you have any of these conditions: anemia from low levels of vitamin B-12 in the blood an unusual or allergic reaction to leucovorin, folic acid, other medicines, foods, dyes, or preservatives pregnant or trying to get pregnant breast-feeding How should I use this medication? This medicine is for injection into a muscle or into a vein. It is given by a health care professional in a hospital or clinic setting. Talk to your pediatrician regarding the use of this medicine in children. Special care may be needed. Overdosage: If you think you have taken too much of this medicine contact a poison control center or emergency room at once. NOTE: This medicine is only for you. Do not share this medicine with others. What if I miss a dose? This does not apply. What may interact with this  medication? capecitabine fluorouracil phenobarbital phenytoin primidone trimethoprim-sulfamethoxazole This list may not describe all possible interactions. Give your health care provider a list of all the medicines, herbs, non-prescription drugs, or dietary supplements you use. Also tell them if you smoke, drink alcohol, or use illegal drugs. Some items may interact with your medicine. What should I watch for while using this medication? Your condition will be monitored carefully while you are receiving this medicine. This medicine may increase the side effects of 5-fluorouracil, 5-FU. Tell your doctor or health care professional if you have diarrhea or mouth sores that do not get better or that get worse. What side effects may I notice from receiving this medication? Side effects that you should report to your doctor or health care professional as soon as possible: allergic reactions like skin rash, itching or hives, swelling of the face, lips, or tongue breathing problems fever, infection mouth sores unusual bleeding or bruising unusually weak or tired Side effects that usually do not require medical attention (report to your doctor or health care professional if they continue or are bothersome): constipation or diarrhea loss of appetite nausea, vomiting This list may not describe all possible side effects. Call your doctor for medical advice about side effects. You may report side effects to FDA at 1-800-FDA-1088. Where should I keep my medication? This drug is given in a hospital or clinic and will not be stored at home. NOTE: This sheet is a summary. It may not cover all possible information. If you have questions about this medicine, talk to your doctor, pharmacist, or health care provider.  2023 Elsevier/Gold Standard (2007-08-06 00:00:00)  Fluorouracil, 5-FU injection What is this medication? FLUOROURACIL, 5-FU (flure oh YOOR a sil) is a chemotherapy drug. It slows the growth  of cancer cells. This medicine is used to treat many types of cancer like breast cancer, colon or rectal cancer, pancreatic cancer, and stomach cancer. This medicine may be used for other purposes; ask your health care provider or pharmacist if you have questions. COMMON BRAND NAME(S): Adrucil What should I tell my care team before I take this medication? They need to know if  you have any of these conditions: blood disorders dihydropyrimidine dehydrogenase (DPD) deficiency infection (especially a virus infection such as chickenpox, cold sores, or herpes) kidney disease liver disease malnourished, poor nutrition recent or ongoing radiation therapy an unusual or allergic reaction to fluorouracil, other chemotherapy, other medicines, foods, dyes, or preservatives pregnant or trying to get pregnant breast-feeding How should I use this medication? This drug is given as an infusion or injection into a vein. It is administered in a hospital or clinic by a specially trained health care professional. Talk to your pediatrician regarding the use of this medicine in children. Special care may be needed. Overdosage: If you think you have taken too much of this medicine contact a poison control center or emergency room at once. NOTE: This medicine is only for you. Do not share this medicine with others. What if I miss a dose? It is important not to miss your dose. Call your doctor or health care professional if you are unable to keep an appointment. What may interact with this medication? Do not take this medicine with any of the following medications: live virus vaccines This medicine may also interact with the following medications: medicines that treat or prevent blood clots like warfarin, enoxaparin, and dalteparin This list may not describe all possible interactions. Give your health care provider a list of all the medicines, herbs, non-prescription drugs, or dietary supplements you use. Also tell  them if you smoke, drink alcohol, or use illegal drugs. Some items may interact with your medicine. What should I watch for while using this medication? Visit your doctor for checks on your progress. This drug may make you feel generally unwell. This is not uncommon, as chemotherapy can affect healthy cells as well as cancer cells. Report any side effects. Continue your course of treatment even though you feel ill unless your doctor tells you to stop. In some cases, you may be given additional medicines to help with side effects. Follow all directions for their use. Call your doctor or health care professional for advice if you get a fever, chills or sore throat, or other symptoms of a cold or flu. Do not treat yourself. This drug decreases your body's ability to fight infections. Try to avoid being around people who are sick. This medicine may increase your risk to bruise or bleed. Call your doctor or health care professional if you notice any unusual bleeding. Be careful brushing and flossing your teeth or using a toothpick because you may get an infection or bleed more easily. If you have any dental work done, tell your dentist you are receiving this medicine. Avoid taking products that contain aspirin, acetaminophen, ibuprofen, naproxen, or ketoprofen unless instructed by your doctor. These medicines may hide a fever. Do not become pregnant while taking this medicine. Women should inform their doctor if they wish to become pregnant or think they might be pregnant. There is a potential for serious side effects to an unborn child. Talk to your health care professional or pharmacist for more information. Do not breast-feed an infant while taking this medicine. Men should inform their doctor if they wish to father a child. This medicine may lower sperm counts. Do not treat diarrhea with over the counter products. Contact your doctor if you have diarrhea that lasts more than 2 days or if it is severe and  watery. This medicine can make you more sensitive to the sun. Keep out of the sun. If you cannot avoid being in the sun, wear  protective clothing and use sunscreen. Do not use sun lamps or tanning beds/booths. What side effects may I notice from receiving this medication? Side effects that you should report to your doctor or health care professional as soon as possible: allergic reactions like skin rash, itching or hives, swelling of the face, lips, or tongue low blood counts - this medicine may decrease the number of white blood cells, red blood cells and platelets. You may be at increased risk for infections and bleeding. signs of infection - fever or chills, cough, sore throat, pain or difficulty passing urine signs of decreased platelets or bleeding - bruising, pinpoint red spots on the skin, black, tarry stools, blood in the urine signs of decreased red blood cells - unusually weak or tired, fainting spells, lightheadedness breathing problems changes in vision chest pain mouth sores nausea and vomiting pain, swelling, redness at site where injected pain, tingling, numbness in the hands or feet redness, swelling, or sores on hands or feet stomach pain unusual bleeding Side effects that usually do not require medical attention (report to your doctor or health care professional if they continue or are bothersome): changes in finger or toe nails diarrhea dry or itchy skin hair loss headache loss of appetite sensitivity of eyes to the light stomach upset unusually teary eyes This list may not describe all possible side effects. Call your doctor for medical advice about side effects. You may report side effects to FDA at 1-800-FDA-1088. Where should I keep my medication? This drug is given in a hospital or clinic and will not be stored at home. NOTE: This sheet is a summary. It may not cover all possible information. If you have questions about this medicine, talk to your doctor,  pharmacist, or health care provider.  2023 Elsevier/Gold Standard (2020-12-29 00:00:00)

## 2021-09-05 NOTE — Progress Notes (Addendum)
[place] OFFICE PROGRESS NOTE   Diagnosis: Pancreas cancer  INTERVAL HISTORY:   Tara Jensen completed a cycle of FOLFIRINOX on 08/22/2021.  No mouth sores or diarrhea.  She had cold sensitivity following chemotherapy.  She had nausea, but no emesis.  Pain has improved.  She has a good appetite.  No neuropathy symptoms at present.  Voltaren gel helps the back pain. She was seen at [facility] 08/31/2021.  A CT there reveals evidence of liver metastases.  She understands she is not a surgical candidate.  Objective:  Vital signs in last 24 hours:  Blood pressure 116/77, pulse 72, temperature 98.1 F (36.7 C), temperature source Oral, resp. rate 18, height _0  (1.778 m), weight 107 lb 9.6 oz (48.8 kg), last menstrual period 11/01/2018, SpO2 100 %.    HEENT: No thrush or ulcers Resp: Lungs clear bilaterally Cardio: Regular rate and rhythm GI: No hepatosplenomegaly, nontender Vascular: No leg edema   Portacath/PICC-without erythema  Lab Results:  Lab Results  Component Value Date   WBC 10.4 09/05/2021   HGB 10.5 (L) 09/05/2021   HCT 32.6 (L) 09/05/2021   MCV 97.3 09/05/2021   PLT 119 (L) 09/05/2021   NEUTROABS 7.7 09/05/2021    CMP  Lab Results  Component Value Date   NA 140 09/05/2021   K 4.0 09/05/2021   CL 105 09/05/2021   CO2 26 09/05/2021   GLUCOSE 163 (H) 09/05/2021   BUN 22 (H) 09/05/2021   CREATININE 0.64 09/05/2021   CALCIUM 9.7 09/05/2021   PROT 6.8 09/05/2021   ALBUMIN 4.2 09/05/2021   AST 55 (H) 09/05/2021   ALT 97 (H) 09/05/2021   ALKPHOS 99 09/05/2021   BILITOT 0.3 09/05/2021   GFRNONAA >60 09/05/2021   GFRAA 76 09/08/2019    Lab Results  Component Value Date   TMA263 3,611 (H) 07/31/2021    Medications: I have reviewed the patient's current medications.   Assessment/Plan: Pancreas cancer, CT abdomen/pelvis 07/11/2021-irregular pancreas body mass with occlusion of the proximal splenic vein and SMV, SMV patent distally with small  filling defect likely due to thrombus, prominent subcentimeter left periotic lymph node.  Less than 180 degree abutment of the distal celiac, common hepatic, and splenic arteries, less than 180 degree abutment of the SMA CT chest 07/18/2021-no evidence of metastatic disease EUS 07/16/2021-extrinsic impression on the stomach at the posterior wall of the gastric body, no gross lesion in the duodenum, 45 x 36 mm pancreas body mass, abutment of the SMA, celiac trunk, and compression of the splenoportal confluence.  No malignant appearing lymph nodes, numerous venous collaterals adjacent to the portal vein, FNA biopsy-malignant cells consistent with adenocarcinoma, T4N0 by EUS Markedly elevated CA 19-9 Guardant360 07/31/2021-K-ras G12R, MSI high-not detected, ATM VUS Cycle 1 FOLFOX 08/08/2021 Cycle 2 FOLFIRINOX 08/22/2021 CT abdomen/pelvis at [facility] 08/31/2021-hypoattenuating liver lesions consistent with metastases, mass in the pancreas neck/body with greater than 100 degree encasement of the celiac axis and common hepatic artery.  Less than 180 degree abutment of the SMA, long segment occlusion of the portal splenic confluence, splenic vein occluded, multiple collateral vessels in the upper abdomen, prominent left periaortic node Cycle 3 FOLFIRINOX 09/05/2021 Pain secondary to #1-controlled with tramadol and Tylenol. Weight loss secondary to #1 History of situational depression Family history of breast cancer      Disposition: Tara Jensen has pancreas cancer.  She appears to have metastatic disease based on the CT at [facility] 08/31/2021.  We discussed the prognosis and treatment options  with her.  There was a significant gap between the baseline CT and initiation of chemotherapy.  I do not feel she has failed FOLFIRINOX.  Her appetite and pain have improved.  We will follow-up on the CA 19-9 from today.  She is tolerating the FOLFIRINOX well.  She will complete another cycle today.  Tara Jensen will return for an  office visit and the next cycle of FOLFIRINOX in 2 weeks.  We refilled her prescriptions for tramadol and Voltaren gel today.  Betsy Coder, MD  09/05/2021  1:32 PM

## 2021-09-06 ENCOUNTER — Other Ambulatory Visit: Payer: Self-pay | Admitting: *Deleted

## 2021-09-06 ENCOUNTER — Ambulatory Visit
Admission: RE | Admit: 2021-09-06 | Discharge: 2021-09-06 | Disposition: A | Discharge status: Home / Self Care | Payer: Self-pay | Source: Ambulatory Visit | Attending: Oncology | Admitting: Oncology

## 2021-09-06 DIAGNOSIS — C251 Malignant neoplasm of body of pancreas: Secondary | ICD-10-CM

## 2021-09-06 LAB — CANCER ANTIGEN 19-9: CA 19-9: 5070 U/mL — ABNORMAL HIGH (ref 0–35)

## 2021-09-06 NOTE — Progress Notes (Signed)
Call from [place] to report patient did not have PET there. Noted she had CT abd/pelvis using pancreas protocol on 08/31/21. Order placed to import this image and radiology notified.

## 2021-09-06 NOTE — Progress Notes (Signed)
PET images from [facility] 08/31/21 ordered for upload

## 2021-09-07 ENCOUNTER — Inpatient Hospital Stay: Payer: BC Managed Care – PPO

## 2021-09-07 VITALS — BP 107/66 | HR 68 | Temp 98.2°F | Resp 18

## 2021-09-07 DIAGNOSIS — C251 Malignant neoplasm of body of pancreas: Secondary | ICD-10-CM | POA: Diagnosis not present

## 2021-09-07 MED ORDER — PEGFILGRASTIM-CBQV 6 MG/0.6ML ~~LOC~~ SOSY
6.0000 mg | PREFILLED_SYRINGE | Freq: Once | SUBCUTANEOUS | Status: AC
  Administered 2021-09-07: 6 mg via SUBCUTANEOUS
  Filled 2021-09-07: qty 0.6

## 2021-09-07 MED ORDER — HEPARIN SOD (PORK) LOCK FLUSH 100 UNIT/ML IV SOLN
500.0000 [IU] | Freq: Once | INTRAVENOUS | Status: AC | PRN
  Administered 2021-09-07: 500 [IU]

## 2021-09-07 MED ORDER — SODIUM CHLORIDE 0.9% FLUSH
10.0000 mL | INTRAVENOUS | Status: DC | PRN
  Administered 2021-09-07: 10 mL

## 2021-09-07 MED ORDER — SODIUM CHLORIDE 0.9 % IV SOLN: INTRAVENOUS | Status: DC

## 2021-09-07 NOTE — Progress Notes (Signed)
[facility] CSW Progress Note  Clinical Education officer, museum contacted patient by phone to assess needs.  Had a brief conversation to offer support.  She expressed her thoughts and feelings.  CSW will continue to be available for assistance.    [name] Janiece Scovill, LCSW

## 2021-09-07 NOTE — Patient Instructions (Signed)

## 2021-09-12 ENCOUNTER — Other Ambulatory Visit: Payer: Self-pay

## 2021-09-16 ENCOUNTER — Other Ambulatory Visit: Payer: Self-pay | Admitting: Oncology

## 2021-09-17 ENCOUNTER — Ambulatory Visit: Payer: Self-pay | Admitting: Genetic Counselor

## 2021-09-17 ENCOUNTER — Other Ambulatory Visit: Payer: Self-pay

## 2021-09-19 ENCOUNTER — Encounter: Payer: Self-pay | Admitting: *Deleted

## 2021-09-19 ENCOUNTER — Inpatient Hospital Stay: Payer: BC Managed Care – PPO | Admitting: Nutrition

## 2021-09-19 ENCOUNTER — Inpatient Hospital Stay: Payer: BC Managed Care – PPO | Attending: Oncology

## 2021-09-19 ENCOUNTER — Inpatient Hospital Stay: Payer: BC Managed Care – PPO

## 2021-09-19 ENCOUNTER — Other Ambulatory Visit: Payer: Self-pay | Admitting: *Deleted

## 2021-09-19 ENCOUNTER — Inpatient Hospital Stay (HOSPITAL_BASED_OUTPATIENT_CLINIC_OR_DEPARTMENT_OTHER): Payer: BC Managed Care – PPO | Admitting: Oncology

## 2021-09-19 VITALS — BP 110/66 | HR 68

## 2021-09-19 DIAGNOSIS — Z803 Family history of malignant neoplasm of breast: Secondary | ICD-10-CM | POA: Insufficient documentation

## 2021-09-19 DIAGNOSIS — Z79899 Other long term (current) drug therapy: Secondary | ICD-10-CM | POA: Diagnosis not present

## 2021-09-19 DIAGNOSIS — C251 Malignant neoplasm of body of pancreas: Secondary | ICD-10-CM

## 2021-09-19 DIAGNOSIS — Z5111 Encounter for antineoplastic chemotherapy: Secondary | ICD-10-CM | POA: Insufficient documentation

## 2021-09-19 DIAGNOSIS — I81 Portal vein thrombosis: Secondary | ICD-10-CM | POA: Insufficient documentation

## 2021-09-19 DIAGNOSIS — I82890 Acute embolism and thrombosis of other specified veins: Secondary | ICD-10-CM | POA: Diagnosis not present

## 2021-09-19 LAB — CBC WITH DIFFERENTIAL (CANCER CENTER ONLY)
Abs Immature Granulocytes: 0.36 10*3/uL — ABNORMAL HIGH (ref 0.00–0.07)
Basophils Absolute: 0.1 10*3/uL (ref 0.0–0.1)
Basophils Relative: 1 %
Eosinophils Absolute: 0.3 10*3/uL (ref 0.0–0.5)
Eosinophils Relative: 3 %
HCT: 31.5 % — ABNORMAL LOW (ref 36.0–46.0)
Hemoglobin: 10.2 g/dL — ABNORMAL LOW (ref 12.0–15.0)
Immature Granulocytes: 3 %
Lymphocytes Relative: 12 %
Lymphs Abs: 1.7 10*3/uL (ref 0.7–4.0)
MCH: 31.7 pg (ref 26.0–34.0)
MCHC: 32.4 g/dL (ref 30.0–36.0)
MCV: 97.8 fL (ref 80.0–100.0)
Monocytes Absolute: 0.8 10*3/uL (ref 0.1–1.0)
Monocytes Relative: 6 %
Neutro Abs: 10.5 10*3/uL — ABNORMAL HIGH (ref 1.7–7.7)
Neutrophils Relative %: 75 %
Platelet Count: 161 10*3/uL (ref 150–400)
RBC: 3.22 MIL/uL — ABNORMAL LOW (ref 3.87–5.11)
RDW: 17.5 % — ABNORMAL HIGH (ref 11.5–15.5)
WBC Count: 13.8 10*3/uL — ABNORMAL HIGH (ref 4.0–10.5)
nRBC: 0 % (ref 0.0–0.2)

## 2021-09-19 LAB — CMP (CANCER CENTER ONLY)
ALT: 46 U/L — ABNORMAL HIGH (ref 0–44)
AST: 28 U/L (ref 15–41)
Albumin: 4.1 g/dL (ref 3.5–5.0)
Alkaline Phosphatase: 111 U/L (ref 38–126)
Anion gap: 8 (ref 5–15)
BUN: 22 mg/dL — ABNORMAL HIGH (ref 6–20)
CO2: 26 mmol/L (ref 22–32)
Calcium: 9 mg/dL (ref 8.9–10.3)
Chloride: 107 mmol/L (ref 98–111)
Creatinine: 0.59 mg/dL (ref 0.44–1.00)
GFR, Estimated: >60 mL/min (ref >60)
Glucose, Bld: 92 mg/dL (ref 70–99)
Potassium: 4.1 mmol/L (ref 3.5–5.1)
Sodium: 141 mmol/L (ref 135–145)
Total Bilirubin: 0.3 mg/dL (ref 0.3–1.2)
Total Protein: 6.7 g/dL (ref 6.5–8.1)

## 2021-09-19 MED ORDER — SODIUM CHLORIDE 0.9 % IV SOLN
150.0000 mg/m2 | Freq: Once | INTRAVENOUS | Status: AC
  Administered 2021-09-19: 240 mg via INTRAVENOUS
  Filled 2021-09-19: qty 4

## 2021-09-19 MED ORDER — ATROPINE SULFATE 1 MG/ML IV SOLN
0.5000 mg | Freq: Once | INTRAVENOUS | Status: AC | PRN
  Administered 2021-09-19: 0.5 mg via INTRAVENOUS

## 2021-09-19 MED ORDER — SODIUM CHLORIDE 0.9 % IV SOLN
10.0000 mg | Freq: Once | INTRAVENOUS | Status: AC
  Administered 2021-09-19: 10 mg via INTRAVENOUS
  Filled 2021-09-19: qty 1

## 2021-09-19 MED ORDER — TRAMADOL HCL 50 MG PO TABS: 50.0000 mg | ORAL_TABLET | Freq: Four times a day (QID) | ORAL | 0 refills | Status: DC | PRN

## 2021-09-19 MED ORDER — DEXTROSE 5 % IV SOLN: Freq: Once | INTRAVENOUS | Status: AC

## 2021-09-19 MED ORDER — PALONOSETRON HCL INJECTION 0.25 MG/5ML
0.2500 mg | Freq: Once | INTRAVENOUS | Status: AC
  Administered 2021-09-19: 0.25 mg via INTRAVENOUS

## 2021-09-19 MED ORDER — OXALIPLATIN CHEMO INJECTION 100 MG/20ML
85.0000 mg/m2 | Freq: Once | INTRAVENOUS | Status: AC
  Administered 2021-09-19: 130 mg via INTRAVENOUS
  Filled 2021-09-19: qty 20

## 2021-09-19 MED ORDER — SODIUM CHLORIDE 0.9 % IV SOLN
150.0000 mg | Freq: Once | INTRAVENOUS | Status: AC
  Administered 2021-09-19: 150 mg via INTRAVENOUS
  Filled 2021-09-19: qty 5

## 2021-09-19 MED ORDER — SODIUM CHLORIDE 0.9 % IV SOLN
2400.0000 mg/m2 | INTRAVENOUS | Status: DC
  Administered 2021-09-19: 3700 mg via INTRAVENOUS
  Filled 2021-09-19: qty 74

## 2021-09-19 MED ORDER — SODIUM CHLORIDE 0.9 % IV SOLN
400.0000 mg/m2 | Freq: Once | INTRAVENOUS | Status: AC
  Administered 2021-09-19: 620 mg via INTRAVENOUS
  Filled 2021-09-19: qty 25

## 2021-09-19 NOTE — Progress Notes (Signed)
[place] OFFICE PROGRESS NOTE   Diagnosis: Pancreas cancer  INTERVAL HISTORY:   Tara Jensen returns as scheduled.  She completed another cycle FOLFIRINOX on 09/05/2021.  She received G-CSF on 09/07/2021.  No nausea/vomiting, mouth sores, or diarrhea.  She reports a few episodes of cold sensitivity following chemotherapy.  No neuropathy symptoms at present.  Abdomen and back pain have improved significantly.  She has a good appetite.  Objective:  Vital signs in last 24 hours:  Blood pressure 116/72, pulse 60, temperature 98.1 F (36.7 C), temperature source Oral, resp. rate 20, height _0  (1.778 m), weight 109 lb 3.2 oz (49.5 kg), last menstrual period 11/01/2018, SpO2 98 %.    HEENT: No thrush or ulcers Resp: Lungs clear bilaterally Cardio: Regular rate and rhythm GI: No hepatosplenomegaly, nontender Vascular: No leg edema  Skin: Palms without erythema  Portacath/PICC-without erythema  Lab Results:  Lab Results  Component Value Date   WBC 13.8 (H) 09/19/2021   HGB 10.2 (L) 09/19/2021   HCT 31.5 (L) 09/19/2021   MCV 97.8 09/19/2021   PLT 161 09/19/2021   NEUTROABS 10.5 (H) 09/19/2021    CMP  Lab Results  Component Value Date   NA 141 09/19/2021   K 4.1 09/19/2021   CL 107 09/19/2021   CO2 26 09/19/2021   GLUCOSE 92 09/19/2021   BUN 22 (H) 09/19/2021   CREATININE 0.59 09/19/2021   CALCIUM 9.0 09/19/2021   PROT 6.7 09/19/2021   ALBUMIN 4.1 09/19/2021   AST 28 09/19/2021   ALT 46 (H) 09/19/2021   ALKPHOS 111 09/19/2021   BILITOT 0.3 09/19/2021   GFRNONAA >60 09/19/2021   GFRAA 76 09/08/2019    Lab Results  Component Value Date   SPQ330 5,070 (H) 09/05/2021    Medications: I have reviewed the patient's current medications.   Assessment/Plan: Pancreas cancer, CT abdomen/pelvis 07/11/2021-irregular pancreas body mass with occlusion of the proximal splenic vein and SMV, SMV patent distally with small filling defect likely due to  thrombus, prominent subcentimeter left periotic lymph node.  Less than 180 degree abutment of the distal celiac, common hepatic, and splenic arteries, less than 180 degree abutment of the SMA CT chest 07/18/2021-no evidence of metastatic disease EUS 07/16/2021-extrinsic impression on the stomach at the posterior wall of the gastric body, no gross lesion in the duodenum, 45 x 36 mm pancreas body mass, abutment of the SMA, celiac trunk, and compression of the splenoportal confluence.  No malignant appearing lymph nodes, numerous venous collaterals adjacent to the portal vein, FNA biopsy-malignant cells consistent with adenocarcinoma, T4N0 by EUS Markedly elevated CA 19-9 Guardant360 07/31/2021-K-ras G12R, MSI high-not detected, ATM VUS Cycle 1 FOLFOX 08/08/2021 Cycle 2 FOLFIRINOX 08/22/2021 CT abdomen/pelvis at [facility] 08/31/2021-hypoattenuating liver lesions consistent with metastases, mass in the pancreas neck/body with greater than 100 degree encasement of the celiac axis and common hepatic artery.  Less than 180 degree abutment of the SMA, long segment occlusion of the portal splenic confluence, splenic vein occluded, multiple collateral vessels in the upper abdomen, prominent left periaortic node Cycle 3 FOLFIRINOX 09/05/2021 Cycle 4 FOLFIRINOX 09/19/2021 Pain secondary to #1-controlled with tramadol and Tylenol. Weight loss secondary to #1 History of situational depression Family history of breast cancer       Disposition: [name] has completed 3 cycles FOLFIRINOX.  She has tolerated chemotherapy well.  Her performance status has improved.  She will complete cycle 4 FOLFIRINOX today.  She will return for an office visit and chemotherapy in  2 weeks.  The plan is to schedule restaging CTs after cycle 5.  We will follow-up on the CA 19-9 from today.  I refilled her prescription for tramadol.  Betsy Coder, MD  09/19/2021  10:29 AM

## 2021-09-19 NOTE — Patient Instructions (Signed)

## 2021-09-19 NOTE — Progress Notes (Signed)
Nutrition follow up completed with patient during infusion for Pancreas cancer. She is receiving FOLFIRINOX. Her weight improved to 109 pounds 3 oz from 107 pounds 10 oz on July 26. Reports appetite is good and she is eating well. She enjoys seafood and loves butter beans. She is also eating sweet potatoes. She currently denies nutrition impact symptoms.  Reports she really would like to try cooking a meal again because she used to love to cook.  Nutrition Diagnosis: Inadequate oral intake improving.  Intervention: Educated to continue small frequent meals and snacks. Encouraged hydration. Enjoy foods as tolerated.  Monitoring, Evaluation, goals: Tolerate calories and protein for minimizing wt loss.  Next Visit: To be scheduled with treatment as needed.

## 2021-09-19 NOTE — Progress Notes (Signed)
Patient seen by Dr. Sherrill today ? ?Vitals are within treatment parameters. ? ?Labs reviewed by Dr. Sherrill and are within treatment parameters. ? ?Per physician team, patient is ready for treatment and there are NO modifications to the treatment plan.  ?

## 2021-09-19 NOTE — Patient Instructions (Signed)
[place]  The chemotherapy medication bag should finish at 46 hours, 96 hours, or 7 days. For example, if your pump is scheduled for 46 hours and it was put on at 4:00 p.m., it should finish at 2:00 p.m. the day it is scheduled to come off regardless of your appointment time.     Estimated time to finish at 12:30pm Friday, September 07, 2021.   If the display on your pump reads "Low Volume" and it is beeping, take the batteries out of the pump and come to the cancer center for it to be taken off.   If the pump alarms go off prior to the pump reading "Low Volume" then call [phone] and someone can assist you.  If the plunger comes out and the chemotherapy medication is leaking out, please use your home chemo spill kit to clean up the spill. Do NOT use paper towels or other household products.  If you have problems or questions regarding your pump, please call either [phone] (24 hours a day) or the cancer center Monday-Friday 8:00 a.m.- 4:30 p.m. at the clinic number and we will assist you. If you are unable to get assistance, then go to the nearest Emergency Department and ask the staff to contact the IV team for assistance.   Discharge Instructions: Thank you for choosing [facility] to provide your oncology and hematology care.   If you have a lab appointment with the [facility], please go directly to the [facility] and check in at the registration area.   Wear comfortable clothing and clothing appropriate for easy access to any Portacath or PICC line.   We strive to give you quality time with your provider. You may need to reschedule your appointment if you arrive late (15 or more minutes).  Arriving late affects you and other patients whose appointments are after yours.  Also, if you miss three or more appointments without notifying the office, you may be dismissed from the clinic at the provider's discretion.      For prescription  refill requests, have your pharmacy contact our office and allow 72 hours for refills to be completed.    Today you received the following chemotherapy and/or immunotherapy agents Oxaliplatin,Irinotecan, Leucovorin, Fluorouracil      To help prevent nausea and vomiting after your treatment, we encourage you to take your nausea medication as directed.  BELOW ARE SYMPTOMS THAT SHOULD BE REPORTED IMMEDIATELY: *FEVER GREATER THAN 100.4 F (38 C) OR HIGHER *CHILLS OR SWEATING *NAUSEA AND VOMITING THAT IS NOT CONTROLLED WITH YOUR NAUSEA MEDICATION *UNUSUAL SHORTNESS OF BREATH *UNUSUAL BRUISING OR BLEEDING *URINARY PROBLEMS (pain or burning when urinating, or frequent urination) *BOWEL PROBLEMS (unusual diarrhea, constipation, pain near the anus) TENDERNESS IN MOUTH AND THROAT WITH OR WITHOUT PRESENCE OF ULCERS (sore throat, sores in mouth, or a toothache) UNUSUAL RASH, SWELLING OR PAIN  UNUSUAL VAGINAL DISCHARGE OR ITCHING   Items with * indicate a potential emergency and should be followed up as soon as possible or go to the Emergency Department if any problems should occur.  Please show the CHEMOTHERAPY ALERT CARD or IMMUNOTHERAPY ALERT CARD at check-in to the Emergency Department and triage nurse.  Should you have questions after your visit or need to cancel or reschedule your appointment, please contact [facility]  Dept: [phone]  and follow the prompts.  Office hours are 8:00 a.m. to 4:30 p.m. Monday - Friday. Please note that voicemails  left after 4:00 p.m. may not be returned until the following business day.  We are closed weekends and major holidays. You have access to a nurse at all times for urgent questions. Please call the main number to the clinic Dept: [phone] and follow the prompts.   For any non-urgent questions, you may also contact your provider using MyChart. We now offer e-Visits for anyone 15 and older to request care online for  non-urgent symptoms. For details visit [email or web address].   Also download the MyChart app! Go to the app store, search "MyChart", open the app, select [facility], and log in with your MyChart username and password.  Masks are optional in the cancer centers. If you would like for your care team to wear a mask while they are taking care of you, please let them know. For doctor visits, patients may have with them one support person who is at least 54 years old. At this time, visitors are not allowed in the infusion area.  Oxaliplatin Injection What is this medication? OXALIPLATIN (ox AL i PLA tin) is a chemotherapy drug. It targets fast dividing cells, like cancer cells, and causes these cells to die. This medicine is used to treat cancers of the colon and rectum, and many other cancers. This medicine may be used for other purposes; ask your health care provider or pharmacist if you have questions. COMMON BRAND NAME(S): Eloxatin What should I tell my care team before I take this medication? They need to know if you have any of these conditions: heart disease history of irregular heartbeat liver disease low blood counts, like white cells, platelets, or red blood cells lung or breathing disease, like asthma take medicines that treat or prevent blood clots tingling of the fingers or toes, or other nerve disorder an unusual or allergic reaction to oxaliplatin, other chemotherapy, other medicines, foods, dyes, or preservatives pregnant or trying to get pregnant breast-feeding How should I use this medication? This drug is given as an infusion into a vein. It is administered in a hospital or clinic by a specially trained health care professional. Talk to your pediatrician regarding the use of this medicine in children. Special care may be needed. Overdosage: If you think you have taken too much of this medicine contact a poison control center or emergency room at once. NOTE: This medicine is  only for you. Do not share this medicine with others. What if I miss a dose? It is important not to miss a dose. Call your doctor or health care professional if you are unable to keep an appointment. What may interact with this medication? Do not take this medicine with any of the following medications: cisapride dronedarone pimozide thioridazine This medicine may also interact with the following medications: aspirin and aspirin-like medicines certain medicines that treat or prevent blood clots like warfarin, apixaban, dabigatran, and rivaroxaban cisplatin cyclosporine diuretics medicines for infection like acyclovir, adefovir, amphotericin B, bacitracin, cidofovir, foscarnet, ganciclovir, gentamicin, pentamidine, vancomycin NSAIDs, medicines for pain and inflammation, like ibuprofen or naproxen other medicines that prolong the QT interval (an abnormal heart rhythm) pamidronate zoledronic acid This list may not describe all possible interactions. Give your health care provider a list of all the medicines, herbs, non-prescription drugs, or dietary supplements you use. Also tell them if you smoke, drink alcohol, or use illegal drugs. Some items may interact with your medicine. What should I watch for while using this medication? Your condition will be monitored carefully while you are receiving this medicine.  You may need blood work done while you are taking this medicine. This medicine may make you feel generally unwell. This is not uncommon as chemotherapy can affect healthy cells as well as cancer cells. Report any side effects. Continue your course of treatment even though you feel ill unless your healthcare professional tells you to stop. This medicine can make you more sensitive to cold. Do not drink cold drinks or use ice. Cover exposed skin before coming in contact with cold temperatures or cold objects. When out in cold weather wear warm clothing and cover your mouth and nose to warm  the air that goes into your lungs. Tell your doctor if you get sensitive to the cold. Do not become pregnant while taking this medicine or for 9 months after stopping it. Women should inform their health care professional if they wish to become pregnant or think they might be pregnant. Men should not father a child while taking this medicine and for 6 months after stopping it. There is potential for serious side effects to an unborn child. Talk to your health care professional for more information. Do not breast-feed a child while taking this medicine or for 3 months after stopping it. This medicine has caused ovarian failure in some women. This medicine may make it more difficult to get pregnant. Talk to your health care professional if you are concerned about your fertility. This medicine has caused decreased sperm counts in some men. This may make it more difficult to father a child. Talk to your health care professional if you are concerned about your fertility. This medicine may increase your risk of getting an infection. Call your health care professional for advice if you get a fever, chills, or sore throat, or other symptoms of a cold or flu. Do not treat yourself. Try to avoid being around people who are sick. Avoid taking medicines that contain aspirin, acetaminophen, ibuprofen, naproxen, or ketoprofen unless instructed by your health care professional. These medicines may hide a fever. Be careful brushing or flossing your teeth or using a toothpick because you may get an infection or bleed more easily. If you have any dental work done, tell your dentist you are receiving this medicine. What side effects may I notice from receiving this medication? Side effects that you should report to your doctor or health care professional as soon as possible: allergic reactions like skin rash, itching or hives, swelling of the face, lips, or tongue breathing problems cough low blood counts - this medicine  may decrease the number of white blood cells, red blood cells, and platelets. You may be at increased risk for infections and bleeding nausea, vomiting pain, redness, or irritation at site where injected pain, tingling, numbness in the hands or feet signs and symptoms of bleeding such as bloody or black, tarry stools; red or dark brown urine; spitting up blood or brown material that looks like coffee grounds; red spots on the skin; unusual bruising or bleeding from the eyes, gums, or nose signs and symptoms of a dangerous change in heartbeat or heart rhythm like chest pain; dizziness; fast, irregular heartbeat; palpitations; feeling faint or lightheaded; falls signs and symptoms of infection like fever; chills; cough; sore throat; pain or trouble passing urine signs and symptoms of liver injury like dark yellow or brown urine; general ill feeling or flu-like symptoms; light-colored stools; loss of appetite; nausea; right upper belly pain; unusually weak or tired; yellowing of the eyes or skin signs and symptoms of low  red blood cells or anemia such as unusually weak or tired; feeling faint or lightheaded; falls signs and symptoms of muscle injury like dark urine; trouble passing urine or change in the amount of urine; unusually weak or tired; muscle pain; back pain Side effects that usually do not require medical attention (report to your doctor or health care professional if they continue or are bothersome): changes in taste diarrhea gas hair loss loss of appetite mouth sores This list may not describe all possible side effects. Call your doctor for medical advice about side effects. You may report side effects to FDA at 1-800-FDA-1088. Where should I keep my medication? This drug is given in a hospital or clinic and will not be stored at home. NOTE: This sheet is a summary. It may not cover all possible information. If you have questions about this medicine, talk to your doctor, pharmacist, or  health care provider.  2023 Elsevier/Gold Standard (2020-12-29 00:00:00)  Irinotecan injection What is this medication? IRINOTECAN (ir in oh TEE kan ) is a chemotherapy drug. It is used to treat colon and rectal cancer. This medicine may be used for other purposes; ask your health care provider or pharmacist if you have questions. COMMON BRAND NAME(S): Camptosar What should I tell my care team before I take this medication? They need to know if you have any of these conditions: dehydration diarrhea infection (especially a virus infection such as chickenpox, cold sores, or herpes) liver disease low blood counts, like low white cell, platelet, or red cell counts low levels of calcium, magnesium, or potassium in the blood recent or ongoing radiation therapy an unusual or allergic reaction to irinotecan, other medicines, foods, dyes, or preservatives pregnant or trying to get pregnant breast-feeding How should I use this medication? This drug is given as an infusion into a vein. It is administered in a hospital or clinic by a specially trained health care professional. Talk to your pediatrician regarding the use of this medicine in children. Special care may be needed. Overdosage: If you think you have taken too much of this medicine contact a poison control center or emergency room at once. NOTE: This medicine is only for you. Do not share this medicine with others. What if I miss a dose? It is important not to miss your dose. Call your doctor or health care professional if you are unable to keep an appointment. What may interact with this medication? Do not take this medicine with any of the following medications: cobicistat itraconazole This medicine may interact with the following medications: antiviral medicines for HIV or AIDS certain antibiotics like rifampin or rifabutin certain medicines for fungal infections like ketoconazole, posaconazole, and voriconazole certain medicines  for seizures like carbamazepine, phenobarbital, phenotoin clarithromycin gemfibrozil nefazodone St. John's Wort This list may not describe all possible interactions. Give your health care provider a list of all the medicines, herbs, non-prescription drugs, or dietary supplements you use. Also tell them if you smoke, drink alcohol, or use illegal drugs. Some items may interact with your medicine. What should I watch for while using this medication? Your condition will be monitored carefully while you are receiving this medicine. You will need important blood work done while you are taking this medicine. This drug may make you feel generally unwell. This is not uncommon, as chemotherapy can affect healthy cells as well as cancer cells. Report any side effects. Continue your course of treatment even though you feel ill unless your doctor tells you to stop.  In some cases, you may be given additional medicines to help with side effects. Follow all directions for their use. You may get drowsy or dizzy. Do not drive, use machinery, or do anything that needs mental alertness until you know how this medicine affects you. Do not stand or sit up quickly, especially if you are an older patient. This reduces the risk of dizzy or fainting spells. Call your health care professional for advice if you get a fever, chills, or sore throat, or other symptoms of a cold or flu. Do not treat yourself. This medicine decreases your body's ability to fight infections. Try to avoid being around people who are sick. Avoid taking products that contain aspirin, acetaminophen, ibuprofen, naproxen, or ketoprofen unless instructed by your doctor. These medicines may hide a fever. This medicine may increase your risk to bruise or bleed. Call your doctor or health care professional if you notice any unusual bleeding. Be careful brushing and flossing your teeth or using a toothpick because you may get an infection or bleed more easily.  If you have any dental work done, tell your dentist you are receiving this medicine. Do not become pregnant while taking this medicine or for 6 months after stopping it. Women should inform their health care professional if they wish to become pregnant or think they might be pregnant. Men should not father a child while taking this medicine and for 3 months after stopping it. There is potential for serious side effects to an unborn child. Talk to your health care professional for more information. Do not breast-feed an infant while taking this medicine or for 7 days after stopping it. This medicine has caused ovarian failure in some women. This medicine may make it more difficult to get pregnant. Talk to your health care professional if you are concerned about your fertility. This medicine has caused decreased sperm counts in some men. This may make it more difficult to father a child. Talk to your health care professional if you are concerned about your fertility. What side effects may I notice from receiving this medication? Side effects that you should report to your doctor or health care professional as soon as possible: allergic reactions like skin rash, itching or hives, swelling of the face, lips, or tongue chest pain diarrhea flushing, runny nose, sweating during infusion low blood counts - this medicine may decrease the number of white blood cells, red blood cells and platelets. You may be at increased risk for infections and bleeding. nausea, vomiting pain, swelling, warmth in the leg signs of decreased platelets or bleeding - bruising, pinpoint red spots on the skin, black, tarry stools, blood in the urine signs of infection - fever or chills, cough, sore throat, pain or difficulty passing urine signs of decreased red blood cells - unusually weak or tired, fainting spells, lightheadedness Side effects that usually do not require medical attention (report to your doctor or health care  professional if they continue or are bothersome): constipation hair loss headache loss of appetite mouth sores stomach pain This list may not describe all possible side effects. Call your doctor for medical advice about side effects. You may report side effects to FDA at 1-800-FDA-1088. Where should I keep my medication? This drug is given in a hospital or clinic and will not be stored at home. NOTE: This sheet is a summary. It may not cover all possible information. If you have questions about this medicine, talk to your doctor, pharmacist, or health care provider.  2023 Elsevier/Gold Standard (2020-12-29 00:00:00)  Leucovorin injection What is this medication? LEUCOVORIN (loo koe VOR in) is used to prevent or treat the harmful effects of some medicines. This medicine is used to treat anemia caused by a low amount of folic acid in the body. It is also used with 5-fluorouracil (5-FU) to treat colon cancer. This medicine may be used for other purposes; ask your health care provider or pharmacist if you have questions. What should I tell my care team before I take this medication? They need to know if you have any of these conditions: anemia from low levels of vitamin B-12 in the blood an unusual or allergic reaction to leucovorin, folic acid, other medicines, foods, dyes, or preservatives pregnant or trying to get pregnant breast-feeding How should I use this medication? This medicine is for injection into a muscle or into a vein. It is given by a health care professional in a hospital or clinic setting. Talk to your pediatrician regarding the use of this medicine in children. Special care may be needed. Overdosage: If you think you have taken too much of this medicine contact a poison control center or emergency room at once. NOTE: This medicine is only for you. Do not share this medicine with others. What if I miss a dose? This does not apply. What may interact with this  medication? capecitabine fluorouracil phenobarbital phenytoin primidone trimethoprim-sulfamethoxazole This list may not describe all possible interactions. Give your health care provider a list of all the medicines, herbs, non-prescription drugs, or dietary supplements you use. Also tell them if you smoke, drink alcohol, or use illegal drugs. Some items may interact with your medicine. What should I watch for while using this medication? Your condition will be monitored carefully while you are receiving this medicine. This medicine may increase the side effects of 5-fluorouracil, 5-FU. Tell your doctor or health care professional if you have diarrhea or mouth sores that do not get better or that get worse. What side effects may I notice from receiving this medication? Side effects that you should report to your doctor or health care professional as soon as possible: allergic reactions like skin rash, itching or hives, swelling of the face, lips, or tongue breathing problems fever, infection mouth sores unusual bleeding or bruising unusually weak or tired Side effects that usually do not require medical attention (report to your doctor or health care professional if they continue or are bothersome): constipation or diarrhea loss of appetite nausea, vomiting This list may not describe all possible side effects. Call your doctor for medical advice about side effects. You may report side effects to FDA at 1-800-FDA-1088. Where should I keep my medication? This drug is given in a hospital or clinic and will not be stored at home. NOTE: This sheet is a summary. It may not cover all possible information. If you have questions about this medicine, talk to your doctor, pharmacist, or health care provider.  2023 Elsevier/Gold Standard (2007-08-06 00:00:00)  Fluorouracil, 5-FU injection What is this medication? FLUOROURACIL, 5-FU (flure oh YOOR a sil) is a chemotherapy drug. It slows the growth  of cancer cells. This medicine is used to treat many types of cancer like breast cancer, colon or rectal cancer, pancreatic cancer, and stomach cancer. This medicine may be used for other purposes; ask your health care provider or pharmacist if you have questions. COMMON BRAND NAME(S): Adrucil What should I tell my care team before I take this medication? They need to know if  you have any of these conditions: blood disorders dihydropyrimidine dehydrogenase (DPD) deficiency infection (especially a virus infection such as chickenpox, cold sores, or herpes) kidney disease liver disease malnourished, poor nutrition recent or ongoing radiation therapy an unusual or allergic reaction to fluorouracil, other chemotherapy, other medicines, foods, dyes, or preservatives pregnant or trying to get pregnant breast-feeding How should I use this medication? This drug is given as an infusion or injection into a vein. It is administered in a hospital or clinic by a specially trained health care professional. Talk to your pediatrician regarding the use of this medicine in children. Special care may be needed. Overdosage: If you think you have taken too much of this medicine contact a poison control center or emergency room at once. NOTE: This medicine is only for you. Do not share this medicine with others. What if I miss a dose? It is important not to miss your dose. Call your doctor or health care professional if you are unable to keep an appointment. What may interact with this medication? Do not take this medicine with any of the following medications: live virus vaccines This medicine may also interact with the following medications: medicines that treat or prevent blood clots like warfarin, enoxaparin, and dalteparin This list may not describe all possible interactions. Give your health care provider a list of all the medicines, herbs, non-prescription drugs, or dietary supplements you use. Also tell  them if you smoke, drink alcohol, or use illegal drugs. Some items may interact with your medicine. What should I watch for while using this medication? Visit your doctor for checks on your progress. This drug may make you feel generally unwell. This is not uncommon, as chemotherapy can affect healthy cells as well as cancer cells. Report any side effects. Continue your course of treatment even though you feel ill unless your doctor tells you to stop. In some cases, you may be given additional medicines to help with side effects. Follow all directions for their use. Call your doctor or health care professional for advice if you get a fever, chills or sore throat, or other symptoms of a cold or flu. Do not treat yourself. This drug decreases your body's ability to fight infections. Try to avoid being around people who are sick. This medicine may increase your risk to bruise or bleed. Call your doctor or health care professional if you notice any unusual bleeding. Be careful brushing and flossing your teeth or using a toothpick because you may get an infection or bleed more easily. If you have any dental work done, tell your dentist you are receiving this medicine. Avoid taking products that contain aspirin, acetaminophen, ibuprofen, naproxen, or ketoprofen unless instructed by your doctor. These medicines may hide a fever. Do not become pregnant while taking this medicine. Women should inform their doctor if they wish to become pregnant or think they might be pregnant. There is a potential for serious side effects to an unborn child. Talk to your health care professional or pharmacist for more information. Do not breast-feed an infant while taking this medicine. Men should inform their doctor if they wish to father a child. This medicine may lower sperm counts. Do not treat diarrhea with over the counter products. Contact your doctor if you have diarrhea that lasts more than 2 days or if it is severe and  watery. This medicine can make you more sensitive to the sun. Keep out of the sun. If you cannot avoid being in the sun, wear  protective clothing and use sunscreen. Do not use sun lamps or tanning beds/booths. What side effects may I notice from receiving this medication? Side effects that you should report to your doctor or health care professional as soon as possible: allergic reactions like skin rash, itching or hives, swelling of the face, lips, or tongue low blood counts - this medicine may decrease the number of white blood cells, red blood cells and platelets. You may be at increased risk for infections and bleeding. signs of infection - fever or chills, cough, sore throat, pain or difficulty passing urine signs of decreased platelets or bleeding - bruising, pinpoint red spots on the skin, black, tarry stools, blood in the urine signs of decreased red blood cells - unusually weak or tired, fainting spells, lightheadedness breathing problems changes in vision chest pain mouth sores nausea and vomiting pain, swelling, redness at site where injected pain, tingling, numbness in the hands or feet redness, swelling, or sores on hands or feet stomach pain unusual bleeding Side effects that usually do not require medical attention (report to your doctor or health care professional if they continue or are bothersome): changes in finger or toe nails diarrhea dry or itchy skin hair loss headache loss of appetite sensitivity of eyes to the light stomach upset unusually teary eyes This list may not describe all possible side effects. Call your doctor for medical advice about side effects. You may report side effects to FDA at 1-800-FDA-1088. Where should I keep my medication? This drug is given in a hospital or clinic and will not be stored at home. NOTE: This sheet is a summary. It may not cover all possible information. If you have questions about this medicine, talk to your doctor,  pharmacist, or health care provider.  2023 [employer] (2020-12-29 00:00:00)

## 2021-09-20 ENCOUNTER — Other Ambulatory Visit: Payer: Self-pay

## 2021-09-21 ENCOUNTER — Inpatient Hospital Stay: Payer: BC Managed Care – PPO

## 2021-09-21 ENCOUNTER — Other Ambulatory Visit: Payer: Self-pay | Admitting: Nurse Practitioner

## 2021-09-21 VITALS — BP 103/67 | HR 73 | Temp 97.9°F | Resp 18

## 2021-09-21 DIAGNOSIS — C251 Malignant neoplasm of body of pancreas: Secondary | ICD-10-CM

## 2021-09-21 LAB — CANCER ANTIGEN 19-9: CA 19-9: 4038 U/mL — ABNORMAL HIGH (ref 0–35)

## 2021-09-21 MED ORDER — DEXAMETHASONE 4 MG PO TABS: 4.0000 mg | ORAL_TABLET | Freq: Two times a day (BID) | ORAL | 0 refills | Status: DC

## 2021-09-21 MED ORDER — SODIUM CHLORIDE 0.9% FLUSH
10.0000 mL | INTRAVENOUS | Status: DC | PRN
  Administered 2021-09-21: 10 mL

## 2021-09-21 MED ORDER — SODIUM CHLORIDE 0.9 % IV SOLN: INTRAVENOUS | Status: AC

## 2021-09-21 MED ORDER — PEGFILGRASTIM-CBQV 6 MG/0.6ML ~~LOC~~ SOSY
6.0000 mg | PREFILLED_SYRINGE | Freq: Once | SUBCUTANEOUS | Status: AC
  Administered 2021-09-21: 6 mg via SUBCUTANEOUS

## 2021-09-21 MED ORDER — HEPARIN SOD (PORK) LOCK FLUSH 100 UNIT/ML IV SOLN
500.0000 [IU] | Freq: Once | INTRAVENOUS | Status: AC | PRN
  Administered 2021-09-21: 500 [IU]

## 2021-09-21 MED ORDER — PROCHLORPERAZINE MALEATE 5 MG PO TABS: 5.0000 mg | ORAL_TABLET | Freq: Four times a day (QID) | ORAL | 2 refills | Status: DC | PRN

## 2021-09-21 NOTE — Patient Instructions (Signed)

## 2021-09-29 ENCOUNTER — Other Ambulatory Visit: Payer: Self-pay | Admitting: Oncology

## 2021-09-29 DIAGNOSIS — C251 Malignant neoplasm of body of pancreas: Secondary | ICD-10-CM

## 2021-09-29 NOTE — Progress Notes (Signed)
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  Original Decision Date/Time: 07/24/2021 14:22     A cycle is every 14 days:     Oxaliplatin      Leucovorin      Irinotecan      Fluorouracil   **Always confirm dose/schedule in your pharmacy ordering system**  Patient Characteristics: Locally Advanced, Anatomically Unresectable, M0, First Line, PS = 0,1, BRCA1/2 and PALB2 Mutation Absent/Unknown, Chemotherapy Therapeutic Status: Locally Advanced, Anatomically Unresectable, M0 Line of Therapy: First Line ECOG Performance Status: 1 BRCA1/2 Mutation Status: Awaiting Test Results PALB2 Mutation Status: Awaiting Test Results Intent of Therapy: Non-Curative / Palliative Intent, Discussed with Patient

## 2021-10-03 ENCOUNTER — Inpatient Hospital Stay: Payer: BC Managed Care – PPO

## 2021-10-03 ENCOUNTER — Inpatient Hospital Stay (HOSPITAL_BASED_OUTPATIENT_CLINIC_OR_DEPARTMENT_OTHER): Payer: BC Managed Care – PPO | Admitting: Nurse Practitioner

## 2021-10-03 ENCOUNTER — Encounter: Payer: Self-pay | Admitting: Nurse Practitioner

## 2021-10-03 ENCOUNTER — Ambulatory Visit: Payer: BC Managed Care – PPO

## 2021-10-03 ENCOUNTER — Other Ambulatory Visit: Payer: BC Managed Care – PPO

## 2021-10-03 ENCOUNTER — Ambulatory Visit: Payer: BC Managed Care – PPO | Admitting: Oncology

## 2021-10-03 ENCOUNTER — Other Ambulatory Visit: Payer: Self-pay | Admitting: *Deleted

## 2021-10-03 ENCOUNTER — Encounter: Payer: Self-pay | Admitting: *Deleted

## 2021-10-03 VITALS — BP 107/68 | HR 80 | Temp 98.2°F | Resp 18 | Ht 70.0 in | Wt 109.2 lb

## 2021-10-03 VITALS — BP 109/71 | HR 70

## 2021-10-03 DIAGNOSIS — C251 Malignant neoplasm of body of pancreas: Secondary | ICD-10-CM

## 2021-10-03 LAB — CBC WITH DIFFERENTIAL (CANCER CENTER ONLY)
Abs Immature Granulocytes: 0.17 10*3/uL — ABNORMAL HIGH (ref 0.00–0.07)
Basophils Absolute: 0.1 10*3/uL (ref 0.0–0.1)
Basophils Relative: 1 %
Eosinophils Absolute: 0.3 10*3/uL (ref 0.0–0.5)
Eosinophils Relative: 3 %
HCT: 31.1 % — ABNORMAL LOW (ref 36.0–46.0)
Hemoglobin: 10.1 g/dL — ABNORMAL LOW (ref 12.0–15.0)
Immature Granulocytes: 2 %
Lymphocytes Relative: 14 %
Lymphs Abs: 1.4 10*3/uL (ref 0.7–4.0)
MCH: 32.2 pg (ref 26.0–34.0)
MCHC: 32.5 g/dL (ref 30.0–36.0)
MCV: 99 fL (ref 80.0–100.0)
Monocytes Absolute: 0.5 10*3/uL (ref 0.1–1.0)
Monocytes Relative: 6 %
Neutro Abs: 7.2 10*3/uL (ref 1.7–7.7)
Neutrophils Relative %: 74 %
Platelet Count: 142 10*3/uL — ABNORMAL LOW (ref 150–400)
RBC: 3.14 MIL/uL — ABNORMAL LOW (ref 3.87–5.11)
RDW: 18.8 % — ABNORMAL HIGH (ref 11.5–15.5)
WBC Count: 9.5 10*3/uL (ref 4.0–10.5)
nRBC: 0 % (ref 0.0–0.2)

## 2021-10-03 LAB — CMP (CANCER CENTER ONLY)
ALT: 24 U/L (ref 0–44)
AST: 21 U/L (ref 15–41)
Albumin: 3.9 g/dL (ref 3.5–5.0)
Alkaline Phosphatase: 103 U/L (ref 38–126)
Anion gap: 12 (ref 5–15)
BUN: 18 mg/dL (ref 6–20)
CO2: 24 mmol/L (ref 22–32)
Calcium: 8.8 mg/dL — ABNORMAL LOW (ref 8.9–10.3)
Chloride: 108 mmol/L (ref 98–111)
Creatinine: 0.64 mg/dL (ref 0.44–1.00)
GFR, Estimated: >60 mL/min (ref >60)
Glucose, Bld: 144 mg/dL — ABNORMAL HIGH (ref 70–99)
Potassium: 3.6 mmol/L (ref 3.5–5.1)
Sodium: 144 mmol/L (ref 135–145)
Total Bilirubin: 0.3 mg/dL (ref 0.3–1.2)
Total Protein: 6.8 g/dL (ref 6.5–8.1)

## 2021-10-03 MED ORDER — OXALIPLATIN CHEMO INJECTION 100 MG/20ML
85.0000 mg/m2 | Freq: Once | INTRAVENOUS | Status: AC
  Administered 2021-10-03: 135 mg via INTRAVENOUS
  Filled 2021-10-03: qty 10

## 2021-10-03 MED ORDER — SODIUM CHLORIDE 0.9 % IV SOLN
2400.0000 mg/m2 | INTRAVENOUS | Status: DC
  Administered 2021-10-03: 3750 mg via INTRAVENOUS
  Filled 2021-10-03: qty 75

## 2021-10-03 MED ORDER — PALONOSETRON HCL INJECTION 0.25 MG/5ML
0.2500 mg | Freq: Once | INTRAVENOUS | Status: AC
  Administered 2021-10-03: 0.25 mg via INTRAVENOUS
  Filled 2021-10-03: qty 5

## 2021-10-03 MED ORDER — SODIUM CHLORIDE 0.9 % IV SOLN
10.0000 mg | Freq: Once | INTRAVENOUS | Status: AC
  Administered 2021-10-03: 10 mg via INTRAVENOUS
  Filled 2021-10-03: qty 10

## 2021-10-03 MED ORDER — SODIUM CHLORIDE 0.9 % IV SOLN
400.0000 mg/m2 | Freq: Once | INTRAVENOUS | Status: AC
  Administered 2021-10-03: 624 mg via INTRAVENOUS
  Filled 2021-10-03: qty 31.2

## 2021-10-03 MED ORDER — TRAMADOL HCL 50 MG PO TABS: 50.0000 mg | ORAL_TABLET | Freq: Four times a day (QID) | ORAL | 0 refills | Status: DC | PRN

## 2021-10-03 MED ORDER — DEXTROSE 5 % IV SOLN: Freq: Once | INTRAVENOUS | Status: AC

## 2021-10-03 MED ORDER — SODIUM CHLORIDE 0.9 % IV SOLN
150.0000 mg/m2 | Freq: Once | INTRAVENOUS | Status: AC
  Administered 2021-10-03: 240 mg via INTRAVENOUS
  Filled 2021-10-03: qty 10

## 2021-10-03 MED ORDER — SODIUM CHLORIDE 0.9% FLUSH
10.0000 mL | INTRAVENOUS | Status: DC | PRN
  Administered 2021-10-03: 10 mL

## 2021-10-03 MED ORDER — SODIUM CHLORIDE 0.9 % IV SOLN
150.0000 mg | Freq: Once | INTRAVENOUS | Status: AC
  Administered 2021-10-03: 150 mg via INTRAVENOUS
  Filled 2021-10-03: qty 5

## 2021-10-03 MED ORDER — ATROPINE SULFATE 1 MG/ML IV SOLN
0.5000 mg | Freq: Once | INTRAVENOUS | Status: AC | PRN
  Administered 2021-10-03: 0.5 mg via INTRAVENOUS
  Filled 2021-10-03: qty 1

## 2021-10-03 NOTE — Patient Instructions (Addendum)
[facility]  The chemotherapy medication bag should finish at 46 hours, 96 hours, or 7 days. For example, if your pump is scheduled for 46 hours and it was put on at 4:00 p.m., it should finish at 2:00 p.m. the day it is scheduled to come off regardless of your appointment time.     Estimated time to finish at 11:30am Friday, October 05, 2021.   If the display on your pump reads "Low Volume" and it is beeping, take the batteries out of the pump and come to the cancer center for it to be taken off.   If the pump alarms go off prior to the pump reading "Low Volume" then call [phone] and someone can assist you.  If the plunger comes out and the chemotherapy medication is leaking out, please use your home chemo spill kit to clean up the spill. Do NOT use paper towels or other household products.  If you have problems or questions regarding your pump, please call either [phone] (24 hours a day) or the cancer center Monday-Friday 8:00 a.m.- 4:30 p.m. at the clinic number and we will assist you. If you are unable to get assistance, then go to the nearest Emergency Department and ask the staff to contact the IV team for assistance.   Discharge Instructions: Thank you for choosing [facility] to provide your oncology and hematology care.   If you have a lab appointment with the [facility], please go directly to the [facility] and check in at the registration area.   Wear comfortable clothing and clothing appropriate for easy access to any Portacath or PICC line.   We strive to give you quality time with your provider. You may need to reschedule your appointment if you arrive late (15 or more minutes).  Arriving late affects you and other patients whose appointments are after yours.  Also, if you miss three or more appointments without notifying the office, you may be dismissed from the clinic at the provider's discretion.      For prescription  refill requests, have your pharmacy contact our office and allow 72 hours for refills to be completed.    Today you received the following chemotherapy and/or immunotherapy agents Oxaliplatin,Irinotecan, Leucovorin, Fluorouracil      To help prevent nausea and vomiting after your treatment, we encourage you to take your nausea medication as directed.  BELOW ARE SYMPTOMS THAT SHOULD BE REPORTED IMMEDIATELY: *FEVER GREATER THAN 100.4 F (38 C) OR HIGHER *CHILLS OR SWEATING *NAUSEA AND VOMITING THAT IS NOT CONTROLLED WITH YOUR NAUSEA MEDICATION *UNUSUAL SHORTNESS OF BREATH *UNUSUAL BRUISING OR BLEEDING *URINARY PROBLEMS (pain or burning when urinating, or frequent urination) *BOWEL PROBLEMS (unusual diarrhea, constipation, pain near the anus) TENDERNESS IN MOUTH AND THROAT WITH OR WITHOUT PRESENCE OF ULCERS (sore throat, sores in mouth, or a toothache) UNUSUAL RASH, SWELLING OR PAIN  UNUSUAL VAGINAL DISCHARGE OR ITCHING   Items with * indicate a potential emergency and should be followed up as soon as possible or go to the Emergency Department if any problems should occur.  Please show the CHEMOTHERAPY ALERT CARD or IMMUNOTHERAPY ALERT CARD at check-in to the Emergency Department and triage nurse.  Should you have questions after your visit or need to cancel or reschedule your appointment, please contact [facility]  Dept: [phone]  and follow the prompts.  Office hours are 8:00 a.m. to 4:30 p.m. Monday - Friday. Please note that voicemails  left after 4:00 p.m. may not be returned until the following business day.  We are closed weekends and major holidays. You have access to a nurse at all times for urgent questions. Please call the main number to the clinic Dept: [phone] and follow the prompts.   For any non-urgent questions, you may also contact your provider using MyChart. We now offer e-Visits for anyone 58 and older to request care online for  non-urgent symptoms. For details visit [email or web address].   Also download the MyChart app! Go to the app store, search "MyChart", open the app, select [facility], and log in with your MyChart username and password.  Masks are optional in the cancer centers. If you would like for your care team to wear a mask while they are taking care of you, please let them know. For doctor visits, patients may have with them one support person who is at least 54 years old. At this time, visitors are not allowed in the infusion area.  Oxaliplatin Injection What is this medication? OXALIPLATIN (ox AL i PLA tin) is a chemotherapy drug. It targets fast dividing cells, like cancer cells, and causes these cells to die. This medicine is used to treat cancers of the colon and rectum, and many other cancers. This medicine may be used for other purposes; ask your health care provider or pharmacist if you have questions. COMMON BRAND NAME(S): Eloxatin What should I tell my care team before I take this medication? They need to know if you have any of these conditions: heart disease history of irregular heartbeat liver disease low blood counts, like white cells, platelets, or red blood cells lung or breathing disease, like asthma take medicines that treat or prevent blood clots tingling of the fingers or toes, or other nerve disorder an unusual or allergic reaction to oxaliplatin, other chemotherapy, other medicines, foods, dyes, or preservatives pregnant or trying to get pregnant breast-feeding How should I use this medication? This drug is given as an infusion into a vein. It is administered in a hospital or clinic by a specially trained health care professional. Talk to your pediatrician regarding the use of this medicine in children. Special care may be needed. Overdosage: If you think you have taken too much of this medicine contact a poison control center or emergency room at once. NOTE: This medicine is  only for you. Do not share this medicine with others. What if I miss a dose? It is important not to miss a dose. Call your doctor or health care professional if you are unable to keep an appointment. What may interact with this medication? Do not take this medicine with any of the following medications: cisapride dronedarone pimozide thioridazine This medicine may also interact with the following medications: aspirin and aspirin-like medicines certain medicines that treat or prevent blood clots like warfarin, apixaban, dabigatran, and rivaroxaban cisplatin cyclosporine diuretics medicines for infection like acyclovir, adefovir, amphotericin B, bacitracin, cidofovir, foscarnet, ganciclovir, gentamicin, pentamidine, vancomycin NSAIDs, medicines for pain and inflammation, like ibuprofen or naproxen other medicines that prolong the QT interval (an abnormal heart rhythm) pamidronate zoledronic acid This list may not describe all possible interactions. Give your health care provider a list of all the medicines, herbs, non-prescription drugs, or dietary supplements you use. Also tell them if you smoke, drink alcohol, or use illegal drugs. Some items may interact with your medicine. What should I watch for while using this medication? Your condition will be monitored carefully while you are receiving this medicine.  You may need blood work done while you are taking this medicine. This medicine may make you feel generally unwell. This is not uncommon as chemotherapy can affect healthy cells as well as cancer cells. Report any side effects. Continue your course of treatment even though you feel ill unless your healthcare professional tells you to stop. This medicine can make you more sensitive to cold. Do not drink cold drinks or use ice. Cover exposed skin before coming in contact with cold temperatures or cold objects. When out in cold weather wear warm clothing and cover your mouth and nose to warm  the air that goes into your lungs. Tell your doctor if you get sensitive to the cold. Do not become pregnant while taking this medicine or for 9 months after stopping it. Women should inform their health care professional if they wish to become pregnant or think they might be pregnant. Men should not father a child while taking this medicine and for 6 months after stopping it. There is potential for serious side effects to an unborn child. Talk to your health care professional for more information. Do not breast-feed a child while taking this medicine or for 3 months after stopping it. This medicine has caused ovarian failure in some women. This medicine may make it more difficult to get pregnant. Talk to your health care professional if you are concerned about your fertility. This medicine has caused decreased sperm counts in some men. This may make it more difficult to father a child. Talk to your health care professional if you are concerned about your fertility. This medicine may increase your risk of getting an infection. Call your health care professional for advice if you get a fever, chills, or sore throat, or other symptoms of a cold or flu. Do not treat yourself. Try to avoid being around people who are sick. Avoid taking medicines that contain aspirin, acetaminophen, ibuprofen, naproxen, or ketoprofen unless instructed by your health care professional. These medicines may hide a fever. Be careful brushing or flossing your teeth or using a toothpick because you may get an infection or bleed more easily. If you have any dental work done, tell your dentist you are receiving this medicine. What side effects may I notice from receiving this medication? Side effects that you should report to your doctor or health care professional as soon as possible: allergic reactions like skin rash, itching or hives, swelling of the face, lips, or tongue breathing problems cough low blood counts - this medicine  may decrease the number of white blood cells, red blood cells, and platelets. You may be at increased risk for infections and bleeding nausea, vomiting pain, redness, or irritation at site where injected pain, tingling, numbness in the hands or feet signs and symptoms of bleeding such as bloody or black, tarry stools; red or dark brown urine; spitting up blood or brown material that looks like coffee grounds; red spots on the skin; unusual bruising or bleeding from the eyes, gums, or nose signs and symptoms of a dangerous change in heartbeat or heart rhythm like chest pain; dizziness; fast, irregular heartbeat; palpitations; feeling faint or lightheaded; falls signs and symptoms of infection like fever; chills; cough; sore throat; pain or trouble passing urine signs and symptoms of liver injury like dark yellow or brown urine; general ill feeling or flu-like symptoms; light-colored stools; loss of appetite; nausea; right upper belly pain; unusually weak or tired; yellowing of the eyes or skin signs and symptoms of low  red blood cells or anemia such as unusually weak or tired; feeling faint or lightheaded; falls signs and symptoms of muscle injury like dark urine; trouble passing urine or change in the amount of urine; unusually weak or tired; muscle pain; back pain Side effects that usually do not require medical attention (report to your doctor or health care professional if they continue or are bothersome): changes in taste diarrhea gas hair loss loss of appetite mouth sores This list may not describe all possible side effects. Call your doctor for medical advice about side effects. You may report side effects to FDA at 1-800-FDA-1088. Where should I keep my medication? This drug is given in a hospital or clinic and will not be stored at home. NOTE: This sheet is a summary. It may not cover all possible information. If you have questions about this medicine, talk to your doctor, pharmacist, or  health care provider.  2023 Elsevier/Gold Standard (2020-12-29 00:00:00)  Irinotecan injection What is this medication? IRINOTECAN (ir in oh TEE kan ) is a chemotherapy drug. It is used to treat colon and rectal cancer. This medicine may be used for other purposes; ask your health care provider or pharmacist if you have questions. COMMON BRAND NAME(S): Camptosar What should I tell my care team before I take this medication? They need to know if you have any of these conditions: dehydration diarrhea infection (especially a virus infection such as chickenpox, cold sores, or herpes) liver disease low blood counts, like low white cell, platelet, or red cell counts low levels of calcium, magnesium, or potassium in the blood recent or ongoing radiation therapy an unusual or allergic reaction to irinotecan, other medicines, foods, dyes, or preservatives pregnant or trying to get pregnant breast-feeding How should I use this medication? This drug is given as an infusion into a vein. It is administered in a hospital or clinic by a specially trained health care professional. Talk to your pediatrician regarding the use of this medicine in children. Special care may be needed. Overdosage: If you think you have taken too much of this medicine contact a poison control center or emergency room at once. NOTE: This medicine is only for you. Do not share this medicine with others. What if I miss a dose? It is important not to miss your dose. Call your doctor or health care professional if you are unable to keep an appointment. What may interact with this medication? Do not take this medicine with any of the following medications: cobicistat itraconazole This medicine may interact with the following medications: antiviral medicines for HIV or AIDS certain antibiotics like rifampin or rifabutin certain medicines for fungal infections like ketoconazole, posaconazole, and voriconazole certain medicines  for seizures like carbamazepine, phenobarbital, phenotoin clarithromycin gemfibrozil nefazodone St. John's Wort This list may not describe all possible interactions. Give your health care provider a list of all the medicines, herbs, non-prescription drugs, or dietary supplements you use. Also tell them if you smoke, drink alcohol, or use illegal drugs. Some items may interact with your medicine. What should I watch for while using this medication? Your condition will be monitored carefully while you are receiving this medicine. You will need important blood work done while you are taking this medicine. This drug may make you feel generally unwell. This is not uncommon, as chemotherapy can affect healthy cells as well as cancer cells. Report any side effects. Continue your course of treatment even though you feel ill unless your doctor tells you to stop.  In some cases, you may be given additional medicines to help with side effects. Follow all directions for their use. You may get drowsy or dizzy. Do not drive, use machinery, or do anything that needs mental alertness until you know how this medicine affects you. Do not stand or sit up quickly, especially if you are an older patient. This reduces the risk of dizzy or fainting spells. Call your health care professional for advice if you get a fever, chills, or sore throat, or other symptoms of a cold or flu. Do not treat yourself. This medicine decreases your body's ability to fight infections. Try to avoid being around people who are sick. Avoid taking products that contain aspirin, acetaminophen, ibuprofen, naproxen, or ketoprofen unless instructed by your doctor. These medicines may hide a fever. This medicine may increase your risk to bruise or bleed. Call your doctor or health care professional if you notice any unusual bleeding. Be careful brushing and flossing your teeth or using a toothpick because you may get an infection or bleed more easily.  If you have any dental work done, tell your dentist you are receiving this medicine. Do not become pregnant while taking this medicine or for 6 months after stopping it. Women should inform their health care professional if they wish to become pregnant or think they might be pregnant. Men should not father a child while taking this medicine and for 3 months after stopping it. There is potential for serious side effects to an unborn child. Talk to your health care professional for more information. Do not breast-feed an infant while taking this medicine or for 7 days after stopping it. This medicine has caused ovarian failure in some women. This medicine may make it more difficult to get pregnant. Talk to your health care professional if you are concerned about your fertility. This medicine has caused decreased sperm counts in some men. This may make it more difficult to father a child. Talk to your health care professional if you are concerned about your fertility. What side effects may I notice from receiving this medication? Side effects that you should report to your doctor or health care professional as soon as possible: allergic reactions like skin rash, itching or hives, swelling of the face, lips, or tongue chest pain diarrhea flushing, runny nose, sweating during infusion low blood counts - this medicine may decrease the number of white blood cells, red blood cells and platelets. You may be at increased risk for infections and bleeding. nausea, vomiting pain, swelling, warmth in the leg signs of decreased platelets or bleeding - bruising, pinpoint red spots on the skin, black, tarry stools, blood in the urine signs of infection - fever or chills, cough, sore throat, pain or difficulty passing urine signs of decreased red blood cells - unusually weak or tired, fainting spells, lightheadedness Side effects that usually do not require medical attention (report to your doctor or health care  professional if they continue or are bothersome): constipation hair loss headache loss of appetite mouth sores stomach pain This list may not describe all possible side effects. Call your doctor for medical advice about side effects. You may report side effects to FDA at 1-800-FDA-1088. Where should I keep my medication? This drug is given in a hospital or clinic and will not be stored at home. NOTE: This sheet is a summary. It may not cover all possible information. If you have questions about this medicine, talk to your doctor, pharmacist, or health care provider.  2023 Elsevier/Gold Standard (2020-12-29 00:00:00)  Leucovorin injection What is this medication? LEUCOVORIN (loo koe VOR in) is used to prevent or treat the harmful effects of some medicines. This medicine is used to treat anemia caused by a low amount of folic acid in the body. It is also used with 5-fluorouracil (5-FU) to treat colon cancer. This medicine may be used for other purposes; ask your health care provider or pharmacist if you have questions. What should I tell my care team before I take this medication? They need to know if you have any of these conditions: anemia from low levels of vitamin B-12 in the blood an unusual or allergic reaction to leucovorin, folic acid, other medicines, foods, dyes, or preservatives pregnant or trying to get pregnant breast-feeding How should I use this medication? This medicine is for injection into a muscle or into a vein. It is given by a health care professional in a hospital or clinic setting. Talk to your pediatrician regarding the use of this medicine in children. Special care may be needed. Overdosage: If you think you have taken too much of this medicine contact a poison control center or emergency room at once. NOTE: This medicine is only for you. Do not share this medicine with others. What if I miss a dose? This does not apply. What may interact with this  medication? capecitabine fluorouracil phenobarbital phenytoin primidone trimethoprim-sulfamethoxazole This list may not describe all possible interactions. Give your health care provider a list of all the medicines, herbs, non-prescription drugs, or dietary supplements you use. Also tell them if you smoke, drink alcohol, or use illegal drugs. Some items may interact with your medicine. What should I watch for while using this medication? Your condition will be monitored carefully while you are receiving this medicine. This medicine may increase the side effects of 5-fluorouracil, 5-FU. Tell your doctor or health care professional if you have diarrhea or mouth sores that do not get better or that get worse. What side effects may I notice from receiving this medication? Side effects that you should report to your doctor or health care professional as soon as possible: allergic reactions like skin rash, itching or hives, swelling of the face, lips, or tongue breathing problems fever, infection mouth sores unusual bleeding or bruising unusually weak or tired Side effects that usually do not require medical attention (report to your doctor or health care professional if they continue or are bothersome): constipation or diarrhea loss of appetite nausea, vomiting This list may not describe all possible side effects. Call your doctor for medical advice about side effects. You may report side effects to FDA at 1-800-FDA-1088. Where should I keep my medication? This drug is given in a hospital or clinic and will not be stored at home. NOTE: This sheet is a summary. It may not cover all possible information. If you have questions about this medicine, talk to your doctor, pharmacist, or health care provider.  2023 Elsevier/Gold Standard (2007-08-06 00:00:00)  Fluorouracil, 5-FU injection What is this medication? FLUOROURACIL, 5-FU (flure oh YOOR a sil) is a chemotherapy drug. It slows the growth  of cancer cells. This medicine is used to treat many types of cancer like breast cancer, colon or rectal cancer, pancreatic cancer, and stomach cancer. This medicine may be used for other purposes; ask your health care provider or pharmacist if you have questions. COMMON BRAND NAME(S): Adrucil What should I tell my care team before I take this medication? They need to know if  you have any of these conditions: blood disorders dihydropyrimidine dehydrogenase (DPD) deficiency infection (especially a virus infection such as chickenpox, cold sores, or herpes) kidney disease liver disease malnourished, poor nutrition recent or ongoing radiation therapy an unusual or allergic reaction to fluorouracil, other chemotherapy, other medicines, foods, dyes, or preservatives pregnant or trying to get pregnant breast-feeding How should I use this medication? This drug is given as an infusion or injection into a vein. It is administered in a hospital or clinic by a specially trained health care professional. Talk to your pediatrician regarding the use of this medicine in children. Special care may be needed. Overdosage: If you think you have taken too much of this medicine contact a poison control center or emergency room at once. NOTE: This medicine is only for you. Do not share this medicine with others. What if I miss a dose? It is important not to miss your dose. Call your doctor or health care professional if you are unable to keep an appointment. What may interact with this medication? Do not take this medicine with any of the following medications: live virus vaccines This medicine may also interact with the following medications: medicines that treat or prevent blood clots like warfarin, enoxaparin, and dalteparin This list may not describe all possible interactions. Give your health care provider a list of all the medicines, herbs, non-prescription drugs, or dietary supplements you use. Also tell  them if you smoke, drink alcohol, or use illegal drugs. Some items may interact with your medicine. What should I watch for while using this medication? Visit your doctor for checks on your progress. This drug may make you feel generally unwell. This is not uncommon, as chemotherapy can affect healthy cells as well as cancer cells. Report any side effects. Continue your course of treatment even though you feel ill unless your doctor tells you to stop. In some cases, you may be given additional medicines to help with side effects. Follow all directions for their use. Call your doctor or health care professional for advice if you get a fever, chills or sore throat, or other symptoms of a cold or flu. Do not treat yourself. This drug decreases your body's ability to fight infections. Try to avoid being around people who are sick. This medicine may increase your risk to bruise or bleed. Call your doctor or health care professional if you notice any unusual bleeding. Be careful brushing and flossing your teeth or using a toothpick because you may get an infection or bleed more easily. If you have any dental work done, tell your dentist you are receiving this medicine. Avoid taking products that contain aspirin, acetaminophen, ibuprofen, naproxen, or ketoprofen unless instructed by your doctor. These medicines may hide a fever. Do not become pregnant while taking this medicine. Women should inform their doctor if they wish to become pregnant or think they might be pregnant. There is a potential for serious side effects to an unborn child. Talk to your health care professional or pharmacist for more information. Do not breast-feed an infant while taking this medicine. Men should inform their doctor if they wish to father a child. This medicine may lower sperm counts. Do not treat diarrhea with over the counter products. Contact your doctor if you have diarrhea that lasts more than 2 days or if it is severe and  watery. This medicine can make you more sensitive to the sun. Keep out of the sun. If you cannot avoid being in the sun, wear  protective clothing and use sunscreen. Do not use sun lamps or tanning beds/booths. What side effects may I notice from receiving this medication? Side effects that you should report to your doctor or health care professional as soon as possible: allergic reactions like skin rash, itching or hives, swelling of the face, lips, or tongue low blood counts - this medicine may decrease the number of white blood cells, red blood cells and platelets. You may be at increased risk for infections and bleeding. signs of infection - fever or chills, cough, sore throat, pain or difficulty passing urine signs of decreased platelets or bleeding - bruising, pinpoint red spots on the skin, black, tarry stools, blood in the urine signs of decreased red blood cells - unusually weak or tired, fainting spells, lightheadedness breathing problems changes in vision chest pain mouth sores nausea and vomiting pain, swelling, redness at site where injected pain, tingling, numbness in the hands or feet redness, swelling, or sores on hands or feet stomach pain unusual bleeding Side effects that usually do not require medical attention (report to your doctor or health care professional if they continue or are bothersome): changes in finger or toe nails diarrhea dry or itchy skin hair loss headache loss of appetite sensitivity of eyes to the light stomach upset unusually teary eyes This list may not describe all possible side effects. Call your doctor for medical advice about side effects. You may report side effects to FDA at 1-800-FDA-1088. Where should I keep my medication? This drug is given in a hospital or clinic and will not be stored at home. NOTE: This sheet is a summary. It may not cover all possible information. If you have questions about this medicine, talk to your doctor,  pharmacist, or health care provider.  2023 Elsevier/Gold Standard (2020-12-29 00:00:00)

## 2021-10-03 NOTE — Progress Notes (Signed)
  [facility] OFFICE PROGRESS NOTE   Diagnosis: Pancreas cancer  INTERVAL HISTORY:   Tara Jensen returns as scheduled.  She completed cycle 4 FOLFIRINOX 09/19/2021.  She had no significant nausea/vomiting.  No mouth sores.  No diarrhea.  Cold sensitivity resolves between treatments.  No persistent neuropathy symptoms.  Appetite continues to be improved.  She was able to walk 5 miles yesterday.  She has developed a blister on the right great toe.  Pain is well controlled.  Objective:  Vital signs in last 24 hours:  Blood pressure 107/68, pulse 80, temperature 98.2 F (36.8 C), temperature source Oral, resp. rate 18, height $RemoveBe'5\' 10"'LYfoqBJlH$  (1.778 m), weight 109 lb 3.2 oz (49.5 kg), last menstrual period 11/01/2018, SpO2 100 %.    HEENT: No thrush or ulcers. Resp: Lungs clear bilaterally. Cardio: Regular rate and rhythm. GI: No hepatomegaly. Vascular: No leg edema. Skin: Palms without erythema.  Blister type lesion right great toe.  Soles without erythema. 40 cath without erythema.  Lab Results:  Lab Results  Component Value Date   WBC 9.5 10/03/2021   HGB 10.1 (L) 10/03/2021   HCT 31.1 (L) 10/03/2021   MCV 99.0 10/03/2021   PLT 142 (L) 10/03/2021   NEUTROABS 7.2 10/03/2021    Imaging:  No results found.  Medications: I have reviewed the patient's current medications.  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  Less than 180 degree abutment of the distal celiac, common hepatic, and splenic arteries, less than 180 degree abutment of the SMA CT chest 07/18/2021-no evidence of metastatic disease EUS 07/16/2021-extrinsic impression on the stomach at the posterior wall of the gastric body, no gross lesion in the duodenum, 45 x 36 mm pancreas body mass, abutment  of the SMA, celiac trunk, and compression of the splenoportal confluence.  No malignant appearing lymph nodes, numerous venous collaterals adjacent to the portal vein, FNA biopsy-malignant cells consistent with adenocarcinoma, T4N0 by EUS Markedly elevated CA 19-9 Guardant360 07/31/2021-K-ras G12R, MSI high-not detected, ATM VUS Cycle 1 FOLFOX 08/08/2021 Cycle 2 FOLFIRINOX 08/22/2021 CT abdomen[facility]pelvis at [facility] 08/31/2021-hypoattenuating liver lesions consistent with metastases, mass in the pancreas neck/body with greater than 100 degree encasement of the celiac axis and common hepatic artery.  Less than 180 degree abutment of the SMA, long segment occlusion of the portal splenic confluence, splenic vein occluded, multiple collateral vessels in the upper abdomen, prominent left periaortic node Cycle 3 FOLFIRINOX 09/05/2021 Cycle 4 FOLFIRINOX 09/19/2021 Cycle 5 FOLFIRINOX 10/03/2021 Pain secondary to #1-controlled with tramadol and Tylenol. Weight loss secondary to #1 History of situational depression Family history of breast cancer  Disposition: Tara Jensen appears well.  She has completed 4 cycles of FOLFIRINOX.  She is tolerating treatment well.  Performance status continues to improve.  Plan to proceed with cycle 5 today as scheduled.  Restaging CTs prior to next office visit.  CBC and chemistry panel reviewed.  Labs adequate to proceed as above.  She will return for follow-up in 2 weeks.  Refill for tramadol sent to her pharmacy.   Ned Card ANP/GNP-BC   10/03/2021  8:52 AM

## 2021-10-03 NOTE — Progress Notes (Signed)
Patient seen by Lisa Thomas NP today  Vitals are within treatment parameters.  Labs reviewed by Lisa Thomas NP and are within treatment parameters.  Per physician team, patient is ready for treatment and there are NO modifications to the treatment plan.     

## 2021-10-04 LAB — CANCER ANTIGEN 19-9: CA 19-9: 2320 U/mL — ABNORMAL HIGH (ref 0–35)

## 2021-10-05 ENCOUNTER — Telehealth: Payer: Self-pay

## 2021-10-05 ENCOUNTER — Inpatient Hospital Stay: Payer: BC Managed Care – PPO

## 2021-10-05 VITALS — BP 109/69 | HR 64 | Temp 97.5°F | Resp 18

## 2021-10-05 DIAGNOSIS — C251 Malignant neoplasm of body of pancreas: Secondary | ICD-10-CM | POA: Diagnosis not present

## 2021-10-05 MED ORDER — PEGFILGRASTIM-CBQV 6 MG/0.6ML ~~LOC~~ SOSY
6.0000 mg | PREFILLED_SYRINGE | Freq: Once | SUBCUTANEOUS | Status: AC
  Administered 2021-10-05: 6 mg via SUBCUTANEOUS
  Filled 2021-10-05: qty 0.6

## 2021-10-05 MED ORDER — HEPARIN SOD (PORK) LOCK FLUSH 100 UNIT/ML IV SOLN
500.0000 [IU] | Freq: Once | INTRAVENOUS | Status: AC | PRN
  Administered 2021-10-05: 500 [IU]

## 2021-10-05 MED ORDER — SODIUM CHLORIDE 0.9 % IV SOLN: INTRAVENOUS | Status: AC

## 2021-10-05 MED ORDER — SODIUM CHLORIDE 0.9% FLUSH
10.0000 mL | INTRAVENOUS | Status: DC | PRN
  Administered 2021-10-05: 10 mL

## 2021-10-05 NOTE — Telephone Encounter (Signed)
Patient verbal understanding and had no further questions and concerns.

## 2021-10-05 NOTE — Telephone Encounter (Signed)
-----   Message from Owens Shark, NP sent at 10/05/2021  8:17 AM EDT ----- Please let her know the CA 19-9 is better

## 2021-10-05 NOTE — Patient Instructions (Signed)

## 2021-10-15 ENCOUNTER — Other Ambulatory Visit: Payer: Self-pay | Admitting: Oncology

## 2021-10-15 DIAGNOSIS — C251 Malignant neoplasm of body of pancreas: Secondary | ICD-10-CM

## 2021-10-16 ENCOUNTER — Ambulatory Visit (HOSPITAL_COMMUNITY)
Admission: RE | Admit: 2021-10-16 | Discharge: 2021-10-16 | Disposition: A | Discharge status: Home / Self Care | Payer: BC Managed Care – PPO | Source: Ambulatory Visit | Attending: Nurse Practitioner | Admitting: Nurse Practitioner

## 2021-10-16 DIAGNOSIS — C251 Malignant neoplasm of body of pancreas: Secondary | ICD-10-CM | POA: Insufficient documentation

## 2021-10-16 MED ORDER — IOHEXOL 9 MG/ML PO SOLN
ORAL | Status: AC
  Filled 2021-10-16: qty 1000

## 2021-10-16 MED ORDER — SODIUM CHLORIDE (PF) 0.9 % IJ SOLN
INTRAMUSCULAR | Status: AC
  Filled 2021-10-16: qty 50

## 2021-10-16 MED ORDER — IOHEXOL 9 MG/ML PO SOLN
1000.0000 mL | ORAL | Status: AC
  Administered 2021-10-16: 1000 mL via ORAL

## 2021-10-16 MED ORDER — IOHEXOL 300 MG/ML  SOLN
100.0000 mL | Freq: Once | INTRAMUSCULAR | Status: AC | PRN
Start: 2021-10-16 — End: 2021-10-16
  Administered 2021-10-16: 100 mL via INTRAVENOUS

## 2021-10-16 MED ORDER — HEPARIN SOD (PORK) LOCK FLUSH 100 UNIT/ML IV SOLN
INTRAVENOUS | Status: AC
  Filled 2021-10-16: qty 5

## 2021-10-16 MED ORDER — HEPARIN SOD (PORK) LOCK FLUSH 100 UNIT/ML IV SOLN
500.0000 [IU] | Freq: Once | INTRAVENOUS | Status: AC
  Administered 2021-10-16: 500 [IU] via INTRAVENOUS

## 2021-10-17 ENCOUNTER — Inpatient Hospital Stay: Payer: BC Managed Care – PPO

## 2021-10-17 ENCOUNTER — Inpatient Hospital Stay (HOSPITAL_BASED_OUTPATIENT_CLINIC_OR_DEPARTMENT_OTHER): Payer: BC Managed Care – PPO | Admitting: Oncology

## 2021-10-17 ENCOUNTER — Other Ambulatory Visit: Payer: Self-pay | Admitting: *Deleted

## 2021-10-17 ENCOUNTER — Inpatient Hospital Stay: Payer: BC Managed Care – PPO | Attending: Oncology

## 2021-10-17 VITALS — BP 113/70 | HR 79 | Temp 98.1°F | Resp 16 | Ht 70.0 in | Wt 109.6 lb

## 2021-10-17 DIAGNOSIS — Z5111 Encounter for antineoplastic chemotherapy: Secondary | ICD-10-CM | POA: Insufficient documentation

## 2021-10-17 DIAGNOSIS — C251 Malignant neoplasm of body of pancreas: Secondary | ICD-10-CM | POA: Insufficient documentation

## 2021-10-17 DIAGNOSIS — I81 Portal vein thrombosis: Secondary | ICD-10-CM | POA: Diagnosis not present

## 2021-10-17 DIAGNOSIS — R110 Nausea: Secondary | ICD-10-CM

## 2021-10-17 DIAGNOSIS — G35 Multiple sclerosis: Secondary | ICD-10-CM | POA: Diagnosis not present

## 2021-10-17 DIAGNOSIS — C787 Secondary malignant neoplasm of liver and intrahepatic bile duct: Secondary | ICD-10-CM | POA: Diagnosis present

## 2021-10-17 DIAGNOSIS — Z79899 Other long term (current) drug therapy: Secondary | ICD-10-CM | POA: Insufficient documentation

## 2021-10-17 DIAGNOSIS — Z95828 Presence of other vascular implants and grafts: Secondary | ICD-10-CM

## 2021-10-17 LAB — CBC WITH DIFFERENTIAL (CANCER CENTER ONLY)
Abs Immature Granulocytes: 0.21 10*3/uL — ABNORMAL HIGH (ref 0.00–0.07)
Basophils Absolute: 0.1 10*3/uL (ref 0.0–0.1)
Basophils Relative: 1 %
Eosinophils Absolute: 0.2 10*3/uL (ref 0.0–0.5)
Eosinophils Relative: 2 %
HCT: 32.4 % — ABNORMAL LOW (ref 36.0–46.0)
Hemoglobin: 10.5 g/dL — ABNORMAL LOW (ref 12.0–15.0)
Immature Granulocytes: 2 %
Lymphocytes Relative: 18 %
Lymphs Abs: 1.6 10*3/uL (ref 0.7–4.0)
MCH: 32 pg (ref 26.0–34.0)
MCHC: 32.4 g/dL (ref 30.0–36.0)
MCV: 98.8 fL (ref 80.0–100.0)
Monocytes Absolute: 0.6 10*3/uL (ref 0.1–1.0)
Monocytes Relative: 7 %
Neutro Abs: 6.3 10*3/uL (ref 1.7–7.7)
Neutrophils Relative %: 70 %
Platelet Count: 162 10*3/uL (ref 150–400)
RBC: 3.28 MIL/uL — ABNORMAL LOW (ref 3.87–5.11)
RDW: 18.5 % — ABNORMAL HIGH (ref 11.5–15.5)
WBC Count: 8.9 10*3/uL (ref 4.0–10.5)
nRBC: 0 % (ref 0.0–0.2)

## 2021-10-17 LAB — CMP (CANCER CENTER ONLY)
ALT: 27 U/L (ref 0–44)
AST: 24 U/L (ref 15–41)
Albumin: 4.2 g/dL (ref 3.5–5.0)
Alkaline Phosphatase: 106 U/L (ref 38–126)
Anion gap: 8 (ref 5–15)
BUN: 17 mg/dL (ref 6–20)
CO2: 28 mmol/L (ref 22–32)
Calcium: 9 mg/dL (ref 8.9–10.3)
Chloride: 105 mmol/L (ref 98–111)
Creatinine: 0.63 mg/dL (ref 0.44–1.00)
GFR, Estimated: >60 mL/min (ref >60)
Glucose, Bld: 106 mg/dL — ABNORMAL HIGH (ref 70–99)
Potassium: 3.9 mmol/L (ref 3.5–5.1)
Sodium: 141 mmol/L (ref 135–145)
Total Bilirubin: 0.2 mg/dL — ABNORMAL LOW (ref 0.3–1.2)
Total Protein: 6.9 g/dL (ref 6.5–8.1)

## 2021-10-17 MED ORDER — SODIUM CHLORIDE 0.9 % IV SOLN
2400.0000 mg/m2 | INTRAVENOUS | Status: DC
  Administered 2021-10-17: 3750 mg via INTRAVENOUS
  Filled 2021-10-17: qty 75

## 2021-10-17 MED ORDER — SODIUM CHLORIDE 0.9 % IV SOLN
150.0000 mg | Freq: Once | INTRAVENOUS | Status: AC
  Administered 2021-10-17: 150 mg via INTRAVENOUS
  Filled 2021-10-17: qty 150

## 2021-10-17 MED ORDER — DEXTROSE 5 % IV SOLN: Freq: Once | INTRAVENOUS | Status: AC

## 2021-10-17 MED ORDER — SODIUM CHLORIDE 0.9 % IV SOLN: Freq: Once | INTRAVENOUS | Status: DC

## 2021-10-17 MED ORDER — ATROPINE SULFATE 1 MG/ML IV SOLN
0.5000 mg | Freq: Once | INTRAVENOUS | Status: AC | PRN
  Administered 2021-10-17: 0.5 mg via INTRAVENOUS
  Filled 2021-10-17: qty 1

## 2021-10-17 MED ORDER — SODIUM CHLORIDE 0.9 % IV SOLN
10.0000 mg | Freq: Once | INTRAVENOUS | Status: AC
  Administered 2021-10-17: 10 mg via INTRAVENOUS
  Filled 2021-10-17: qty 10

## 2021-10-17 MED ORDER — SODIUM CHLORIDE 0.9 % IV SOLN
400.0000 mg/m2 | Freq: Once | INTRAVENOUS | Status: AC
  Administered 2021-10-17: 624 mg via INTRAVENOUS
  Filled 2021-10-17: qty 25

## 2021-10-17 MED ORDER — SODIUM CHLORIDE 0.9 % IV SOLN
150.0000 mg/m2 | Freq: Once | INTRAVENOUS | Status: AC
  Administered 2021-10-17: 240 mg via INTRAVENOUS
  Filled 2021-10-17: qty 2

## 2021-10-17 MED ORDER — PALONOSETRON HCL INJECTION 0.25 MG/5ML
0.2500 mg | Freq: Once | INTRAVENOUS | Status: AC
  Administered 2021-10-17: 0.25 mg via INTRAVENOUS
  Filled 2021-10-17: qty 5

## 2021-10-17 MED ORDER — SODIUM CHLORIDE 0.9% FLUSH
10.0000 mL | INTRAVENOUS | Status: DC | PRN
  Administered 2021-10-17: 10 mL

## 2021-10-17 MED ORDER — OXALIPLATIN CHEMO INJECTION 100 MG/20ML
85.0000 mg/m2 | Freq: Once | INTRAVENOUS | Status: AC
  Administered 2021-10-17: 135 mg via INTRAVENOUS
  Filled 2021-10-17: qty 20

## 2021-10-17 NOTE — Progress Notes (Signed)
[place] OFFICE PROGRESS NOTE   Diagnosis: Pancreas cancer  INTERVAL HISTORY:   Ms. Schemm returns as scheduled.  She complete another cycle FOLFIRINOX on 10/03/2021.  No nausea/vomiting, mouth sores, or diarrhea.  Cold sensitivity lasted for several days following chemotherapy.  No neuropathy symptoms at present.  She takes tramadol as needed for pain.  She is no longer taking Tylenol.  Good appetite.  She is exercising. She reports developing a blister at the right great toe.  The blister opened and the skin is sloughing. Objective:  Vital signs in last 24 hours:  Blood pressure 113/70, pulse 79, temperature 98.1 F (36.7 C), temperature source Oral, resp. rate 16, height _0  (1.778 m), weight 109 lb 9.6 oz (49.7 kg), last menstrual period 11/01/2018, SpO2 100 %.    HEENT: No thrush or ulcers Resp: Lungs clear bilaterally Cardio: Regular rate and rhythm GI: Nontender, no mass, no hepatosplenomegaly, no apparent ascites Vascular: No leg edema Neuro: Mild loss of vibratory sense at the fingertips bilaterally Skin: Superficial desquamation at the distal aspect of the right great toe  Portacath/PICC-without erythema  Lab Results:  Lab Results  Component Value Date   WBC 8.9 10/17/2021   HGB 10.5 (L) 10/17/2021   HCT 32.4 (L) 10/17/2021   MCV 98.8 10/17/2021   PLT 162 10/17/2021   NEUTROABS 6.3 10/17/2021    CMP  Lab Results  Component Value Date   NA 141 10/17/2021   K 3.9 10/17/2021   CL 105 10/17/2021   CO2 28 10/17/2021   GLUCOSE 106 (H) 10/17/2021   BUN 17 10/17/2021   CREATININE 0.63 10/17/2021   CALCIUM 9.0 10/17/2021   PROT 6.9 10/17/2021   ALBUMIN 4.2 10/17/2021   AST 24 10/17/2021   ALT 27 10/17/2021   ALKPHOS 106 10/17/2021   BILITOT 0.2 (L) 10/17/2021   GFRNONAA >60 10/17/2021   GFRAA 76 09/08/2019    Lab Results  Component Value Date   ZOX096 2,320 (H) 10/03/2021    No results found for: "INR",  "LABPROT"  Imaging:  CT CHEST ABDOMEN PELVIS W CONTRAST  Result Date: 10/16/2021 CLINICAL DATA:  Restaging of pancreatic cancer. Chemotherapy in progress. No current complaints. * Tracking Code: BO * EXAM: CT CHEST, ABDOMEN, AND PELVIS WITH CONTRAST TECHNIQUE: Multidetector CT imaging of the chest, abdomen and pelvis was performed following the standard protocol during bolus administration of intravenous contrast. RADIATION DOSE REDUCTION: This exam was performed according to the departmental dose-optimization program which includes automated exposure control, adjustment of the mA and/or kV according to patient size and/or use of iterative reconstruction technique. CONTRAST:  151m OMNIPAQUE IOHEXOL 300 MG/ML  SOLN COMPARISON:  Chest CT 07/18/2021. Abdominopelvic CT 07/11/2021. [facility]. FINDINGS: CT CHEST FINDINGS Cardiovascular: Right Port-A-Cath tip low SVC. Normal aortic caliber. Normal heart size, without pericardial effusion. No central pulmonary embolism, on this non-dedicated study. Mediastinum/Nodes: Left supraclavicular/low jugular chain nodes including at 4 mm on 11/07, decreased from maximally 10 x 13 mm on the prior exam (when remeasured). No axillary, mediastinal, or hilar adenopathy. Lungs/Pleura: No pleural fluid.  Clear lungs. Musculoskeletal: No acute osseous abnormality. CT ABDOMEN PELVIS FINDINGS Hepatobiliary: Index posterior right hepatic lobe 11 mm hypoattenuating lesion on 121/7 is less distinct than at 1.5 cm on 08/31/2021 (when remeasured). A probable segment 2 subcapsular 1.4 cm hypoattenuating lesion on 96/7 measured 1.6 cm on 08/31/2021 (when remeasured). Other scattered subcentimeter hypoattenuating lesions on that exam  are less distinct today. No new or enlarging lesion. Normal gallbladder, without biliary ductal dilatation. Pancreas: Pancreatic body and tail duct dilatation and atrophy to the level of a  pancreatic neck hypoattenuating mass which measures 2.7 x 2.4 cm on 116/7 versus 4.0 x 3.5 cm on 08/31/2021. No superimposed pancreatitis. Spleen: Normal in size, without focal abnormality. Adrenals/Urinary Tract: Normal adrenal glands. No suspicious renal lesion or hydronephrosis. Normal urinary bladder. Stomach/Bowel: Normal stomach, without wall thickening. Colonic stool burden suggests constipation. Normal terminal ileum. Normal small bowel. Vascular/Lymphatic: Celiac and SMA contact with tumor again identified. The splenoportal confluence is involved with tumor with resultant upper abdominal portosystemic collaterals. Left periaortic 5 mm node on 122/7 is decreased from 8 mm on the prior. No abdominopelvic adenopathy. Reproductive: Normal uterus and adnexa. Other: No significant free fluid. No evidence of omental or peritoneal disease. No free intraperitoneal air. Musculoskeletal: Right pubic bone subcentimeter sclerotic lesion is unchanged and likely a bone island. IMPRESSION: 1. Response to therapy since the most recent abdominal outside CT of 09/06/2021. Decreased size of pancreatic primary, hepatic metastasis, and suspicious abdominal retroperitoneal node. 2. Resolution of left supraclavicular/low jugular adenopathy since the chest CT of 07/18/2021. 3. No new or progressive disease. 4.  Possible constipation. Electronically Signed   By: Abigail Miyamoto M.D.   On: 10/16/2021 16:43    Medications: I have reviewed the patient's current medications.   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  Less than 180 degree abutment of the distal celiac, common hepatic, and splenic arteries, less than 180 degree abutment of the SMA CT chest 07/18/2021-no evidence of metastatic disease EUS  07/16/2021-extrinsic impression on the stomach at the posterior wall of the gastric body, no gross lesion in the duodenum, 45 x 36 mm pancreas body mass, abutment of the SMA, celiac trunk, and compression of the splenoportal confluence.  No malignant appearing lymph nodes, numerous venous collaterals adjacent to the portal vein, FNA biopsy-malignant cells consistent with adenocarcinoma, T4N0 by EUS Markedly elevated CA 19-9 Guardant360 07/31/2021-K-ras G12R, MSI high-not detected, ATM VUS Cycle 1 FOLFOX 08/08/2021 Cycle 2 FOLFIRINOX 08/22/2021 CT abdomen/pelvis at [facility] 08/31/2021-hypoattenuating liver lesions consistent with metastases, mass in the pancreas neck/body with greater than 100 degree encasement of the celiac axis and common hepatic artery.  Less than 180 degree abutment of the SMA, long segment occlusion of the portal splenic confluence, splenic vein occluded, multiple collateral vessels in the upper abdomen, prominent left periaortic node Cycle 3 FOLFIRINOX 09/05/2021 Cycle 4 FOLFIRINOX 09/19/2021 Cycle 5 FOLFIRINOX 10/03/2021 CTs 10/16/2021-decrease size of primary pancreas mass, hepatic metastases, and a suspicious retroperitoneal node, resolution of left supraclavicular/low jugular adenopathy compared to a CT chest 07/18/2021.  No evidence of disease progression. Cycle 6 FOLFIRINOX 10/17/2021 Pain secondary to #1-controlled with tramadol and Tylenol. Weight loss secondary to #1 History of situational depression Family history of breast cancer    Disposition: Ms. Slaugh has metastatic pancreas cancer.  She has completed 5 cycles of FOLFIRINOX.  She has tolerated the chemotherapy well.  The pancreas tumor has responded to FOLFIRINOX.  Her performance status and pain have improved.  The CA 19-9 is lower and the restaging CTs yesterday are consistent with a response to treatment.  I reviewed the CT findings and images with Ms. Mccurdy.  I recommend continuing FOLFIRINOX.  She will complete cycle 6  today.  She will return for an office visit and chemotherapy in 2 weeks.  We will plan for a restaging CT after cycle 10 and then decide on a maintenance regimen.  I will contact the [facility] regarding  clinical trials there.  Betsy Coder, MD  10/17/2021  1:17 PM

## 2021-10-17 NOTE — Patient Instructions (Addendum)
[place]  The chemotherapy medication bag should finish at 46 hours, 96 hours, or 7 days. For example, if your pump is scheduled for 46 hours and it was put on at 4:00 p.m., it should finish at 2:00 p.m. the day it is scheduled to come off regardless of your appointment time.     Estimated time to finish at 12:30pm Friday, September 8th, 2023.   If the display on your pump reads "Low Volume" and it is beeping, take the batteries out of the pump and come to the cancer center for it to be taken off.   If the pump alarms go off prior to the pump reading "Low Volume" then call [phone] and someone can assist you.  If the plunger comes out and the chemotherapy medication is leaking out, please use your home chemo spill kit to clean up the spill. Do NOT use paper towels or other household products.  If you have problems or questions regarding your pump, please call either [phone] (24 hours a day) or the cancer center Monday-Friday 8:00 a.m.- 4:30 p.m. at the clinic number and we will assist you. If you are unable to get assistance, then go to the nearest Emergency Department and ask the staff to contact the IV team for assistance.   Discharge Instructions: Thank you for choosing [facility] to provide your oncology and hematology care.   If you have a lab appointment with the [facility], please go directly to the [facility] and check in at the registration area.   Wear comfortable clothing and clothing appropriate for easy access to any Portacath or PICC line.   We strive to give you quality time with your provider. You may need to reschedule your appointment if you arrive late (15 or more minutes).  Arriving late affects you and other patients whose appointments are after yours.  Also, if you miss three or more appointments without notifying the office, you may be dismissed from the clinic at the provider's discretion.      For  prescription refill requests, have your pharmacy contact our office and allow 72 hours for refills to be completed.    Today you received the following chemotherapy and/or immunotherapy agents Oxaliplatin,Irinotecan, Leucovorin, Fluorouracil      To help prevent nausea and vomiting after your treatment, we encourage you to take your nausea medication as directed.  BELOW ARE SYMPTOMS THAT SHOULD BE REPORTED IMMEDIATELY: *FEVER GREATER THAN 100.4 F (38 C) OR HIGHER *CHILLS OR SWEATING *NAUSEA AND VOMITING THAT IS NOT CONTROLLED WITH YOUR NAUSEA MEDICATION *UNUSUAL SHORTNESS OF BREATH *UNUSUAL BRUISING OR BLEEDING *URINARY PROBLEMS (pain or burning when urinating, or frequent urination) *BOWEL PROBLEMS (unusual diarrhea, constipation, pain near the anus) TENDERNESS IN MOUTH AND THROAT WITH OR WITHOUT PRESENCE OF ULCERS (sore throat, sores in mouth, or a toothache) UNUSUAL RASH, SWELLING OR PAIN  UNUSUAL VAGINAL DISCHARGE OR ITCHING   Items with * indicate a potential emergency and should be followed up as soon as possible or go to the Emergency Department if any problems should occur.  Please show the CHEMOTHERAPY ALERT CARD or IMMUNOTHERAPY ALERT CARD at check-in to the Emergency Department and triage nurse.  Should you have questions after your visit or need to cancel or reschedule your appointment, please contact [facility]  Dept: [phone]  and follow the prompts.  Office hours are 8:00 a.m. to 4:30 p.m. Monday - Friday. Please note that voicemails  left after 4:00 p.m. may not be returned until the following business day.  We are closed weekends and major holidays. You have access to a nurse at all times for urgent questions. Please call the main number to the clinic Dept: [phone] and follow the prompts.   For any non-urgent questions, you may also contact your provider using MyChart. We now offer e-Visits for anyone 90 and older to request care online  for non-urgent symptoms. For details visit [email or web address].   Also download the MyChart app! Go to the app store, search "MyChart", open the app, select [facility], and log in with your MyChart username and password.  Masks are optional in the cancer centers. If you would like for your care team to wear a mask while they are taking care of you, please let them know. For doctor visits, patients may have with them one support person who is at least 54 years old. At this time, visitors are not allowed in the infusion area.  Oxaliplatin Injection What is this medication? OXALIPLATIN (ox AL i PLA tin) is a chemotherapy drug. It targets fast dividing cells, like cancer cells, and causes these cells to die. This medicine is used to treat cancers of the colon and rectum, and many other cancers. This medicine may be used for other purposes; ask your health care provider or pharmacist if you have questions. COMMON BRAND NAME(S): Eloxatin What should I tell my care team before I take this medication? They need to know if you have any of these conditions: heart disease history of irregular heartbeat liver disease low blood counts, like white cells, platelets, or red blood cells lung or breathing disease, like asthma take medicines that treat or prevent blood clots tingling of the fingers or toes, or other nerve disorder an unusual or allergic reaction to oxaliplatin, other chemotherapy, other medicines, foods, dyes, or preservatives pregnant or trying to get pregnant breast-feeding How should I use this medication? This drug is given as an infusion into a vein. It is administered in a hospital or clinic by a specially trained health care professional. Talk to your pediatrician regarding the use of this medicine in children. Special care may be needed. Overdosage: If you think you have taken too much of this medicine contact a poison control center or emergency room at once. NOTE: This medicine  is only for you. Do not share this medicine with others. What if I miss a dose? It is important not to miss a dose. Call your doctor or health care professional if you are unable to keep an appointment. What may interact with this medication? Do not take this medicine with any of the following medications: cisapride dronedarone pimozide thioridazine This medicine may also interact with the following medications: aspirin and aspirin-like medicines certain medicines that treat or prevent blood clots like warfarin, apixaban, dabigatran, and rivaroxaban cisplatin cyclosporine diuretics medicines for infection like acyclovir, adefovir, amphotericin B, bacitracin, cidofovir, foscarnet, ganciclovir, gentamicin, pentamidine, vancomycin NSAIDs, medicines for pain and inflammation, like ibuprofen or naproxen other medicines that prolong the QT interval (an abnormal heart rhythm) pamidronate zoledronic acid This list may not describe all possible interactions. Give your health care provider a list of all the medicines, herbs, non-prescription drugs, or dietary supplements you use. Also tell them if you smoke, drink alcohol, or use illegal drugs. Some items may interact with your medicine. What should I watch for while using this medication? Your condition will be monitored carefully while you are receiving this medicine.  You may need blood work done while you are taking this medicine. This medicine may make you feel generally unwell. This is not uncommon as chemotherapy can affect healthy cells as well as cancer cells. Report any side effects. Continue your course of treatment even though you feel ill unless your healthcare professional tells you to stop. This medicine can make you more sensitive to cold. Do not drink cold drinks or use ice. Cover exposed skin before coming in contact with cold temperatures or cold objects. When out in cold weather wear warm clothing and cover your mouth and nose to  warm the air that goes into your lungs. Tell your doctor if you get sensitive to the cold. Do not become pregnant while taking this medicine or for 9 months after stopping it. Women should inform their health care professional if they wish to become pregnant or think they might be pregnant. Men should not father a child while taking this medicine and for 6 months after stopping it. There is potential for serious side effects to an unborn child. Talk to your health care professional for more information. Do not breast-feed a child while taking this medicine or for 3 months after stopping it. This medicine has caused ovarian failure in some women. This medicine may make it more difficult to get pregnant. Talk to your health care professional if you are concerned about your fertility. This medicine has caused decreased sperm counts in some men. This may make it more difficult to father a child. Talk to your health care professional if you are concerned about your fertility. This medicine may increase your risk of getting an infection. Call your health care professional for advice if you get a fever, chills, or sore throat, or other symptoms of a cold or flu. Do not treat yourself. Try to avoid being around people who are sick. Avoid taking medicines that contain aspirin, acetaminophen, ibuprofen, naproxen, or ketoprofen unless instructed by your health care professional. These medicines may hide a fever. Be careful brushing or flossing your teeth or using a toothpick because you may get an infection or bleed more easily. If you have any dental work done, tell your dentist you are receiving this medicine. What side effects may I notice from receiving this medication? Side effects that you should report to your doctor or health care professional as soon as possible: allergic reactions like skin rash, itching or hives, swelling of the face, lips, or tongue breathing problems cough low blood counts - this  medicine may decrease the number of white blood cells, red blood cells, and platelets. You may be at increased risk for infections and bleeding nausea, vomiting pain, redness, or irritation at site where injected pain, tingling, numbness in the hands or feet signs and symptoms of bleeding such as bloody or black, tarry stools; red or dark brown urine; spitting up blood or brown material that looks like coffee grounds; red spots on the skin; unusual bruising or bleeding from the eyes, gums, or nose signs and symptoms of a dangerous change in heartbeat or heart rhythm like chest pain; dizziness; fast, irregular heartbeat; palpitations; feeling faint or lightheaded; falls signs and symptoms of infection like fever; chills; cough; sore throat; pain or trouble passing urine signs and symptoms of liver injury like dark yellow or brown urine; general ill feeling or flu-like symptoms; light-colored stools; loss of appetite; nausea; right upper belly pain; unusually weak or tired; yellowing of the eyes or skin signs and symptoms of low  red blood cells or anemia such as unusually weak or tired; feeling faint or lightheaded; falls signs and symptoms of muscle injury like dark urine; trouble passing urine or change in the amount of urine; unusually weak or tired; muscle pain; back pain Side effects that usually do not require medical attention (report to your doctor or health care professional if they continue or are bothersome): changes in taste diarrhea gas hair loss loss of appetite mouth sores This list may not describe all possible side effects. Call your doctor for medical advice about side effects. You may report side effects to FDA at 1-800-FDA-1088. Where should I keep my medication? This drug is given in a hospital or clinic and will not be stored at home. NOTE: This sheet is a summary. It may not cover all possible information. If you have questions about this medicine, talk to your doctor,  pharmacist, or health care provider.  2023 Elsevier/Gold Standard (2020-12-29 00:00:00)  Irinotecan injection What is this medication? IRINOTECAN (ir in oh TEE kan ) is a chemotherapy drug. It is used to treat colon and rectal cancer. This medicine may be used for other purposes; ask your health care provider or pharmacist if you have questions. COMMON BRAND NAME(S): Camptosar What should I tell my care team before I take this medication? They need to know if you have any of these conditions: dehydration diarrhea infection (especially a virus infection such as chickenpox, cold sores, or herpes) liver disease low blood counts, like low white cell, platelet, or red cell counts low levels of calcium, magnesium, or potassium in the blood recent or ongoing radiation therapy an unusual or allergic reaction to irinotecan, other medicines, foods, dyes, or preservatives pregnant or trying to get pregnant breast-feeding How should I use this medication? This drug is given as an infusion into a vein. It is administered in a hospital or clinic by a specially trained health care professional. Talk to your pediatrician regarding the use of this medicine in children. Special care may be needed. Overdosage: If you think you have taken too much of this medicine contact a poison control center or emergency room at once. NOTE: This medicine is only for you. Do not share this medicine with others. What if I miss a dose? It is important not to miss your dose. Call your doctor or health care professional if you are unable to keep an appointment. What may interact with this medication? Do not take this medicine with any of the following medications: cobicistat itraconazole This medicine may interact with the following medications: antiviral medicines for HIV or AIDS certain antibiotics like rifampin or rifabutin certain medicines for fungal infections like ketoconazole, posaconazole, and  voriconazole certain medicines for seizures like carbamazepine, phenobarbital, phenotoin clarithromycin gemfibrozil nefazodone St. John's Wort This list may not describe all possible interactions. Give your health care provider a list of all the medicines, herbs, non-prescription drugs, or dietary supplements you use. Also tell them if you smoke, drink alcohol, or use illegal drugs. Some items may interact with your medicine. What should I watch for while using this medication? Your condition will be monitored carefully while you are receiving this medicine. You will need important blood work done while you are taking this medicine. This drug may make you feel generally unwell. This is not uncommon, as chemotherapy can affect healthy cells as well as cancer cells. Report any side effects. Continue your course of treatment even though you feel ill unless your doctor tells you to stop.  In some cases, you may be given additional medicines to help with side effects. Follow all directions for their use. You may get drowsy or dizzy. Do not drive, use machinery, or do anything that needs mental alertness until you know how this medicine affects you. Do not stand or sit up quickly, especially if you are an older patient. This reduces the risk of dizzy or fainting spells. Call your health care professional for advice if you get a fever, chills, or sore throat, or other symptoms of a cold or flu. Do not treat yourself. This medicine decreases your body's ability to fight infections. Try to avoid being around people who are sick. Avoid taking products that contain aspirin, acetaminophen, ibuprofen, naproxen, or ketoprofen unless instructed by your doctor. These medicines may hide a fever. This medicine may increase your risk to bruise or bleed. Call your doctor or health care professional if you notice any unusual bleeding. Be careful brushing and flossing your teeth or using a toothpick because you may get an  infection or bleed more easily. If you have any dental work done, tell your dentist you are receiving this medicine. Do not become pregnant while taking this medicine or for 6 months after stopping it. Women should inform their health care professional if they wish to become pregnant or think they might be pregnant. Men should not father a child while taking this medicine and for 3 months after stopping it. There is potential for serious side effects to an unborn child. Talk to your health care professional for more information. Do not breast-feed an infant while taking this medicine or for 7 days after stopping it. This medicine has caused ovarian failure in some women. This medicine may make it more difficult to get pregnant. Talk to your health care professional if you are concerned about your fertility. This medicine has caused decreased sperm counts in some men. This may make it more difficult to father a child. Talk to your health care professional if you are concerned about your fertility. What side effects may I notice from receiving this medication? Side effects that you should report to your doctor or health care professional as soon as possible: allergic reactions like skin rash, itching or hives, swelling of the face, lips, or tongue chest pain diarrhea flushing, runny nose, sweating during infusion low blood counts - this medicine may decrease the number of white blood cells, red blood cells and platelets. You may be at increased risk for infections and bleeding. nausea, vomiting pain, swelling, warmth in the leg signs of decreased platelets or bleeding - bruising, pinpoint red spots on the skin, black, tarry stools, blood in the urine signs of infection - fever or chills, cough, sore throat, pain or difficulty passing urine signs of decreased red blood cells - unusually weak or tired, fainting spells, lightheadedness Side effects that usually do not require medical attention (report to  your doctor or health care professional if they continue or are bothersome): constipation hair loss headache loss of appetite mouth sores stomach pain This list may not describe all possible side effects. Call your doctor for medical advice about side effects. You may report side effects to FDA at 1-800-FDA-1088. Where should I keep my medication? This drug is given in a hospital or clinic and will not be stored at home. NOTE: This sheet is a summary. It may not cover all possible information. If you have questions about this medicine, talk to your doctor, pharmacist, or health care provider.  2023 Elsevier/Gold Standard (2020-12-29 00:00:00)  Leucovorin injection What is this medication? LEUCOVORIN (loo koe VOR in) is used to prevent or treat the harmful effects of some medicines. This medicine is used to treat anemia caused by a low amount of folic acid in the body. It is also used with 5-fluorouracil (5-FU) to treat colon cancer. This medicine may be used for other purposes; ask your health care provider or pharmacist if you have questions. What should I tell my care team before I take this medication? They need to know if you have any of these conditions: anemia from low levels of vitamin B-12 in the blood an unusual or allergic reaction to leucovorin, folic acid, other medicines, foods, dyes, or preservatives pregnant or trying to get pregnant breast-feeding How should I use this medication? This medicine is for injection into a muscle or into a vein. It is given by a health care professional in a hospital or clinic setting. Talk to your pediatrician regarding the use of this medicine in children. Special care may be needed. Overdosage: If you think you have taken too much of this medicine contact a poison control center or emergency room at once. NOTE: This medicine is only for you. Do not share this medicine with others. What if I miss a dose? This does not apply. What may  interact with this medication? capecitabine fluorouracil phenobarbital phenytoin primidone trimethoprim-sulfamethoxazole This list may not describe all possible interactions. Give your health care provider a list of all the medicines, herbs, non-prescription drugs, or dietary supplements you use. Also tell them if you smoke, drink alcohol, or use illegal drugs. Some items may interact with your medicine. What should I watch for while using this medication? Your condition will be monitored carefully while you are receiving this medicine. This medicine may increase the side effects of 5-fluorouracil, 5-FU. Tell your doctor or health care professional if you have diarrhea or mouth sores that do not get better or that get worse. What side effects may I notice from receiving this medication? Side effects that you should report to your doctor or health care professional as soon as possible: allergic reactions like skin rash, itching or hives, swelling of the face, lips, or tongue breathing problems fever, infection mouth sores unusual bleeding or bruising unusually weak or tired Side effects that usually do not require medical attention (report to your doctor or health care professional if they continue or are bothersome): constipation or diarrhea loss of appetite nausea, vomiting This list may not describe all possible side effects. Call your doctor for medical advice about side effects. You may report side effects to FDA at 1-800-FDA-1088. Where should I keep my medication? This drug is given in a hospital or clinic and will not be stored at home. NOTE: This sheet is a summary. It may not cover all possible information. If you have questions about this medicine, talk to your doctor, pharmacist, or health care provider.  2023 Elsevier/Gold Standard (2007-08-06 00:00:00)  Fluorouracil, 5-FU injection What is this medication? FLUOROURACIL, 5-FU (flure oh YOOR a sil) is a chemotherapy drug.  It slows the growth of cancer cells. This medicine is used to treat many types of cancer like breast cancer, colon or rectal cancer, pancreatic cancer, and stomach cancer. This medicine may be used for other purposes; ask your health care provider or pharmacist if you have questions. COMMON BRAND NAME(S): Adrucil What should I tell my care team before I take this medication? They need to know if  you have any of these conditions: blood disorders dihydropyrimidine dehydrogenase (DPD) deficiency infection (especially a virus infection such as chickenpox, cold sores, or herpes) kidney disease liver disease malnourished, poor nutrition recent or ongoing radiation therapy an unusual or allergic reaction to fluorouracil, other chemotherapy, other medicines, foods, dyes, or preservatives pregnant or trying to get pregnant breast-feeding How should I use this medication? This drug is given as an infusion or injection into a vein. It is administered in a hospital or clinic by a specially trained health care professional. Talk to your pediatrician regarding the use of this medicine in children. Special care may be needed. Overdosage: If you think you have taken too much of this medicine contact a poison control center or emergency room at once. NOTE: This medicine is only for you. Do not share this medicine with others. What if I miss a dose? It is important not to miss your dose. Call your doctor or health care professional if you are unable to keep an appointment. What may interact with this medication? Do not take this medicine with any of the following medications: live virus vaccines This medicine may also interact with the following medications: medicines that treat or prevent blood clots like warfarin, enoxaparin, and dalteparin This list may not describe all possible interactions. Give your health care provider a list of all the medicines, herbs, non-prescription drugs, or dietary supplements  you use. Also tell them if you smoke, drink alcohol, or use illegal drugs. Some items may interact with your medicine. What should I watch for while using this medication? Visit your doctor for checks on your progress. This drug may make you feel generally unwell. This is not uncommon, as chemotherapy can affect healthy cells as well as cancer cells. Report any side effects. Continue your course of treatment even though you feel ill unless your doctor tells you to stop. In some cases, you may be given additional medicines to help with side effects. Follow all directions for their use. Call your doctor or health care professional for advice if you get a fever, chills or sore throat, or other symptoms of a cold or flu. Do not treat yourself. This drug decreases your body's ability to fight infections. Try to avoid being around people who are sick. This medicine may increase your risk to bruise or bleed. Call your doctor or health care professional if you notice any unusual bleeding. Be careful brushing and flossing your teeth or using a toothpick because you may get an infection or bleed more easily. If you have any dental work done, tell your dentist you are receiving this medicine. Avoid taking products that contain aspirin, acetaminophen, ibuprofen, naproxen, or ketoprofen unless instructed by your doctor. These medicines may hide a fever. Do not become pregnant while taking this medicine. Women should inform their doctor if they wish to become pregnant or think they might be pregnant. There is a potential for serious side effects to an unborn child. Talk to your health care professional or pharmacist for more information. Do not breast-feed an infant while taking this medicine. Men should inform their doctor if they wish to father a child. This medicine may lower sperm counts. Do not treat diarrhea with over the counter products. Contact your doctor if you have diarrhea that lasts more than 2 days or if  it is severe and watery. This medicine can make you more sensitive to the sun. Keep out of the sun. If you cannot avoid being in the sun, wear  protective clothing and use sunscreen. Do not use sun lamps or tanning beds/booths. What side effects may I notice from receiving this medication? Side effects that you should report to your doctor or health care professional as soon as possible: allergic reactions like skin rash, itching or hives, swelling of the face, lips, or tongue low blood counts - this medicine may decrease the number of white blood cells, red blood cells and platelets. You may be at increased risk for infections and bleeding. signs of infection - fever or chills, cough, sore throat, pain or difficulty passing urine signs of decreased platelets or bleeding - bruising, pinpoint red spots on the skin, black, tarry stools, blood in the urine signs of decreased red blood cells - unusually weak or tired, fainting spells, lightheadedness breathing problems changes in vision chest pain mouth sores nausea and vomiting pain, swelling, redness at site where injected pain, tingling, numbness in the hands or feet redness, swelling, or sores on hands or feet stomach pain unusual bleeding Side effects that usually do not require medical attention (report to your doctor or health care professional if they continue or are bothersome): changes in finger or toe nails diarrhea dry or itchy skin hair loss headache loss of appetite sensitivity of eyes to the light stomach upset unusually teary eyes This list may not describe all possible side effects. Call your doctor for medical advice about side effects. You may report side effects to FDA at 1-800-FDA-1088. Where should I keep my medication? This drug is given in a hospital or clinic and will not be stored at home. NOTE: This sheet is a summary. It may not cover all possible information. If you have questions about this medicine, talk to  your doctor, pharmacist, or health care provider.  2023 [employer] (2020-12-29 00:00:00)

## 2021-10-17 NOTE — Patient Instructions (Signed)

## 2021-10-17 NOTE — Progress Notes (Signed)
Patient seen by Dr. Sherrill today ? ?Vitals are within treatment parameters. ? ?Labs reviewed by Dr. Sherrill and are within treatment parameters. ? ?Per physician team, patient is ready for treatment and there are NO modifications to the treatment plan.  ?

## 2021-10-18 ENCOUNTER — Other Ambulatory Visit: Payer: Self-pay

## 2021-10-18 LAB — CANCER ANTIGEN 19-9: CA 19-9: 1535 U/mL — ABNORMAL HIGH (ref 0–35)

## 2021-10-19 ENCOUNTER — Inpatient Hospital Stay: Payer: BC Managed Care – PPO

## 2021-10-19 VITALS — BP 112/72 | HR 70 | Temp 97.6°F | Resp 18

## 2021-10-19 DIAGNOSIS — C251 Malignant neoplasm of body of pancreas: Secondary | ICD-10-CM | POA: Diagnosis not present

## 2021-10-19 DIAGNOSIS — R110 Nausea: Secondary | ICD-10-CM

## 2021-10-19 MED ORDER — PEGFILGRASTIM-CBQV 6 MG/0.6ML ~~LOC~~ SOSY
6.0000 mg | PREFILLED_SYRINGE | Freq: Once | SUBCUTANEOUS | Status: AC
  Administered 2021-10-19: 6 mg via SUBCUTANEOUS
  Filled 2021-10-19: qty 0.6

## 2021-10-19 MED ORDER — SODIUM CHLORIDE 0.9 % IV SOLN: Freq: Once | INTRAVENOUS | Status: AC

## 2021-10-19 MED ORDER — SODIUM CHLORIDE 0.9% FLUSH
10.0000 mL | INTRAVENOUS | Status: DC | PRN
  Administered 2021-10-19: 10 mL

## 2021-10-19 MED ORDER — HEPARIN SOD (PORK) LOCK FLUSH 100 UNIT/ML IV SOLN
500.0000 [IU] | Freq: Once | INTRAVENOUS | Status: AC | PRN
  Administered 2021-10-19: 500 [IU]

## 2021-10-19 NOTE — Patient Instructions (Signed)

## 2021-10-25 ENCOUNTER — Other Ambulatory Visit: Payer: Self-pay | Admitting: Nurse Practitioner

## 2021-10-25 DIAGNOSIS — C251 Malignant neoplasm of body of pancreas: Secondary | ICD-10-CM

## 2021-10-25 MED ORDER — TRAMADOL HCL 50 MG PO TABS: 50.0000 mg | ORAL_TABLET | Freq: Four times a day (QID) | ORAL | 0 refills | Status: DC | PRN

## 2021-10-31 ENCOUNTER — Inpatient Hospital Stay: Payer: BC Managed Care – PPO

## 2021-10-31 ENCOUNTER — Other Ambulatory Visit: Payer: Self-pay | Admitting: *Deleted

## 2021-10-31 ENCOUNTER — Inpatient Hospital Stay (HOSPITAL_BASED_OUTPATIENT_CLINIC_OR_DEPARTMENT_OTHER): Payer: BC Managed Care – PPO | Admitting: Nurse Practitioner

## 2021-10-31 ENCOUNTER — Encounter: Payer: Self-pay | Admitting: Nurse Practitioner

## 2021-10-31 VITALS — BP 117/65 | HR 75 | Temp 97.9°F | Resp 18 | Ht 70.0 in | Wt 110.0 lb

## 2021-10-31 DIAGNOSIS — C251 Malignant neoplasm of body of pancreas: Secondary | ICD-10-CM

## 2021-10-31 LAB — CMP (CANCER CENTER ONLY)
ALT: 23 U/L (ref 0–44)
AST: 22 U/L (ref 15–41)
Albumin: 3.9 g/dL (ref 3.5–5.0)
Alkaline Phosphatase: 97 U/L (ref 38–126)
Anion gap: 8 (ref 5–15)
BUN: 20 mg/dL (ref 6–20)
CO2: 27 mmol/L (ref 22–32)
Calcium: 8.8 mg/dL — ABNORMAL LOW (ref 8.9–10.3)
Chloride: 107 mmol/L (ref 98–111)
Creatinine: 0.59 mg/dL (ref 0.44–1.00)
GFR, Estimated: >60 mL/min (ref >60)
Glucose, Bld: 112 mg/dL — ABNORMAL HIGH (ref 70–99)
Potassium: 3.7 mmol/L (ref 3.5–5.1)
Sodium: 142 mmol/L (ref 135–145)
Total Bilirubin: 0.2 mg/dL — ABNORMAL LOW (ref 0.3–1.2)
Total Protein: 6.4 g/dL — ABNORMAL LOW (ref 6.5–8.1)

## 2021-10-31 LAB — CBC WITH DIFFERENTIAL (CANCER CENTER ONLY)
Abs Immature Granulocytes: 0.43 10*3/uL — ABNORMAL HIGH (ref 0.00–0.07)
Basophils Absolute: 0.1 10*3/uL (ref 0.0–0.1)
Basophils Relative: 1 %
Eosinophils Absolute: 0.4 10*3/uL (ref 0.0–0.5)
Eosinophils Relative: 3 %
HCT: 30.2 % — ABNORMAL LOW (ref 36.0–46.0)
Hemoglobin: 9.8 g/dL — ABNORMAL LOW (ref 12.0–15.0)
Immature Granulocytes: 4 %
Lymphocytes Relative: 16 %
Lymphs Abs: 1.6 10*3/uL (ref 0.7–4.0)
MCH: 32.1 pg (ref 26.0–34.0)
MCHC: 32.5 g/dL (ref 30.0–36.0)
MCV: 99 fL (ref 80.0–100.0)
Monocytes Absolute: 0.7 10*3/uL (ref 0.1–1.0)
Monocytes Relative: 7 %
Neutro Abs: 7.1 10*3/uL (ref 1.7–7.7)
Neutrophils Relative %: 69 %
Platelet Count: 137 10*3/uL — ABNORMAL LOW (ref 150–400)
RBC: 3.05 MIL/uL — ABNORMAL LOW (ref 3.87–5.11)
RDW: 17.8 % — ABNORMAL HIGH (ref 11.5–15.5)
WBC Count: 10.2 10*3/uL (ref 4.0–10.5)
nRBC: 0 % (ref 0.0–0.2)

## 2021-10-31 MED ORDER — ATROPINE SULFATE 1 MG/ML IV SOLN
0.5000 mg | Freq: Once | INTRAVENOUS | Status: AC | PRN
  Administered 2021-10-31: 0.5 mg via INTRAVENOUS
  Filled 2021-10-31: qty 1

## 2021-10-31 MED ORDER — SODIUM CHLORIDE 0.9 % IV SOLN
400.0000 mg/m2 | Freq: Once | INTRAVENOUS | Status: AC
  Administered 2021-10-31: 624 mg via INTRAVENOUS
  Filled 2021-10-31: qty 31.2

## 2021-10-31 MED ORDER — SODIUM CHLORIDE 0.9 % IV SOLN
150.0000 mg/m2 | Freq: Once | INTRAVENOUS | Status: AC
  Administered 2021-10-31: 240 mg via INTRAVENOUS
  Filled 2021-10-31: qty 2

## 2021-10-31 MED ORDER — SODIUM CHLORIDE 0.9 % IV SOLN
150.0000 mg | Freq: Once | INTRAVENOUS | Status: AC
  Administered 2021-10-31: 150 mg via INTRAVENOUS
  Filled 2021-10-31: qty 150

## 2021-10-31 MED ORDER — SODIUM CHLORIDE 0.9 % IV SOLN
10.0000 mg | Freq: Once | INTRAVENOUS | Status: AC
  Administered 2021-10-31: 10 mg via INTRAVENOUS
  Filled 2021-10-31: qty 10

## 2021-10-31 MED ORDER — SODIUM CHLORIDE 0.9 % IV SOLN
2400.0000 mg/m2 | INTRAVENOUS | Status: DC
  Administered 2021-10-31: 3750 mg via INTRAVENOUS
  Filled 2021-10-31: qty 75

## 2021-10-31 MED ORDER — DEXTROSE 5 % IV SOLN: Freq: Once | INTRAVENOUS | Status: AC

## 2021-10-31 MED ORDER — OXALIPLATIN CHEMO INJECTION 100 MG/20ML
85.0000 mg/m2 | Freq: Once | INTRAVENOUS | Status: AC
  Administered 2021-10-31: 135 mg via INTRAVENOUS
  Filled 2021-10-31: qty 10

## 2021-10-31 MED ORDER — SODIUM CHLORIDE 0.9% FLUSH
10.0000 mL | INTRAVENOUS | Status: DC | PRN
  Administered 2021-10-31: 10 mL

## 2021-10-31 MED ORDER — PALONOSETRON HCL INJECTION 0.25 MG/5ML
0.2500 mg | Freq: Once | INTRAVENOUS | Status: AC
  Administered 2021-10-31: 0.25 mg via INTRAVENOUS
  Filled 2021-10-31: qty 5

## 2021-10-31 NOTE — Patient Instructions (Signed)

## 2021-10-31 NOTE — Patient Instructions (Addendum)
[facility]  The chemotherapy medication bag should finish at 46 hours, 96 hours, or 7 days. For example, if your pump is scheduled for 46 hours and it was put on at 4:00 p.m., it should finish at 2:00 p.m. the day it is scheduled to come off regardless of your appointment time.     Estimated time to finish at 12:15pm Friday, September 22nd, 2023.   If the display on your pump reads "Low Volume" and it is beeping, take the batteries out of the pump and come to the cancer center for it to be taken off.   If the pump alarms go off prior to the pump reading "Low Volume" then call [phone] and someone can assist you.  If the plunger comes out and the chemotherapy medication is leaking out, please use your home chemo spill kit to clean up the spill. Do NOT use paper towels or other household products.  If you have problems or questions regarding your pump, please call either [phone] (24 hours a day) or the cancer center Monday-Friday 8:00 a.m.- 4:30 p.m. at the clinic number and we will assist you. If you are unable to get assistance, then go to the nearest Emergency Department and ask the staff to contact the IV team for assistance.   Discharge Instructions: Thank you for choosing [facility] to provide your oncology and hematology care.   If you have a lab appointment with the [facility], please go directly to the [facility] and check in at the registration area.   Wear comfortable clothing and clothing appropriate for easy access to any Portacath or PICC line.   We strive to give you quality time with your provider. You may need to reschedule your appointment if you arrive late (15 or more minutes).  Arriving late affects you and other patients whose appointments are after yours.  Also, if you miss three or more appointments without notifying the office, you may be dismissed from the clinic at the provider's discretion.      For  prescription refill requests, have your pharmacy contact our office and allow 72 hours for refills to be completed.    Today you received the following chemotherapy and/or immunotherapy agents Oxaliplatin,Irinotecan, Leucovorin, Fluorouracil      To help prevent nausea and vomiting after your treatment, we encourage you to take your nausea medication as directed.  BELOW ARE SYMPTOMS THAT SHOULD BE REPORTED IMMEDIATELY: *FEVER GREATER THAN 100.4 F (38 C) OR HIGHER *CHILLS OR SWEATING *NAUSEA AND VOMITING THAT IS NOT CONTROLLED WITH YOUR NAUSEA MEDICATION *UNUSUAL SHORTNESS OF BREATH *UNUSUAL BRUISING OR BLEEDING *URINARY PROBLEMS (pain or burning when urinating, or frequent urination) *BOWEL PROBLEMS (unusual diarrhea, constipation, pain near the anus) TENDERNESS IN MOUTH AND THROAT WITH OR WITHOUT PRESENCE OF ULCERS (sore throat, sores in mouth, or a toothache) UNUSUAL RASH, SWELLING OR PAIN  UNUSUAL VAGINAL DISCHARGE OR ITCHING   Items with * indicate a potential emergency and should be followed up as soon as possible or go to the Emergency Department if any problems should occur.  Please show the CHEMOTHERAPY ALERT CARD or IMMUNOTHERAPY ALERT CARD at check-in to the Emergency Department and triage nurse.  Should you have questions after your visit or need to cancel or reschedule your appointment, please contact [facility]  Dept: [phone]  and follow the prompts.  Office hours are 8:00 a.m. to 4:30 p.m. Monday - Friday. Please note that voicemails  left after 4:00 p.m. may not be returned until the following business day.  We are closed weekends and major holidays. You have access to a nurse at all times for urgent questions. Please call the main number to the clinic Dept: [phone] and follow the prompts.   For any non-urgent questions, you may also contact your provider using MyChart. We now offer e-Visits for anyone 90 and older to request care online  for non-urgent symptoms. For details visit [email or web address].   Also download the MyChart app! Go to the app store, search "MyChart", open the app, select [facility], and log in with your MyChart username and password.  Masks are optional in the cancer centers. If you would like for your care team to wear a mask while they are taking care of you, please let them know. For doctor visits, patients may have with them one support person who is at least 54 years old. At this time, visitors are not allowed in the infusion area.  Oxaliplatin Injection What is this medication? OXALIPLATIN (ox AL i PLA tin) is a chemotherapy drug. It targets fast dividing cells, like cancer cells, and causes these cells to die. This medicine is used to treat cancers of the colon and rectum, and many other cancers. This medicine may be used for other purposes; ask your health care provider or pharmacist if you have questions. COMMON BRAND NAME(S): Eloxatin What should I tell my care team before I take this medication? They need to know if you have any of these conditions: heart disease history of irregular heartbeat liver disease low blood counts, like white cells, platelets, or red blood cells lung or breathing disease, like asthma take medicines that treat or prevent blood clots tingling of the fingers or toes, or other nerve disorder an unusual or allergic reaction to oxaliplatin, other chemotherapy, other medicines, foods, dyes, or preservatives pregnant or trying to get pregnant breast-feeding How should I use this medication? This drug is given as an infusion into a vein. It is administered in a hospital or clinic by a specially trained health care professional. Talk to your pediatrician regarding the use of this medicine in children. Special care may be needed. Overdosage: If you think you have taken too much of this medicine contact a poison control center or emergency room at once. NOTE: This medicine  is only for you. Do not share this medicine with others. What if I miss a dose? It is important not to miss a dose. Call your doctor or health care professional if you are unable to keep an appointment. What may interact with this medication? Do not take this medicine with any of the following medications: cisapride dronedarone pimozide thioridazine This medicine may also interact with the following medications: aspirin and aspirin-like medicines certain medicines that treat or prevent blood clots like warfarin, apixaban, dabigatran, and rivaroxaban cisplatin cyclosporine diuretics medicines for infection like acyclovir, adefovir, amphotericin B, bacitracin, cidofovir, foscarnet, ganciclovir, gentamicin, pentamidine, vancomycin NSAIDs, medicines for pain and inflammation, like ibuprofen or naproxen other medicines that prolong the QT interval (an abnormal heart rhythm) pamidronate zoledronic acid This list may not describe all possible interactions. Give your health care provider a list of all the medicines, herbs, non-prescription drugs, or dietary supplements you use. Also tell them if you smoke, drink alcohol, or use illegal drugs. Some items may interact with your medicine. What should I watch for while using this medication? Your condition will be monitored carefully while you are receiving this medicine.  You may need blood work done while you are taking this medicine. This medicine may make you feel generally unwell. This is not uncommon as chemotherapy can affect healthy cells as well as cancer cells. Report any side effects. Continue your course of treatment even though you feel ill unless your healthcare professional tells you to stop. This medicine can make you more sensitive to cold. Do not drink cold drinks or use ice. Cover exposed skin before coming in contact with cold temperatures or cold objects. When out in cold weather wear warm clothing and cover your mouth and nose to  warm the air that goes into your lungs. Tell your doctor if you get sensitive to the cold. Do not become pregnant while taking this medicine or for 9 months after stopping it. Women should inform their health care professional if they wish to become pregnant or think they might be pregnant. Men should not father a child while taking this medicine and for 6 months after stopping it. There is potential for serious side effects to an unborn child. Talk to your health care professional for more information. Do not breast-feed a child while taking this medicine or for 3 months after stopping it. This medicine has caused ovarian failure in some women. This medicine may make it more difficult to get pregnant. Talk to your health care professional if you are concerned about your fertility. This medicine has caused decreased sperm counts in some men. This may make it more difficult to father a child. Talk to your health care professional if you are concerned about your fertility. This medicine may increase your risk of getting an infection. Call your health care professional for advice if you get a fever, chills, or sore throat, or other symptoms of a cold or flu. Do not treat yourself. Try to avoid being around people who are sick. Avoid taking medicines that contain aspirin, acetaminophen, ibuprofen, naproxen, or ketoprofen unless instructed by your health care professional. These medicines may hide a fever. Be careful brushing or flossing your teeth or using a toothpick because you may get an infection or bleed more easily. If you have any dental work done, tell your dentist you are receiving this medicine. What side effects may I notice from receiving this medication? Side effects that you should report to your doctor or health care professional as soon as possible: allergic reactions like skin rash, itching or hives, swelling of the face, lips, or tongue breathing problems cough low blood counts - this  medicine may decrease the number of white blood cells, red blood cells, and platelets. You may be at increased risk for infections and bleeding nausea, vomiting pain, redness, or irritation at site where injected pain, tingling, numbness in the hands or feet signs and symptoms of bleeding such as bloody or black, tarry stools; red or dark brown urine; spitting up blood or brown material that looks like coffee grounds; red spots on the skin; unusual bruising or bleeding from the eyes, gums, or nose signs and symptoms of a dangerous change in heartbeat or heart rhythm like chest pain; dizziness; fast, irregular heartbeat; palpitations; feeling faint or lightheaded; falls signs and symptoms of infection like fever; chills; cough; sore throat; pain or trouble passing urine signs and symptoms of liver injury like dark yellow or brown urine; general ill feeling or flu-like symptoms; light-colored stools; loss of appetite; nausea; right upper belly pain; unusually weak or tired; yellowing of the eyes or skin signs and symptoms of low  red blood cells or anemia such as unusually weak or tired; feeling faint or lightheaded; falls signs and symptoms of muscle injury like dark urine; trouble passing urine or change in the amount of urine; unusually weak or tired; muscle pain; back pain Side effects that usually do not require medical attention (report to your doctor or health care professional if they continue or are bothersome): changes in taste diarrhea gas hair loss loss of appetite mouth sores This list may not describe all possible side effects. Call your doctor for medical advice about side effects. You may report side effects to FDA at 1-800-FDA-1088. Where should I keep my medication? This drug is given in a hospital or clinic and will not be stored at home. NOTE: This sheet is a summary. It may not cover all possible information. If you have questions about this medicine, talk to your doctor,  pharmacist, or health care provider.  2023 Elsevier/Gold Standard (2020-12-29 00:00:00)  Irinotecan injection What is this medication? IRINOTECAN (ir in oh TEE kan ) is a chemotherapy drug. It is used to treat colon and rectal cancer. This medicine may be used for other purposes; ask your health care provider or pharmacist if you have questions. COMMON BRAND NAME(S): Camptosar What should I tell my care team before I take this medication? They need to know if you have any of these conditions: dehydration diarrhea infection (especially a virus infection such as chickenpox, cold sores, or herpes) liver disease low blood counts, like low white cell, platelet, or red cell counts low levels of calcium, magnesium, or potassium in the blood recent or ongoing radiation therapy an unusual or allergic reaction to irinotecan, other medicines, foods, dyes, or preservatives pregnant or trying to get pregnant breast-feeding How should I use this medication? This drug is given as an infusion into a vein. It is administered in a hospital or clinic by a specially trained health care professional. Talk to your pediatrician regarding the use of this medicine in children. Special care may be needed. Overdosage: If you think you have taken too much of this medicine contact a poison control center or emergency room at once. NOTE: This medicine is only for you. Do not share this medicine with others. What if I miss a dose? It is important not to miss your dose. Call your doctor or health care professional if you are unable to keep an appointment. What may interact with this medication? Do not take this medicine with any of the following medications: cobicistat itraconazole This medicine may interact with the following medications: antiviral medicines for HIV or AIDS certain antibiotics like rifampin or rifabutin certain medicines for fungal infections like ketoconazole, posaconazole, and  voriconazole certain medicines for seizures like carbamazepine, phenobarbital, phenotoin clarithromycin gemfibrozil nefazodone [facility]. John's Wort This list may not describe all possible interactions. Give your health care provider a list of all the medicines, herbs, non-prescription drugs, or dietary supplements you use. Also tell them if you smoke, drink alcohol, or use illegal drugs. Some items may interact with your medicine. What should I watch for while using this medication? Your condition will be monitored carefully while you are receiving this medicine. You will need important blood work done while you are taking this medicine. This drug may make you feel generally unwell. This is not uncommon, as chemotherapy can affect healthy cells as well as cancer cells. Report any side effects. Continue your course of treatment even though you feel ill unless your doctor tells you to stop.  In some cases, you may be given additional medicines to help with side effects. Follow all directions for their use. You may get drowsy or dizzy. Do not drive, use machinery, or do anything that needs mental alertness until you know how this medicine affects you. Do not stand or sit up quickly, especially if you are an older patient. This reduces the risk of dizzy or fainting spells. Call your health care professional for advice if you get a fever, chills, or sore throat, or other symptoms of a cold or flu. Do not treat yourself. This medicine decreases your body's ability to fight infections. Try to avoid being around people who are sick. Avoid taking products that contain aspirin, acetaminophen, ibuprofen, naproxen, or ketoprofen unless instructed by your doctor. These medicines may hide a fever. This medicine may increase your risk to bruise or bleed. Call your doctor or health care professional if you notice any unusual bleeding. Be careful brushing and flossing your teeth or using a toothpick because you may get an  infection or bleed more easily. If you have any dental work done, tell your dentist you are receiving this medicine. Do not become pregnant while taking this medicine or for 6 months after stopping it. Women should inform their health care professional if they wish to become pregnant or think they might be pregnant. Men should not father a child while taking this medicine and for 3 months after stopping it. There is potential for serious side effects to an unborn child. Talk to your health care professional for more information. Do not breast-feed an infant while taking this medicine or for 7 days after stopping it. This medicine has caused ovarian failure in some women. This medicine may make it more difficult to get pregnant. Talk to your health care professional if you are concerned about your fertility. This medicine has caused decreased sperm counts in some men. This may make it more difficult to father a child. Talk to your health care professional if you are concerned about your fertility. What side effects may I notice from receiving this medication? Side effects that you should report to your doctor or health care professional as soon as possible: allergic reactions like skin rash, itching or hives, swelling of the face, lips, or tongue chest pain diarrhea flushing, runny nose, sweating during infusion low blood counts - this medicine may decrease the number of white blood cells, red blood cells and platelets. You may be at increased risk for infections and bleeding. nausea, vomiting pain, swelling, warmth in the leg signs of decreased platelets or bleeding - bruising, pinpoint red spots on the skin, black, tarry stools, blood in the urine signs of infection - fever or chills, cough, sore throat, pain or difficulty passing urine signs of decreased red blood cells - unusually weak or tired, fainting spells, lightheadedness Side effects that usually do not require medical attention (report to  your doctor or health care professional if they continue or are bothersome): constipation hair loss headache loss of appetite mouth sores stomach pain This list may not describe all possible side effects. Call your doctor for medical advice about side effects. You may report side effects to FDA at 1-800-FDA-1088. Where should I keep my medication? This drug is given in a hospital or clinic and will not be stored at home. NOTE: This sheet is a summary. It may not cover all possible information. If you have questions about this medicine, talk to your doctor, pharmacist, or health care provider.  2023 Elsevier/Gold Standard (2020-12-29 00:00:00)  Leucovorin injection What is this medication? LEUCOVORIN (loo koe VOR in) is used to prevent or treat the harmful effects of some medicines. This medicine is used to treat anemia caused by a low amount of folic acid in the body. It is also used with 5-fluorouracil (5-FU) to treat colon cancer. This medicine may be used for other purposes; ask your health care provider or pharmacist if you have questions. What should I tell my care team before I take this medication? They need to know if you have any of these conditions: anemia from low levels of vitamin B-12 in the blood an unusual or allergic reaction to leucovorin, folic acid, other medicines, foods, dyes, or preservatives pregnant or trying to get pregnant breast-feeding How should I use this medication? This medicine is for injection into a muscle or into a vein. It is given by a health care professional in a hospital or clinic setting. Talk to your pediatrician regarding the use of this medicine in children. Special care may be needed. Overdosage: If you think you have taken too much of this medicine contact a poison control center or emergency room at once. NOTE: This medicine is only for you. Do not share this medicine with others. What if I miss a dose? This does not apply. What may  interact with this medication? capecitabine fluorouracil phenobarbital phenytoin primidone trimethoprim-sulfamethoxazole This list may not describe all possible interactions. Give your health care provider a list of all the medicines, herbs, non-prescription drugs, or dietary supplements you use. Also tell them if you smoke, drink alcohol, or use illegal drugs. Some items may interact with your medicine. What should I watch for while using this medication? Your condition will be monitored carefully while you are receiving this medicine. This medicine may increase the side effects of 5-fluorouracil, 5-FU. Tell your doctor or health care professional if you have diarrhea or mouth sores that do not get better or that get worse. What side effects may I notice from receiving this medication? Side effects that you should report to your doctor or health care professional as soon as possible: allergic reactions like skin rash, itching or hives, swelling of the face, lips, or tongue breathing problems fever, infection mouth sores unusual bleeding or bruising unusually weak or tired Side effects that usually do not require medical attention (report to your doctor or health care professional if they continue or are bothersome): constipation or diarrhea loss of appetite nausea, vomiting This list may not describe all possible side effects. Call your doctor for medical advice about side effects. You may report side effects to FDA at 1-800-FDA-1088. Where should I keep my medication? This drug is given in a hospital or clinic and will not be stored at home. NOTE: This sheet is a summary. It may not cover all possible information. If you have questions about this medicine, talk to your doctor, pharmacist, or health care provider.  2023 Elsevier/Gold Standard (2007-08-06 00:00:00)  Fluorouracil, 5-FU injection What is this medication? FLUOROURACIL, 5-FU (flure oh YOOR a sil) is a chemotherapy drug.  It slows the growth of cancer cells. This medicine is used to treat many types of cancer like breast cancer, colon or rectal cancer, pancreatic cancer, and stomach cancer. This medicine may be used for other purposes; ask your health care provider or pharmacist if you have questions. COMMON BRAND NAME(S): Adrucil What should I tell my care team before I take this medication? They need to know if  you have any of these conditions: blood disorders dihydropyrimidine dehydrogenase (DPD) deficiency infection (especially a virus infection such as chickenpox, cold sores, or herpes) kidney disease liver disease malnourished, poor nutrition recent or ongoing radiation therapy an unusual or allergic reaction to fluorouracil, other chemotherapy, other medicines, foods, dyes, or preservatives pregnant or trying to get pregnant breast-feeding How should I use this medication? This drug is given as an infusion or injection into a vein. It is administered in a hospital or clinic by a specially trained health care professional. Talk to your pediatrician regarding the use of this medicine in children. Special care may be needed. Overdosage: If you think you have taken too much of this medicine contact a poison control center or emergency room at once. NOTE: This medicine is only for you. Do not share this medicine with others. What if I miss a dose? It is important not to miss your dose. Call your doctor or health care professional if you are unable to keep an appointment. What may interact with this medication? Do not take this medicine with any of the following medications: live virus vaccines This medicine may also interact with the following medications: medicines that treat or prevent blood clots like warfarin, enoxaparin, and dalteparin This list may not describe all possible interactions. Give your health care provider a list of all the medicines, herbs, non-prescription drugs, or dietary supplements  you use. Also tell them if you smoke, drink alcohol, or use illegal drugs. Some items may interact with your medicine. What should I watch for while using this medication? Visit your doctor for checks on your progress. This drug may make you feel generally unwell. This is not uncommon, as chemotherapy can affect healthy cells as well as cancer cells. Report any side effects. Continue your course of treatment even though you feel ill unless your doctor tells you to stop. In some cases, you may be given additional medicines to help with side effects. Follow all directions for their use. Call your doctor or health care professional for advice if you get a fever, chills or sore throat, or other symptoms of a cold or flu. Do not treat yourself. This drug decreases your body's ability to fight infections. Try to avoid being around people who are sick. This medicine may increase your risk to bruise or bleed. Call your doctor or health care professional if you notice any unusual bleeding. Be careful brushing and flossing your teeth or using a toothpick because you may get an infection or bleed more easily. If you have any dental work done, tell your dentist you are receiving this medicine. Avoid taking products that contain aspirin, acetaminophen, ibuprofen, naproxen, or ketoprofen unless instructed by your doctor. These medicines may hide a fever. Do not become pregnant while taking this medicine. Women should inform their doctor if they wish to become pregnant or think they might be pregnant. There is a potential for serious side effects to an unborn child. Talk to your health care professional or pharmacist for more information. Do not breast-feed an infant while taking this medicine. Men should inform their doctor if they wish to father a child. This medicine may lower sperm counts. Do not treat diarrhea with over the counter products. Contact your doctor if you have diarrhea that lasts more than 2 days or if  it is severe and watery. This medicine can make you more sensitive to the sun. Keep out of the sun. If you cannot avoid being in the sun, wear  protective clothing and use sunscreen. Do not use sun lamps or tanning beds/booths. What side effects may I notice from receiving this medication? Side effects that you should report to your doctor or health care professional as soon as possible: allergic reactions like skin rash, itching or hives, swelling of the face, lips, or tongue low blood counts - this medicine may decrease the number of white blood cells, red blood cells and platelets. You may be at increased risk for infections and bleeding. signs of infection - fever or chills, cough, sore throat, pain or difficulty passing urine signs of decreased platelets or bleeding - bruising, pinpoint red spots on the skin, black, tarry stools, blood in the urine signs of decreased red blood cells - unusually weak or tired, fainting spells, lightheadedness breathing problems changes in vision chest pain mouth sores nausea and vomiting pain, swelling, redness at site where injected pain, tingling, numbness in the hands or feet redness, swelling, or sores on hands or feet stomach pain unusual bleeding Side effects that usually do not require medical attention (report to your doctor or health care professional if they continue or are bothersome): changes in finger or toe nails diarrhea dry or itchy skin hair loss headache loss of appetite sensitivity of eyes to the light stomach upset unusually teary eyes This list may not describe all possible side effects. Call your doctor for medical advice about side effects. You may report side effects to FDA at 1-800-FDA-1088. Where should I keep my medication? This drug is given in a hospital or clinic and will not be stored at home. NOTE: This sheet is a summary. It may not cover all possible information. If you have questions about this medicine, talk to  your doctor, pharmacist, or health care provider.  2023 Elsevier/Gold Standard (2020-12-29 00:00:00)

## 2021-10-31 NOTE — Progress Notes (Signed)
[facility] OFFICE PROGRESS NOTE   Diagnosis:  Pancreas cancer  INTERVAL HISTORY:   Ms: Tara Jensen returns as scheduled. She completed another cycle of FOLFIRINOX 10/17/2021.  She experienced the same nausea she typically has following treatment.  She notes an alteration in taste for several days after chemotherapy.  No mouth sores.  No significant diarrhea.  Mild intermittent cold sensitivity.  No persistent neuropathy symptoms.  Objective:  Vital signs in last 24 hours:  Blood pressure 117/65, pulse 75, temperature 97.9 F (36.6 C), temperature source Oral, resp. rate 18, height 5' 10" (1.778 m), weight 110 lb (49.9 kg), last menstrual period 11/01/2018, SpO2 100 %.    HEENT: No thrush or ulcers. Resp: Lungs clear bilaterally. Cardio: Regular rate and rhythm. GI: No hepatomegaly.  No mass. Vascular: No leg edema. Neuro: Alert and oriented.  Mild decrease in vibratory sense over the fingertips per tuning fork exam. Skin: Palms without erythema. Port-A-Cath without erythema.   Lab Results:  Lab Results  Component Value Date   WBC 10.2 10/31/2021   HGB 9.8 (L) 10/31/2021   HCT 30.2 (L) 10/31/2021   MCV 99.0 10/31/2021   PLT 137 (L) 10/31/2021   NEUTROABS 7.1 10/31/2021    Imaging:  No results found.  Medications: I have reviewed the patient's current medications.  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  Less than 180 degree abutment of the distal celiac, common hepatic, and splenic arteries, less than 180 degree abutment of the SMA CT chest 07/18/2021-no evidence of metastatic disease EUS 07/16/2021-extrinsic impression on the stomach at the posterior wall of the gastric body, no gross lesion in the duodenum, 45 x 36 mm pancreas body mass,  abutment of the SMA, celiac trunk, and compression of the splenoportal confluence.  No malignant appearing lymph nodes, numerous venous collaterals adjacent to the portal vein, FNA biopsy-malignant cells consistent with adenocarcinoma, T4N0 by EUS Markedly elevated CA 19-9 [employer] 07/31/2021-K-ras G12R, MSI high-not detected, ATM VUS Cycle 1 FOLFOX 08/08/2021 Cycle 2 FOLFIRINOX 08/22/2021 CT abdomen/pelvis at [facility] 08/31/2021-hypoattenuating liver lesions consistent with metastases, mass in the pancreas neck/body with greater than 100 degree encasement of the celiac axis and common hepatic artery.  Less than 180 degree abutment of the SMA, long segment occlusion of the portal splenic confluence, splenic vein occluded, multiple collateral vessels in the upper abdomen, prominent left periaortic node Cycle 3 FOLFIRINOX 09/05/2021 Cycle 4 FOLFIRINOX 09/19/2021 Cycle 5 FOLFIRINOX 10/03/2021 CTs 10/16/2021-decrease size of primary pancreas mass, hepatic metastases, and a suspicious retroperitoneal node, resolution of left supraclavicular/low jugular adenopathy compared to a CT chest 07/18/2021.  No evidence of disease progression. Cycle 6 FOLFIRINOX 10/17/2021 Cycle 7 FOLFIRINOX 10/31/2021 Pain secondary to #1-controlled with tramadol and Tylenol. Weight loss secondary to #1 History of situational depression Family history of breast cancer    Disposition: [name] appears stable.  She has completed 6 cycles of FOLFIRINOX.  There is no clinical evidence of disease progression.  Most recent CA 19-9 tumor marker was further improved.  Plan to proceed with cycle 7 today as scheduled.  CBC and chemistry panel reviewed.  Labs adequate to proceed as above.  She plans to contact the [facility] regarding clinical trials.  She will return for lab, follow-up, chemotherapy in 2 weeks.  We are available to see her sooner if needed.  Patient seen with Dr. Benay Spice.    Ned Card ANP/GNP-BC   10/31/2021  9:27 AM This  was a shared visit with Ned Card.  [name] continues to have an improved performance status since beginning FOLFIRINOX.  She will complete cycle 7 today.  We will follow-up on the CA 19-9.  I discussed [facility] immunotherapy trials with Dr. Kateri Plummer.  Ms. Longo appears to be a candidate for a T-cell trial at the [place].  She understands this would involve a biopsy of a metastatic site in order to obtain tissue for lymphocyte harvesting/preparation.  I recommended she contact Dr. Kateri Plummer to schedule an appointment.  Julieanne Manson, MD

## 2021-10-31 NOTE — Progress Notes (Signed)
Patient seen by Lisa Thomas NP today  Vitals are within treatment parameters.  Labs reviewed by Lisa Thomas NP CBC diff reviewed and within treatment parameters, CMP pending.  Per physician team, patient is ready for treatment and there are NO modifications to the treatment plan.  

## 2021-11-01 ENCOUNTER — Encounter: Payer: Self-pay | Admitting: Oncology

## 2021-11-01 ENCOUNTER — Other Ambulatory Visit: Payer: Self-pay

## 2021-11-01 ENCOUNTER — Telehealth: Payer: Self-pay

## 2021-11-01 NOTE — Telephone Encounter (Signed)
CSW attempted to contact patient and left a vm to offer a session on stress reduction per the request of the Nurse Navigator.

## 2021-11-02 ENCOUNTER — Encounter: Payer: Self-pay | Admitting: Oncology

## 2021-11-02 ENCOUNTER — Inpatient Hospital Stay: Payer: BC Managed Care – PPO

## 2021-11-02 VITALS — BP 109/66 | HR 64 | Temp 96.5°F | Resp 18

## 2021-11-02 DIAGNOSIS — C251 Malignant neoplasm of body of pancreas: Secondary | ICD-10-CM

## 2021-11-02 LAB — CANCER ANTIGEN 19-9: CA 19-9: 851 U/mL — ABNORMAL HIGH (ref 0–35)

## 2021-11-02 MED ORDER — HEPARIN SOD (PORK) LOCK FLUSH 100 UNIT/ML IV SOLN
500.0000 [IU] | Freq: Once | INTRAVENOUS | Status: AC | PRN
  Administered 2021-11-02: 500 [IU]

## 2021-11-02 MED ORDER — PEGFILGRASTIM-CBQV 6 MG/0.6ML ~~LOC~~ SOSY
6.0000 mg | PREFILLED_SYRINGE | Freq: Once | SUBCUTANEOUS | Status: AC
  Administered 2021-11-02: 6 mg via SUBCUTANEOUS

## 2021-11-02 MED ORDER — SODIUM CHLORIDE 0.9 % IV SOLN: INTRAVENOUS | Status: AC

## 2021-11-02 MED ORDER — SODIUM CHLORIDE 0.9% FLUSH
10.0000 mL | INTRAVENOUS | Status: DC | PRN
  Administered 2021-11-02: 10 mL

## 2021-11-02 NOTE — Patient Instructions (Signed)
Dehydration, Adult Dehydration is a condition in which there is not enough water or other fluids in the body. This happens when a person loses more fluids than he or she takes in. Important organs, such as the kidneys, brain, and heart, cannot function without a proper amount of fluids. Any loss of fluids from the body can lead to dehydration. Dehydration can be mild, moderate, or severe. It should be treated right away to prevent it from becoming severe. What are the causes? Dehydration may be caused by: Conditions that cause loss of water or other fluids, such as diarrhea, vomiting, or sweating or urinating a lot. Not drinking enough fluids, especially when you are ill or doing activities that require a lot of energy. Other illnesses and conditions, such as fever or infection. Certain medicines, such as medicines that remove excess fluid from the body (diuretics). Lack of safe drinking water. Not being able to get enough water and food. What increases the risk? The following factors may make you more likely to develop this condition: Having a long-term (chronic) illness that has not been treated properly, such as diabetes, heart disease, or kidney disease. Being 65 years of age or older. Having a disability. Living in a place that is high in altitude, where thinner, drier air causes more fluid loss. Doing exercises that put stress on your body for a long time (endurance sports). What are the signs or symptoms? Symptoms of dehydration depend on how severe it is. Mild or moderate dehydration Thirst. Dry lips or dry mouth. Dizziness or light-headedness, especially when standing up from a seated position. Muscle cramps. Dark urine. Urine may be the color of tea. Less urine or tears produced than usual. Headache. Severe dehydration Changes in skin. Your skin may be cold and clammy, blotchy, or pale. Your skin also may not return to normal after being lightly pinched and released. Little or  no tears, urine, or sweat. Changes in vital signs, such as rapid breathing and low blood pressure. Your pulse may be weak or may be faster than 100 beats a minute when you are sitting still. Other changes, such as: Feeling very thirsty. Sunken eyes. Cold hands and feet. Confusion. Being very tired (lethargic) or having trouble waking from sleep. Short-term weight loss. Loss of consciousness. How is this diagnosed? This condition is diagnosed based on your symptoms and a physical exam. You may have blood and urine tests to help confirm the diagnosis. How is this treated? Treatment for this condition depends on how severe it is. Treatment should be started right away. Do not wait until dehydration becomes severe. Severe dehydration is an emergency and needs to be treated in a hospital. Mild or moderate dehydration can be treated at home. You may be asked to: Drink more fluids. Drink an oral rehydration solution (ORS). This drink helps restore proper amounts of fluids and salts and minerals in the blood (electrolytes). Severe dehydration can be treated: With IV fluids. By correcting abnormal levels of electrolytes. This is often done by giving electrolytes through a tube that is passed through your nose and into your stomach (nasogastric tube, or NG tube). By treating the underlying cause of dehydration. Follow these instructions at home: Oral rehydration solution If told by your health care provider, drink an ORS: Make an ORS by following instructions on the package. Start by drinking small amounts, about  cup (120 mL) every 5-10 minutes. Slowly increase how much you drink until you have taken the amount recommended by your health   care provider. Eating and drinking        Drink enough clear fluid to keep your urine pale yellow. If you were told to drink an ORS, finish the ORS first and then start slowly drinking other clear fluids. Drink fluids such as: Water. Do not drink only  water. Doing that can lead to hyponatremia, which is having too little salt (sodium) in the body. Water from ice chips you suck on. Fruit juice that you have added water to (diluted fruit juice). Low-calorie sports drinks. Eat foods that contain a healthy balance of electrolytes, such as bananas, oranges, potatoes, tomatoes, and spinach. Do not drink alcohol. Avoid the following: Drinks that contain a lot of sugar. These include high-calorie sports drinks, fruit juice that is not diluted, and soda. Caffeine. Foods that are greasy or contain a lot of fat or sugar. General instructions Take over-the-counter and prescription medicines only as told by your health care provider. Do not take sodium tablets. Doing that can lead to having too much sodium in the body (hypernatremia). Return to your normal activities as told by your health care provider. Ask your health care provider what activities are safe for you. Keep all follow-up visits as told by your health care provider. This is important. Contact a health care provider if: You have muscle cramps, pain, or discomfort, such as: Pain in your abdomen and the pain gets worse or stays in one area (localizes). Stiff neck. You have a rash. You are more irritable than usual. You are sleepier or have a harder time waking than usual. You feel weak or dizzy. You feel very thirsty. Get help right away if you have: Any symptoms of severe dehydration. Symptoms of vomiting, such as: You cannot eat or drink without vomiting. Vomiting gets worse or does not go away. Vomit includes blood or green matter (bile). Symptoms that get worse with treatment. A fever. A severe headache. Problems with urination or bowel movements, such as: Diarrhea that gets worse or does not go away. Blood in your stool (feces). This may cause stool to look black and tarry. Not urinating, or urinating only a small amount of very dark urine, within 6-8 hours. Trouble  breathing. These symptoms may represent a serious problem that is an emergency. Do not wait to see if the symptoms will go away. Get medical help right away. Call your local emergency services (911 in the [place] Do not drive yourself to the hospital. Summary Dehydration is a condition in which there is not enough water or other fluids in the body. This happens when a person loses more fluids than he or she takes in. Treatment for this condition depends on how severe it is. Treatment should be started right away. Do not wait until dehydration becomes severe. Drink enough clear fluid to keep your urine pale yellow. If you were told to drink an oral rehydration solution (ORS), finish the ORS first and then start slowly drinking other clear fluids. Take over-the-counter and prescription medicines only as told by your health care provider. Get help right away if you have any symptoms of severe dehydration. This information is not intended to replace advice given to you by your health care provider. Make sure you discuss any questions you have with your health care provider. Document Revised: 06/06/2021 Document Reviewed: 09/10/2018 Elsevier Patient Education  2023 [employer]  

## 2021-11-08 ENCOUNTER — Encounter: Payer: Self-pay | Admitting: Oncology

## 2021-11-11 ENCOUNTER — Other Ambulatory Visit: Payer: Self-pay | Admitting: Oncology

## 2021-11-14 ENCOUNTER — Inpatient Hospital Stay: Payer: BC Managed Care – PPO | Attending: Oncology

## 2021-11-14 ENCOUNTER — Inpatient Hospital Stay: Payer: BC Managed Care – PPO

## 2021-11-14 ENCOUNTER — Inpatient Hospital Stay (HOSPITAL_BASED_OUTPATIENT_CLINIC_OR_DEPARTMENT_OTHER): Payer: BC Managed Care – PPO | Admitting: Nurse Practitioner

## 2021-11-14 ENCOUNTER — Other Ambulatory Visit: Payer: Self-pay

## 2021-11-14 ENCOUNTER — Encounter: Payer: Self-pay | Admitting: Nurse Practitioner

## 2021-11-14 DIAGNOSIS — Z803 Family history of malignant neoplasm of breast: Secondary | ICD-10-CM | POA: Insufficient documentation

## 2021-11-14 DIAGNOSIS — Z79899 Other long term (current) drug therapy: Secondary | ICD-10-CM | POA: Insufficient documentation

## 2021-11-14 DIAGNOSIS — C787 Secondary malignant neoplasm of liver and intrahepatic bile duct: Secondary | ICD-10-CM | POA: Diagnosis present

## 2021-11-14 DIAGNOSIS — Z5111 Encounter for antineoplastic chemotherapy: Secondary | ICD-10-CM | POA: Insufficient documentation

## 2021-11-14 DIAGNOSIS — C251 Malignant neoplasm of body of pancreas: Secondary | ICD-10-CM

## 2021-11-14 DIAGNOSIS — G620 Drug-induced polyneuropathy: Secondary | ICD-10-CM | POA: Diagnosis not present

## 2021-11-14 LAB — CBC WITH DIFFERENTIAL (CANCER CENTER ONLY)
Abs Immature Granulocytes: 0.53 10*3/uL — ABNORMAL HIGH (ref 0.00–0.07)
Basophils Absolute: 0 10*3/uL (ref 0.0–0.1)
Basophils Relative: 0 %
Eosinophils Absolute: 0.3 10*3/uL (ref 0.0–0.5)
Eosinophils Relative: 2 %
HCT: 32 % — ABNORMAL LOW (ref 36.0–46.0)
Hemoglobin: 10.2 g/dL — ABNORMAL LOW (ref 12.0–15.0)
Immature Granulocytes: 5 %
Lymphocytes Relative: 15 %
Lymphs Abs: 1.8 10*3/uL (ref 0.7–4.0)
MCH: 31.9 pg (ref 26.0–34.0)
MCHC: 31.9 g/dL (ref 30.0–36.0)
MCV: 100 fL (ref 80.0–100.0)
Monocytes Absolute: 0.8 10*3/uL (ref 0.1–1.0)
Monocytes Relative: 7 %
Neutro Abs: 8.2 10*3/uL — ABNORMAL HIGH (ref 1.7–7.7)
Neutrophils Relative %: 71 %
Platelet Count: 146 10*3/uL — ABNORMAL LOW (ref 150–400)
RBC: 3.2 MIL/uL — ABNORMAL LOW (ref 3.87–5.11)
RDW: 17.2 % — ABNORMAL HIGH (ref 11.5–15.5)
WBC Count: 11.5 10*3/uL — ABNORMAL HIGH (ref 4.0–10.5)
nRBC: 0 % (ref 0.0–0.2)

## 2021-11-14 LAB — CMP (CANCER CENTER ONLY)
ALT: 22 U/L (ref 0–44)
AST: 22 U/L (ref 15–41)
Albumin: 4 g/dL (ref 3.5–5.0)
Alkaline Phosphatase: 117 U/L (ref 38–126)
Anion gap: 8 (ref 5–15)
BUN: 16 mg/dL (ref 6–20)
CO2: 28 mmol/L (ref 22–32)
Calcium: 9.1 mg/dL (ref 8.9–10.3)
Chloride: 106 mmol/L (ref 98–111)
Creatinine: 0.61 mg/dL (ref 0.44–1.00)
GFR, Estimated: >60 mL/min (ref >60)
Glucose, Bld: 109 mg/dL — ABNORMAL HIGH (ref 70–99)
Potassium: 3.7 mmol/L (ref 3.5–5.1)
Sodium: 142 mmol/L (ref 135–145)
Total Bilirubin: 0.2 mg/dL — ABNORMAL LOW (ref 0.3–1.2)
Total Protein: 6.9 g/dL (ref 6.5–8.1)

## 2021-11-14 MED ORDER — SODIUM CHLORIDE 0.9 % IV SOLN
2400.0000 mg/m2 | INTRAVENOUS | Status: DC
  Administered 2021-11-14: 3750 mg via INTRAVENOUS
  Filled 2021-11-14: qty 75

## 2021-11-14 MED ORDER — SODIUM CHLORIDE 0.9 % IV SOLN
400.0000 mg/m2 | Freq: Once | INTRAVENOUS | Status: AC
  Administered 2021-11-14: 624 mg via INTRAVENOUS
  Filled 2021-11-14: qty 31.2

## 2021-11-14 MED ORDER — SODIUM CHLORIDE 0.9 % IV SOLN
10.0000 mg | Freq: Once | INTRAVENOUS | Status: AC
  Administered 2021-11-14: 10 mg via INTRAVENOUS
  Filled 2021-11-14: qty 1

## 2021-11-14 MED ORDER — SODIUM CHLORIDE 0.9 % IV SOLN
150.0000 mg | Freq: Once | INTRAVENOUS | Status: AC
  Administered 2021-11-14: 150 mg via INTRAVENOUS
  Filled 2021-11-14: qty 5

## 2021-11-14 MED ORDER — PALONOSETRON HCL INJECTION 0.25 MG/5ML
0.2500 mg | Freq: Once | INTRAVENOUS | Status: AC
  Administered 2021-11-14: 0.25 mg via INTRAVENOUS
  Filled 2021-11-14: qty 5

## 2021-11-14 MED ORDER — DEXTROSE 5 % IV SOLN: Freq: Once | INTRAVENOUS | Status: AC

## 2021-11-14 MED ORDER — SODIUM CHLORIDE 0.9 % IV SOLN
150.0000 mg/m2 | Freq: Once | INTRAVENOUS | Status: AC
  Administered 2021-11-14: 240 mg via INTRAVENOUS
  Filled 2021-11-14: qty 2

## 2021-11-14 MED ORDER — SODIUM CHLORIDE 0.9% FLUSH
10.0000 mL | INTRAVENOUS | Status: DC | PRN
  Administered 2021-11-14 (×2): 10 mL

## 2021-11-14 MED ORDER — ATROPINE SULFATE 1 MG/ML IV SOLN
0.5000 mg | Freq: Once | INTRAVENOUS | Status: AC | PRN
  Administered 2021-11-14: 0.5 mg via INTRAVENOUS
  Filled 2021-11-14: qty 1

## 2021-11-14 MED ORDER — OXALIPLATIN CHEMO INJECTION 100 MG/20ML
85.0000 mg/m2 | Freq: Once | INTRAVENOUS | Status: AC
  Administered 2021-11-14: 135 mg via INTRAVENOUS
  Filled 2021-11-14: qty 10

## 2021-11-14 NOTE — Patient Instructions (Addendum)
[place]  The chemotherapy medication bag should finish at 46 hours, 96 hours, or 7 days. For example, if your pump is scheduled for 46 hours and it was put on at 4:00 p.m., it should finish at 2:00 p.m. the day it is scheduled to come off regardless of your appointment time.     Estimated time to finish at 12pm Friday, October 6th, 2023.   If the display on your pump reads "Low Volume" and it is beeping, take the batteries out of the pump and come to the cancer center for it to be taken off.   If the pump alarms go off prior to the pump reading "Low Volume" then call [phone] and someone can assist you.  If the plunger comes out and the chemotherapy medication is leaking out, please use your home chemo spill kit to clean up the spill. Do NOT use paper towels or other household products.  If you have problems or questions regarding your pump, please call either [phone] (24 hours a day) or the cancer center Monday-Friday 8:00 a.m.- 4:30 p.m. at the clinic number and we will assist you. If you are unable to get assistance, then go to the nearest Emergency Department and ask the staff to contact the IV team for assistance.   Discharge Instructions: Thank you for choosing [facility] to provide your oncology and hematology care.   If you have a lab appointment with the [facility], please go directly to the [facility] and check in at the registration area.   Wear comfortable clothing and clothing appropriate for easy access to any Portacath or PICC line.   We strive to give you quality time with your provider. You may need to reschedule your appointment if you arrive late (15 or more minutes).  Arriving late affects you and other patients whose appointments are after yours.  Also, if you miss three or more appointments without notifying the office, you may be dismissed from the clinic at the provider's discretion.      For prescription  refill requests, have your pharmacy contact our office and allow 72 hours for refills to be completed.    Today you received the following chemotherapy and/or immunotherapy agents Oxaliplatin,Irinotecan, Leucovorin, Fluorouracil      To help prevent nausea and vomiting after your treatment, we encourage you to take your nausea medication as directed.  BELOW ARE SYMPTOMS THAT SHOULD BE REPORTED IMMEDIATELY: *FEVER GREATER THAN 100.4 F (38 C) OR HIGHER *CHILLS OR SWEATING *NAUSEA AND VOMITING THAT IS NOT CONTROLLED WITH YOUR NAUSEA MEDICATION *UNUSUAL SHORTNESS OF BREATH *UNUSUAL BRUISING OR BLEEDING *URINARY PROBLEMS (pain or burning when urinating, or frequent urination) *BOWEL PROBLEMS (unusual diarrhea, constipation, pain near the anus) TENDERNESS IN MOUTH AND THROAT WITH OR WITHOUT PRESENCE OF ULCERS (sore throat, sores in mouth, or a toothache) UNUSUAL RASH, SWELLING OR PAIN  UNUSUAL VAGINAL DISCHARGE OR ITCHING   Items with * indicate a potential emergency and should be followed up as soon as possible or go to the Emergency Department if any problems should occur.  Please show the CHEMOTHERAPY ALERT CARD or IMMUNOTHERAPY ALERT CARD at check-in to the Emergency Department and triage nurse.  Should you have questions after your visit or need to cancel or reschedule your appointment, please contact [facility]  Dept: [phone]  and follow the prompts.  Office hours are 8:00 a.m. to 4:30 p.m. Monday - Friday. Please note that voicemails  left after 4:00 p.m. may not be returned until the following business day.  We are closed weekends and major holidays. You have access to a nurse at all times for urgent questions. Please call the main number to the clinic Dept: [phone] and follow the prompts.   For any non-urgent questions, you may also contact your provider using MyChart. We now offer e-Visits for anyone 15 and older to request care online for  non-urgent symptoms. For details visit [email or web address].   Also download the MyChart app! Go to the app store, search "MyChart", open the app, select [facility], and log in with your MyChart username and password.  Masks are optional in the cancer centers. If you would like for your care team to wear a mask while they are taking care of you, please let them know. For doctor visits, patients may have with them one support person who is at least 54 years old. At this time, visitors are not allowed in the infusion area.  Oxaliplatin Injection What is this medication? OXALIPLATIN (ox AL i PLA tin) is a chemotherapy drug. It targets fast dividing cells, like cancer cells, and causes these cells to die. This medicine is used to treat cancers of the colon and rectum, and many other cancers. This medicine may be used for other purposes; ask your health care provider or pharmacist if you have questions. COMMON BRAND NAME(S): Eloxatin What should I tell my care team before I take this medication? They need to know if you have any of these conditions: heart disease history of irregular heartbeat liver disease low blood counts, like white cells, platelets, or red blood cells lung or breathing disease, like asthma take medicines that treat or prevent blood clots tingling of the fingers or toes, or other nerve disorder an unusual or allergic reaction to oxaliplatin, other chemotherapy, other medicines, foods, dyes, or preservatives pregnant or trying to get pregnant breast-feeding How should I use this medication? This drug is given as an infusion into a vein. It is administered in a hospital or clinic by a specially trained health care professional. Talk to your pediatrician regarding the use of this medicine in children. Special care may be needed. Overdosage: If you think you have taken too much of this medicine contact a poison control center or emergency room at once. NOTE: This medicine is  only for you. Do not share this medicine with others. What if I miss a dose? It is important not to miss a dose. Call your doctor or health care professional if you are unable to keep an appointment. What may interact with this medication? Do not take this medicine with any of the following medications: cisapride dronedarone pimozide thioridazine This medicine may also interact with the following medications: aspirin and aspirin-like medicines certain medicines that treat or prevent blood clots like warfarin, apixaban, dabigatran, and rivaroxaban cisplatin cyclosporine diuretics medicines for infection like acyclovir, adefovir, amphotericin B, bacitracin, cidofovir, foscarnet, ganciclovir, gentamicin, pentamidine, vancomycin NSAIDs, medicines for pain and inflammation, like ibuprofen or naproxen other medicines that prolong the QT interval (an abnormal heart rhythm) pamidronate zoledronic acid This list may not describe all possible interactions. Give your health care provider a list of all the medicines, herbs, non-prescription drugs, or dietary supplements you use. Also tell them if you smoke, drink alcohol, or use illegal drugs. Some items may interact with your medicine. What should I watch for while using this medication? Your condition will be monitored carefully while you are receiving this medicine.  You may need blood work done while you are taking this medicine. This medicine may make you feel generally unwell. This is not uncommon as chemotherapy can affect healthy cells as well as cancer cells. Report any side effects. Continue your course of treatment even though you feel ill unless your healthcare professional tells you to stop. This medicine can make you more sensitive to cold. Do not drink cold drinks or use ice. Cover exposed skin before coming in contact with cold temperatures or cold objects. When out in cold weather wear warm clothing and cover your mouth and nose to warm  the air that goes into your lungs. Tell your doctor if you get sensitive to the cold. Do not become pregnant while taking this medicine or for 9 months after stopping it. Women should inform their health care professional if they wish to become pregnant or think they might be pregnant. Men should not father a child while taking this medicine and for 6 months after stopping it. There is potential for serious side effects to an unborn child. Talk to your health care professional for more information. Do not breast-feed a child while taking this medicine or for 3 months after stopping it. This medicine has caused ovarian failure in some women. This medicine may make it more difficult to get pregnant. Talk to your health care professional if you are concerned about your fertility. This medicine has caused decreased sperm counts in some men. This may make it more difficult to father a child. Talk to your health care professional if you are concerned about your fertility. This medicine may increase your risk of getting an infection. Call your health care professional for advice if you get a fever, chills, or sore throat, or other symptoms of a cold or flu. Do not treat yourself. Try to avoid being around people who are sick. Avoid taking medicines that contain aspirin, acetaminophen, ibuprofen, naproxen, or ketoprofen unless instructed by your health care professional. These medicines may hide a fever. Be careful brushing or flossing your teeth or using a toothpick because you may get an infection or bleed more easily. If you have any dental work done, tell your dentist you are receiving this medicine. What side effects may I notice from receiving this medication? Side effects that you should report to your doctor or health care professional as soon as possible: allergic reactions like skin rash, itching or hives, swelling of the face, lips, or tongue breathing problems cough low blood counts - this medicine  may decrease the number of white blood cells, red blood cells, and platelets. You may be at increased risk for infections and bleeding nausea, vomiting pain, redness, or irritation at site where injected pain, tingling, numbness in the hands or feet signs and symptoms of bleeding such as bloody or black, tarry stools; red or dark brown urine; spitting up blood or brown material that looks like coffee grounds; red spots on the skin; unusual bruising or bleeding from the eyes, gums, or nose signs and symptoms of a dangerous change in heartbeat or heart rhythm like chest pain; dizziness; fast, irregular heartbeat; palpitations; feeling faint or lightheaded; falls signs and symptoms of infection like fever; chills; cough; sore throat; pain or trouble passing urine signs and symptoms of liver injury like dark yellow or brown urine; general ill feeling or flu-like symptoms; light-colored stools; loss of appetite; nausea; right upper belly pain; unusually weak or tired; yellowing of the eyes or skin signs and symptoms of low  red blood cells or anemia such as unusually weak or tired; feeling faint or lightheaded; falls signs and symptoms of muscle injury like dark urine; trouble passing urine or change in the amount of urine; unusually weak or tired; muscle pain; back pain Side effects that usually do not require medical attention (report to your doctor or health care professional if they continue or are bothersome): changes in taste diarrhea gas hair loss loss of appetite mouth sores This list may not describe all possible side effects. Call your doctor for medical advice about side effects. You may report side effects to FDA at 1-800-FDA-1088. Where should I keep my medication? This drug is given in a hospital or clinic and will not be stored at home. NOTE: This sheet is a summary. It may not cover all possible information. If you have questions about this medicine, talk to your doctor, pharmacist, or  health care provider.  2023 Elsevier/Gold Standard (2020-12-29 00:00:00)  Irinotecan injection What is this medication? IRINOTECAN (ir in oh TEE kan ) is a chemotherapy drug. It is used to treat colon and rectal cancer. This medicine may be used for other purposes; ask your health care provider or pharmacist if you have questions. COMMON BRAND NAME(S): Camptosar What should I tell my care team before I take this medication? They need to know if you have any of these conditions: dehydration diarrhea infection (especially a virus infection such as chickenpox, cold sores, or herpes) liver disease low blood counts, like low white cell, platelet, or red cell counts low levels of calcium, magnesium, or potassium in the blood recent or ongoing radiation therapy an unusual or allergic reaction to irinotecan, other medicines, foods, dyes, or preservatives pregnant or trying to get pregnant breast-feeding How should I use this medication? This drug is given as an infusion into a vein. It is administered in a hospital or clinic by a specially trained health care professional. Talk to your pediatrician regarding the use of this medicine in children. Special care may be needed. Overdosage: If you think you have taken too much of this medicine contact a poison control center or emergency room at once. NOTE: This medicine is only for you. Do not share this medicine with others. What if I miss a dose? It is important not to miss your dose. Call your doctor or health care professional if you are unable to keep an appointment. What may interact with this medication? Do not take this medicine with any of the following medications: cobicistat itraconazole This medicine may interact with the following medications: antiviral medicines for HIV or AIDS certain antibiotics like rifampin or rifabutin certain medicines for fungal infections like ketoconazole, posaconazole, and voriconazole certain medicines  for seizures like carbamazepine, phenobarbital, phenotoin clarithromycin gemfibrozil nefazodone St. John's Wort This list may not describe all possible interactions. Give your health care provider a list of all the medicines, herbs, non-prescription drugs, or dietary supplements you use. Also tell them if you smoke, drink alcohol, or use illegal drugs. Some items may interact with your medicine. What should I watch for while using this medication? Your condition will be monitored carefully while you are receiving this medicine. You will need important blood work done while you are taking this medicine. This drug may make you feel generally unwell. This is not uncommon, as chemotherapy can affect healthy cells as well as cancer cells. Report any side effects. Continue your course of treatment even though you feel ill unless your doctor tells you to stop.  In some cases, you may be given additional medicines to help with side effects. Follow all directions for their use. You may get drowsy or dizzy. Do not drive, use machinery, or do anything that needs mental alertness until you know how this medicine affects you. Do not stand or sit up quickly, especially if you are an older patient. This reduces the risk of dizzy or fainting spells. Call your health care professional for advice if you get a fever, chills, or sore throat, or other symptoms of a cold or flu. Do not treat yourself. This medicine decreases your body's ability to fight infections. Try to avoid being around people who are sick. Avoid taking products that contain aspirin, acetaminophen, ibuprofen, naproxen, or ketoprofen unless instructed by your doctor. These medicines may hide a fever. This medicine may increase your risk to bruise or bleed. Call your doctor or health care professional if you notice any unusual bleeding. Be careful brushing and flossing your teeth or using a toothpick because you may get an infection or bleed more easily.  If you have any dental work done, tell your dentist you are receiving this medicine. Do not become pregnant while taking this medicine or for 6 months after stopping it. Women should inform their health care professional if they wish to become pregnant or think they might be pregnant. Men should not father a child while taking this medicine and for 3 months after stopping it. There is potential for serious side effects to an unborn child. Talk to your health care professional for more information. Do not breast-feed an infant while taking this medicine or for 7 days after stopping it. This medicine has caused ovarian failure in some women. This medicine may make it more difficult to get pregnant. Talk to your health care professional if you are concerned about your fertility. This medicine has caused decreased sperm counts in some men. This may make it more difficult to father a child. Talk to your health care professional if you are concerned about your fertility. What side effects may I notice from receiving this medication? Side effects that you should report to your doctor or health care professional as soon as possible: allergic reactions like skin rash, itching or hives, swelling of the face, lips, or tongue chest pain diarrhea flushing, runny nose, sweating during infusion low blood counts - this medicine may decrease the number of white blood cells, red blood cells and platelets. You may be at increased risk for infections and bleeding. nausea, vomiting pain, swelling, warmth in the leg signs of decreased platelets or bleeding - bruising, pinpoint red spots on the skin, black, tarry stools, blood in the urine signs of infection - fever or chills, cough, sore throat, pain or difficulty passing urine signs of decreased red blood cells - unusually weak or tired, fainting spells, lightheadedness Side effects that usually do not require medical attention (report to your doctor or health care  professional if they continue or are bothersome): constipation hair loss headache loss of appetite mouth sores stomach pain This list may not describe all possible side effects. Call your doctor for medical advice about side effects. You may report side effects to FDA at 1-800-FDA-1088. Where should I keep my medication? This drug is given in a hospital or clinic and will not be stored at home. NOTE: This sheet is a summary. It may not cover all possible information. If you have questions about this medicine, talk to your doctor, pharmacist, or health care provider.  2023 Elsevier/Gold Standard (2020-12-29 00:00:00)  Leucovorin injection What is this medication? LEUCOVORIN (loo koe VOR in) is used to prevent or treat the harmful effects of some medicines. This medicine is used to treat anemia caused by a low amount of folic acid in the body. It is also used with 5-fluorouracil (5-FU) to treat colon cancer. This medicine may be used for other purposes; ask your health care provider or pharmacist if you have questions. What should I tell my care team before I take this medication? They need to know if you have any of these conditions: anemia from low levels of vitamin B-12 in the blood an unusual or allergic reaction to leucovorin, folic acid, other medicines, foods, dyes, or preservatives pregnant or trying to get pregnant breast-feeding How should I use this medication? This medicine is for injection into a muscle or into a vein. It is given by a health care professional in a hospital or clinic setting. Talk to your pediatrician regarding the use of this medicine in children. Special care may be needed. Overdosage: If you think you have taken too much of this medicine contact a poison control center or emergency room at once. NOTE: This medicine is only for you. Do not share this medicine with others. What if I miss a dose? This does not apply. What may interact with this  medication? capecitabine fluorouracil phenobarbital phenytoin primidone trimethoprim-sulfamethoxazole This list may not describe all possible interactions. Give your health care provider a list of all the medicines, herbs, non-prescription drugs, or dietary supplements you use. Also tell them if you smoke, drink alcohol, or use illegal drugs. Some items may interact with your medicine. What should I watch for while using this medication? Your condition will be monitored carefully while you are receiving this medicine. This medicine may increase the side effects of 5-fluorouracil, 5-FU. Tell your doctor or health care professional if you have diarrhea or mouth sores that do not get better or that get worse. What side effects may I notice from receiving this medication? Side effects that you should report to your doctor or health care professional as soon as possible: allergic reactions like skin rash, itching or hives, swelling of the face, lips, or tongue breathing problems fever, infection mouth sores unusual bleeding or bruising unusually weak or tired Side effects that usually do not require medical attention (report to your doctor or health care professional if they continue or are bothersome): constipation or diarrhea loss of appetite nausea, vomiting This list may not describe all possible side effects. Call your doctor for medical advice about side effects. You may report side effects to FDA at 1-800-FDA-1088. Where should I keep my medication? This drug is given in a hospital or clinic and will not be stored at home. NOTE: This sheet is a summary. It may not cover all possible information. If you have questions about this medicine, talk to your doctor, pharmacist, or health care provider.  2023 Elsevier/Gold Standard (2007-08-06 00:00:00)  Fluorouracil, 5-FU injection What is this medication? FLUOROURACIL, 5-FU (flure oh YOOR a sil) is a chemotherapy drug. It slows the growth  of cancer cells. This medicine is used to treat many types of cancer like breast cancer, colon or rectal cancer, pancreatic cancer, and stomach cancer. This medicine may be used for other purposes; ask your health care provider or pharmacist if you have questions. COMMON BRAND NAME(S): Adrucil What should I tell my care team before I take this medication? They need to know if  you have any of these conditions: blood disorders dihydropyrimidine dehydrogenase (DPD) deficiency infection (especially a virus infection such as chickenpox, cold sores, or herpes) kidney disease liver disease malnourished, poor nutrition recent or ongoing radiation therapy an unusual or allergic reaction to fluorouracil, other chemotherapy, other medicines, foods, dyes, or preservatives pregnant or trying to get pregnant breast-feeding How should I use this medication? This drug is given as an infusion or injection into a vein. It is administered in a hospital or clinic by a specially trained health care professional. Talk to your pediatrician regarding the use of this medicine in children. Special care may be needed. Overdosage: If you think you have taken too much of this medicine contact a poison control center or emergency room at once. NOTE: This medicine is only for you. Do not share this medicine with others. What if I miss a dose? It is important not to miss your dose. Call your doctor or health care professional if you are unable to keep an appointment. What may interact with this medication? Do not take this medicine with any of the following medications: live virus vaccines This medicine may also interact with the following medications: medicines that treat or prevent blood clots like warfarin, enoxaparin, and dalteparin This list may not describe all possible interactions. Give your health care provider a list of all the medicines, herbs, non-prescription drugs, or dietary supplements you use. Also tell  them if you smoke, drink alcohol, or use illegal drugs. Some items may interact with your medicine. What should I watch for while using this medication? Visit your doctor for checks on your progress. This drug may make you feel generally unwell. This is not uncommon, as chemotherapy can affect healthy cells as well as cancer cells. Report any side effects. Continue your course of treatment even though you feel ill unless your doctor tells you to stop. In some cases, you may be given additional medicines to help with side effects. Follow all directions for their use. Call your doctor or health care professional for advice if you get a fever, chills or sore throat, or other symptoms of a cold or flu. Do not treat yourself. This drug decreases your body's ability to fight infections. Try to avoid being around people who are sick. This medicine may increase your risk to bruise or bleed. Call your doctor or health care professional if you notice any unusual bleeding. Be careful brushing and flossing your teeth or using a toothpick because you may get an infection or bleed more easily. If you have any dental work done, tell your dentist you are receiving this medicine. Avoid taking products that contain aspirin, acetaminophen, ibuprofen, naproxen, or ketoprofen unless instructed by your doctor. These medicines may hide a fever. Do not become pregnant while taking this medicine. Women should inform their doctor if they wish to become pregnant or think they might be pregnant. There is a potential for serious side effects to an unborn child. Talk to your health care professional or pharmacist for more information. Do not breast-feed an infant while taking this medicine. Men should inform their doctor if they wish to father a child. This medicine may lower sperm counts. Do not treat diarrhea with over the counter products. Contact your doctor if you have diarrhea that lasts more than 2 days or if it is severe and  watery. This medicine can make you more sensitive to the sun. Keep out of the sun. If you cannot avoid being in the sun, wear  protective clothing and use sunscreen. Do not use sun lamps or tanning beds/booths. What side effects may I notice from receiving this medication? Side effects that you should report to your doctor or health care professional as soon as possible: allergic reactions like skin rash, itching or hives, swelling of the face, lips, or tongue low blood counts - this medicine may decrease the number of white blood cells, red blood cells and platelets. You may be at increased risk for infections and bleeding. signs of infection - fever or chills, cough, sore throat, pain or difficulty passing urine signs of decreased platelets or bleeding - bruising, pinpoint red spots on the skin, black, tarry stools, blood in the urine signs of decreased red blood cells - unusually weak or tired, fainting spells, lightheadedness breathing problems changes in vision chest pain mouth sores nausea and vomiting pain, swelling, redness at site where injected pain, tingling, numbness in the hands or feet redness, swelling, or sores on hands or feet stomach pain unusual bleeding Side effects that usually do not require medical attention (report to your doctor or health care professional if they continue or are bothersome): changes in finger or toe nails diarrhea dry or itchy skin hair loss headache loss of appetite sensitivity of eyes to the light stomach upset unusually teary eyes This list may not describe all possible side effects. Call your doctor for medical advice about side effects. You may report side effects to FDA at 1-800-FDA-1088. Where should I keep my medication? This drug is given in a hospital or clinic and will not be stored at home. NOTE: This sheet is a summary. It may not cover all possible information. If you have questions about this medicine, talk to your doctor,  pharmacist, or health care provider.  2023 [employer]/Gold Standard (2020-12-29 00:00:00)

## 2021-11-14 NOTE — Progress Notes (Signed)
Patient seen by Lisa Thomas NP today  Vitals are within treatment parameters.  Labs reviewed by Lisa Thomas NP and are within treatment parameters.  Per physician team, patient is ready for treatment and there are NO modifications to the treatment plan.     

## 2021-11-14 NOTE — Progress Notes (Signed)
[facility] OFFICE PROGRESS NOTE   Diagnosis:  Pancreas cancer  INTERVAL HISTORY:   Tara Jensen returns as scheduled.  She completed cycle 7 FOLFIRINOX 10/31/2021.  She had mild intermittent nausea for several days.  Fairly good relief with home nausea medications.  No mouth sores.  No diarrhea.  Cold sensitivity lasted about 6 days.  No numbness or tingling unrelated to cold exposure.  Objective:  Vital signs in last 24 hours:  Blood pressure 120/78, pulse 83, temperature 97.9 F (36.6 C), temperature source Oral, resp. rate 18, height '5\' 10"'  (1.778 m), weight 110 lb 3.2 oz (50 kg), last menstrual period 11/01/2018, SpO2 100 %.    HEENT: No thrush or ulcers. Resp: Lungs clear bilaterally. Cardio: Regular rate and rhythm. GI: Abdomen soft and nontender.  No hepatomegaly. Vascular: No leg edema. Neuro: Vibratory sense mildly decreased over the fingertips per tuning fork exam. Skin: Palms without erythema. To cath without erythema.  Lab Results:  Lab Results  Component Value Date   WBC 11.5 (H) 11/14/2021   HGB 10.2 (L) 11/14/2021   HCT 32.0 (L) 11/14/2021   MCV 100.0 11/14/2021   PLT 146 (L) 11/14/2021   NEUTROABS 8.2 (H) 11/14/2021    Imaging:  No results found.  Medications: I have reviewed the patient's current medications.  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  Less than 180 degree abutment of the distal celiac, common hepatic, and splenic arteries, less than 180 degree abutment of the SMA CT chest 07/18/2021-no evidence of metastatic disease EUS 07/16/2021-extrinsic impression on the stomach at the posterior wall of the gastric body, no gross lesion in the duodenum, 45 x 36 mm pancreas body mass, abutment of the SMA, celiac trunk,  and compression of the splenoportal confluence.  No malignant appearing lymph nodes, numerous venous collaterals adjacent to the portal vein, FNA biopsy-malignant cells consistent with adenocarcinoma, T4N0 by EUS Markedly elevated CA 19-9 [employer] 07/31/2021-K-ras G12R, MSI high-not detected, ATM VUS Cycle 1 FOLFOX 08/08/2021 Cycle 2 FOLFIRINOX 08/22/2021 CT abdomen[facility]pelvis at [facility] 08/31/2021-hypoattenuating liver lesions consistent with metastases, mass in the pancreas neck/body with greater than 100 degree encasement of the celiac axis and common hepatic artery.  Less than 180 degree abutment of the SMA, long segment occlusion of the portal splenic confluence, splenic vein occluded, multiple collateral vessels in the upper abdomen, prominent left periaortic node Cycle 3 FOLFIRINOX 09/05/2021 Cycle 4 FOLFIRINOX 09/19/2021 Cycle 5 FOLFIRINOX 10/03/2021 CTs 10/16/2021-decrease size of primary pancreas mass, hepatic metastases, and a suspicious retroperitoneal node, resolution of left supraclavicular/low jugular adenopathy compared to a CT chest 07/18/2021.  No evidence of disease progression. Cycle 6 FOLFIRINOX 10/17/2021 Cycle 7 FOLFIRINOX 10/31/2021 Cycle 8 FOLFIRINOX 11/14/2021 Pain secondary to #1-controlled with tramadol and Tylenol. Weight loss secondary to #1 History of situational depression Family history of breast cancer    Disposition: Tara Jensen appears stable.  Performance status continues to be improved.  She has now completed 7 cycles of FOLFIRINOX, overall tolerating well.  Plan to proceed with cycle 8 today as scheduled.  CBC from today reviewed.  Counts adequate to proceed as above.  She will return for lab, follow-up, FOLFIRINOX in 2 weeks.  We are available to see her sooner if needed.  Ned Card ANP/GNP-BC   11/14/2021  8:39 AM

## 2021-11-15 ENCOUNTER — Other Ambulatory Visit: Payer: Self-pay | Admitting: Nurse Practitioner

## 2021-11-15 ENCOUNTER — Other Ambulatory Visit: Payer: Self-pay

## 2021-11-15 DIAGNOSIS — C251 Malignant neoplasm of body of pancreas: Secondary | ICD-10-CM

## 2021-11-15 MED ORDER — TRAMADOL HCL 50 MG PO TABS: 50.0000 mg | ORAL_TABLET | Freq: Four times a day (QID) | ORAL | 0 refills | Status: DC | PRN

## 2021-11-16 ENCOUNTER — Inpatient Hospital Stay: Payer: BC Managed Care – PPO

## 2021-11-16 VITALS — BP 109/66 | HR 64 | Temp 98.2°F | Resp 20

## 2021-11-16 DIAGNOSIS — C251 Malignant neoplasm of body of pancreas: Secondary | ICD-10-CM

## 2021-11-16 LAB — CANCER ANTIGEN 19-9: CA 19-9: 564 U/mL — ABNORMAL HIGH (ref 0–35)

## 2021-11-16 MED ORDER — SODIUM CHLORIDE 0.9% FLUSH
10.0000 mL | INTRAVENOUS | Status: DC | PRN
  Administered 2021-11-16: 10 mL

## 2021-11-16 MED ORDER — HEPARIN SOD (PORK) LOCK FLUSH 100 UNIT/ML IV SOLN
500.0000 [IU] | Freq: Once | INTRAVENOUS | Status: AC | PRN
  Administered 2021-11-16: 500 [IU]

## 2021-11-16 MED ORDER — PEGFILGRASTIM-CBQV 6 MG/0.6ML ~~LOC~~ SOSY
6.0000 mg | PREFILLED_SYRINGE | Freq: Once | SUBCUTANEOUS | Status: AC
  Administered 2021-11-16: 6 mg via SUBCUTANEOUS
  Filled 2021-11-16: qty 0.6

## 2021-11-16 MED ORDER — SODIUM CHLORIDE 0.9 % IV SOLN: INTRAVENOUS | Status: DC

## 2021-11-16 NOTE — Patient Instructions (Signed)

## 2021-11-24 ENCOUNTER — Other Ambulatory Visit: Payer: Self-pay | Admitting: Oncology

## 2021-11-24 DIAGNOSIS — C251 Malignant neoplasm of body of pancreas: Secondary | ICD-10-CM

## 2021-11-28 ENCOUNTER — Other Ambulatory Visit: Payer: Self-pay | Admitting: *Deleted

## 2021-11-28 ENCOUNTER — Inpatient Hospital Stay: Payer: BC Managed Care – PPO

## 2021-11-28 ENCOUNTER — Inpatient Hospital Stay (HOSPITAL_BASED_OUTPATIENT_CLINIC_OR_DEPARTMENT_OTHER): Payer: BC Managed Care – PPO | Admitting: Nurse Practitioner

## 2021-11-28 ENCOUNTER — Other Ambulatory Visit: Payer: Self-pay

## 2021-11-28 ENCOUNTER — Encounter: Payer: Self-pay | Admitting: Nurse Practitioner

## 2021-11-28 VITALS — BP 128/70 | HR 79 | Temp 98.1°F | Resp 18 | Ht 70.0 in | Wt 110.8 lb

## 2021-11-28 VITALS — BP 116/59 | HR 63 | Resp 18

## 2021-11-28 DIAGNOSIS — C251 Malignant neoplasm of body of pancreas: Secondary | ICD-10-CM

## 2021-11-28 LAB — CMP (CANCER CENTER ONLY)
ALT: 18 U/L (ref 0–44)
AST: 18 U/L (ref 15–41)
Albumin: 4.1 g/dL (ref 3.5–5.0)
Alkaline Phosphatase: 119 U/L (ref 38–126)
Anion gap: 8 (ref 5–15)
BUN: 18 mg/dL (ref 6–20)
CO2: 28 mmol/L (ref 22–32)
Calcium: 9.2 mg/dL (ref 8.9–10.3)
Chloride: 104 mmol/L (ref 98–111)
Creatinine: 0.62 mg/dL (ref 0.44–1.00)
GFR, Estimated: >60 mL/min (ref >60)
Glucose, Bld: 116 mg/dL — ABNORMAL HIGH (ref 70–99)
Potassium: 4 mmol/L (ref 3.5–5.1)
Sodium: 140 mmol/L (ref 135–145)
Total Bilirubin: 0.2 mg/dL — ABNORMAL LOW (ref 0.3–1.2)
Total Protein: 6.6 g/dL (ref 6.5–8.1)

## 2021-11-28 LAB — CBC WITH DIFFERENTIAL (CANCER CENTER ONLY)
Abs Immature Granulocytes: 0.27 10*3/uL — ABNORMAL HIGH (ref 0.00–0.07)
Basophils Absolute: 0.1 10*3/uL (ref 0.0–0.1)
Basophils Relative: 0 %
Eosinophils Absolute: 0.1 10*3/uL (ref 0.0–0.5)
Eosinophils Relative: 1 %
HCT: 31.4 % — ABNORMAL LOW (ref 36.0–46.0)
Hemoglobin: 10.1 g/dL — ABNORMAL LOW (ref 12.0–15.0)
Immature Granulocytes: 2 %
Lymphocytes Relative: 14 %
Lymphs Abs: 1.7 10*3/uL (ref 0.7–4.0)
MCH: 32.4 pg (ref 26.0–34.0)
MCHC: 32.2 g/dL (ref 30.0–36.0)
MCV: 100.6 fL — ABNORMAL HIGH (ref 80.0–100.0)
Monocytes Absolute: 0.7 10*3/uL (ref 0.1–1.0)
Monocytes Relative: 6 %
Neutro Abs: 8.6 10*3/uL — ABNORMAL HIGH (ref 1.7–7.7)
Neutrophils Relative %: 77 %
Platelet Count: 125 10*3/uL — ABNORMAL LOW (ref 150–400)
RBC: 3.12 MIL/uL — ABNORMAL LOW (ref 3.87–5.11)
RDW: 16.1 % — ABNORMAL HIGH (ref 11.5–15.5)
WBC Count: 11.4 10*3/uL — ABNORMAL HIGH (ref 4.0–10.5)
nRBC: 0 % (ref 0.0–0.2)

## 2021-11-28 MED ORDER — SODIUM CHLORIDE 0.9 % IV SOLN
150.0000 mg | Freq: Once | INTRAVENOUS | Status: AC
  Administered 2021-11-28: 150 mg via INTRAVENOUS
  Filled 2021-11-28: qty 5

## 2021-11-28 MED ORDER — SODIUM CHLORIDE 0.9 % IV SOLN
2400.0000 mg/m2 | INTRAVENOUS | Status: DC
  Administered 2021-11-28: 3750 mg via INTRAVENOUS
  Filled 2021-11-28: qty 75

## 2021-11-28 MED ORDER — DEXAMETHASONE 4 MG PO TABS: 4.0000 mg | ORAL_TABLET | Freq: Two times a day (BID) | ORAL | 0 refills | Status: DC

## 2021-11-28 MED ORDER — SODIUM CHLORIDE 0.9 % IV SOLN
400.0000 mg/m2 | Freq: Once | INTRAVENOUS | Status: AC
  Administered 2021-11-28: 624 mg via INTRAVENOUS
  Filled 2021-11-28: qty 31.2

## 2021-11-28 MED ORDER — PALONOSETRON HCL INJECTION 0.25 MG/5ML
0.2500 mg | Freq: Once | INTRAVENOUS | Status: AC
  Administered 2021-11-28: 0.25 mg via INTRAVENOUS
  Filled 2021-11-28: qty 5

## 2021-11-28 MED ORDER — LIDOCAINE-PRILOCAINE 2.5-2.5 % EX CREA: 1.0000 | TOPICAL_CREAM | CUTANEOUS | 2 refills | Status: DC | PRN

## 2021-11-28 MED ORDER — ATROPINE SULFATE 1 MG/ML IV SOLN
0.5000 mg | Freq: Once | INTRAVENOUS | Status: AC | PRN
  Administered 2021-11-28: 0.5 mg via INTRAVENOUS
  Filled 2021-11-28: qty 1

## 2021-11-28 MED ORDER — OXALIPLATIN CHEMO INJECTION 100 MG/20ML
85.0000 mg/m2 | Freq: Once | INTRAVENOUS | Status: AC
  Administered 2021-11-28: 135 mg via INTRAVENOUS
  Filled 2021-11-28: qty 20

## 2021-11-28 MED ORDER — DEXTROSE 5 % IV SOLN: Freq: Once | INTRAVENOUS | Status: AC

## 2021-11-28 MED ORDER — SODIUM CHLORIDE 0.9 % IV SOLN
10.0000 mg | Freq: Once | INTRAVENOUS | Status: AC
  Administered 2021-11-28: 10 mg via INTRAVENOUS
  Filled 2021-11-28: qty 1

## 2021-11-28 MED ORDER — SODIUM CHLORIDE 0.9 % IV SOLN
150.0000 mg/m2 | Freq: Once | INTRAVENOUS | Status: AC
  Administered 2021-11-28: 240 mg via INTRAVENOUS
  Filled 2021-11-28: qty 2

## 2021-11-28 NOTE — Progress Notes (Signed)
Patient seen by Lisa Thomas NP today  Vitals are within treatment parameters.  Labs reviewed by Lisa Thomas NP and are within treatment parameters.  Per physician team, patient is ready for treatment and there are NO modifications to the treatment plan.     

## 2021-11-28 NOTE — Patient Instructions (Signed)

## 2021-11-28 NOTE — Addendum Note (Signed)
Addended by: Teodoro Spray on: 11/28/2021 03:34 PM   Modules accepted: Orders

## 2021-11-28 NOTE — Patient Instructions (Addendum)
Benoit  The chemotherapy medication bag should finish at 46 hours, 96 hours, or 7 days. For example, if your pump is scheduled for 46 hours and it was put on at 4:00 p.m., it should finish at 2:00 p.m. the day it is scheduled to come off regardless of your appointment time.     Estimated time to finish at 1pm Friday, October 20th, 2023.   If the display on your pump reads "Low Volume" and it is beeping, take the batteries out of the pump and come to the cancer center for it to be taken off.   If the pump alarms go off prior to the pump reading "Low Volume" then call [phone] and someone can assist you.  If the plunger comes out and the chemotherapy medication is leaking out, please use your home chemo spill kit to clean up the spill. Do NOT use paper towels or other household products.  If you have problems or questions regarding your pump, please call either [phone] (24 hours a day) or the cancer center Monday-Friday 8:00 a.m.- 4:30 p.m. at the clinic number and we will assist you. If you are unable to get assistance, then go to the nearest Emergency Department and ask the staff to contact the IV team for assistance.   Discharge Instructions: Thank you for choosing [facility] to provide your oncology and hematology care.   If you have a lab appointment with the [facility], please go directly to the [facility] and check in at the registration area.   Wear comfortable clothing and clothing appropriate for easy access to any Portacath or PICC line.   We strive to give you quality time with your provider. You may need to reschedule your appointment if you arrive late (15 or more minutes).  Arriving late affects you and other patients whose appointments are after yours.  Also, if you miss three or more appointments without notifying the office, you may be dismissed from the clinic at the provider's discretion.      For prescription  refill requests, have your pharmacy contact our office and allow 72 hours for refills to be completed.    Today you received the following chemotherapy and/or immunotherapy agents Oxaliplatin,Irinotecan, Leucovorin, Fluorouracil      To help prevent nausea and vomiting after your treatment, we encourage you to take your nausea medication as directed.  BELOW ARE SYMPTOMS THAT SHOULD BE REPORTED IMMEDIATELY: *FEVER GREATER THAN 100.4 F (38 C) OR HIGHER *CHILLS OR SWEATING *NAUSEA AND VOMITING THAT IS NOT CONTROLLED WITH YOUR NAUSEA MEDICATION *UNUSUAL SHORTNESS OF BREATH *UNUSUAL BRUISING OR BLEEDING *URINARY PROBLEMS (pain or burning when urinating, or frequent urination) *BOWEL PROBLEMS (unusual diarrhea, constipation, pain near the anus) TENDERNESS IN MOUTH AND THROAT WITH OR WITHOUT PRESENCE OF ULCERS (sore throat, sores in mouth, or a toothache) UNUSUAL RASH, SWELLING OR PAIN  UNUSUAL VAGINAL DISCHARGE OR ITCHING   Items with * indicate a potential emergency and should be followed up as soon as possible or go to the Emergency Department if any problems should occur.  Please show the CHEMOTHERAPY ALERT CARD or IMMUNOTHERAPY ALERT CARD at check-in to the Emergency Department and triage nurse.  Should you have questions after your visit or need to cancel or reschedule your appointment, please contact [facility]  Dept: [phone]  and follow the prompts.  Office hours are 8:00 a.m. to 4:30 p.m. Monday - Friday. Please note that voicemails  left after 4:00 p.m. may not be returned until the following business day.  We are closed weekends and major holidays. You have access to a nurse at all times for urgent questions. Please call the main number to the clinic Dept: [phone] and follow the prompts.   For any non-urgent questions, you may also contact your provider using MyChart. We now offer e-Visits for anyone 15 and older to request care online for  non-urgent symptoms. For details visit [email or web address].   Also download the MyChart app! Go to the app store, search "MyChart", open the app, select [facility], and log in with your MyChart username and password.  Masks are optional in the cancer centers. If you would like for your care team to wear a mask while they are taking care of you, please let them know. For doctor visits, patients may have with them one support person who is at least 54 years old. At this time, visitors are not allowed in the infusion area.  Oxaliplatin Injection What is this medication? OXALIPLATIN (ox AL i PLA tin) is a chemotherapy drug. It targets fast dividing cells, like cancer cells, and causes these cells to die. This medicine is used to treat cancers of the colon and rectum, and many other cancers. This medicine may be used for other purposes; ask your health care provider or pharmacist if you have questions. COMMON BRAND NAME(S): Eloxatin What should I tell my care team before I take this medication? They need to know if you have any of these conditions: heart disease history of irregular heartbeat liver disease low blood counts, like white cells, platelets, or red blood cells lung or breathing disease, like asthma take medicines that treat or prevent blood clots tingling of the fingers or toes, or other nerve disorder an unusual or allergic reaction to oxaliplatin, other chemotherapy, other medicines, foods, dyes, or preservatives pregnant or trying to get pregnant breast-feeding How should I use this medication? This drug is given as an infusion into a vein. It is administered in a hospital or clinic by a specially trained health care professional. Talk to your pediatrician regarding the use of this medicine in children. Special care may be needed. Overdosage: If you think you have taken too much of this medicine contact a poison control center or emergency room at once. NOTE: This medicine is  only for you. Do not share this medicine with others. What if I miss a dose? It is important not to miss a dose. Call your doctor or health care professional if you are unable to keep an appointment. What may interact with this medication? Do not take this medicine with any of the following medications: cisapride dronedarone pimozide thioridazine This medicine may also interact with the following medications: aspirin and aspirin-like medicines certain medicines that treat or prevent blood clots like warfarin, apixaban, dabigatran, and rivaroxaban cisplatin cyclosporine diuretics medicines for infection like acyclovir, adefovir, amphotericin B, bacitracin, cidofovir, foscarnet, ganciclovir, gentamicin, pentamidine, vancomycin NSAIDs, medicines for pain and inflammation, like ibuprofen or naproxen other medicines that prolong the QT interval (an abnormal heart rhythm) pamidronate zoledronic acid This list may not describe all possible interactions. Give your health care provider a list of all the medicines, herbs, non-prescription drugs, or dietary supplements you use. Also tell them if you smoke, drink alcohol, or use illegal drugs. Some items may interact with your medicine. What should I watch for while using this medication? Your condition will be monitored carefully while you are receiving this medicine.  You may need blood work done while you are taking this medicine. This medicine may make you feel generally unwell. This is not uncommon as chemotherapy can affect healthy cells as well as cancer cells. Report any side effects. Continue your course of treatment even though you feel ill unless your healthcare professional tells you to stop. This medicine can make you more sensitive to cold. Do not drink cold drinks or use ice. Cover exposed skin before coming in contact with cold temperatures or cold objects. When out in cold weather wear warm clothing and cover your mouth and nose to warm  the air that goes into your lungs. Tell your doctor if you get sensitive to the cold. Do not become pregnant while taking this medicine or for 9 months after stopping it. Women should inform their health care professional if they wish to become pregnant or think they might be pregnant. Men should not father a child while taking this medicine and for 6 months after stopping it. There is potential for serious side effects to an unborn child. Talk to your health care professional for more information. Do not breast-feed a child while taking this medicine or for 3 months after stopping it. This medicine has caused ovarian failure in some women. This medicine may make it more difficult to get pregnant. Talk to your health care professional if you are concerned about your fertility. This medicine has caused decreased sperm counts in some men. This may make it more difficult to father a child. Talk to your health care professional if you are concerned about your fertility. This medicine may increase your risk of getting an infection. Call your health care professional for advice if you get a fever, chills, or sore throat, or other symptoms of a cold or flu. Do not treat yourself. Try to avoid being around people who are sick. Avoid taking medicines that contain aspirin, acetaminophen, ibuprofen, naproxen, or ketoprofen unless instructed by your health care professional. These medicines may hide a fever. Be careful brushing or flossing your teeth or using a toothpick because you may get an infection or bleed more easily. If you have any dental work done, tell your dentist you are receiving this medicine. What side effects may I notice from receiving this medication? Side effects that you should report to your doctor or health care professional as soon as possible: allergic reactions like skin rash, itching or hives, swelling of the face, lips, or tongue breathing problems cough low blood counts - this medicine  may decrease the number of white blood cells, red blood cells, and platelets. You may be at increased risk for infections and bleeding nausea, vomiting pain, redness, or irritation at site where injected pain, tingling, numbness in the hands or feet signs and symptoms of bleeding such as bloody or black, tarry stools; red or dark brown urine; spitting up blood or brown material that looks like coffee grounds; red spots on the skin; unusual bruising or bleeding from the eyes, gums, or nose signs and symptoms of a dangerous change in heartbeat or heart rhythm like chest pain; dizziness; fast, irregular heartbeat; palpitations; feeling faint or lightheaded; falls signs and symptoms of infection like fever; chills; cough; sore throat; pain or trouble passing urine signs and symptoms of liver injury like dark yellow or brown urine; general ill feeling or flu-like symptoms; light-colored stools; loss of appetite; nausea; right upper belly pain; unusually weak or tired; yellowing of the eyes or skin signs and symptoms of low  red blood cells or anemia such as unusually weak or tired; feeling faint or lightheaded; falls signs and symptoms of muscle injury like dark urine; trouble passing urine or change in the amount of urine; unusually weak or tired; muscle pain; back pain Side effects that usually do not require medical attention (report to your doctor or health care professional if they continue or are bothersome): changes in taste diarrhea gas hair loss loss of appetite mouth sores This list may not describe all possible side effects. Call your doctor for medical advice about side effects. You may report side effects to FDA at 1-800-FDA-1088. Where should I keep my medication? This drug is given in a hospital or clinic and will not be stored at home. NOTE: This sheet is a summary. It may not cover all possible information. If you have questions about this medicine, talk to your doctor, pharmacist, or  health care provider.  2023 Elsevier/Gold Standard (2020-12-29 00:00:00)  Irinotecan injection What is this medication? IRINOTECAN (ir in oh TEE kan ) is a chemotherapy drug. It is used to treat colon and rectal cancer. This medicine may be used for other purposes; ask your health care provider or pharmacist if you have questions. COMMON BRAND NAME(S): Camptosar What should I tell my care team before I take this medication? They need to know if you have any of these conditions: dehydration diarrhea infection (especially a virus infection such as chickenpox, cold sores, or herpes) liver disease low blood counts, like low white cell, platelet, or red cell counts low levels of calcium, magnesium, or potassium in the blood recent or ongoing radiation therapy an unusual or allergic reaction to irinotecan, other medicines, foods, dyes, or preservatives pregnant or trying to get pregnant breast-feeding How should I use this medication? This drug is given as an infusion into a vein. It is administered in a hospital or clinic by a specially trained health care professional. Talk to your pediatrician regarding the use of this medicine in children. Special care may be needed. Overdosage: If you think you have taken too much of this medicine contact a poison control center or emergency room at once. NOTE: This medicine is only for you. Do not share this medicine with others. What if I miss a dose? It is important not to miss your dose. Call your doctor or health care professional if you are unable to keep an appointment. What may interact with this medication? Do not take this medicine with any of the following medications: cobicistat itraconazole This medicine may interact with the following medications: antiviral medicines for HIV or AIDS certain antibiotics like rifampin or rifabutin certain medicines for fungal infections like ketoconazole, posaconazole, and voriconazole certain medicines  for seizures like carbamazepine, phenobarbital, phenotoin clarithromycin gemfibrozil nefazodone St. John's Wort This list may not describe all possible interactions. Give your health care provider a list of all the medicines, herbs, non-prescription drugs, or dietary supplements you use. Also tell them if you smoke, drink alcohol, or use illegal drugs. Some items may interact with your medicine. What should I watch for while using this medication? Your condition will be monitored carefully while you are receiving this medicine. You will need important blood work done while you are taking this medicine. This drug may make you feel generally unwell. This is not uncommon, as chemotherapy can affect healthy cells as well as cancer cells. Report any side effects. Continue your course of treatment even though you feel ill unless your doctor tells you to stop.  In some cases, you may be given additional medicines to help with side effects. Follow all directions for their use. You may get drowsy or dizzy. Do not drive, use machinery, or do anything that needs mental alertness until you know how this medicine affects you. Do not stand or sit up quickly, especially if you are an older patient. This reduces the risk of dizzy or fainting spells. Call your health care professional for advice if you get a fever, chills, or sore throat, or other symptoms of a cold or flu. Do not treat yourself. This medicine decreases your body's ability to fight infections. Try to avoid being around people who are sick. Avoid taking products that contain aspirin, acetaminophen, ibuprofen, naproxen, or ketoprofen unless instructed by your doctor. These medicines may hide a fever. This medicine may increase your risk to bruise or bleed. Call your doctor or health care professional if you notice any unusual bleeding. Be careful brushing and flossing your teeth or using a toothpick because you may get an infection or bleed more easily.  If you have any dental work done, tell your dentist you are receiving this medicine. Do not become pregnant while taking this medicine or for 6 months after stopping it. Women should inform their health care professional if they wish to become pregnant or think they might be pregnant. Men should not father a child while taking this medicine and for 3 months after stopping it. There is potential for serious side effects to an unborn child. Talk to your health care professional for more information. Do not breast-feed an infant while taking this medicine or for 7 days after stopping it. This medicine has caused ovarian failure in some women. This medicine may make it more difficult to get pregnant. Talk to your health care professional if you are concerned about your fertility. This medicine has caused decreased sperm counts in some men. This may make it more difficult to father a child. Talk to your health care professional if you are concerned about your fertility. What side effects may I notice from receiving this medication? Side effects that you should report to your doctor or health care professional as soon as possible: allergic reactions like skin rash, itching or hives, swelling of the face, lips, or tongue chest pain diarrhea flushing, runny nose, sweating during infusion low blood counts - this medicine may decrease the number of white blood cells, red blood cells and platelets. You may be at increased risk for infections and bleeding. nausea, vomiting pain, swelling, warmth in the leg signs of decreased platelets or bleeding - bruising, pinpoint red spots on the skin, black, tarry stools, blood in the urine signs of infection - fever or chills, cough, sore throat, pain or difficulty passing urine signs of decreased red blood cells - unusually weak or tired, fainting spells, lightheadedness Side effects that usually do not require medical attention (report to your doctor or health care  professional if they continue or are bothersome): constipation hair loss headache loss of appetite mouth sores stomach pain This list may not describe all possible side effects. Call your doctor for medical advice about side effects. You may report side effects to FDA at 1-800-FDA-1088. Where should I keep my medication? This drug is given in a hospital or clinic and will not be stored at home. NOTE: This sheet is a summary. It may not cover all possible information. If you have questions about this medicine, talk to your doctor, pharmacist, or health care provider.  2023 Elsevier/Gold Standard (2020-12-29 00:00:00)  Leucovorin injection What is this medication? LEUCOVORIN (loo koe VOR in) is used to prevent or treat the harmful effects of some medicines. This medicine is used to treat anemia caused by a low amount of folic acid in the body. It is also used with 5-fluorouracil (5-FU) to treat colon cancer. This medicine may be used for other purposes; ask your health care provider or pharmacist if you have questions. What should I tell my care team before I take this medication? They need to know if you have any of these conditions: anemia from low levels of vitamin B-12 in the blood an unusual or allergic reaction to leucovorin, folic acid, other medicines, foods, dyes, or preservatives pregnant or trying to get pregnant breast-feeding How should I use this medication? This medicine is for injection into a muscle or into a vein. It is given by a health care professional in a hospital or clinic setting. Talk to your pediatrician regarding the use of this medicine in children. Special care may be needed. Overdosage: If you think you have taken too much of this medicine contact a poison control center or emergency room at once. NOTE: This medicine is only for you. Do not share this medicine with others. What if I miss a dose? This does not apply. What may interact with this  medication? capecitabine fluorouracil phenobarbital phenytoin primidone trimethoprim-sulfamethoxazole This list may not describe all possible interactions. Give your health care provider a list of all the medicines, herbs, non-prescription drugs, or dietary supplements you use. Also tell them if you smoke, drink alcohol, or use illegal drugs. Some items may interact with your medicine. What should I watch for while using this medication? Your condition will be monitored carefully while you are receiving this medicine. This medicine may increase the side effects of 5-fluorouracil, 5-FU. Tell your doctor or health care professional if you have diarrhea or mouth sores that do not get better or that get worse. What side effects may I notice from receiving this medication? Side effects that you should report to your doctor or health care professional as soon as possible: allergic reactions like skin rash, itching or hives, swelling of the face, lips, or tongue breathing problems fever, infection mouth sores unusual bleeding or bruising unusually weak or tired Side effects that usually do not require medical attention (report to your doctor or health care professional if they continue or are bothersome): constipation or diarrhea loss of appetite nausea, vomiting This list may not describe all possible side effects. Call your doctor for medical advice about side effects. You may report side effects to FDA at 1-800-FDA-1088. Where should I keep my medication? This drug is given in a hospital or clinic and will not be stored at home. NOTE: This sheet is a summary. It may not cover all possible information. If you have questions about this medicine, talk to your doctor, pharmacist, or health care provider.  2023 Elsevier/Gold Standard (2007-08-06 00:00:00)  Fluorouracil, 5-FU injection What is this medication? FLUOROURACIL, 5-FU (flure oh YOOR a sil) is a chemotherapy drug. It slows the growth  of cancer cells. This medicine is used to treat many types of cancer like breast cancer, colon or rectal cancer, pancreatic cancer, and stomach cancer. This medicine may be used for other purposes; ask your health care provider or pharmacist if you have questions. COMMON BRAND NAME(S): Adrucil What should I tell my care team before I take this medication? They need to know if  you have any of these conditions: blood disorders dihydropyrimidine dehydrogenase (DPD) deficiency infection (especially a virus infection such as chickenpox, cold sores, or herpes) kidney disease liver disease malnourished, poor nutrition recent or ongoing radiation therapy an unusual or allergic reaction to fluorouracil, other chemotherapy, other medicines, foods, dyes, or preservatives pregnant or trying to get pregnant breast-feeding How should I use this medication? This drug is given as an infusion or injection into a vein. It is administered in a hospital or clinic by a specially trained health care professional. Talk to your pediatrician regarding the use of this medicine in children. Special care may be needed. Overdosage: If you think you have taken too much of this medicine contact a poison control center or emergency room at once. NOTE: This medicine is only for you. Do not share this medicine with others. What if I miss a dose? It is important not to miss your dose. Call your doctor or health care professional if you are unable to keep an appointment. What may interact with this medication? Do not take this medicine with any of the following medications: live virus vaccines This medicine may also interact with the following medications: medicines that treat or prevent blood clots like warfarin, enoxaparin, and dalteparin This list may not describe all possible interactions. Give your health care provider a list of all the medicines, herbs, non-prescription drugs, or dietary supplements you use. Also tell  them if you smoke, drink alcohol, or use illegal drugs. Some items may interact with your medicine. What should I watch for while using this medication? Visit your doctor for checks on your progress. This drug may make you feel generally unwell. This is not uncommon, as chemotherapy can affect healthy cells as well as cancer cells. Report any side effects. Continue your course of treatment even though you feel ill unless your doctor tells you to stop. In some cases, you may be given additional medicines to help with side effects. Follow all directions for their use. Call your doctor or health care professional for advice if you get a fever, chills or sore throat, or other symptoms of a cold or flu. Do not treat yourself. This drug decreases your body's ability to fight infections. Try to avoid being around people who are sick. This medicine may increase your risk to bruise or bleed. Call your doctor or health care professional if you notice any unusual bleeding. Be careful brushing and flossing your teeth or using a toothpick because you may get an infection or bleed more easily. If you have any dental work done, tell your dentist you are receiving this medicine. Avoid taking products that contain aspirin, acetaminophen, ibuprofen, naproxen, or ketoprofen unless instructed by your doctor. These medicines may hide a fever. Do not become pregnant while taking this medicine. Women should inform their doctor if they wish to become pregnant or think they might be pregnant. There is a potential for serious side effects to an unborn child. Talk to your health care professional or pharmacist for more information. Do not breast-feed an infant while taking this medicine. Men should inform their doctor if they wish to father a child. This medicine may lower sperm counts. Do not treat diarrhea with over the counter products. Contact your doctor if you have diarrhea that lasts more than 2 days or if it is severe and  watery. This medicine can make you more sensitive to the sun. Keep out of the sun. If you cannot avoid being in the sun, wear  protective clothing and use sunscreen. Do not use sun lamps or tanning beds/booths. What side effects may I notice from receiving this medication? Side effects that you should report to your doctor or health care professional as soon as possible: allergic reactions like skin rash, itching or hives, swelling of the face, lips, or tongue low blood counts - this medicine may decrease the number of white blood cells, red blood cells and platelets. You may be at increased risk for infections and bleeding. signs of infection - fever or chills, cough, sore throat, pain or difficulty passing urine signs of decreased platelets or bleeding - bruising, pinpoint red spots on the skin, black, tarry stools, blood in the urine signs of decreased red blood cells - unusually weak or tired, fainting spells, lightheadedness breathing problems changes in vision chest pain mouth sores nausea and vomiting pain, swelling, redness at site where injected pain, tingling, numbness in the hands or feet redness, swelling, or sores on hands or feet stomach pain unusual bleeding Side effects that usually do not require medical attention (report to your doctor or health care professional if they continue or are bothersome): changes in finger or toe nails diarrhea dry or itchy skin hair loss headache loss of appetite sensitivity of eyes to the light stomach upset unusually teary eyes This list may not describe all possible side effects. Call your doctor for medical advice about side effects. You may report side effects to FDA at 1-800-FDA-1088. Where should I keep my medication? This drug is given in a hospital or clinic and will not be stored at home. NOTE: This sheet is a summary. It may not cover all possible information. If you have questions about this medicine, talk to your doctor,  pharmacist, or health care provider.  2023 Elsevier/Gold Standard (2020-12-29 00:00:00)

## 2021-11-28 NOTE — Progress Notes (Signed)
[facility] OFFICE PROGRESS NOTE   Diagnosis:  Pancreas cancer  INTERVAL HISTORY:   [name] returns as scheduled.  She completed cycle 8 FOLFIRINOX 11/14/2021.  Mild nausea.  No vomiting.  No mouth sores.  No diarrhea.  Cold sensitivity lasted about a week.  No numbness or tingling in the absence of cold exposure.  Objective:  Vital signs in last 24 hours:  Blood pressure 128/70, pulse 79, temperature 98.1 F (36.7 C), temperature source Oral, resp. rate 18, height '5\' 10"'  (1.778 m), weight 110 lb 12.8 oz (50.3 kg), last menstrual period 11/01/2018, SpO2 100 %.    HEENT: No thrush or ulcers. Resp: Lungs clear bilaterally. Cardio: Regular rate and rhythm. GI: Abdomen soft and nontender.  No hepatomegaly. Vascular: No leg edema. Neuro: Vibratory sense moderately decreased over the fingertips per tuning fork exam. Skin: Palms without erythema. Port-A-Cath without erythema.   Lab Results:  Lab Results  Component Value Date   WBC 11.4 (H) 11/28/2021   HGB 10.1 (L) 11/28/2021   HCT 31.4 (L) 11/28/2021   MCV 100.6 (H) 11/28/2021   PLT 125 (L) 11/28/2021   NEUTROABS 8.6 (H) 11/28/2021    Imaging:  No results found.  Medications: I have reviewed the patient's current medications.  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  Less than 180 degree abutment of the distal celiac, common hepatic, and splenic arteries, less than 180 degree abutment of the SMA CT chest 07/18/2021-no evidence of metastatic disease EUS 07/16/2021-extrinsic impression on the stomach at the posterior wall of the gastric body, no gross lesion in the duodenum, 45 x 36 mm pancreas body mass, abutment of the SMA, celiac trunk, and compression of the splenoportal confluence.  No  malignant appearing lymph nodes, numerous venous collaterals adjacent to the portal vein, FNA biopsy-malignant cells consistent with adenocarcinoma, T4N0 by EUS Markedly elevated CA 19-9 Guardant360 07/31/2021-K-ras G12R, MSI high-not detected, ATM VUS Cycle 1 FOLFOX 08/08/2021 Cycle 2 FOLFIRINOX 08/22/2021 CT abdomen[facility]vis at [facility] 08/31/2021-hypoattenuating liver lesions consistent with metastases, mass in the pancreas neck/body with greater than 100 degree encasement of the celiac axis and common hepatic artery.  Less than 180 degree abutment of the SMA, long segment occlusion of the portal splenic confluence, splenic vein occluded, multiple collateral vessels in the upper abdomen, prominent left periaortic node Cycle 3 FOLFIRINOX 09/05/2021 Cycle 4 FOLFIRINOX 09/19/2021 Cycle 5 FOLFIRINOX 10/03/2021 CTs 10/16/2021-decrease size of primary pancreas mass, hepatic metastases, and a suspicious retroperitoneal node, resolution of left supraclavicular/low jugular adenopathy compared to a CT chest 07/18/2021.  No evidence of disease progression. Cycle 6 FOLFIRINOX 10/17/2021 Cycle 7 FOLFIRINOX 10/31/2021 Cycle 8 FOLFIRINOX 11/14/2021 Cycle 9 FOLFIRINOX 11/28/2021 Pain secondary to #1-controlled with tramadol and Tylenol. 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 appears stable.  She has completed 8 cycles of FOLFIRINOX.  She continues to tolerate treatment well.  Performance status continues to be improved.  Plan to proceed with cycle 9 today as scheduled.  CT scans after cycle 10.  CBC and chemistry panel reviewed.  Labs adequate to proceed as above.  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  We will continue to monitor closely.  She  will return for lab, follow-up, cycle 10 FOLFIRINOX in 2  weeks.    Ned Card ANP/GNP-BC   11/28/2021  9:14 AM

## 2021-11-30 ENCOUNTER — Inpatient Hospital Stay: Payer: BC Managed Care – PPO

## 2021-11-30 VITALS — BP 110/68 | HR 67 | Temp 97.5°F | Resp 18

## 2021-11-30 DIAGNOSIS — C251 Malignant neoplasm of body of pancreas: Secondary | ICD-10-CM | POA: Diagnosis not present

## 2021-11-30 LAB — CANCER ANTIGEN 19-9: CA 19-9: 391 U/mL — ABNORMAL HIGH (ref 0–35)

## 2021-11-30 MED ORDER — SODIUM CHLORIDE 0.9% FLUSH
10.0000 mL | INTRAVENOUS | Status: DC | PRN
  Administered 2021-11-30: 10 mL

## 2021-11-30 MED ORDER — HEPARIN SOD (PORK) LOCK FLUSH 100 UNIT/ML IV SOLN
500.0000 [IU] | Freq: Once | INTRAVENOUS | Status: AC | PRN
  Administered 2021-11-30: 500 [IU]

## 2021-11-30 MED ORDER — SODIUM CHLORIDE 0.9 % IV SOLN: Freq: Once | INTRAVENOUS | Status: AC

## 2021-11-30 MED ORDER — PEGFILGRASTIM-CBQV 6 MG/0.6ML ~~LOC~~ SOSY
6.0000 mg | PREFILLED_SYRINGE | Freq: Once | SUBCUTANEOUS | Status: AC
  Administered 2021-11-30: 6 mg via SUBCUTANEOUS

## 2021-11-30 NOTE — Patient Instructions (Signed)
Dehydration, Adult Dehydration is a condition in which there is not enough water or other fluids in the body. This happens when a person loses more fluids than he or she takes in. Important organs, such as the kidneys, brain, and heart, cannot function without a proper amount of fluids. Any loss of fluids from the body can lead to dehydration. Dehydration can be mild, moderate, or severe. It should be treated right away to prevent it from becoming severe. What are the causes? Dehydration may be caused by: Conditions that cause loss of water or other fluids, such as diarrhea, vomiting, or sweating or urinating a lot. Not drinking enough fluids, especially when you are ill or doing activities that require a lot of energy. Other illnesses and conditions, such as fever or infection. Certain medicines, such as medicines that remove excess fluid from the body (diuretics). Lack of safe drinking water. Not being able to get enough water and food. What increases the risk? The following factors may make you more likely to develop this condition: Having a long-term (chronic) illness that has not been treated properly, such as diabetes, heart disease, or kidney disease. Being 65 years of age or older. Having a disability. Living in a place that is high in altitude, where thinner, drier air causes more fluid loss. Doing exercises that put stress on your body for a long time (endurance sports). What are the signs or symptoms? Symptoms of dehydration depend on how severe it is. Mild or moderate dehydration Thirst. Dry lips or dry mouth. Dizziness or light-headedness, especially when standing up from a seated position. Muscle cramps. Dark urine. Urine may be the color of tea. Less urine or tears produced than usual. Headache. Severe dehydration Changes in skin. Your skin may be cold and clammy, blotchy, or pale. Your skin also may not return to normal after being lightly pinched and released. Little or  no tears, urine, or sweat. Changes in vital signs, such as rapid breathing and low blood pressure. Your pulse may be weak or may be faster than 100 beats a minute when you are sitting still. Other changes, such as: Feeling very thirsty. Sunken eyes. Cold hands and feet. Confusion. Being very tired (lethargic) or having trouble waking from sleep. Short-term weight loss. Loss of consciousness. How is this diagnosed? This condition is diagnosed based on your symptoms and a physical exam. You may have blood and urine tests to help confirm the diagnosis. How is this treated? Treatment for this condition depends on how severe it is. Treatment should be started right away. Do not wait until dehydration becomes severe. Severe dehydration is an emergency and needs to be treated in a hospital. Mild or moderate dehydration can be treated at home. You may be asked to: Drink more fluids. Drink an oral rehydration solution (ORS). This drink helps restore proper amounts of fluids and salts and minerals in the blood (electrolytes). Severe dehydration can be treated: With IV fluids. By correcting abnormal levels of electrolytes. This is often done by giving electrolytes through a tube that is passed through your nose and into your stomach (nasogastric tube, or NG tube). By treating the underlying cause of dehydration. Follow these instructions at home: Oral rehydration solution If told by your health care provider, drink an ORS: Make an ORS by following instructions on the package. Start by drinking small amounts, about  cup (120 mL) every 5-10 minutes. Slowly increase how much you drink until you have taken the amount recommended by your health   care provider. Eating and drinking        Drink enough clear fluid to keep your urine pale yellow. If you were told to drink an ORS, finish the ORS first and then start slowly drinking other clear fluids. Drink fluids such as: Water. Do not drink only  water. Doing that can lead to hyponatremia, which is having too little salt (sodium) in the body. Water from ice chips you suck on. Fruit juice that you have added water to (diluted fruit juice). Low-calorie sports drinks. Eat foods that contain a healthy balance of electrolytes, such as bananas, oranges, potatoes, tomatoes, and spinach. Do not drink alcohol. Avoid the following: Drinks that contain a lot of sugar. These include high-calorie sports drinks, fruit juice that is not diluted, and soda. Caffeine. Foods that are greasy or contain a lot of fat or sugar. General instructions Take over-the-counter and prescription medicines only as told by your health care provider. Do not take sodium tablets. Doing that can lead to having too much sodium in the body (hypernatremia). Return to your normal activities as told by your health care provider. Ask your health care provider what activities are safe for you. Keep all follow-up visits as told by your health care provider. This is important. Contact a health care provider if: You have muscle cramps, pain, or discomfort, such as: Pain in your abdomen and the pain gets worse or stays in one area (localizes). Stiff neck. You have a rash. You are more irritable than usual. You are sleepier or have a harder time waking than usual. You feel weak or dizzy. You feel very thirsty. Get help right away if you have: Any symptoms of severe dehydration. Symptoms of vomiting, such as: You cannot eat or drink without vomiting. Vomiting gets worse or does not go away. Vomit includes blood or green matter (bile). Symptoms that get worse with treatment. A fever. A severe headache. Problems with urination or bowel movements, such as: Diarrhea that gets worse or does not go away. Blood in your stool (feces). This may cause stool to look black and tarry. Not urinating, or urinating only a small amount of very dark urine, within 6-8 hours. Trouble  breathing. These symptoms may represent a serious problem that is an emergency. Do not wait to see if the symptoms will go away. Get medical help right away. Call your local emergency services (911 in the [place] Do not drive yourself to the hospital. Summary Dehydration is a condition in which there is not enough water or other fluids in the body. This happens when a person loses more fluids than he or she takes in. Treatment for this condition depends on how severe it is. Treatment should be started right away. Do not wait until dehydration becomes severe. Drink enough clear fluid to keep your urine pale yellow. If you were told to drink an oral rehydration solution (ORS), finish the ORS first and then start slowly drinking other clear fluids. Take over-the-counter and prescription medicines only as told by your health care provider. Get help right away if you have any symptoms of severe dehydration. This information is not intended to replace advice given to you by your health care provider. Make sure you discuss any questions you have with your health care provider. Document Revised: 06/06/2021 Document Reviewed: 09/10/2018 Elsevier Patient Education  2023 [employer]  

## 2021-12-04 ENCOUNTER — Encounter: Payer: Self-pay | Admitting: Oncology

## 2021-12-09 ENCOUNTER — Other Ambulatory Visit: Payer: Self-pay | Admitting: Oncology

## 2021-12-09 DIAGNOSIS — C251 Malignant neoplasm of body of pancreas: Secondary | ICD-10-CM

## 2021-12-12 ENCOUNTER — Inpatient Hospital Stay: Payer: BC Managed Care – PPO | Attending: Oncology | Admitting: Oncology

## 2021-12-12 ENCOUNTER — Other Ambulatory Visit: Payer: Self-pay | Admitting: *Deleted

## 2021-12-12 ENCOUNTER — Inpatient Hospital Stay: Payer: BC Managed Care – PPO

## 2021-12-12 ENCOUNTER — Encounter: Payer: Self-pay | Admitting: *Deleted

## 2021-12-12 VITALS — BP 115/74 | HR 90 | Temp 98.2°F | Resp 18 | Ht 70.0 in | Wt 111.0 lb

## 2021-12-12 DIAGNOSIS — C251 Malignant neoplasm of body of pancreas: Secondary | ICD-10-CM

## 2021-12-12 DIAGNOSIS — Z23 Encounter for immunization: Secondary | ICD-10-CM | POA: Insufficient documentation

## 2021-12-12 DIAGNOSIS — Z79899 Other long term (current) drug therapy: Secondary | ICD-10-CM | POA: Insufficient documentation

## 2021-12-12 DIAGNOSIS — Z5111 Encounter for antineoplastic chemotherapy: Secondary | ICD-10-CM | POA: Insufficient documentation

## 2021-12-12 DIAGNOSIS — C787 Secondary malignant neoplasm of liver and intrahepatic bile duct: Secondary | ICD-10-CM | POA: Insufficient documentation

## 2021-12-12 LAB — CBC WITH DIFFERENTIAL (CANCER CENTER ONLY)
Abs Immature Granulocytes: 0.32 10*3/uL — ABNORMAL HIGH (ref 0.00–0.07)
Basophils Absolute: 0 10*3/uL (ref 0.0–0.1)
Basophils Relative: 0 %
Eosinophils Absolute: 0.1 10*3/uL (ref 0.0–0.5)
Eosinophils Relative: 1 %
HCT: 30.9 % — ABNORMAL LOW (ref 36.0–46.0)
Hemoglobin: 10 g/dL — ABNORMAL LOW (ref 12.0–15.0)
Immature Granulocytes: 2 %
Lymphocytes Relative: 13 %
Lymphs Abs: 1.7 10*3/uL (ref 0.7–4.0)
MCH: 32.5 pg (ref 26.0–34.0)
MCHC: 32.4 g/dL (ref 30.0–36.0)
MCV: 100.3 fL — ABNORMAL HIGH (ref 80.0–100.0)
Monocytes Absolute: 0.7 10*3/uL (ref 0.1–1.0)
Monocytes Relative: 5 %
Neutro Abs: 10.4 10*3/uL — ABNORMAL HIGH (ref 1.7–7.7)
Neutrophils Relative %: 79 %
Platelet Count: 124 10*3/uL — ABNORMAL LOW (ref 150–400)
RBC: 3.08 MIL/uL — ABNORMAL LOW (ref 3.87–5.11)
RDW: 15.7 % — ABNORMAL HIGH (ref 11.5–15.5)
WBC Count: 13.2 10*3/uL — ABNORMAL HIGH (ref 4.0–10.5)
nRBC: 0 % (ref 0.0–0.2)

## 2021-12-12 LAB — CMP (CANCER CENTER ONLY)
ALT: 21 U/L (ref 0–44)
AST: 21 U/L (ref 15–41)
Albumin: 4 g/dL (ref 3.5–5.0)
Alkaline Phosphatase: 129 U/L — ABNORMAL HIGH (ref 38–126)
Anion gap: 9 (ref 5–15)
BUN: 13 mg/dL (ref 6–20)
CO2: 27 mmol/L (ref 22–32)
Calcium: 9.3 mg/dL (ref 8.9–10.3)
Chloride: 105 mmol/L (ref 98–111)
Creatinine: 0.65 mg/dL (ref 0.44–1.00)
GFR, Estimated: >60 mL/min (ref >60)
Glucose, Bld: 119 mg/dL — ABNORMAL HIGH (ref 70–99)
Potassium: 3.9 mmol/L (ref 3.5–5.1)
Sodium: 141 mmol/L (ref 135–145)
Total Bilirubin: 0.2 mg/dL — ABNORMAL LOW (ref 0.3–1.2)
Total Protein: 6.8 g/dL (ref 6.5–8.1)

## 2021-12-12 MED ORDER — LORAZEPAM 1 MG PO TABS: 1.0000 mg | ORAL_TABLET | ORAL | 0 refills | Status: DC

## 2021-12-12 MED ORDER — SODIUM CHLORIDE 0.9 % IV SOLN
400.0000 mg/m2 | Freq: Once | INTRAVENOUS | Status: AC
  Administered 2021-12-12: 624 mg via INTRAVENOUS
  Filled 2021-12-12: qty 31.2

## 2021-12-12 MED ORDER — SODIUM CHLORIDE 0.9 % IV SOLN
2400.0000 mg/m2 | INTRAVENOUS | Status: DC
  Administered 2021-12-12: 3750 mg via INTRAVENOUS
  Filled 2021-12-12: qty 75

## 2021-12-12 MED ORDER — TRAMADOL HCL 50 MG PO TABS: 50.0000 mg | ORAL_TABLET | Freq: Four times a day (QID) | ORAL | 0 refills | Status: DC | PRN

## 2021-12-12 MED ORDER — SODIUM CHLORIDE 0.9 % IV SOLN
150.0000 mg/m2 | Freq: Once | INTRAVENOUS | Status: AC
  Administered 2021-12-12: 240 mg via INTRAVENOUS
  Filled 2021-12-12: qty 10

## 2021-12-12 MED ORDER — SERTRALINE HCL 25 MG PO TABS: 25.0000 mg | ORAL_TABLET | Freq: Every day | ORAL | 0 refills | Status: DC

## 2021-12-12 MED ORDER — SODIUM CHLORIDE 0.9% FLUSH
10.0000 mL | INTRAVENOUS | Status: DC | PRN
  Administered 2021-12-12: 10 mL

## 2021-12-12 MED ORDER — ATROPINE SULFATE 1 MG/ML IV SOLN
0.5000 mg | Freq: Once | INTRAVENOUS | Status: AC | PRN
  Administered 2021-12-12: 0.5 mg via INTRAVENOUS
  Filled 2021-12-12: qty 1

## 2021-12-12 MED ORDER — SODIUM CHLORIDE 0.9 % IV SOLN
150.0000 mg | Freq: Once | INTRAVENOUS | Status: AC
  Administered 2021-12-12: 150 mg via INTRAVENOUS
  Filled 2021-12-12: qty 5

## 2021-12-12 MED ORDER — PALONOSETRON HCL INJECTION 0.25 MG/5ML
0.2500 mg | Freq: Once | INTRAVENOUS | Status: AC
  Administered 2021-12-12: 0.25 mg via INTRAVENOUS
  Filled 2021-12-12: qty 5

## 2021-12-12 MED ORDER — INFLUENZA VAC SPLIT QUAD 0.5 ML IM SUSY
0.5000 mL | PREFILLED_SYRINGE | Freq: Once | INTRAMUSCULAR | Status: AC
  Administered 2021-12-12: 0.5 mL via INTRAMUSCULAR
  Filled 2021-12-12: qty 0.5

## 2021-12-12 MED ORDER — SODIUM CHLORIDE 0.9 % IV SOLN
10.0000 mg | Freq: Once | INTRAVENOUS | Status: AC
  Administered 2021-12-12: 10 mg via INTRAVENOUS
  Filled 2021-12-12: qty 10

## 2021-12-12 MED ORDER — SODIUM CHLORIDE 0.9 % IV SOLN: Freq: Once | INTRAVENOUS | Status: AC

## 2021-12-12 NOTE — Patient Instructions (Signed)

## 2021-12-12 NOTE — Progress Notes (Signed)
Patient seen by Dr. Benay Spice today  Vitals are within treatment parameters.  Labs reviewed by Dr. Benay Spice and are within treatment parameters.  Per physician team, patient is ready for treatment. Please note that modifications are being made to the treatment plan including Removal of oxaliplatin  from plan   Patient requesting flu vaccine today.

## 2021-12-12 NOTE — Progress Notes (Signed)
[facility] OFFICE PROGRESS NOTE   Diagnosis: Pancreas cancer  INTERVAL HISTORY:   Tara Jensen completed another cycle FOLFIRINOX on 11/28/2021.  She had cold sensitivity lasting approximately 10 days following chemotherapy.  She has tingling and numbness in the hands.  She had difficulty opening mail.  The numbness does not interfere with activity at present.  No nausea/vomiting, mouth sores, or diarrhea.  Abdominal pain remains improved.  She takes approximate 200 mg of tramadol daily. She is exercising.  Good appetite. She is scheduled for an appointment at the [facility] in 2 weeks.  Objective:  Vital signs in last 24 hours:  Blood pressure 115/74, pulse 90, temperature 98.2 F (36.8 C), temperature source Oral, resp. rate 18, height 5' 10" (1.778 m), weight 111 lb (50.3 kg), last menstrual period 11/01/2018, SpO2 99 %.    HEENT: No thrush or ulcers Resp: Lungs clear bilaterally Cardio: Regular rate and rhythm GI: Nontender, no mass, no hepatosplenomegaly Vascular: No leg edema Neuro: Mild to moderate loss of vibratory sense at the fingertips bilaterally    Portacath/PICC-without erythema  Lab Results:  Lab Results  Component Value Date   WBC 13.2 (H) 12/12/2021   HGB 10.0 (L) 12/12/2021   HCT 30.9 (L) 12/12/2021   MCV 100.3 (H) 12/12/2021   PLT 124 (L) 12/12/2021   NEUTROABS 10.4 (H) 12/12/2021    CMP  Lab Results  Component Value Date   NA 140 11/28/2021   K 4.0 11/28/2021   CL 104 11/28/2021   CO2 28 11/28/2021   GLUCOSE 116 (H) 11/28/2021   BUN 18 11/28/2021   CREATININE 0.62 11/28/2021   CALCIUM 9.2 11/28/2021   PROT 6.6 11/28/2021   ALBUMIN 4.1 11/28/2021   AST 18 11/28/2021   ALT 18 11/28/2021   ALKPHOS 119 11/28/2021   BILITOT 0.2 (L) 11/28/2021   GFRNONAA >60 11/28/2021   GFRAA 76 09/08/2019    Lab Results  Component Value Date   CAN199 391 (H) 11/28/2021     Medications: I have reviewed the patient's current  medications.   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  Less than 180 degree abutment of the distal celiac, common hepatic, and splenic arteries, less than 180 degree abutment of the SMA CT chest 07/18/2021-no evidence of metastatic disease EUS 07/16/2021-extrinsic impression on the stomach at the posterior wall of the gastric body, no gross lesion in the duodenum, 45 x 36 mm pancreas body mass, abutment of the SMA, celiac trunk, and compression of the splenoportal confluence.  No malignant appearing lymph nodes, numerous venous collaterals adjacent to the portal vein, FNA biopsy-malignant cells consistent with adenocarcinoma, T4N0 by EUS Markedly elevated CA 19-9 Guardant360 07/31/2021-K-ras G12R, MSI high-not detected, ATM VUS Cycle 1 FOLFOX 08/08/2021 Cycle 2 FOLFIRINOX 08/22/2021 CT abdomen/pelvis at [facility] 08/31/2021-hypoattenuating liver lesions consistent with metastases, mass in the pancreas neck/body with greater than 100 degree encasement of the celiac axis and common hepatic artery.  Less than 180 degree abutment of the SMA, long segment occlusion of the portal splenic confluence, splenic vein occluded, multiple collateral vessels in the upper abdomen, prominent left periaortic node Cycle 3 FOLFIRINOX 09/05/2021 Cycle 4 FOLFIRINOX 09/19/2021 Cycle 5 FOLFIRINOX 10/03/2021 CTs 10/16/2021-decrease size of primary pancreas mass, hepatic metastases, and a suspicious retroperitoneal node, resolution of left supraclavicular/low jugular adenopathy compared to a CT   chest 07/18/2021.  No evidence of disease progression. Cycle 6 FOLFIRINOX 10/17/2021 Cycle 7 FOLFIRINOX 10/31/2021 Cycle 8 FOLFIRINOX 11/14/2021 Cycle 9 FOLFIRINOX 11/28/2021 Cycle 10 FOLFIRINOX 12/12/2021, oxaliplatin held secondary  to neuropathy symptoms Pain secondary to #1-controlled with tramadol and Tylenol. 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  Her clinical status and the CA 19-9 have improved.  She will complete cycle 10 today.  Oxaliplatin will be held due to neuropathy symptoms.  She will undergo restaging CT evaluation 12/20/2021.  She is scheduled for an appointment at the [facility] in 2 weeks.  She will return for an office visit with the plan to resume systemic therapy on 01/09/2022.  I refilled her prescription for tramadol.  Betsy Coder, MD  12/12/2021  9:45 AM

## 2021-12-12 NOTE — Patient Instructions (Addendum)
[place]  The chemotherapy medication bag should finish at 46 hours, 96 hours, or 7 days. For example, if your pump is scheduled for 46 hours and it was put on at 4:00 p.m., it should finish at 2:00 p.m. the day it is scheduled to come off regardless of your appointment time.     Estimated time to finish at 11am Friday, November 3rd, 2023.   If the display on your pump reads "Low Volume" and it is beeping, take the batteries out of the pump and come to the cancer center for it to be taken off.   If the pump alarms go off prior to the pump reading "Low Volume" then call [phone] and someone can assist you.  If the plunger comes out and the chemotherapy medication is leaking out, please use your home chemo spill kit to clean up the spill. Do NOT use paper towels or other household products.  If you have problems or questions regarding your pump, please call either [phone] (24 hours a day) or the cancer center Monday-Friday 8:00 a.m.- 4:30 p.m. at the clinic number and we will assist you. If you are unable to get assistance, then go to the nearest Emergency Department and ask the staff to contact the IV team for assistance.   Discharge Instructions: Thank you for choosing [facility] to provide your oncology and hematology care.   If you have a lab appointment with the [facility], please go directly to the [facility] and check in at the registration area.   Wear comfortable clothing and clothing appropriate for easy access to any Portacath or PICC line.   We strive to give you quality time with your provider. You may need to reschedule your appointment if you arrive late (15 or more minutes).  Arriving late affects you and other patients whose appointments are after yours.  Also, if you miss three or more appointments without notifying the office, you may be dismissed from the clinic at the provider's discretion.      For prescription  refill requests, have your pharmacy contact our office and allow 72 hours for refills to be completed.    Today you received the following chemotherapy and/or immunotherapy agents Oxaliplatin,Irinotecan, Leucovorin, Fluorouracil      To help prevent nausea and vomiting after your treatment, we encourage you to take your nausea medication as directed.  BELOW ARE SYMPTOMS THAT SHOULD BE REPORTED IMMEDIATELY: *FEVER GREATER THAN 100.4 F (38 C) OR HIGHER *CHILLS OR SWEATING *NAUSEA AND VOMITING THAT IS NOT CONTROLLED WITH YOUR NAUSEA MEDICATION *UNUSUAL SHORTNESS OF BREATH *UNUSUAL BRUISING OR BLEEDING *URINARY PROBLEMS (pain or burning when urinating, or frequent urination) *BOWEL PROBLEMS (unusual diarrhea, constipation, pain near the anus) TENDERNESS IN MOUTH AND THROAT WITH OR WITHOUT PRESENCE OF ULCERS (sore throat, sores in mouth, or a toothache) UNUSUAL RASH, SWELLING OR PAIN  UNUSUAL VAGINAL DISCHARGE OR ITCHING   Items with * indicate a potential emergency and should be followed up as soon as possible or go to the Emergency Department if any problems should occur.  Please show the CHEMOTHERAPY ALERT CARD or IMMUNOTHERAPY ALERT CARD at check-in to the Emergency Department and triage nurse.  Should you have questions after your visit or need to cancel or reschedule your appointment, please contact [facility]  Dept: [phone]  and follow the prompts.  Office hours are 8:00 a.m. to 4:30 p.m. Monday - Friday. Please note that voicemails  left after 4:00 p.m. may not be returned until the following business day.  We are closed weekends and major holidays. You have access to a nurse at all times for urgent questions. Please call the main number to the clinic Dept: [phone] and follow the prompts.   For any non-urgent questions, you may also contact your provider using MyChart. We now offer e-Visits for anyone 51 and older to request care online for  non-urgent symptoms. For details visit [email or web address].   Also download the MyChart app! Go to the app store, search "MyChart", open the app, select [facility], and log in with your MyChart username and password.  Masks are optional in the cancer centers. If you would like for your care team to wear a mask while they are taking care of you, please let them know. For doctor visits, patients may have with them one support person who is at least 54 years old. At this time, visitors are not allowed in the infusion area.  Irinotecan injection What is this medication? IRINOTECAN (ir in oh TEE kan ) is a chemotherapy drug. It is used to treat colon and rectal cancer. This medicine may be used for other purposes; ask your health care provider or pharmacist if you have questions. COMMON BRAND NAME(S): Camptosar What should I tell my care team before I take this medication? They need to know if you have any of these conditions: dehydration diarrhea infection (especially a virus infection such as chickenpox, cold sores, or herpes) liver disease low blood counts, like low white cell, platelet, or red cell counts low levels of calcium, magnesium, or potassium in the blood recent or ongoing radiation therapy an unusual or allergic reaction to irinotecan, other medicines, foods, dyes, or preservatives pregnant or trying to get pregnant breast-feeding How should I use this medication? This drug is given as an infusion into a vein. It is administered in a hospital or clinic by a specially trained health care professional. Talk to your pediatrician regarding the use of this medicine in children. Special care may be needed. Overdosage: If you think you have taken too much of this medicine contact a poison control center or emergency room at once. NOTE: This medicine is only for you. Do not share this medicine with others. What if I miss a dose? It is important not to miss your dose. Call your  doctor or health care professional if you are unable to keep an appointment. What may interact with this medication? Do not take this medicine with any of the following medications: cobicistat itraconazole This medicine may interact with the following medications: antiviral medicines for HIV or AIDS certain antibiotics like rifampin or rifabutin certain medicines for fungal infections like ketoconazole, posaconazole, and voriconazole certain medicines for seizures like carbamazepine, phenobarbital, phenotoin clarithromycin gemfibrozil nefazodone [facility]. John's Wort This list may not describe all possible interactions. Give your health care provider a list of all the medicines, herbs, non-prescription drugs, or dietary supplements you use. Also tell them if you smoke, drink alcohol, or use illegal drugs. Some items may interact with your medicine. What should I watch for while using this medication? Your condition will be monitored carefully while you are receiving this medicine. You will need important blood work done while you are taking this medicine. This drug may make you feel generally unwell. This is not uncommon, as chemotherapy can affect healthy cells as well as cancer cells. Report any side effects. Continue your course of treatment even though you feel  ill unless your doctor tells you to stop. In some cases, you may be given additional medicines to help with side effects. Follow all directions for their use. You may get drowsy or dizzy. Do not drive, use machinery, or do anything that needs mental alertness until you know how this medicine affects you. Do not stand or sit up quickly, especially if you are an older patient. This reduces the risk of dizzy or fainting spells. Call your health care professional for advice if you get a fever, chills, or sore throat, or other symptoms of a cold or flu. Do not treat yourself. This medicine decreases your body's ability to fight infections. Try to  avoid being around people who are sick. Avoid taking products that contain aspirin, acetaminophen, ibuprofen, naproxen, or ketoprofen unless instructed by your doctor. These medicines may hide a fever. This medicine may increase your risk to bruise or bleed. Call your doctor or health care professional if you notice any unusual bleeding. Be careful brushing and flossing your teeth or using a toothpick because you may get an infection or bleed more easily. If you have any dental work done, tell your dentist you are receiving this medicine. Do not become pregnant while taking this medicine or for 6 months after stopping it. Women should inform their health care professional if they wish to become pregnant or think they might be pregnant. Men should not father a child while taking this medicine and for 3 months after stopping it. There is potential for serious side effects to an unborn child. Talk to your health care professional for more information. Do not breast-feed an infant while taking this medicine or for 7 days after stopping it. This medicine has caused ovarian failure in some women. This medicine may make it more difficult to get pregnant. Talk to your health care professional if you are concerned about your fertility. This medicine has caused decreased sperm counts in some men. This may make it more difficult to father a child. Talk to your health care professional if you are concerned about your fertility. What side effects may I notice from receiving this medication? Side effects that you should report to your doctor or health care professional as soon as possible: allergic reactions like skin rash, itching or hives, swelling of the face, lips, or tongue chest pain diarrhea flushing, runny nose, sweating during infusion low blood counts - this medicine may decrease the number of white blood cells, red blood cells and platelets. You may be at increased risk for infections and  bleeding. nausea, vomiting pain, swelling, warmth in the leg signs of decreased platelets or bleeding - bruising, pinpoint red spots on the skin, black, tarry stools, blood in the urine signs of infection - fever or chills, cough, sore throat, pain or difficulty passing urine signs of decreased red blood cells - unusually weak or tired, fainting spells, lightheadedness Side effects that usually do not require medical attention (report to your doctor or health care professional if they continue or are bothersome): constipation hair loss headache loss of appetite mouth sores stomach pain This list may not describe all possible side effects. Call your doctor for medical advice about side effects. You may report side effects to FDA at 1-800-FDA-1088. Where should I keep my medication? This drug is given in a hospital or clinic and will not be stored at home. NOTE: This sheet is a summary. It may not cover all possible information. If you have questions about this medicine, talk  to your doctor, pharmacist, or health care provider.  2023 Elsevier/Gold Standard (2020-12-29 00:00:00)  Leucovorin injection What is this medication? LEUCOVORIN (loo koe VOR in) is used to prevent or treat the harmful effects of some medicines. This medicine is used to treat anemia caused by a low amount of folic acid in the body. It is also used with 5-fluorouracil (5-FU) to treat colon cancer. This medicine may be used for other purposes; ask your health care provider or pharmacist if you have questions. What should I tell my care team before I take this medication? They need to know if you have any of these conditions: anemia from low levels of vitamin B-12 in the blood an unusual or allergic reaction to leucovorin, folic acid, other medicines, foods, dyes, or preservatives pregnant or trying to get pregnant breast-feeding How should I use this medication? This medicine is for injection into a muscle or into a  vein. It is given by a health care professional in a hospital or clinic setting. Talk to your pediatrician regarding the use of this medicine in children. Special care may be needed. Overdosage: If you think you have taken too much of this medicine contact a poison control center or emergency room at once. NOTE: This medicine is only for you. Do not share this medicine with others. What if I miss a dose? This does not apply. What may interact with this medication? capecitabine fluorouracil phenobarbital phenytoin primidone trimethoprim-sulfamethoxazole This list may not describe all possible interactions. Give your health care provider a list of all the medicines, herbs, non-prescription drugs, or dietary supplements you use. Also tell them if you smoke, drink alcohol, or use illegal drugs. Some items may interact with your medicine. What should I watch for while using this medication? Your condition will be monitored carefully while you are receiving this medicine. This medicine may increase the side effects of 5-fluorouracil, 5-FU. Tell your doctor or health care professional if you have diarrhea or mouth sores that do not get better or that get worse. What side effects may I notice from receiving this medication? Side effects that you should report to your doctor or health care professional as soon as possible: allergic reactions like skin rash, itching or hives, swelling of the face, lips, or tongue breathing problems fever, infection mouth sores unusual bleeding or bruising unusually weak or tired Side effects that usually do not require medical attention (report to your doctor or health care professional if they continue or are bothersome): constipation or diarrhea loss of appetite nausea, vomiting This list may not describe all possible side effects. Call your doctor for medical advice about side effects. You may report side effects to FDA at 1-800-FDA-1088. Where should I keep  my medication? This drug is given in a hospital or clinic and will not be stored at home. NOTE: This sheet is a summary. It may not cover all possible information. If you have questions about this medicine, talk to your doctor, pharmacist, or health care provider.  2023 Elsevier/Gold Standard (2007-08-06 00:00:00)  Fluorouracil, 5-FU injection What is this medication? FLUOROURACIL, 5-FU (flure oh YOOR a sil) is a chemotherapy drug. It slows the growth of cancer cells. This medicine is used to treat many types of cancer like breast cancer, colon or rectal cancer, pancreatic cancer, and stomach cancer. This medicine may be used for other purposes; ask your health care provider or pharmacist if you have questions. COMMON BRAND NAME(S): Adrucil What should I tell my care team before  I take this medication? They need to know if you have any of these conditions: blood disorders dihydropyrimidine dehydrogenase (DPD) deficiency infection (especially a virus infection such as chickenpox, cold sores, or herpes) kidney disease liver disease malnourished, poor nutrition recent or ongoing radiation therapy an unusual or allergic reaction to fluorouracil, other chemotherapy, other medicines, foods, dyes, or preservatives pregnant or trying to get pregnant breast-feeding How should I use this medication? This drug is given as an infusion or injection into a vein. It is administered in a hospital or clinic by a specially trained health care professional. Talk to your pediatrician regarding the use of this medicine in children. Special care may be needed. Overdosage: If you think you have taken too much of this medicine contact a poison control center or emergency room at once. NOTE: This medicine is only for you. Do not share this medicine with others. What if I miss a dose? It is important not to miss your dose. Call your doctor or health care professional if you are unable to keep an  appointment. What may interact with this medication? Do not take this medicine with any of the following medications: live virus vaccines This medicine may also interact with the following medications: medicines that treat or prevent blood clots like warfarin, enoxaparin, and dalteparin This list may not describe all possible interactions. Give your health care provider a list of all the medicines, herbs, non-prescription drugs, or dietary supplements you use. Also tell them if you smoke, drink alcohol, or use illegal drugs. Some items may interact with your medicine. What should I watch for while using this medication? Visit your doctor for checks on your progress. This drug may make you feel generally unwell. This is not uncommon, as chemotherapy can affect healthy cells as well as cancer cells. Report any side effects. Continue your course of treatment even though you feel ill unless your doctor tells you to stop. In some cases, you may be given additional medicines to help with side effects. Follow all directions for their use. Call your doctor or health care professional for advice if you get a fever, chills or sore throat, or other symptoms of a cold or flu. Do not treat yourself. This drug decreases your body's ability to fight infections. Try to avoid being around people who are sick. This medicine may increase your risk to bruise or bleed. Call your doctor or health care professional if you notice any unusual bleeding. Be careful brushing and flossing your teeth or using a toothpick because you may get an infection or bleed more easily. If you have any dental work done, tell your dentist you are receiving this medicine. Avoid taking products that contain aspirin, acetaminophen, ibuprofen, naproxen, or ketoprofen unless instructed by your doctor. These medicines may hide a fever. Do not become pregnant while taking this medicine. Women should inform their doctor if they wish to become pregnant  or think they might be pregnant. There is a potential for serious side effects to an unborn child. Talk to your health care professional or pharmacist for more information. Do not breast-feed an infant while taking this medicine. Men should inform their doctor if they wish to father a child. This medicine may lower sperm counts. Do not treat diarrhea with over the counter products. Contact your doctor if you have diarrhea that lasts more than 2 days or if it is severe and watery. This medicine can make you more sensitive to the sun. Keep out of the sun.  If you cannot avoid being in the sun, wear protective clothing and use sunscreen. Do not use sun lamps or tanning beds/booths. What side effects may I notice from receiving this medication? Side effects that you should report to your doctor or health care professional as soon as possible: allergic reactions like skin rash, itching or hives, swelling of the face, lips, or tongue low blood counts - this medicine may decrease the number of white blood cells, red blood cells and platelets. You may be at increased risk for infections and bleeding. signs of infection - fever or chills, cough, sore throat, pain or difficulty passing urine signs of decreased platelets or bleeding - bruising, pinpoint red spots on the skin, black, tarry stools, blood in the urine signs of decreased red blood cells - unusually weak or tired, fainting spells, lightheadedness breathing problems changes in vision chest pain mouth sores nausea and vomiting pain, swelling, redness at site where injected pain, tingling, numbness in the hands or feet redness, swelling, or sores on hands or feet stomach pain unusual bleeding Side effects that usually do not require medical attention (report to your doctor or health care professional if they continue or are bothersome): changes in finger or toe nails diarrhea dry or itchy skin hair loss headache loss of appetite sensitivity  of eyes to the light stomach upset unusually teary eyes This list may not describe all possible side effects. Call your doctor for medical advice about side effects. You may report side effects to FDA at 1-800-FDA-1088. Where should I keep my medication? This drug is given in a hospital or clinic and will not be stored at home. NOTE: This sheet is a summary. It may not cover all possible information. If you have questions about this medicine, talk to your doctor, pharmacist, or health care provider.  2023 Elsevier/Gold Standard (2020-12-29 00:00:00)

## 2021-12-13 ENCOUNTER — Other Ambulatory Visit: Payer: Self-pay

## 2021-12-13 ENCOUNTER — Encounter: Payer: Self-pay | Admitting: Oncology

## 2021-12-13 LAB — CANCER ANTIGEN 19-9: CA 19-9: 354 U/mL — ABNORMAL HIGH (ref 0–35)

## 2021-12-14 ENCOUNTER — Inpatient Hospital Stay: Payer: BC Managed Care – PPO

## 2021-12-14 VITALS — BP 116/77 | HR 66 | Temp 98.2°F | Resp 18

## 2021-12-14 DIAGNOSIS — C251 Malignant neoplasm of body of pancreas: Secondary | ICD-10-CM

## 2021-12-14 MED ORDER — HEPARIN SOD (PORK) LOCK FLUSH 100 UNIT/ML IV SOLN: 500.0000 [IU] | Freq: Once | INTRAVENOUS | Status: DC | PRN

## 2021-12-14 MED ORDER — PEGFILGRASTIM-CBQV 6 MG/0.6ML ~~LOC~~ SOSY
6.0000 mg | PREFILLED_SYRINGE | Freq: Once | SUBCUTANEOUS | Status: AC
  Administered 2021-12-14: 6 mg via SUBCUTANEOUS
  Filled 2021-12-14: qty 0.6

## 2021-12-14 MED ORDER — SODIUM CHLORIDE 0.9% FLUSH: 10.0000 mL | INTRAVENOUS | Status: DC | PRN

## 2021-12-14 MED ORDER — SODIUM CHLORIDE 0.9 % IV SOLN: INTRAVENOUS | Status: AC

## 2021-12-14 NOTE — Patient Instructions (Signed)

## 2021-12-20 ENCOUNTER — Ambulatory Visit (HOSPITAL_BASED_OUTPATIENT_CLINIC_OR_DEPARTMENT_OTHER)
Admission: RE | Admit: 2021-12-20 | Discharge: 2021-12-20 | Disposition: A | Discharge status: Home / Self Care | Payer: BC Managed Care – PPO | Source: Ambulatory Visit | Attending: Nurse Practitioner | Admitting: Nurse Practitioner

## 2021-12-20 ENCOUNTER — Inpatient Hospital Stay: Payer: BC Managed Care – PPO

## 2021-12-20 DIAGNOSIS — C251 Malignant neoplasm of body of pancreas: Secondary | ICD-10-CM | POA: Diagnosis present

## 2021-12-20 MED ORDER — IOHEXOL 9 MG/ML PO SOLN: 500.0000 mL | ORAL | Status: AC

## 2021-12-20 MED ORDER — IOHEXOL 300 MG/ML  SOLN
100.0000 mL | Freq: Once | INTRAMUSCULAR | Status: AC | PRN
  Administered 2021-12-20: 100 mL via INTRAVENOUS

## 2021-12-20 NOTE — Patient Instructions (Signed)

## 2021-12-24 ENCOUNTER — Encounter: Payer: Self-pay | Admitting: *Deleted

## 2021-12-24 ENCOUNTER — Telehealth: Payer: Self-pay | Admitting: Nurse Practitioner

## 2021-12-24 NOTE — Telephone Encounter (Signed)
I contacted Ms. Apachito to review the CT results.  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  She was scheduled to be seen at the [facility] later this week.  She was contacted by the [facility] and the appointment has been canceled due to the significant response in the liver.  We will work to get her scheduled for chemotherapy this week.

## 2021-12-24 NOTE — Progress Notes (Signed)
PATIENT NAVIGATOR PROGRESS NOTE  Name: Tara Jensen Date: 12/24/2021 MRN: [id number]  DOB: March 26, 1965   Reason for visit:  Report faxed  Comments:  CT report from 12/22/21 faxed to [facility].     Time spent counseling/coordinating care: 30-45 minutes

## 2021-12-26 ENCOUNTER — Other Ambulatory Visit: Payer: BC Managed Care – PPO

## 2021-12-26 ENCOUNTER — Ambulatory Visit: Payer: BC Managed Care – PPO | Admitting: Oncology

## 2021-12-26 ENCOUNTER — Ambulatory Visit: Payer: BC Managed Care – PPO

## 2021-12-27 ENCOUNTER — Inpatient Hospital Stay: Payer: BC Managed Care – PPO

## 2021-12-27 ENCOUNTER — Telehealth: Payer: Self-pay

## 2021-12-27 ENCOUNTER — Inpatient Hospital Stay (HOSPITAL_BASED_OUTPATIENT_CLINIC_OR_DEPARTMENT_OTHER): Payer: BC Managed Care – PPO | Admitting: Oncology

## 2021-12-27 VITALS — BP 105/62

## 2021-12-27 VITALS — BP 126/68 | HR 89 | Temp 98.1°F | Resp 18 | Ht 70.0 in | Wt 110.6 lb

## 2021-12-27 DIAGNOSIS — R110 Nausea: Secondary | ICD-10-CM

## 2021-12-27 DIAGNOSIS — C251 Malignant neoplasm of body of pancreas: Secondary | ICD-10-CM | POA: Diagnosis not present

## 2021-12-27 LAB — CBC WITH DIFFERENTIAL (CANCER CENTER ONLY)
Abs Immature Granulocytes: 0.13 10*3/uL — ABNORMAL HIGH (ref 0.00–0.07)
Basophils Absolute: 0 10*3/uL (ref 0.0–0.1)
Basophils Relative: 0 %
Eosinophils Absolute: 0.2 10*3/uL (ref 0.0–0.5)
Eosinophils Relative: 1 %
HCT: 33.2 % — ABNORMAL LOW (ref 36.0–46.0)
Hemoglobin: 10.4 g/dL — ABNORMAL LOW (ref 12.0–15.0)
Immature Granulocytes: 1 %
Lymphocytes Relative: 15 %
Lymphs Abs: 1.8 10*3/uL (ref 0.7–4.0)
MCH: 31.8 pg (ref 26.0–34.0)
MCHC: 31.3 g/dL (ref 30.0–36.0)
MCV: 101.5 fL — ABNORMAL HIGH (ref 80.0–100.0)
Monocytes Absolute: 0.6 10*3/uL (ref 0.1–1.0)
Monocytes Relative: 6 %
Neutro Abs: 8.8 10*3/uL — ABNORMAL HIGH (ref 1.7–7.7)
Neutrophils Relative %: 77 %
Platelet Count: 153 10*3/uL (ref 150–400)
RBC: 3.27 MIL/uL — ABNORMAL LOW (ref 3.87–5.11)
RDW: 15.5 % (ref 11.5–15.5)
WBC Count: 11.5 10*3/uL — ABNORMAL HIGH (ref 4.0–10.5)
nRBC: 0 % (ref 0.0–0.2)

## 2021-12-27 LAB — CMP (CANCER CENTER ONLY)
ALT: 16 U/L (ref 0–44)
AST: 20 U/L (ref 15–41)
Albumin: 4.1 g/dL (ref 3.5–5.0)
Alkaline Phosphatase: 118 U/L (ref 38–126)
Anion gap: 8 (ref 5–15)
BUN: 14 mg/dL (ref 6–20)
CO2: 28 mmol/L (ref 22–32)
Calcium: 9.5 mg/dL (ref 8.9–10.3)
Chloride: 105 mmol/L (ref 98–111)
Creatinine: 0.63 mg/dL (ref 0.44–1.00)
GFR, Estimated: >60 mL/min (ref >60)
Glucose, Bld: 79 mg/dL (ref 70–99)
Potassium: 4.1 mmol/L (ref 3.5–5.1)
Sodium: 141 mmol/L (ref 135–145)
Total Bilirubin: 0.3 mg/dL (ref 0.3–1.2)
Total Protein: 7 g/dL (ref 6.5–8.1)

## 2021-12-27 MED ORDER — SODIUM CHLORIDE 0.9 % IV SOLN
10.0000 mg | Freq: Once | INTRAVENOUS | Status: AC
  Administered 2021-12-27: 10 mg via INTRAVENOUS
  Filled 2021-12-27: qty 10

## 2021-12-27 MED ORDER — SODIUM CHLORIDE 0.9 % IV SOLN: INTRAVENOUS | Status: DC

## 2021-12-27 MED ORDER — SODIUM CHLORIDE 0.9% FLUSH
10.0000 mL | INTRAVENOUS | Status: DC | PRN
  Administered 2021-12-27: 10 mL

## 2021-12-27 MED ORDER — PALONOSETRON HCL INJECTION 0.25 MG/5ML
0.2500 mg | Freq: Once | INTRAVENOUS | Status: AC
  Administered 2021-12-27: 0.25 mg via INTRAVENOUS
  Filled 2021-12-27: qty 5

## 2021-12-27 MED ORDER — SODIUM CHLORIDE 0.9 % IV SOLN
150.0000 mg | Freq: Once | INTRAVENOUS | Status: AC
  Administered 2021-12-27: 150 mg via INTRAVENOUS
  Filled 2021-12-27: qty 150

## 2021-12-27 MED ORDER — SODIUM CHLORIDE 0.9 % IV SOLN
2400.0000 mg/m2 | INTRAVENOUS | Status: DC
  Administered 2021-12-27: 3750 mg via INTRAVENOUS
  Filled 2021-12-27: qty 50

## 2021-12-27 MED ORDER — SODIUM CHLORIDE 0.9 % IV SOLN
400.0000 mg/m2 | Freq: Once | INTRAVENOUS | Status: AC
  Administered 2021-12-27: 624 mg via INTRAVENOUS
  Filled 2021-12-27: qty 31.2

## 2021-12-27 MED ORDER — SODIUM CHLORIDE 0.9 % IV SOLN
150.0000 mg/m2 | Freq: Once | INTRAVENOUS | Status: AC
  Administered 2021-12-27: 240 mg via INTRAVENOUS
  Filled 2021-12-27: qty 2

## 2021-12-27 MED ORDER — DEXAMETHASONE 4 MG PO TABS: 4.0000 mg | ORAL_TABLET | Freq: Two times a day (BID) | ORAL | 0 refills | Status: DC

## 2021-12-27 MED ORDER — ATROPINE SULFATE 1 MG/ML IV SOLN
0.5000 mg | Freq: Once | INTRAVENOUS | Status: AC | PRN
  Administered 2021-12-27: 0.5 mg via INTRAVENOUS
  Filled 2021-12-27: qty 1

## 2021-12-27 MED ORDER — SODIUM CHLORIDE 0.9 % IV SOLN: Freq: Once | INTRAVENOUS | Status: AC

## 2021-12-27 MED ORDER — SODIUM CHLORIDE 0.9 % IV SOLN
400.0000 mg/m2 | Freq: Once | INTRAVENOUS | Status: DC
  Filled 2021-12-27: qty 31.2

## 2021-12-27 NOTE — Patient Instructions (Addendum)
[facility]  The chemotherapy medication bag should finish at 46 hours, 96 hours, or 7 days. For example, if your pump is scheduled for 46 hours and it was put on at 4:00 p.m., it should finish at 2:00 p.m. the day it is scheduled to come off regardless of your appointment time.     Estimated time to finish at 12pm Saturday, November 18th, 2023 at [facility].  You will receive fluids on the day of your pump stop.  Your appointment is scheduled for 11am on 12/29/21.    If the display on your pump reads "Low Volume" and it is beeping, take the batteries out of the pump and come to the cancer center for it to be taken off.   If the pump alarms go off prior to the pump reading "Low Volume" then call [phone] and someone can assist you.  If the plunger comes out and the chemotherapy medication is leaking out, please use your home chemo spill kit to clean up the spill. Do NOT use paper towels or other household products.  If you have problems or questions regarding your pump, please call either [phone] (24 hours a day) or the cancer center Monday-Friday 8:00 a.m.- 4:30 p.m. at the clinic number and we will assist you. If you are unable to get assistance, then go to the nearest Emergency Department and ask the staff to contact the IV team for assistance.   Discharge Instructions: Thank you for choosing [facility] to provide your oncology and hematology care.   If you have a lab appointment with the [facility], please go directly to the [facility] and check in at the registration area.   Wear comfortable clothing and clothing appropriate for easy access to any Portacath or PICC line.   We strive to give you quality time with your provider. You may need to reschedule your appointment if you arrive late (15 or more minutes).  Arriving late affects you and other patients whose appointments are after yours.  Also, if you miss three or more  appointments without notifying the office, you may be dismissed from the clinic at the provider's discretion.      For prescription refill requests, have your pharmacy contact our office and allow 72 hours for refills to be completed.    Today you received the following chemotherapy and/or immunotherapy agents Oxaliplatin,Irinotecan, Leucovorin, Fluorouracil      To help prevent nausea and vomiting after your treatment, we encourage you to take your nausea medication as directed.  BELOW ARE SYMPTOMS THAT SHOULD BE REPORTED IMMEDIATELY: *FEVER GREATER THAN 100.4 F (38 C) OR HIGHER *CHILLS OR SWEATING *NAUSEA AND VOMITING THAT IS NOT CONTROLLED WITH YOUR NAUSEA MEDICATION *UNUSUAL SHORTNESS OF BREATH *UNUSUAL BRUISING OR BLEEDING *URINARY PROBLEMS (pain or burning when urinating, or frequent urination) *BOWEL PROBLEMS (unusual diarrhea, constipation, pain near the anus) TENDERNESS IN MOUTH AND THROAT WITH OR WITHOUT PRESENCE OF ULCERS (sore throat, sores in mouth, or a toothache) UNUSUAL RASH, SWELLING OR PAIN  UNUSUAL VAGINAL DISCHARGE OR ITCHING   Items with * indicate a potential emergency and should be followed up as soon as possible or go to the Emergency Department if any problems should occur.  Please show the CHEMOTHERAPY ALERT CARD or IMMUNOTHERAPY ALERT CARD at check-in to the Emergency Department and triage nurse.  Should you have questions after your visit or need to cancel or reschedule your appointment, please contact [facility]  AT [facility]  Dept: [phone]  and follow the prompts.  Office hours are 8:00 a.m. to 4:30 p.m. Monday - Friday. Please note that voicemails left after 4:00 p.m. may not be returned until the following business day.  We are closed weekends and major holidays. You have access to a nurse at all times for urgent questions. Please call the main number to the clinic Dept: [phone] and follow the prompts.   For any non-urgent  questions, you may also contact your provider using MyChart. We now offer e-Visits for anyone 68 and older to request care online for non-urgent symptoms. For details visit [email or web address].   Also download the MyChart app! Go to the app store, search "MyChart", open the app, select [facility], and log in with your MyChart username and password.  Masks are optional in the cancer centers. If you would like for your care team to wear a mask while they are taking care of you, please let them know. For doctor visits, patients may have with them one support person who is at least 54 years old. At this time, visitors are not allowed in the infusion area.  Irinotecan injection What is this medication? IRINOTECAN (ir in oh TEE kan ) is a chemotherapy drug. It is used to treat colon and rectal cancer. This medicine may be used for other purposes; ask your health care provider or pharmacist if you have questions. COMMON BRAND NAME(S): Camptosar What should I tell my care team before I take this medication? They need to know if you have any of these conditions: dehydration diarrhea infection (especially a virus infection such as chickenpox, cold sores, or herpes) liver disease low blood counts, like low white cell, platelet, or red cell counts low levels of calcium, magnesium, or potassium in the blood recent or ongoing radiation therapy an unusual or allergic reaction to irinotecan, other medicines, foods, dyes, or preservatives pregnant or trying to get pregnant breast-feeding How should I use this medication? This drug is given as an infusion into a vein. It is administered in a hospital or clinic by a specially trained health care professional. Talk to your pediatrician regarding the use of this medicine in children. Special care may be needed. Overdosage: If you think you have taken too much of this medicine contact a poison control center or emergency room at once. NOTE: This medicine is  only for you. Do not share this medicine with others. What if I miss a dose? It is important not to miss your dose. Call your doctor or health care professional if you are unable to keep an appointment. What may interact with this medication? Do not take this medicine with any of the following medications: cobicistat itraconazole This medicine may interact with the following medications: antiviral medicines for HIV or AIDS certain antibiotics like rifampin or rifabutin certain medicines for fungal infections like ketoconazole, posaconazole, and voriconazole certain medicines for seizures like carbamazepine, phenobarbital, phenotoin clarithromycin gemfibrozil nefazodone [facility]'s Wort This list may not describe all possible interactions. Give your health care provider a list of all the medicines, herbs, non-prescription drugs, or dietary supplements you use. Also tell them if you smoke, drink alcohol, or use illegal drugs. Some items may interact with your medicine. What should I watch for while using this medication? Your condition will be monitored carefully while you are receiving this medicine. You will need important blood work done while you are taking this medicine. This drug may make you feel generally unwell. This is  not uncommon, as chemotherapy can affect healthy cells as well as cancer cells. Report any side effects. Continue your course of treatment even though you feel ill unless your doctor tells you to stop. In some cases, you may be given additional medicines to help with side effects. Follow all directions for their use. You may get drowsy or dizzy. Do not drive, use machinery, or do anything that needs mental alertness until you know how this medicine affects you. Do not stand or sit up quickly, especially if you are an older patient. This reduces the risk of dizzy or fainting spells. Call your health care professional for advice if you get a fever, chills, or sore throat, or  other symptoms of a cold or flu. Do not treat yourself. This medicine decreases your body's ability to fight infections. Try to avoid being around people who are sick. Avoid taking products that contain aspirin, acetaminophen, ibuprofen, naproxen, or ketoprofen unless instructed by your doctor. These medicines may hide a fever. This medicine may increase your risk to bruise or bleed. Call your doctor or health care professional if you notice any unusual bleeding. Be careful brushing and flossing your teeth or using a toothpick because you may get an infection or bleed more easily. If you have any dental work done, tell your dentist you are receiving this medicine. Do not become pregnant while taking this medicine or for 6 months after stopping it. Women should inform their health care professional if they wish to become pregnant or think they might be pregnant. Men should not father a child while taking this medicine and for 3 months after stopping it. There is potential for serious side effects to an unborn child. Talk to your health care professional for more information. Do not breast-feed an infant while taking this medicine or for 7 days after stopping it. This medicine has caused ovarian failure in some women. This medicine may make it more difficult to get pregnant. Talk to your health care professional if you are concerned about your fertility. This medicine has caused decreased sperm counts in some men. This may make it more difficult to father a child. Talk to your health care professional if you are concerned about your fertility. What side effects may I notice from receiving this medication? Side effects that you should report to your doctor or health care professional as soon as possible: allergic reactions like skin rash, itching or hives, swelling of the face, lips, or tongue chest pain diarrhea flushing, runny nose, sweating during infusion low blood counts - this medicine may decrease  the number of white blood cells, red blood cells and platelets. You may be at increased risk for infections and bleeding. nausea, vomiting pain, swelling, warmth in the leg signs of decreased platelets or bleeding - bruising, pinpoint red spots on the skin, black, tarry stools, blood in the urine signs of infection - fever or chills, cough, sore throat, pain or difficulty passing urine signs of decreased red blood cells - unusually weak or tired, fainting spells, lightheadedness Side effects that usually do not require medical attention (report to your doctor or health care professional if they continue or are bothersome): constipation hair loss headache loss of appetite mouth sores stomach pain This list may not describe all possible side effects. Call your doctor for medical advice about side effects. You may report side effects to FDA at 1-800-FDA-1088. Where should I keep my medication? This drug is given in a hospital or clinic and will  not be stored at home. NOTE: This sheet is a summary. It may not cover all possible information. If you have questions about this medicine, talk to your doctor, pharmacist, or health care provider.  2023 Elsevier/Gold Standard (2020-12-29 00:00:00)  Leucovorin injection What is this medication? LEUCOVORIN (loo koe VOR in) is used to prevent or treat the harmful effects of some medicines. This medicine is used to treat anemia caused by a low amount of folic acid in the body. It is also used with 5-fluorouracil (5-FU) to treat colon cancer. This medicine may be used for other purposes; ask your health care provider or pharmacist if you have questions. What should I tell my care team before I take this medication? They need to know if you have any of these conditions: anemia from low levels of vitamin B-12 in the blood an unusual or allergic reaction to leucovorin, folic acid, other medicines, foods, dyes, or preservatives pregnant or trying to get  pregnant breast-feeding How should I use this medication? This medicine is for injection into a muscle or into a vein. It is given by a health care professional in a hospital or clinic setting. Talk to your pediatrician regarding the use of this medicine in children. Special care may be needed. Overdosage: If you think you have taken too much of this medicine contact a poison control center or emergency room at once. NOTE: This medicine is only for you. Do not share this medicine with others. What if I miss a dose? This does not apply. What may interact with this medication? capecitabine fluorouracil phenobarbital phenytoin primidone trimethoprim-sulfamethoxazole This list may not describe all possible interactions. Give your health care provider a list of all the medicines, herbs, non-prescription drugs, or dietary supplements you use. Also tell them if you smoke, drink alcohol, or use illegal drugs. Some items may interact with your medicine. What should I watch for while using this medication? Your condition will be monitored carefully while you are receiving this medicine. This medicine may increase the side effects of 5-fluorouracil, 5-FU. Tell your doctor or health care professional if you have diarrhea or mouth sores that do not get better or that get worse. What side effects may I notice from receiving this medication? Side effects that you should report to your doctor or health care professional as soon as possible: allergic reactions like skin rash, itching or hives, swelling of the face, lips, or tongue breathing problems fever, infection mouth sores unusual bleeding or bruising unusually weak or tired Side effects that usually do not require medical attention (report to your doctor or health care professional if they continue or are bothersome): constipation or diarrhea loss of appetite nausea, vomiting This list may not describe all possible side effects. Call your doctor  for medical advice about side effects. You may report side effects to FDA at 1-800-FDA-1088. Where should I keep my medication? This drug is given in a hospital or clinic and will not be stored at home. NOTE: This sheet is a summary. It may not cover all possible information. If you have questions about this medicine, talk to your doctor, pharmacist, or health care provider.  2023 Elsevier/Gold Standard (2007-08-06 00:00:00)  Fluorouracil, 5-FU injection What is this medication? FLUOROURACIL, 5-FU (flure oh YOOR a sil) is a chemotherapy drug. It slows the growth of cancer cells. This medicine is used to treat many types of cancer like breast cancer, colon or rectal cancer, pancreatic cancer, and stomach cancer. This medicine may be used  for other purposes; ask your health care provider or pharmacist if you have questions. COMMON BRAND NAME(S): Adrucil What should I tell my care team before I take this medication? They need to know if you have any of these conditions: blood disorders dihydropyrimidine dehydrogenase (DPD) deficiency infection (especially a virus infection such as chickenpox, cold sores, or herpes) kidney disease liver disease malnourished, poor nutrition recent or ongoing radiation therapy an unusual or allergic reaction to fluorouracil, other chemotherapy, other medicines, foods, dyes, or preservatives pregnant or trying to get pregnant breast-feeding How should I use this medication? This drug is given as an infusion or injection into a vein. It is administered in a hospital or clinic by a specially trained health care professional. Talk to your pediatrician regarding the use of this medicine in children. Special care may be needed. Overdosage: If you think you have taken too much of this medicine contact a poison control center or emergency room at once. NOTE: This medicine is only for you. Do not share this medicine with others. What if I miss a dose? It is important  not to miss your dose. Call your doctor or health care professional if you are unable to keep an appointment. What may interact with this medication? Do not take this medicine with any of the following medications: live virus vaccines This medicine may also interact with the following medications: medicines that treat or prevent blood clots like warfarin, enoxaparin, and dalteparin This list may not describe all possible interactions. Give your health care provider a list of all the medicines, herbs, non-prescription drugs, or dietary supplements you use. Also tell them if you smoke, drink alcohol, or use illegal drugs. Some items may interact with your medicine. What should I watch for while using this medication? Visit your doctor for checks on your progress. This drug may make you feel generally unwell. This is not uncommon, as chemotherapy can affect healthy cells as well as cancer cells. Report any side effects. Continue your course of treatment even though you feel ill unless your doctor tells you to stop. In some cases, you may be given additional medicines to help with side effects. Follow all directions for their use. Call your doctor or health care professional for advice if you get a fever, chills or sore throat, or other symptoms of a cold or flu. Do not treat yourself. This drug decreases your body's ability to fight infections. Try to avoid being around people who are sick. This medicine may increase your risk to bruise or bleed. Call your doctor or health care professional if you notice any unusual bleeding. Be careful brushing and flossing your teeth or using a toothpick because you may get an infection or bleed more easily. If you have any dental work done, tell your dentist you are receiving this medicine. Avoid taking products that contain aspirin, acetaminophen, ibuprofen, naproxen, or ketoprofen unless instructed by your doctor. These medicines may hide a fever. Do not become  pregnant while taking this medicine. Women should inform their doctor if they wish to become pregnant or think they might be pregnant. There is a potential for serious side effects to an unborn child. Talk to your health care professional or pharmacist for more information. Do not breast-feed an infant while taking this medicine. Men should inform their doctor if they wish to father a child. This medicine may lower sperm counts. Do not treat diarrhea with over the counter products. Contact your doctor if you have diarrhea that lasts  more than 2 days or if it is severe and watery. This medicine can make you more sensitive to the sun. Keep out of the sun. If you cannot avoid being in the sun, wear protective clothing and use sunscreen. Do not use sun lamps or tanning beds/booths. What side effects may I notice from receiving this medication? Side effects that you should report to your doctor or health care professional as soon as possible: allergic reactions like skin rash, itching or hives, swelling of the face, lips, or tongue low blood counts - this medicine may decrease the number of white blood cells, red blood cells and platelets. You may be at increased risk for infections and bleeding. signs of infection - fever or chills, cough, sore throat, pain or difficulty passing urine signs of decreased platelets or bleeding - bruising, pinpoint red spots on the skin, black, tarry stools, blood in the urine signs of decreased red blood cells - unusually weak or tired, fainting spells, lightheadedness breathing problems changes in vision chest pain mouth sores nausea and vomiting pain, swelling, redness at site where injected pain, tingling, numbness in the hands or feet redness, swelling, or sores on hands or feet stomach pain unusual bleeding Side effects that usually do not require medical attention (report to your doctor or health care professional if they continue or are bothersome): changes in  finger or toe nails diarrhea dry or itchy skin hair loss headache loss of appetite sensitivity of eyes to the light stomach upset unusually teary eyes This list may not describe all possible side effects. Call your doctor for medical advice about side effects. You may report side effects to FDA at 1-800-FDA-1088. Where should I keep my medication? This drug is given in a hospital or clinic and will not be stored at home. NOTE: This sheet is a summary. It may not cover all possible information. If you have questions about this medicine, talk to your doctor, pharmacist, or health care provider.  2023 Elsevier/Gold Standard (2020-12-29 00:00:00)

## 2021-12-27 NOTE — Patient Instructions (Signed)

## 2021-12-27 NOTE — Progress Notes (Signed)
Orders entered for 1L IVF on 12/29/21.  Per Ned Card, NP, ok for patient to receive 525m of fluids if that is the patient's request.

## 2021-12-27 NOTE — Progress Notes (Addendum)
Patient seen by Dr. Benay Spice today  Vitals are within treatment parameters.  Labs reviewed by Dr. Benay Spice CBC diff reviewed and within treatment parameters, CMP pending.  Per physician team, patient is ready for treatment and there are NO modifications to the treatment plan. CMP pending OK to start premeds and fluids pending CMP    Repeat CMP reviewed by Dr Benay Spice and ok to treat. Fluids added to orders today and pump disconnect day per Dr Benay Spice.

## 2021-12-27 NOTE — Telephone Encounter (Signed)
CRITICAL VALUE STICKER  CRITICAL VALUE: K 2.1 Calcium 4.5  RECEIVER (on-site recipient of call): Gerald Stabs  DATE & TIME NOTIFIED: 12/27/2021 1012  MESSENGER (representative from lab): Silva Bandy  MD NOTIFIED: Dr Benay Spice  TIME OF NOTIFICATION: 9806  RESPONSE:  [facility]

## 2021-12-27 NOTE — Progress Notes (Signed)
[place] OFFICE PROGRESS NOTE   Diagnosis: Pancreas cancer  INTERVAL HISTORY:   Ms. Haws completed another cycle of chemotherapy on 12/12/2021.  No nausea/vomiting, mouth sores, or diarrhea.  She continues to have back pain.  She takes 3-4 tramadol tablets per day for relief of pain.  She is exercising.  Neuropathy symptoms have improved.  She was scheduled to be seen at the [facility] this week, but the appointment was canceled when the restaging CT result was available.  Objective:  Vital signs in last 24 hours:  Blood pressure 126/68, pulse 89, temperature 98.1 F (36.7 C), resp. rate 18, height _0  (1.778 m), weight 110 lb 9.6 oz (50.2 kg), last menstrual period 11/01/2018, SpO2 100 %.    HEENT: No thrush or ulcers Resp: Lungs clear bilaterally Cardio: Regular rate and rhythm GI: Nontender, no hepatosplenomegaly, no mass Vascular: No leg edema  Portacath/PICC-without erythema  Lab Results:  Lab Results  Component Value Date   WBC 11.5 (H) 12/27/2021   HGB 10.4 (L) 12/27/2021   HCT 33.2 (L) 12/27/2021   MCV 101.5 (H) 12/27/2021   PLT 153 12/27/2021   NEUTROABS 8.8 (H) 12/27/2021    CMP  Lab Results  Component Value Date   NA 141 12/12/2021   K 3.9 12/12/2021   CL 105 12/12/2021   CO2 27 12/12/2021   GLUCOSE 119 (H) 12/12/2021   BUN 13 12/12/2021   CREATININE 0.65 12/12/2021   CALCIUM 9.3 12/12/2021   PROT 6.8 12/12/2021   ALBUMIN 4.0 12/12/2021   AST 21 12/12/2021   ALT 21 12/12/2021   ALKPHOS 129 (H) 12/12/2021   BILITOT 0.2 (L) 12/12/2021   GFRNONAA >60 12/12/2021   GFRAA 76 09/08/2019    Lab Results  Component Value Date   UQJ335 354 (H) 12/12/2021   Imaging:  No results found.  Medications: I have reviewed the patient's current medications.   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  Less than 180 degree abutment of the distal celiac, common hepatic, and splenic arteries, less than 180 degree abutment of the SMA CT chest 07/18/2021-no evidence of metastatic disease EUS 07/16/2021-extrinsic impression on the stomach at the posterior wall of the gastric body, no gross lesion in the duodenum, 45 x 36 mm pancreas body mass, abutment of the SMA, celiac trunk, and compression of the splenoportal confluence.  No malignant appearing lymph nodes, numerous venous collaterals adjacent to the portal vein, FNA biopsy-malignant cells consistent with adenocarcinoma, T4N0 by EUS Markedly elevated CA 19-9 Guardant360 07/31/2021-K-ras G12R, MSI high-not detected, ATM VUS Cycle 1 FOLFOX 08/08/2021 Cycle 2 FOLFIRINOX 08/22/2021 CT abdomen[facility]pelvis at [facility] 08/31/2021-hypoattenuating liver lesions consistent with metastases, mass in the pancreas neck/body with greater than 100 degree encasement of the celiac axis and common hepatic artery.  Less than 180 degree abutment of the SMA, long segment occlusion of the portal splenic confluence, splenic vein occluded, multiple collateral vessels in the upper abdomen, prominent left periaortic node Cycle 3 FOLFIRINOX 09/05/2021 Cycle 4 FOLFIRINOX 09/19/2021 Cycle 5 FOLFIRINOX 10/03/2021 CTs 10/16/2021-decrease size of primary pancreas mass, hepatic metastases, and a suspicious retroperitoneal node, resolution of left supraclavicular/low jugular adenopathy compared to a CT chest 07/18/2021.  No evidence of disease progression. Cycle 6 FOLFIRINOX 10/17/2021 Cycle 7 FOLFIRINOX 10/31/2021 Cycle 8 FOLFIRINOX 11/14/2021 Cycle 9 FOLFIRINOX 11/28/2021 Cycle 10 FOLFIRINOX  12/12/2021, oxaliplatin held secondary to neuropathy symptoms CTs 12/20/2021-decrease size of pancreas primary, no evidence of hepatic metastases, no new or progressive disease Cycle 11 FOLFIRINOX 12/27/2021, oxaliplatin held due to  neuropathy symptoms Pain secondary to #1-controlled with tramadol and Tylenol. 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 Polanco appears stable.  She has completed 10 cycles of systemic therapy.  There has been clinical and radiologic improvement.  I reviewed the 12/20/2021 CT findings and images with her.  The pancreas mass appears smaller and liver metastases are not visible.  She is not a candidate for  TIL harvest in the absence of measurable metastatic disease.  The plan is to continue FOLFIRI.  She will complete another cycle today.  She will return for an office visit and chemotherapy in 2 weeks.  We will plan for a restaging CT in 2-3 months.  I will refer her to the [facility] if she has measurable metastatic disease in the liver or elsewhere.   Betsy Coder, MD  12/27/2021  10:07 AM

## 2021-12-28 ENCOUNTER — Ambulatory Visit: Payer: BC Managed Care – PPO

## 2021-12-28 ENCOUNTER — Other Ambulatory Visit: Payer: Self-pay | Admitting: Oncology

## 2021-12-29 ENCOUNTER — Inpatient Hospital Stay: Payer: BC Managed Care – PPO

## 2021-12-29 VITALS — BP 118/62 | HR 68 | Temp 98.1°F | Resp 16

## 2021-12-29 DIAGNOSIS — C251 Malignant neoplasm of body of pancreas: Secondary | ICD-10-CM

## 2021-12-29 DIAGNOSIS — R110 Nausea: Secondary | ICD-10-CM

## 2021-12-29 MED ORDER — PEGFILGRASTIM-CBQV 6 MG/0.6ML ~~LOC~~ SOSY
6.0000 mg | PREFILLED_SYRINGE | Freq: Once | SUBCUTANEOUS | Status: AC
  Administered 2021-12-29: 6 mg via SUBCUTANEOUS
  Filled 2021-12-29: qty 0.6

## 2021-12-29 MED ORDER — SODIUM CHLORIDE 0.9 % IV SOLN: Freq: Once | INTRAVENOUS | Status: AC

## 2021-12-29 MED ORDER — HEPARIN SOD (PORK) LOCK FLUSH 100 UNIT/ML IV SOLN
500.0000 [IU] | Freq: Once | INTRAVENOUS | Status: AC | PRN
  Administered 2021-12-29: 500 [IU]

## 2021-12-29 MED ORDER — SODIUM CHLORIDE 0.9% FLUSH
10.0000 mL | INTRAVENOUS | Status: DC | PRN
  Administered 2021-12-29: 10 mL

## 2021-12-29 NOTE — Progress Notes (Signed)
Per Patient request only 500 mL of NS administered for IVFs today.

## 2021-12-29 NOTE — Patient Instructions (Signed)

## 2022-01-05 ENCOUNTER — Other Ambulatory Visit: Payer: Self-pay | Admitting: Oncology

## 2022-01-09 ENCOUNTER — Encounter: Payer: Self-pay | Admitting: Nurse Practitioner

## 2022-01-09 ENCOUNTER — Other Ambulatory Visit: Payer: Self-pay

## 2022-01-09 ENCOUNTER — Inpatient Hospital Stay: Payer: BC Managed Care – PPO

## 2022-01-09 ENCOUNTER — Inpatient Hospital Stay (HOSPITAL_BASED_OUTPATIENT_CLINIC_OR_DEPARTMENT_OTHER): Payer: BC Managed Care – PPO | Admitting: Nurse Practitioner

## 2022-01-09 VITALS — BP 113/72 | HR 60 | Temp 98.1°F | Resp 18 | Ht 70.0 in | Wt 110.2 lb

## 2022-01-09 DIAGNOSIS — C251 Malignant neoplasm of body of pancreas: Secondary | ICD-10-CM

## 2022-01-09 LAB — CMP (CANCER CENTER ONLY)
ALT: 14 U/L (ref 0–44)
AST: 15 U/L (ref 15–41)
Albumin: 4.1 g/dL (ref 3.5–5.0)
Alkaline Phosphatase: 114 U/L (ref 38–126)
Anion gap: 7 (ref 5–15)
BUN: 20 mg/dL (ref 6–20)
CO2: 26 mmol/L (ref 22–32)
Calcium: 9.1 mg/dL (ref 8.9–10.3)
Chloride: 107 mmol/L (ref 98–111)
Creatinine: 0.6 mg/dL (ref 0.44–1.00)
GFR, Estimated: >60 mL/min (ref >60)
Glucose, Bld: 114 mg/dL — ABNORMAL HIGH (ref 70–99)
Potassium: 3.8 mmol/L (ref 3.5–5.1)
Sodium: 140 mmol/L (ref 135–145)
Total Bilirubin: 0.3 mg/dL (ref 0.3–1.2)
Total Protein: 6.8 g/dL (ref 6.5–8.1)

## 2022-01-09 LAB — CBC WITH DIFFERENTIAL (CANCER CENTER ONLY)
Abs Immature Granulocytes: 0.13 10*3/uL — ABNORMAL HIGH (ref 0.00–0.07)
Basophils Absolute: 0 10*3/uL (ref 0.0–0.1)
Basophils Relative: 0 %
Eosinophils Absolute: 0.2 10*3/uL (ref 0.0–0.5)
Eosinophils Relative: 2 %
HCT: 33.3 % — ABNORMAL LOW (ref 36.0–46.0)
Hemoglobin: 10.6 g/dL — ABNORMAL LOW (ref 12.0–15.0)
Immature Granulocytes: 1 %
Lymphocytes Relative: 16 %
Lymphs Abs: 2 10*3/uL (ref 0.7–4.0)
MCH: 32.2 pg (ref 26.0–34.0)
MCHC: 31.8 g/dL (ref 30.0–36.0)
MCV: 101.2 fL — ABNORMAL HIGH (ref 80.0–100.0)
Monocytes Absolute: 0.7 10*3/uL (ref 0.1–1.0)
Monocytes Relative: 5 %
Neutro Abs: 9 10*3/uL — ABNORMAL HIGH (ref 1.7–7.7)
Neutrophils Relative %: 76 %
Platelet Count: 165 10*3/uL (ref 150–400)
RBC: 3.29 MIL/uL — ABNORMAL LOW (ref 3.87–5.11)
RDW: 15.6 % — ABNORMAL HIGH (ref 11.5–15.5)
WBC Count: 12 10*3/uL — ABNORMAL HIGH (ref 4.0–10.5)
nRBC: 0 % (ref 0.0–0.2)

## 2022-01-09 MED ORDER — ATROPINE SULFATE 1 MG/ML IV SOLN
0.5000 mg | Freq: Once | INTRAVENOUS | Status: AC | PRN
  Administered 2022-01-09: 0.5 mg via INTRAVENOUS
  Filled 2022-01-09: qty 1

## 2022-01-09 MED ORDER — SODIUM CHLORIDE 0.9 % IV SOLN
10.0000 mg | Freq: Once | INTRAVENOUS | Status: AC
  Administered 2022-01-09: 10 mg via INTRAVENOUS
  Filled 2022-01-09: qty 1

## 2022-01-09 MED ORDER — PALONOSETRON HCL INJECTION 0.25 MG/5ML
0.2500 mg | Freq: Once | INTRAVENOUS | Status: AC
  Administered 2022-01-09: 0.25 mg via INTRAVENOUS
  Filled 2022-01-09: qty 5

## 2022-01-09 MED ORDER — TRAMADOL HCL 50 MG PO TABS: 50.0000 mg | ORAL_TABLET | Freq: Four times a day (QID) | ORAL | 0 refills | Status: DC | PRN

## 2022-01-09 MED ORDER — SODIUM CHLORIDE 0.9 % IV SOLN
150.0000 mg/m2 | Freq: Once | INTRAVENOUS | Status: AC
  Administered 2022-01-09: 240 mg via INTRAVENOUS
  Filled 2022-01-09: qty 10

## 2022-01-09 MED ORDER — SODIUM CHLORIDE 0.9% FLUSH
10.0000 mL | INTRAVENOUS | Status: DC | PRN
  Administered 2022-01-09: 10 mL

## 2022-01-09 MED ORDER — SODIUM CHLORIDE 0.9 % IV SOLN
150.0000 mg | Freq: Once | INTRAVENOUS | Status: AC
  Administered 2022-01-09: 150 mg via INTRAVENOUS
  Filled 2022-01-09: qty 5

## 2022-01-09 MED ORDER — SODIUM CHLORIDE 0.9 % IV SOLN
2400.0000 mg/m2 | INTRAVENOUS | Status: DC
  Administered 2022-01-09: 3750 mg via INTRAVENOUS
  Filled 2022-01-09: qty 75

## 2022-01-09 MED ORDER — SODIUM CHLORIDE 0.9 % IV SOLN: Freq: Once | INTRAVENOUS | Status: AC

## 2022-01-09 MED ORDER — SODIUM CHLORIDE 0.9 % IV SOLN
400.0000 mg/m2 | Freq: Once | INTRAVENOUS | Status: AC
  Administered 2022-01-09: 624 mg via INTRAVENOUS
  Filled 2022-01-09: qty 31.2

## 2022-01-09 NOTE — Progress Notes (Signed)
[place] OFFICE PROGRESS NOTE   Diagnosis: Pancreas cancer  INTERVAL HISTORY:   Tara Jensen returns as scheduled.  She completed another cycle of chemotherapy 12/27/2021.  Oxaliplatin was held due to neuropathy symptoms.  She denies nausea/vomiting.  No mouth sores.  No diarrhea.  She is no longer experiencing neuropathy symptoms.  She notes back pain with prolonged standing.  Occasional abdominal pain after eating.  She takes tramadol sporadically.  Objective:  Vital signs in last 24 hours:  Blood pressure 113/72, pulse 60, temperature 98.1 F (36.7 C), temperature source Oral, resp. rate 18, height _0  (1.778 m), weight 110 lb 3.2 oz (50 kg), last menstrual period 11/01/2018, SpO2 100 %.    HEENT: No thrush or ulcers. Resp: Lungs clear bilaterally. Cardio: Regular rate and rhythm. GI: Abdomen soft and nontender.  No hepatosplenomegaly.  No mass. Vascular: No leg edema. Neuro: Alert and oriented. Skin: No rash. Port-A-Cath without erythema.   Lab Results:  Lab Results  Component Value Date   WBC 12.0 (H) 01/09/2022   HGB 10.6 (L) 01/09/2022   HCT 33.3 (L) 01/09/2022   MCV 101.2 (H) 01/09/2022   PLT 165 01/09/2022   NEUTROABS 9.0 (H) 01/09/2022    Imaging:  No results found.  Medications: I have reviewed the patient's current medications.  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  Less than 180 degree abutment of the distal celiac, common hepatic, and splenic arteries, less than 180 degree abutment of the SMA CT chest 07/18/2021-no evidence of metastatic disease EUS 07/16/2021-extrinsic impression on the stomach at the posterior wall of the gastric body, no gross lesion in the duodenum, 45 x 36 mm pancreas body mass, abutment of  the SMA, celiac trunk, and compression of the splenoportal confluence.  No malignant appearing lymph nodes, numerous venous collaterals adjacent to the portal vein, FNA biopsy-malignant cells consistent with adenocarcinoma, T4N0 by EUS Markedly elevated CA 19-9 Guardant360 07/31/2021-K-ras G12R, MSI high-not detected, ATM VUS Cycle 1 FOLFOX 08/08/2021 Cycle 2 FOLFIRINOX 08/22/2021 CT abdomen/pelvis at [facility] 08/31/2021-hypoattenuating liver lesions consistent with metastases, mass in the pancreas neck/body with greater than 100 degree encasement of the celiac axis and common hepatic artery.  Less than 180 degree abutment of the SMA, long segment occlusion of the portal splenic confluence, splenic vein occluded, multiple collateral vessels in the upper abdomen, prominent left periaortic node Cycle 3 FOLFIRINOX 09/05/2021 Cycle 4 FOLFIRINOX 09/19/2021 Cycle 5 FOLFIRINOX 10/03/2021 CTs 10/16/2021-decrease size of primary pancreas mass, hepatic metastases, and a suspicious retroperitoneal node, resolution of left supraclavicular/low jugular adenopathy compared to a CT chest 07/18/2021.  No evidence of disease progression. Cycle 6 FOLFIRINOX 10/17/2021 Cycle 7 FOLFIRINOX 10/31/2021 Cycle 8 FOLFIRINOX 11/14/2021 Cycle 9 FOLFIRINOX 11/28/2021 Cycle 10 FOLFIRINOX 12/12/2021, oxaliplatin held secondary to neuropathy symptoms CTs 12/20/2021-decrease size of pancreas primary, no evidence of hepatic metastases, no new or progressive disease Cycle 11 FOLFIRINOX 12/27/2021, oxaliplatin held due to neuropathy symptoms Cycle 12 FOLFIRINOX 01/09/2022, oxaliplatin held Pain secondary to #1-controlled with tramadol and Tylenol. 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  She continues chemotherapy with FOLFIRI every 2 weeks.  Overall  she is tolerating well.  Plan to continue  the same.  Most recent CA 19-9 further improved.  CBC and chemistry panel reviewed.  Labs adequate to proceed as above.  She will return for lab, follow-up, FOLFIRI in 2 weeks.  We are available to see her sooner if needed.    Ned Card ANP/GNP-BC   01/09/2022  9:32 AM

## 2022-01-09 NOTE — Patient Instructions (Addendum)
[name]  The chemotherapy medication bag should finish at 46 hours, 96 hours, or 7 days. For example, if your pump is scheduled for 46 hours and it was put on at 4:00 p.m., it should finish at 2:00 p.m. the day it is scheduled to come off regardless of your appointment time.     Estimated time to finish at 11:15am on Friday December 1st, 2023   If the display on your pump reads "Low Volume" and it is beeping, take the batteries out of the pump and come to the cancer center for it to be taken off.   If the pump alarms go off prior to the pump reading "Low Volume" then call [phone] and someone can assist you.  If the plunger comes out and the chemotherapy medication is leaking out, please use your home chemo spill kit to clean up the spill. Do NOT use paper towels or other household products.  If you have problems or questions regarding your pump, please call either [phone] (24 hours a day) or the cancer center Monday-Friday 8:00 a.m.- 4:30 p.m. at the clinic number and we will assist you. If you are unable to get assistance, then go to the nearest Emergency Department and ask the staff to contact the IV team for assistance.   Discharge Instructions: Thank you for choosing [facility] to provide your oncology and hematology care.   If you have a lab appointment with the [facility], please go directly to the [facility] and check in at the registration area.   Wear comfortable clothing and clothing appropriate for easy access to any Portacath or PICC line.   We strive to give you quality time with your provider. You may need to reschedule your appointment if you arrive late (15 or more minutes).  Arriving late affects you and other patients whose appointments are after yours.  Also, if you miss three or more appointments without notifying the office, you may be dismissed from the clinic at the provider's discretion.      For  prescription refill requests, have your pharmacy contact our office and allow 72 hours for refills to be completed.    Today you received the following chemotherapy and/or immunotherapy agents Oxaliplatin,Irinotecan, Leucovorin, Fluorouracil      To help prevent nausea and vomiting after your treatment, we encourage you to take your nausea medication as directed.  BELOW ARE SYMPTOMS THAT SHOULD BE REPORTED IMMEDIATELY: *FEVER GREATER THAN 100.4 F (38 C) OR HIGHER *CHILLS OR SWEATING *NAUSEA AND VOMITING THAT IS NOT CONTROLLED WITH YOUR NAUSEA MEDICATION *UNUSUAL SHORTNESS OF BREATH *UNUSUAL BRUISING OR BLEEDING *URINARY PROBLEMS (pain or burning when urinating, or frequent urination) *BOWEL PROBLEMS (unusual diarrhea, constipation, pain near the anus) TENDERNESS IN MOUTH AND THROAT WITH OR WITHOUT PRESENCE OF ULCERS (sore throat, sores in mouth, or a toothache) UNUSUAL RASH, SWELLING OR PAIN  UNUSUAL VAGINAL DISCHARGE OR ITCHING   Items with * indicate a potential emergency and should be followed up as soon as possible or go to the Emergency Department if any problems should occur.  Please show the CHEMOTHERAPY ALERT CARD or IMMUNOTHERAPY ALERT CARD at check-in to the Emergency Department and triage nurse.  Should you have questions after your visit or need to cancel or reschedule your appointment, please contact [facility]  Dept: [phone]  and follow the prompts.  Office hours are 8:00 a.m. to 4:30 p.m. Monday - Friday. Please note that  voicemails left after 4:00 p.m. may not be returned until the following business day.  We are closed weekends and major holidays. You have access to a nurse at all times for urgent questions. Please call the main number to the clinic Dept: [phone] and follow the prompts.   For any non-urgent questions, you may also contact your provider using MyChart. We now offer e-Visits for anyone 63 and older to request care online  for non-urgent symptoms. For details visit [email or web address].   Also download the MyChart app! Go to the app store, search "MyChart", open the app, select [facility], and log in with your MyChart username and password.  Masks are optional in the cancer centers. If you would like for your care team to wear a mask while they are taking care of you, please let them know. For doctor visits, patients may have with them one support person who is at least 54 years old. At this time, visitors are not allowed in the infusion area.  Irinotecan injection What is this medication? IRINOTECAN (ir in oh TEE kan ) is a chemotherapy drug. It is used to treat colon and rectal cancer. This medicine may be used for other purposes; ask your health care provider or pharmacist if you have questions. COMMON BRAND NAME(S): Camptosar What should I tell my care team before I take this medication? They need to know if you have any of these conditions: dehydration diarrhea infection (especially a virus infection such as chickenpox, cold sores, or herpes) liver disease low blood counts, like low white cell, platelet, or red cell counts low levels of calcium, magnesium, or potassium in the blood recent or ongoing radiation therapy an unusual or allergic reaction to irinotecan, other medicines, foods, dyes, or preservatives pregnant or trying to get pregnant breast-feeding How should I use this medication? This drug is given as an infusion into a vein. It is administered in a hospital or clinic by a specially trained health care professional. Talk to your pediatrician regarding the use of this medicine in children. Special care may be needed. Overdosage: If you think you have taken too much of this medicine contact a poison control center or emergency room at once. NOTE: This medicine is only for you. Do not share this medicine with others. What if I miss a dose? It is important not to miss your dose. Call your  doctor or health care professional if you are unable to keep an appointment. What may interact with this medication? Do not take this medicine with any of the following medications: cobicistat itraconazole This medicine may interact with the following medications: antiviral medicines for HIV or AIDS certain antibiotics like rifampin or rifabutin certain medicines for fungal infections like ketoconazole, posaconazole, and voriconazole certain medicines for seizures like carbamazepine, phenobarbital, phenotoin clarithromycin gemfibrozil nefazodone [facility]. John's Wort This list may not describe all possible interactions. Give your health care provider a list of all the medicines, herbs, non-prescription drugs, or dietary supplements you use. Also tell them if you smoke, drink alcohol, or use illegal drugs. Some items may interact with your medicine. What should I watch for while using this medication? Your condition will be monitored carefully while you are receiving this medicine. You will need important blood work done while you are taking this medicine. This drug may make you feel generally unwell. This is not uncommon, as chemotherapy can affect healthy cells as well as cancer cells. Report any side effects. Continue your course of treatment even though you  feel ill unless your doctor tells you to stop. In some cases, you may be given additional medicines to help with side effects. Follow all directions for their use. You may get drowsy or dizzy. Do not drive, use machinery, or do anything that needs mental alertness until you know how this medicine affects you. Do not stand or sit up quickly, especially if you are an older patient. This reduces the risk of dizzy or fainting spells. Call your health care professional for advice if you get a fever, chills, or sore throat, or other symptoms of a cold or flu. Do not treat yourself. This medicine decreases your body's ability to fight infections. Try to  avoid being around people who are sick. Avoid taking products that contain aspirin, acetaminophen, ibuprofen, naproxen, or ketoprofen unless instructed by your doctor. These medicines may hide a fever. This medicine may increase your risk to bruise or bleed. Call your doctor or health care professional if you notice any unusual bleeding. Be careful brushing and flossing your teeth or using a toothpick because you may get an infection or bleed more easily. If you have any dental work done, tell your dentist you are receiving this medicine. Do not become pregnant while taking this medicine or for 6 months after stopping it. Women should inform their health care professional if they wish to become pregnant or think they might be pregnant. Men should not father a child while taking this medicine and for 3 months after stopping it. There is potential for serious side effects to an unborn child. Talk to your health care professional for more information. Do not breast-feed an infant while taking this medicine or for 7 days after stopping it. This medicine has caused ovarian failure in some women. This medicine may make it more difficult to get pregnant. Talk to your health care professional if you are concerned about your fertility. This medicine has caused decreased sperm counts in some men. This may make it more difficult to father a child. Talk to your health care professional if you are concerned about your fertility. What side effects may I notice from receiving this medication? Side effects that you should report to your doctor or health care professional as soon as possible: allergic reactions like skin rash, itching or hives, swelling of the face, lips, or tongue chest pain diarrhea flushing, runny nose, sweating during infusion low blood counts - this medicine may decrease the number of white blood cells, red blood cells and platelets. You may be at increased risk for infections and  bleeding. nausea, vomiting pain, swelling, warmth in the leg signs of decreased platelets or bleeding - bruising, pinpoint red spots on the skin, black, tarry stools, blood in the urine signs of infection - fever or chills, cough, sore throat, pain or difficulty passing urine signs of decreased red blood cells - unusually weak or tired, fainting spells, lightheadedness Side effects that usually do not require medical attention (report to your doctor or health care professional if they continue or are bothersome): constipation hair loss headache loss of appetite mouth sores stomach pain This list may not describe all possible side effects. Call your doctor for medical advice about side effects. You may report side effects to FDA at 1-800-FDA-1088. Where should I keep my medication? This drug is given in a hospital or clinic and will not be stored at home. NOTE: This sheet is a summary. It may not cover all possible information. If you have questions about this medicine,  talk to your doctor, pharmacist, or health care provider.  2023 Elsevier/Gold Standard (2020-12-29 00:00:00)  Leucovorin injection What is this medication? LEUCOVORIN (loo koe VOR in) is used to prevent or treat the harmful effects of some medicines. This medicine is used to treat anemia caused by a low amount of folic acid in the body. It is also used with 5-fluorouracil (5-FU) to treat colon cancer. This medicine may be used for other purposes; ask your health care provider or pharmacist if you have questions. What should I tell my care team before I take this medication? They need to know if you have any of these conditions: anemia from low levels of vitamin B-12 in the blood an unusual or allergic reaction to leucovorin, folic acid, other medicines, foods, dyes, or preservatives pregnant or trying to get pregnant breast-feeding How should I use this medication? This medicine is for injection into a muscle or into a  vein. It is given by a health care professional in a hospital or clinic setting. Talk to your pediatrician regarding the use of this medicine in children. Special care may be needed. Overdosage: If you think you have taken too much of this medicine contact a poison control center or emergency room at once. NOTE: This medicine is only for you. Do not share this medicine with others. What if I miss a dose? This does not apply. What may interact with this medication? capecitabine fluorouracil phenobarbital phenytoin primidone trimethoprim-sulfamethoxazole This list may not describe all possible interactions. Give your health care provider a list of all the medicines, herbs, non-prescription drugs, or dietary supplements you use. Also tell them if you smoke, drink alcohol, or use illegal drugs. Some items may interact with your medicine. What should I watch for while using this medication? Your condition will be monitored carefully while you are receiving this medicine. This medicine may increase the side effects of 5-fluorouracil, 5-FU. Tell your doctor or health care professional if you have diarrhea or mouth sores that do not get better or that get worse. What side effects may I notice from receiving this medication? Side effects that you should report to your doctor or health care professional as soon as possible: allergic reactions like skin rash, itching or hives, swelling of the face, lips, or tongue breathing problems fever, infection mouth sores unusual bleeding or bruising unusually weak or tired Side effects that usually do not require medical attention (report to your doctor or health care professional if they continue or are bothersome): constipation or diarrhea loss of appetite nausea, vomiting This list may not describe all possible side effects. Call your doctor for medical advice about side effects. You may report side effects to FDA at 1-800-FDA-1088. Where should I keep  my medication? This drug is given in a hospital or clinic and will not be stored at home. NOTE: This sheet is a summary. It may not cover all possible information. If you have questions about this medicine, talk to your doctor, pharmacist, or health care provider.  2023 Elsevier/Gold Standard (2007-08-06 00:00:00)  Fluorouracil, 5-FU injection What is this medication? FLUOROURACIL, 5-FU (flure oh YOOR a sil) is a chemotherapy drug. It slows the growth of cancer cells. This medicine is used to treat many types of cancer like breast cancer, colon or rectal cancer, pancreatic cancer, and stomach cancer. This medicine may be used for other purposes; ask your health care provider or pharmacist if you have questions. COMMON BRAND NAME(S): Adrucil What should I tell my care team  before I take this medication? They need to know if you have any of these conditions: blood disorders dihydropyrimidine dehydrogenase (DPD) deficiency infection (especially a virus infection such as chickenpox, cold sores, or herpes) kidney disease liver disease malnourished, poor nutrition recent or ongoing radiation therapy an unusual or allergic reaction to fluorouracil, other chemotherapy, other medicines, foods, dyes, or preservatives pregnant or trying to get pregnant breast-feeding How should I use this medication? This drug is given as an infusion or injection into a vein. It is administered in a hospital or clinic by a specially trained health care professional. Talk to your pediatrician regarding the use of this medicine in children. Special care may be needed. Overdosage: If you think you have taken too much of this medicine contact a poison control center or emergency room at once. NOTE: This medicine is only for you. Do not share this medicine with others. What if I miss a dose? It is important not to miss your dose. Call your doctor or health care professional if you are unable to keep an  appointment. What may interact with this medication? Do not take this medicine with any of the following medications: live virus vaccines This medicine may also interact with the following medications: medicines that treat or prevent blood clots like warfarin, enoxaparin, and dalteparin This list may not describe all possible interactions. Give your health care provider a list of all the medicines, herbs, non-prescription drugs, or dietary supplements you use. Also tell them if you smoke, drink alcohol, or use illegal drugs. Some items may interact with your medicine. What should I watch for while using this medication? Visit your doctor for checks on your progress. This drug may make you feel generally unwell. This is not uncommon, as chemotherapy can affect healthy cells as well as cancer cells. Report any side effects. Continue your course of treatment even though you feel ill unless your doctor tells you to stop. In some cases, you may be given additional medicines to help with side effects. Follow all directions for their use. Call your doctor or health care professional for advice if you get a fever, chills or sore throat, or other symptoms of a cold or flu. Do not treat yourself. This drug decreases your body's ability to fight infections. Try to avoid being around people who are sick. This medicine may increase your risk to bruise or bleed. Call your doctor or health care professional if you notice any unusual bleeding. Be careful brushing and flossing your teeth or using a toothpick because you may get an infection or bleed more easily. If you have any dental work done, tell your dentist you are receiving this medicine. Avoid taking products that contain aspirin, acetaminophen, ibuprofen, naproxen, or ketoprofen unless instructed by your doctor. These medicines may hide a fever. Do not become pregnant while taking this medicine. Women should inform their doctor if they wish to become pregnant  or think they might be pregnant. There is a potential for serious side effects to an unborn child. Talk to your health care professional or pharmacist for more information. Do not breast-feed an infant while taking this medicine. Men should inform their doctor if they wish to father a child. This medicine may lower sperm counts. Do not treat diarrhea with over the counter products. Contact your doctor if you have diarrhea that lasts more than 2 days or if it is severe and watery. This medicine can make you more sensitive to the sun. Keep out of the  sun. If you cannot avoid being in the sun, wear protective clothing and use sunscreen. Do not use sun lamps or tanning beds/booths. What side effects may I notice from receiving this medication? Side effects that you should report to your doctor or health care professional as soon as possible: allergic reactions like skin rash, itching or hives, swelling of the face, lips, or tongue low blood counts - this medicine may decrease the number of white blood cells, red blood cells and platelets. You may be at increased risk for infections and bleeding. signs of infection - fever or chills, cough, sore throat, pain or difficulty passing urine signs of decreased platelets or bleeding - bruising, pinpoint red spots on the skin, black, tarry stools, blood in the urine signs of decreased red blood cells - unusually weak or tired, fainting spells, lightheadedness breathing problems changes in vision chest pain mouth sores nausea and vomiting pain, swelling, redness at site where injected pain, tingling, numbness in the hands or feet redness, swelling, or sores on hands or feet stomach pain unusual bleeding Side effects that usually do not require medical attention (report to your doctor or health care professional if they continue or are bothersome): changes in finger or toe nails diarrhea dry or itchy skin hair loss headache loss of appetite sensitivity  of eyes to the light stomach upset unusually teary eyes This list may not describe all possible side effects. Call your doctor for medical advice about side effects. You may report side effects to FDA at 1-800-FDA-1088. Where should I keep my medication? This drug is given in a hospital or clinic and will not be stored at home. NOTE: This sheet is a summary. It may not cover all possible information. If you have questions about this medicine, talk to your doctor, pharmacist, or health care provider.  2023 Elsevier/Gold Standard (2020-12-29 00:00:00)

## 2022-01-09 NOTE — Patient Instructions (Signed)

## 2022-01-09 NOTE — Progress Notes (Signed)
Patient seen by Lisa Thomas NP today  Vitals are within treatment parameters.  Labs reviewed by Lisa Thomas NP and are within treatment parameters.  Per physician team, patient is ready for treatment and there are NO modifications to the treatment plan.     

## 2022-01-10 ENCOUNTER — Other Ambulatory Visit: Payer: Self-pay

## 2022-01-11 ENCOUNTER — Inpatient Hospital Stay: Payer: BC Managed Care – PPO | Attending: Oncology

## 2022-01-11 VITALS — BP 120/72 | HR 65 | Temp 97.8°F | Resp 18

## 2022-01-11 DIAGNOSIS — Z803 Family history of malignant neoplasm of breast: Secondary | ICD-10-CM | POA: Diagnosis not present

## 2022-01-11 DIAGNOSIS — Z5111 Encounter for antineoplastic chemotherapy: Secondary | ICD-10-CM | POA: Insufficient documentation

## 2022-01-11 DIAGNOSIS — Z79899 Other long term (current) drug therapy: Secondary | ICD-10-CM | POA: Insufficient documentation

## 2022-01-11 DIAGNOSIS — G620 Drug-induced polyneuropathy: Secondary | ICD-10-CM | POA: Insufficient documentation

## 2022-01-11 DIAGNOSIS — C787 Secondary malignant neoplasm of liver and intrahepatic bile duct: Secondary | ICD-10-CM | POA: Diagnosis present

## 2022-01-11 DIAGNOSIS — C251 Malignant neoplasm of body of pancreas: Secondary | ICD-10-CM | POA: Diagnosis not present

## 2022-01-11 LAB — CANCER ANTIGEN 19-9: CA 19-9: 429 U/mL — ABNORMAL HIGH (ref 0–35)

## 2022-01-11 MED ORDER — PEGFILGRASTIM-CBQV 6 MG/0.6ML ~~LOC~~ SOSY
6.0000 mg | PREFILLED_SYRINGE | Freq: Once | SUBCUTANEOUS | Status: AC
  Administered 2022-01-11: 6 mg via SUBCUTANEOUS
  Filled 2022-01-11: qty 0.6

## 2022-01-11 MED ORDER — HEPARIN SOD (PORK) LOCK FLUSH 100 UNIT/ML IV SOLN
500.0000 [IU] | Freq: Once | INTRAVENOUS | Status: AC | PRN
  Administered 2022-01-11: 500 [IU]

## 2022-01-11 MED ORDER — SODIUM CHLORIDE 0.9% FLUSH
10.0000 mL | INTRAVENOUS | Status: DC | PRN
  Administered 2022-01-11: 10 mL

## 2022-01-11 MED ORDER — SODIUM CHLORIDE 0.9 % IV SOLN: INTRAVENOUS | Status: AC

## 2022-01-20 ENCOUNTER — Other Ambulatory Visit: Payer: Self-pay | Admitting: Oncology

## 2022-01-22 ENCOUNTER — Inpatient Hospital Stay: Payer: BC Managed Care – PPO

## 2022-01-22 DIAGNOSIS — C251 Malignant neoplasm of body of pancreas: Secondary | ICD-10-CM

## 2022-01-22 LAB — CBC WITH DIFFERENTIAL (CANCER CENTER ONLY)
Abs Immature Granulocytes: 0.26 10*3/uL — ABNORMAL HIGH (ref 0.00–0.07)
Basophils Absolute: 0 10*3/uL (ref 0.0–0.1)
Basophils Relative: 0 %
Eosinophils Absolute: 0.2 10*3/uL (ref 0.0–0.5)
Eosinophils Relative: 1 %
HCT: 33.6 % — ABNORMAL LOW (ref 36.0–46.0)
Hemoglobin: 10.9 g/dL — ABNORMAL LOW (ref 12.0–15.0)
Immature Granulocytes: 2 %
Lymphocytes Relative: 17 %
Lymphs Abs: 2.2 10*3/uL (ref 0.7–4.0)
MCH: 32.7 pg (ref 26.0–34.0)
MCHC: 32.4 g/dL (ref 30.0–36.0)
MCV: 100.9 fL — ABNORMAL HIGH (ref 80.0–100.0)
Monocytes Absolute: 0.7 10*3/uL (ref 0.1–1.0)
Monocytes Relative: 6 %
Neutro Abs: 9.3 10*3/uL — ABNORMAL HIGH (ref 1.7–7.7)
Neutrophils Relative %: 74 %
Platelet Count: 167 10*3/uL (ref 150–400)
RBC: 3.33 MIL/uL — ABNORMAL LOW (ref 3.87–5.11)
RDW: 16 % — ABNORMAL HIGH (ref 11.5–15.5)
WBC Count: 12.7 10*3/uL — ABNORMAL HIGH (ref 4.0–10.5)
nRBC: 0 % (ref 0.0–0.2)

## 2022-01-22 LAB — CMP (CANCER CENTER ONLY)
ALT: 16 U/L (ref 0–44)
AST: 18 U/L (ref 15–41)
Albumin: 4.2 g/dL (ref 3.5–5.0)
Alkaline Phosphatase: 129 U/L — ABNORMAL HIGH (ref 38–126)
Anion gap: 9 (ref 5–15)
BUN: 15 mg/dL (ref 6–20)
CO2: 25 mmol/L (ref 22–32)
Calcium: 9.2 mg/dL (ref 8.9–10.3)
Chloride: 105 mmol/L (ref 98–111)
Creatinine: 0.57 mg/dL (ref 0.44–1.00)
GFR, Estimated: >60 mL/min (ref >60)
Glucose, Bld: 108 mg/dL — ABNORMAL HIGH (ref 70–99)
Potassium: 3.9 mmol/L (ref 3.5–5.1)
Sodium: 139 mmol/L (ref 135–145)
Total Bilirubin: 0.3 mg/dL (ref 0.3–1.2)
Total Protein: 6.8 g/dL (ref 6.5–8.1)

## 2022-01-23 LAB — CANCER ANTIGEN 19-9: CA 19-9: 530 U/mL — ABNORMAL HIGH (ref 0–35)

## 2022-01-24 ENCOUNTER — Other Ambulatory Visit: Payer: BC Managed Care – PPO

## 2022-01-24 ENCOUNTER — Other Ambulatory Visit: Payer: Self-pay

## 2022-01-24 ENCOUNTER — Inpatient Hospital Stay: Payer: BC Managed Care – PPO

## 2022-01-24 ENCOUNTER — Encounter: Payer: Self-pay | Admitting: Nurse Practitioner

## 2022-01-24 ENCOUNTER — Inpatient Hospital Stay (HOSPITAL_BASED_OUTPATIENT_CLINIC_OR_DEPARTMENT_OTHER): Payer: BC Managed Care – PPO | Admitting: Nurse Practitioner

## 2022-01-24 VITALS — BP 124/72 | HR 78 | Temp 98.2°F | Resp 20 | Ht 70.0 in | Wt 107.8 lb

## 2022-01-24 VITALS — BP 106/71 | HR 67

## 2022-01-24 DIAGNOSIS — C251 Malignant neoplasm of body of pancreas: Secondary | ICD-10-CM | POA: Diagnosis not present

## 2022-01-24 MED ORDER — DEXTROSE 5 % IV SOLN: Freq: Once | INTRAVENOUS | Status: AC

## 2022-01-24 MED ORDER — ATROPINE SULFATE 1 MG/ML IV SOLN
0.5000 mg | Freq: Once | INTRAVENOUS | Status: AC | PRN
  Administered 2022-01-24: 0.5 mg via INTRAVENOUS
  Filled 2022-01-24: qty 1

## 2022-01-24 MED ORDER — SODIUM CHLORIDE 0.9 % IV SOLN
400.0000 mg/m2 | Freq: Once | INTRAVENOUS | Status: AC
  Administered 2022-01-24: 624 mg via INTRAVENOUS
  Filled 2022-01-24: qty 31.2

## 2022-01-24 MED ORDER — SODIUM CHLORIDE 0.9 % IV SOLN
2400.0000 mg/m2 | INTRAVENOUS | Status: DC
  Administered 2022-01-24: 3750 mg via INTRAVENOUS
  Filled 2022-01-24: qty 75

## 2022-01-24 MED ORDER — OXALIPLATIN CHEMO INJECTION 100 MG/20ML
50.0000 mg/m2 | Freq: Once | INTRAVENOUS | Status: AC
  Administered 2022-01-24: 80 mg via INTRAVENOUS
  Filled 2022-01-24: qty 16

## 2022-01-24 MED ORDER — SODIUM CHLORIDE 0.9 % IV SOLN
10.0000 mg | Freq: Once | INTRAVENOUS | Status: AC
  Administered 2022-01-24: 10 mg via INTRAVENOUS
  Filled 2022-01-24: qty 10

## 2022-01-24 MED ORDER — SODIUM CHLORIDE 0.9 % IV SOLN
150.0000 mg | Freq: Once | INTRAVENOUS | Status: AC
  Administered 2022-01-24: 150 mg via INTRAVENOUS
  Filled 2022-01-24: qty 150

## 2022-01-24 MED ORDER — TRAMADOL HCL 50 MG PO TABS: 50.0000 mg | ORAL_TABLET | Freq: Four times a day (QID) | ORAL | 0 refills | Status: DC | PRN

## 2022-01-24 MED ORDER — PALONOSETRON HCL INJECTION 0.25 MG/5ML
0.2500 mg | Freq: Once | INTRAVENOUS | Status: AC
  Administered 2022-01-24: 0.25 mg via INTRAVENOUS
  Filled 2022-01-24: qty 5

## 2022-01-24 MED ORDER — SODIUM CHLORIDE 0.9 % IV SOLN
150.0000 mg/m2 | Freq: Once | INTRAVENOUS | Status: AC
  Administered 2022-01-24: 240 mg via INTRAVENOUS
  Filled 2022-01-24: qty 2

## 2022-01-24 MED ORDER — SODIUM CHLORIDE 0.9 % IV SOLN
400.0000 mg/m2 | Freq: Once | INTRAVENOUS | Status: DC
  Filled 2022-01-24: qty 31.2

## 2022-01-24 NOTE — Progress Notes (Signed)
[facility] OFFICE PROGRESS NOTE   Diagnosis: Pancreas cancer  INTERVAL HISTORY:   Ms. Alcindor returns as scheduled.  She completed a cycle of FOLFIRI 01/09/2022.  She denies nausea/vomit.  No mouth sores.  No diarrhea.  She has mild intermittent neuropathy symptoms in the hands and feet.  She reports worsening pain abdomen and back.  Some constipation.  She is taking tramadol on a scheduled basis, supplementing with Tylenol.  Objective:  Vital signs in last 24 hours:  Blood pressure 124/72, pulse 78, temperature 98.2 F (36.8 C), temperature source Oral, resp. rate 20, height _0  (1.778 m), weight 107 lb 12.8 oz (48.9 kg), last menstrual period 11/01/2018, SpO2 98 %.    HEENT: No thrush or ulcers. Resp: Lungs clear bilaterally. Cardio: Regular rate and rhythm. GI: Abdomen soft and nontender.  No hepatosplenomegaly.  No mass. Vascular: No leg edema. Neuro: Vibratory sense moderately decreased over the fingertips per tuning fork exam. Skin: Palms without erythema. Port-A-Cath without erythema.  Lab Results:  Lab Results  Component Value Date   WBC 12.7 (H) 01/22/2022   HGB 10.9 (L) 01/22/2022   HCT 33.6 (L) 01/22/2022   MCV 100.9 (H) 01/22/2022   PLT 167 01/22/2022   NEUTROABS 9.3 (H) 01/22/2022    Imaging:  No results found.  Medications: I have reviewed the patient's current medications.  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  Less than 180 degree abutment of the distal celiac, common hepatic, and splenic arteries, less than 180 degree abutment of the SMA CT chest 07/18/2021-no evidence of metastatic disease EUS 07/16/2021-extrinsic impression on the stomach at the posterior wall of the gastric body, no gross lesion in the  duodenum, 45 x 36 mm pancreas body mass, abutment of the SMA, celiac trunk, and compression of the splenoportal confluence.  No malignant appearing lymph nodes, numerous venous collaterals adjacent to the portal vein, FNA biopsy-malignant cells consistent with adenocarcinoma, T4N0 by EUS Markedly elevated CA 19-9 [employer] 07/31/2021-K-ras G12R, MSI high-not detected, ATM VUS Cycle 1 FOLFOX 08/08/2021 Cycle 2 FOLFIRINOX 08/22/2021 CT abdomen/pelvis at [facility] 08/31/2021-hypoattenuating liver lesions consistent with metastases, mass in the pancreas neck/body with greater than 100 degree encasement of the celiac axis and common hepatic artery.  Less than 180 degree abutment of the SMA, long segment occlusion of the portal splenic confluence, splenic vein occluded, multiple collateral vessels in the upper abdomen, prominent left periaortic node Cycle 3 FOLFIRINOX 09/05/2021 Cycle 4 FOLFIRINOX 09/19/2021 Cycle 5 FOLFIRINOX 10/03/2021 CTs 10/16/2021-decrease size of primary pancreas mass, hepatic metastases, and a suspicious retroperitoneal node, resolution of left supraclavicular/low jugular adenopathy compared to a CT chest 07/18/2021.  No evidence of disease progression. Cycle 6 FOLFIRINOX 10/17/2021 Cycle 7 FOLFIRINOX 10/31/2021 Cycle 8 FOLFIRINOX 11/14/2021 Cycle 9 FOLFIRINOX 11/28/2021 Cycle 10 FOLFIRINOX 12/12/2021, oxaliplatin held secondary to neuropathy symptoms CTs 12/20/2021-decrease size of pancreas primary, no evidence of hepatic metastases, no new or progressive disease Cycle 11 FOLFIRINOX 12/27/2021, oxaliplatin held due to neuropathy symptoms Cycle 12 FOLFIRINOX 01/09/2022, oxaliplatin held 01/22/2022-C19 9 higher, 530 Cycle 13 FOLFIRINOX 01/24/2022, oxaliplatin resumed Pain secondary to #1-controlled with tramadol and Tylenol. 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 Heslin appears stable.  She has most recently  been treated with FOLFIRI with oxaliplatin on hold due to neuropathy symptoms.  The CA 19-9 tumor marker has increased over the past several weeks.  She is having more pain.  We discussed resuming oxaliplatin.  She is in agreement with this plan.  Neuropathy symptoms are mild and intermittent at present.  She understands the neuropathy symptoms will likely worsen.  She is willing to accept this risk.  Plan to proceed with treatment today as scheduled, resume oxaliplatin at a reduced dose.  CBC and chemistry panel reviewed.  Labs adequate to proceed as above.  For pain she will continue tramadol as needed.  She will take Tylenol and ibuprofen between doses of tramadol if needed.  She has hydrocodone at home as well.  She will return for a repeat CA 19-9 on 02/01/2022.  We will see her in follow-up prior to chemotherapy on 02/06/2022.  She will contact the office in the interim with any problems.  Patient seen with Dr. Benay Spice.    Ned Card ANP/GNP-BC   01/24/2022  9:52 AM This was a shared visit with Ned Card.  We discussed pain management and a bowel regimen with Ms. [place].  The CA 19-9 is higher.  She has been maintained off of oxaliplatin for the last several cycles of chemotherapy.  We discussed resuming oxaliplatin.  She understands the chance of progressive neuropathy.  She agrees to proceed with oxaliplatin.  The oxaliplatin will be dose reduced.  We will plan for a restaging CT evaluation after a few more cycles of chemotherapy.  I was present for greater than 50% of today's visit.  I performed medical decision making.  Julieanne Manson, MD

## 2022-01-24 NOTE — Progress Notes (Signed)
Per Dr. Benay Spice, ok for patient's 5FU pump to be d/c'd early (at 1:30pm) on day of pump d/c 01/26/22. Ok to waste remaining medication, no bolus please.  Patient has requested to only receive 538m fluid on day of pump d/c. Per LNed Card NP, ok for patient to receive only 5066mfluids on day of pump d/c.

## 2022-01-24 NOTE — Patient Instructions (Addendum)
[facility]   The chemotherapy medication bag should finish at 46 hours, 96 hours, or 7 days. For example, if your pump is scheduled for 46 hours and it was put on at 4:00 p.m., it should finish at 2:00 p.m. the day it is scheduled to come off regardless of your appointment time.     Estimated time to finish at 12:30 Saturday, January 26, 2022.   If the display on your pump reads "Low Volume" and it is beeping, take the batteries out of the pump and come to the cancer center for it to be taken off.   If the pump alarms go off prior to the pump reading "Low Volume" then call [phone] and someone can assist you.  If the plunger comes out and the chemotherapy medication is leaking out, please use your home chemo spill kit to clean up the spill. Do NOT use paper towels or other household products.  If you have problems or questions regarding your pump, please call either [phone] (24 hours a day) or the cancer center Monday-Friday 8:00 a.m.- 4:30 p.m. at the clinic number and we will assist you. If you are unable to get assistance, then go to the nearest Emergency Department and ask the staff to contact the IV team for assistance.  Discharge Instructions: Thank you for choosing [facility] to provide your oncology and hematology care.   If you have a lab appointment with the [facility], please go directly to the [facility] and check in at the registration area.   Wear comfortable clothing and clothing appropriate for easy access to any Portacath or PICC line.   We strive to give you quality time with your provider. You may need to reschedule your appointment if you arrive late (15 or more minutes).  Arriving late affects you and other patients whose appointments are after yours.  Also, if you miss three or more appointments without notifying the office, you may be dismissed from the clinic at the provider's discretion.      For  prescription refill requests, have your pharmacy contact our office and allow 72 hours for refills to be completed.    Today you received the following chemotherapy and/or immunotherapy agents Oxaliplatin, Leucovorin, Irinotecan, Fluorouracil.      To help prevent nausea and vomiting after your treatment, we encourage you to take your nausea medication as directed.  BELOW ARE SYMPTOMS THAT SHOULD BE REPORTED IMMEDIATELY: *FEVER GREATER THAN 100.4 F (38 C) OR HIGHER *CHILLS OR SWEATING *NAUSEA AND VOMITING THAT IS NOT CONTROLLED WITH YOUR NAUSEA MEDICATION *UNUSUAL SHORTNESS OF BREATH *UNUSUAL BRUISING OR BLEEDING *URINARY PROBLEMS (pain or burning when urinating, or frequent urination) *BOWEL PROBLEMS (unusual diarrhea, constipation, pain near the anus) TENDERNESS IN MOUTH AND THROAT WITH OR WITHOUT PRESENCE OF ULCERS (sore throat, sores in mouth, or a toothache) UNUSUAL RASH, SWELLING OR PAIN  UNUSUAL VAGINAL DISCHARGE OR ITCHING   Items with * indicate a potential emergency and should be followed up as soon as possible or go to the Emergency Department if any problems should occur.  Please show the CHEMOTHERAPY ALERT CARD or IMMUNOTHERAPY ALERT CARD at check-in to the Emergency Department and triage nurse.  Should you have questions after your visit or need to cancel or reschedule your appointment, please contact [facility]  Dept: [phone]  and follow the prompts.  Office hours are 8:00 a.m. to 4:30 p.m. Monday - Friday. Please note that  voicemails left after 4:00 p.m. may not be returned until the following business day.  We are closed weekends and major holidays. You have access to a nurse at all times for urgent questions. Please call the main number to the clinic Dept: [phone] and follow the prompts.   For any non-urgent questions, you may also contact your provider using MyChart. We now offer e-Visits for anyone 72 and older to request care  online for non-urgent symptoms. For details visit [email or web address].   Also download the MyChart app! Go to the app store, search "MyChart", open the app, select [facility], and log in with your MyChart username and password.  Masks are optional in the cancer centers. If you would like for your care team to wear a mask while they are taking care of you, please let them know. You may have one support person who is at least 54 years old accompany you for your appointments.  Oxaliplatin Injection What is this medication? OXALIPLATIN (ox AL i PLA tin) treats some types of cancer. It works by slowing down the growth of cancer cells. This medicine may be used for other purposes; ask your health care provider or pharmacist if you have questions. COMMON BRAND NAME(S): Eloxatin What should I tell my care team before I take this medication? They need to know if you have any of these conditions: Heart disease History of irregular heartbeat or rhythm Liver disease Low blood cell levels (white cells, red cells, and platelets) Lung or breathing disease, such as asthma Take medications that treat or prevent blood clots Tingling of the fingers, toes, or other nerve disorder An unusual or allergic reaction to oxaliplatin, other medications, foods, dyes, or preservatives If you or your partner are pregnant or trying to get pregnant Breast-feeding How should I use this medication? This medication is injected into a vein. It is given by your care team in a hospital or clinic setting. Talk to your care team about the use of this medication in children. Special care may be needed. Overdosage: If you think you have taken too much of this medicine contact a poison control center or emergency room at once. NOTE: This medicine is only for you. Do not share this medicine with others. What if I miss a dose? Keep appointments for follow-up doses. It is important not to miss a dose. Call your care team if you  are unable to keep an appointment. What may interact with this medication? Do not take this medication with any of the following: Cisapride Dronedarone Pimozide Thioridazine This medication may also interact with the following: Aspirin and aspirin-like medications Certain medications that treat or prevent blood clots, such as warfarin, apixaban, dabigatran, and rivaroxaban Cisplatin Cyclosporine Diuretics Medications for infection, such as acyclovir, adefovir, amphotericin B, bacitracin, cidofovir, foscarnet, ganciclovir, gentamicin, pentamidine, vancomycin NSAIDs, medications for pain and inflammation, such as ibuprofen or naproxen Other medications that cause heart rhythm changes Pamidronate Zoledronic acid This list may not describe all possible interactions. Give your health care provider a list of all the medicines, herbs, non-prescription drugs, or dietary supplements you use. Also tell them if you smoke, drink alcohol, or use illegal drugs. Some items may interact with your medicine. What should I watch for while using this medication? Your condition will be monitored carefully while you are receiving this medication. You may need blood work while taking this medication. This medication may make you feel generally unwell. This is not uncommon as chemotherapy can affect healthy cells as well  as cancer cells. Report any side effects. Continue your course of treatment even though you feel ill unless your care team tells you to stop. This medication may increase your risk of getting an infection. Call your care team for advice if you get a fever, chills, sore throat, or other symptoms of a cold or flu. Do not treat yourself. Try to avoid being around people who are sick. Avoid taking medications that contain aspirin, acetaminophen, ibuprofen, naproxen, or ketoprofen unless instructed by your care team. These medications may hide a fever. Be careful brushing or flossing your teeth or using  a toothpick because you may get an infection or bleed more easily. If you have any dental work done, tell your dentist you are receiving this medication. This medication can make you more sensitive to cold. Do not drink cold drinks or use ice. Cover exposed skin before coming in contact with cold temperatures or cold objects. When out in cold weather wear warm clothing and cover your mouth and nose to warm the air that goes into your lungs. Tell your care team if you get sensitive to the cold. Talk to your care team if you or your partner are pregnant or think either of you might be pregnant. This medication can cause serious birth defects if taken during pregnancy and for 9 months after the last dose. A negative pregnancy test is required before starting this medication. A reliable form of contraception is recommended while taking this medication and for 9 months after the last dose. Talk to your care team about effective forms of contraception. Do not father a child while taking this medication and for 6 months after the last dose. Use a condom while having sex during this time period. Do not breastfeed while taking this medication and for 3 months after the last dose. This medication may cause infertility. Talk to your care team if you are concerned about your fertility. What side effects may I notice from receiving this medication? Side effects that you should report to your care team as soon as possible: Allergic reactions--skin rash, itching, hives, swelling of the face, lips, tongue, or throat Bleeding--bloody or black, tar-like stools, vomiting blood or brown material that looks like coffee grounds, red or dark brown urine, small red or purple spots on skin, unusual bruising or bleeding Dry cough, shortness of breath or trouble breathing Heart rhythm changes--fast or irregular heartbeat, dizziness, feeling faint or lightheaded, chest pain, trouble breathing Infection--fever, chills, cough, sore  throat, wounds that don't heal, pain or trouble when passing urine, general feeling of discomfort or being unwell Liver injury--right upper belly pain, loss of appetite, nausea, light-colored stool, dark yellow or brown urine, yellowing skin or eyes, unusual weakness or fatigue Low red blood cell level--unusual weakness or fatigue, dizziness, headache, trouble breathing Muscle injury--unusual weakness or fatigue, muscle pain, dark yellow or brown urine, decrease in amount of urine Pain, tingling, or numbness in the hands or feet Sudden and severe headache, confusion, change in vision, seizures, which may be signs of posterior reversible encephalopathy syndrome (PRES) Unusual bruising or bleeding Side effects that usually do not require medical attention (report to your care team if they continue or are bothersome): Diarrhea Nausea Pain, redness, or swelling with sores inside the mouth or throat Unusual weakness or fatigue Vomiting This list may not describe all possible side effects. Call your doctor for medical advice about side effects. You may report side effects to FDA at 1-800-FDA-1088. Where should I keep my  medication? This medication is given in a hospital or clinic. It will not be stored at home. NOTE: This sheet is a summary. It may not cover all possible information. If you have questions about this medicine, talk to your doctor, pharmacist, or health care provider.  2023 Elsevier/Gold Standard (2007-03-21 00:00:00)  Leucovorin Injection What is this medication? LEUCOVORIN (loo koe VOR in) prevents side effects from certain medications, such as methotrexate. It works by increasing folate levels. This helps protect healthy cells in your body. It may also be used to treat anemia caused by low levels of folate. It can also be used with fluorouracil, a type of chemotherapy, to treat colorectal cancer. It works by increasing the effects of fluorouracil in the body. This medicine may be  used for other purposes; ask your health care provider or pharmacist if you have questions. What should I tell my care team before I take this medication? They need to know if you have any of these conditions: Anemia from low levels of vitamin B12 in the blood An unusual or allergic reaction to leucovorin, folic acid, other medications, foods, dyes, or preservatives Pregnant or trying to get pregnant Breastfeeding How should I use this medication? This medication is injected into a vein or a muscle. It is given by your care team in a hospital or clinic setting. Talk to your care team about the use of this medication in children. Special care may be needed. Overdosage: If you think you have taken too much of this medicine contact a poison control center or emergency room at once. NOTE: This medicine is only for you. Do not share this medicine with others. What if I miss a dose? Keep appointments for follow-up doses. It is important not to miss your dose. Call your care team if you are unable to keep an appointment. What may interact with this medication? Capecitabine Fluorouracil Phenobarbital Phenytoin Primidone Trimethoprim;sulfamethoxazole This list may not describe all possible interactions. Give your health care provider a list of all the medicines, herbs, non-prescription drugs, or dietary supplements you use. Also tell them if you smoke, drink alcohol, or use illegal drugs. Some items may interact with your medicine. What should I watch for while using this medication? Your condition will be monitored carefully while you are receiving this medication. This medication may increase the side effects of 5-fluorouracil. Tell your care team if you have diarrhea or mouth sores that do not get better or that get worse. What side effects may I notice from receiving this medication? Side effects that you should report to your care team as soon as possible: Allergic reactions--skin rash,  itching, hives, swelling of the face, lips, tongue, or throat This list may not describe all possible side effects. Call your doctor for medical advice about side effects. You may report side effects to FDA at 1-800-FDA-1088. Where should I keep my medication? This medication is given in a hospital or clinic. It will not be stored at home. NOTE: This sheet is a summary. It may not cover all possible information. If you have questions about this medicine, talk to your doctor, pharmacist, or health care provider.  2023 Elsevier/Gold Standard (2021-07-03 00:00:00)  Irinotecan Injection What is this medication? IRINOTECAN (ir in oh TEE kan) treats some types of cancer. It works by slowing down the growth of cancer cells. This medicine may be used for other purposes; ask your health care provider or pharmacist if you have questions. COMMON BRAND NAME(S): Camptosar What should I  tell my care team before I take this medication? They need to know if you have any of these conditions: Dehydration Diarrhea Infection, especially a viral infection, such as chickenpox, cold sores, herpes Liver disease Low blood cell levels (white cells, red cells, and platelets) Low levels of electrolytes, such as calcium, magnesium, or potassium in your blood Recent or ongoing radiation An unusual or allergic reaction to irinotecan, other medications, foods, dyes, or preservatives If you or your partner are pregnant or trying to get pregnant Breast-feeding How should I use this medication? This medication is injected into a vein. It is given by your care team in a hospital or clinic setting. Talk to your care team about the use of this medication in children. Special care may be needed. Overdosage: If you think you have taken too much of this medicine contact a poison control center or emergency room at once. NOTE: This medicine is only for you. Do not share this medicine with others. What if I miss a dose? Keep  appointments for follow-up doses. It is important not to miss your dose. Call your care team if you are unable to keep an appointment. What may interact with this medication? Do not take this medication with any of the following: Cobicistat Itraconazole This medication may also interact with the following: Certain antibiotics, such as clarithromycin, rifampin, rifabutin Certain antivirals for HIV or AIDS Certain medications for fungal infections, such as ketoconazole, posaconazole, voriconazole Certain medications for seizures, such as carbamazepine, phenobarbital, phenytoin Gemfibrozil Nefazodone St. John's wort This list may not describe all possible interactions. Give your health care provider a list of all the medicines, herbs, non-prescription drugs, or dietary supplements you use. Also tell them if you smoke, drink alcohol, or use illegal drugs. Some items may interact with your medicine. What should I watch for while using this medication? Your condition will be monitored carefully while you are receiving this medication. You may need blood work while taking this medication. This medication may make you feel generally unwell. This is not uncommon as chemotherapy can affect healthy cells as well as cancer cells. Report any side effects. Continue your course of treatment even though you feel ill unless your care team tells you to stop. This medication can cause serious side effects. To reduce the risk, your care team may give you other medications to take before receiving this one. Be sure to follow the directions from your care team. This medication may affect your coordination, reaction time, or judgement. Do not drive or operate machinery until you know how this medication affects you. Sit up or stand slowly to reduce the risk of dizzy or fainting spells. Drinking alcohol with this medication can increase the risk of these side effects. This medication may increase your risk of getting an  infection. Call your care team for advice if you get a fever, chills, sore throat, or other symptoms of a cold or flu. Do not treat yourself. Try to avoid being around people who are sick. Avoid taking medications that contain aspirin, acetaminophen, ibuprofen, naproxen, or ketoprofen unless instructed by your care team. These medications may hide a fever. This medication may increase your risk to bruise or bleed. Call your care team if you notice any unusual bleeding. Be careful brushing or flossing your teeth or using a toothpick because you may get an infection or bleed more easily. If you have any dental work done, tell your dentist you are receiving this medication. Talk to your care team  if you or your partner are pregnant or think either of you might be pregnant. This medication can cause serious birth defects if taken during pregnancy and for 6 months after the last dose. You will need a negative pregnancy test before starting this medication. Contraception is recommended while taking this medication and for 6 months after the last dose. Your care team can help you find the option that works for you. Do not father a child while taking this medication and for 3 months after the last dose. Use a condom for contraception during this time period. Do not breastfeed while taking this medication and for 7 days after the last dose. This medication may cause infertility. Talk to your care team if you are concerned about your fertility. What side effects may I notice from receiving this medication? Side effects that you should report to your care team as soon as possible: Allergic reactions--skin rash, itching, hives, swelling of the face, lips, tongue, or throat Dry cough, shortness of breath or trouble breathing Increased saliva or tears, increased sweating, stomach cramping, diarrhea, small pupils, unusual weakness or fatigue, slow heartbeat Infection--fever, chills, cough, sore throat, wounds that  don't heal, pain or trouble when passing urine, general feeling of discomfort or being unwell Kidney injury--decrease in the amount of urine, swelling of the ankles, hands, or feet Low red blood cell level--unusual weakness or fatigue, dizziness, headache, trouble breathing Severe or prolonged diarrhea Unusual bruising or bleeding Side effects that usually do not require medical attention (report to your care team if they continue or are bothersome): Constipation Diarrhea Hair loss Loss of appetite Nausea Stomach pain This list may not describe all possible side effects. Call your doctor for medical advice about side effects. You may report side effects to FDA at 1-800-FDA-1088. Where should I keep my medication? This medication is given in a hospital or clinic. It will not be stored at home. NOTE: This sheet is a summary. It may not cover all possible information. If you have questions about this medicine, talk to your doctor, pharmacist, or health care provider.  2023 Elsevier/Gold Standard (2021-06-07 00:00:00)  Fluorouracil Injection What is this medication? FLUOROURACIL (flure oh YOOR a sil) treats some types of cancer. It works by slowing down the growth of cancer cells. This medicine may be used for other purposes; ask your health care provider or pharmacist if you have questions. COMMON BRAND NAME(S): Adrucil What should I tell my care team before I take this medication? They need to know if you have any of these conditions: Blood disorders Dihydropyrimidine dehydrogenase (DPD) deficiency Infection, such as chickenpox, cold sores, herpes Kidney disease Liver disease Poor nutrition Recent or ongoing radiation therapy An unusual or allergic reaction to fluorouracil, other medications, foods, dyes, or preservatives If you or your partner are pregnant or trying to get pregnant Breast-feeding How should I use this medication? This medication is injected into a vein. It is  administered by your care team in a hospital or clinic setting. Talk to your care team about the use of this medication in children. Special care may be needed. Overdosage: If you think you have taken too much of this medicine contact a poison control center or emergency room at once. NOTE: This medicine is only for you. Do not share this medicine with others. What if I miss a dose? Keep appointments for follow-up doses. It is important not to miss your dose. Call your care team if you are unable to keep an appointment.  What may interact with this medication? Do not take this medication with any of the following: Live virus vaccines This medication may also interact with the following: Medications that treat or prevent blood clots, such as warfarin, enoxaparin, dalteparin This list may not describe all possible interactions. Give your health care provider a list of all the medicines, herbs, non-prescription drugs, or dietary supplements you use. Also tell them if you smoke, drink alcohol, or use illegal drugs. Some items may interact with your medicine. What should I watch for while using this medication? Your condition will be monitored carefully while you are receiving this medication. This medication may make you feel generally unwell. This is not uncommon as chemotherapy can affect healthy cells as well as cancer cells. Report any side effects. Continue your course of treatment even though you feel ill unless your care team tells you to stop. In some cases, you may be given additional medications to help with side effects. Follow all directions for their use. This medication may increase your risk of getting an infection. Call your care team for advice if you get a fever, chills, sore throat, or other symptoms of a cold or flu. Do not treat yourself. Try to avoid being around people who are sick. This medication may increase your risk to bruise or bleed. Call your care team if you notice any  unusual bleeding. Be careful brushing or flossing your teeth or using a toothpick because you may get an infection or bleed more easily. If you have any dental work done, tell your dentist you are receiving this medication. Avoid taking medications that contain aspirin, acetaminophen, ibuprofen, naproxen, or ketoprofen unless instructed by your care team. These medications may hide a fever. Do not treat diarrhea with over the counter products. Contact your care team if you have diarrhea that lasts more than 2 days or if it is severe and watery. This medication can make you more sensitive to the sun. Keep out of the sun. If you cannot avoid being in the sun, wear protective clothing and sunscreen. Do not use sun lamps, tanning beds, or tanning booths. Talk to your care team if you or your partner wish to become pregnant or think you might be pregnant. This medication can cause serious birth defects if taken during pregnancy and for 3 months after the last dose. A reliable form of contraception is recommended while taking this medication and for 3 months after the last dose. Talk to your care team about effective forms of contraception. Do not father a child while taking this medication and for 3 months after the last dose. Use a condom while having sex during this time period. Do not breastfeed while taking this medication. This medication may cause infertility. Talk to your care team if you are concerned about your fertility. What side effects may I notice from receiving this medication? Side effects that you should report to your care team as soon as possible: Allergic reactions--skin rash, itching, hives, swelling of the face, lips, tongue, or throat Heart attack--pain or tightness in the chest, shoulders, arms, or jaw, nausea, shortness of breath, cold or clammy skin, feeling faint or lightheaded Heart failure--shortness of breath, swelling of the ankles, feet, or hands, sudden weight gain, unusual  weakness or fatigue Heart rhythm changes--fast or irregular heartbeat, dizziness, feeling faint or lightheaded, chest pain, trouble breathing High ammonia level--unusual weakness or fatigue, confusion, loss of appetite, nausea, vomiting, seizures Infection--fever, chills, cough, sore throat, wounds that don't heal, pain  or trouble when passing urine, general feeling of discomfort or being unwell Low red blood cell level--unusual weakness or fatigue, dizziness, headache, trouble breathing Pain, tingling, or numbness in the hands or feet, muscle weakness, change in vision, confusion or trouble speaking, loss of balance or coordination, trouble walking, seizures Redness, swelling, and blistering of the skin over hands and feet Severe or prolonged diarrhea Unusual bruising or bleeding Side effects that usually do not require medical attention (report to your care team if they continue or are bothersome): Dry skin Headache Increased tears Nausea Pain, redness, or swelling with sores inside the mouth or throat Sensitivity to light Vomiting This list may not describe all possible side effects. Call your doctor for medical advice about side effects. You may report side effects to FDA at 1-800-FDA-1088. Where should I keep my medication? This medication is given in a hospital or clinic. It will not be stored at home. NOTE: This sheet is a summary. It may not cover all possible information. If you have questions about this medicine, talk to your doctor, pharmacist, or health care provider.  2023 [employer] (2021-05-29 00:00:00)

## 2022-01-25 ENCOUNTER — Other Ambulatory Visit: Payer: Self-pay

## 2022-01-26 ENCOUNTER — Inpatient Hospital Stay: Payer: BC Managed Care – PPO

## 2022-01-26 VITALS — BP 114/64 | HR 64 | Temp 97.7°F | Resp 15 | Ht 70.0 in

## 2022-01-26 DIAGNOSIS — C251 Malignant neoplasm of body of pancreas: Secondary | ICD-10-CM | POA: Diagnosis not present

## 2022-01-26 MED ORDER — HEPARIN SOD (PORK) LOCK FLUSH 100 UNIT/ML IV SOLN
500.0000 [IU] | Freq: Once | INTRAVENOUS | Status: AC | PRN
  Administered 2022-01-26: 500 [IU]

## 2022-01-26 MED ORDER — SODIUM CHLORIDE 0.9 % IV SOLN: Freq: Once | INTRAVENOUS | Status: AC

## 2022-01-26 MED ORDER — PEGFILGRASTIM-CBQV 6 MG/0.6ML ~~LOC~~ SOSY
6.0000 mg | PREFILLED_SYRINGE | Freq: Once | SUBCUTANEOUS | Status: AC
  Administered 2022-01-26: 6 mg via SUBCUTANEOUS
  Filled 2022-01-26: qty 0.6

## 2022-01-26 MED ORDER — SODIUM CHLORIDE 0.9% FLUSH
10.0000 mL | INTRAVENOUS | Status: DC | PRN
  Administered 2022-01-26: 10 mL

## 2022-02-01 ENCOUNTER — Inpatient Hospital Stay: Payer: BC Managed Care – PPO

## 2022-02-01 DIAGNOSIS — C251 Malignant neoplasm of body of pancreas: Secondary | ICD-10-CM

## 2022-02-01 DIAGNOSIS — Z95828 Presence of other vascular implants and grafts: Secondary | ICD-10-CM

## 2022-02-01 LAB — CBC WITH DIFFERENTIAL (CANCER CENTER ONLY)
Abs Immature Granulocytes: 0.16 10*3/uL — ABNORMAL HIGH (ref 0.00–0.07)
Basophils Absolute: 0.1 10*3/uL (ref 0.0–0.1)
Basophils Relative: 0 %
Eosinophils Absolute: 0.2 10*3/uL (ref 0.0–0.5)
Eosinophils Relative: 1 %
HCT: 31.7 % — ABNORMAL LOW (ref 36.0–46.0)
Hemoglobin: 10 g/dL — ABNORMAL LOW (ref 12.0–15.0)
Immature Granulocytes: 1 %
Lymphocytes Relative: 11 %
Lymphs Abs: 2.1 10*3/uL (ref 0.7–4.0)
MCH: 31.9 pg (ref 26.0–34.0)
MCHC: 31.5 g/dL (ref 30.0–36.0)
MCV: 101.3 fL — ABNORMAL HIGH (ref 80.0–100.0)
Monocytes Absolute: 1.2 10*3/uL — ABNORMAL HIGH (ref 0.1–1.0)
Monocytes Relative: 7 %
Neutro Abs: 14.4 10*3/uL — ABNORMAL HIGH (ref 1.7–7.7)
Neutrophils Relative %: 80 %
Platelet Count: 140 10*3/uL — ABNORMAL LOW (ref 150–400)
RBC: 3.13 MIL/uL — ABNORMAL LOW (ref 3.87–5.11)
RDW: 15.9 % — ABNORMAL HIGH (ref 11.5–15.5)
WBC Count: 18 10*3/uL — ABNORMAL HIGH (ref 4.0–10.5)
nRBC: 0 % (ref 0.0–0.2)

## 2022-02-01 LAB — CMP (CANCER CENTER ONLY)
ALT: 14 U/L (ref 0–44)
AST: 12 U/L — ABNORMAL LOW (ref 15–41)
Albumin: 4.1 g/dL (ref 3.5–5.0)
Alkaline Phosphatase: 145 U/L — ABNORMAL HIGH (ref 38–126)
Anion gap: 9 (ref 5–15)
BUN: 16 mg/dL (ref 6–20)
CO2: 27 mmol/L (ref 22–32)
Calcium: 9 mg/dL (ref 8.9–10.3)
Chloride: 103 mmol/L (ref 98–111)
Creatinine: 0.55 mg/dL (ref 0.44–1.00)
GFR, Estimated: >60 mL/min (ref >60)
Glucose, Bld: 105 mg/dL — ABNORMAL HIGH (ref 70–99)
Potassium: 4 mmol/L (ref 3.5–5.1)
Sodium: 139 mmol/L (ref 135–145)
Total Bilirubin: 0.2 mg/dL — ABNORMAL LOW (ref 0.3–1.2)
Total Protein: 7 g/dL (ref 6.5–8.1)

## 2022-02-01 MED ORDER — HEPARIN SOD (PORK) LOCK FLUSH 100 UNIT/ML IV SOLN
500.0000 [IU] | Freq: Once | INTRAVENOUS | Status: AC
  Administered 2022-02-01: 500 [IU] via INTRAVENOUS

## 2022-02-01 MED ORDER — SODIUM CHLORIDE 0.9% FLUSH
10.0000 mL | INTRAVENOUS | Status: DC | PRN
  Administered 2022-02-01: 10 mL via INTRAVENOUS

## 2022-02-02 LAB — CANCER ANTIGEN 19-9: CA 19-9: 651 U/mL — ABNORMAL HIGH (ref 0–35)

## 2022-02-04 ENCOUNTER — Other Ambulatory Visit: Payer: Self-pay | Admitting: Oncology

## 2022-02-04 DIAGNOSIS — C251 Malignant neoplasm of body of pancreas: Secondary | ICD-10-CM

## 2022-02-06 ENCOUNTER — Inpatient Hospital Stay: Payer: BC Managed Care – PPO

## 2022-02-06 ENCOUNTER — Other Ambulatory Visit: Payer: Self-pay

## 2022-02-06 ENCOUNTER — Inpatient Hospital Stay (HOSPITAL_BASED_OUTPATIENT_CLINIC_OR_DEPARTMENT_OTHER): Payer: BC Managed Care – PPO | Admitting: Oncology

## 2022-02-06 VITALS — BP 118/81 | HR 84 | Temp 98.1°F | Resp 18 | Ht 70.0 in | Wt 109.2 lb

## 2022-02-06 DIAGNOSIS — C251 Malignant neoplasm of body of pancreas: Secondary | ICD-10-CM

## 2022-02-06 MED ORDER — OXALIPLATIN CHEMO INJECTION 100 MG/20ML
50.0000 mg/m2 | Freq: Once | INTRAVENOUS | Status: AC
  Administered 2022-02-06: 80 mg via INTRAVENOUS
  Filled 2022-02-06: qty 16

## 2022-02-06 MED ORDER — SODIUM CHLORIDE 0.9 % IV SOLN
150.0000 mg/m2 | Freq: Once | INTRAVENOUS | Status: AC
  Administered 2022-02-06: 240 mg via INTRAVENOUS
  Filled 2022-02-06: qty 2

## 2022-02-06 MED ORDER — SODIUM CHLORIDE 0.9 % IV SOLN
400.0000 mg/m2 | Freq: Once | INTRAVENOUS | Status: AC
  Administered 2022-02-06: 624 mg via INTRAVENOUS
  Filled 2022-02-06: qty 31.2

## 2022-02-06 MED ORDER — SODIUM CHLORIDE 0.9 % IV SOLN
150.0000 mg | Freq: Once | INTRAVENOUS | Status: AC
  Administered 2022-02-06: 150 mg via INTRAVENOUS
  Filled 2022-02-06: qty 5

## 2022-02-06 MED ORDER — ATROPINE SULFATE 1 MG/ML IV SOLN
0.5000 mg | Freq: Once | INTRAVENOUS | Status: AC | PRN
  Administered 2022-02-06: 0.5 mg via INTRAVENOUS
  Filled 2022-02-06: qty 1

## 2022-02-06 MED ORDER — SODIUM CHLORIDE 0.9 % IV SOLN
10.0000 mg | Freq: Once | INTRAVENOUS | Status: AC
  Administered 2022-02-06: 10 mg via INTRAVENOUS
  Filled 2022-02-06: qty 1

## 2022-02-06 MED ORDER — PALONOSETRON HCL INJECTION 0.25 MG/5ML
0.2500 mg | Freq: Once | INTRAVENOUS | Status: AC
  Administered 2022-02-06: 0.25 mg via INTRAVENOUS
  Filled 2022-02-06: qty 5

## 2022-02-06 MED ORDER — DEXTROSE 5 % IV SOLN: Freq: Once | INTRAVENOUS | Status: AC

## 2022-02-06 MED ORDER — SODIUM CHLORIDE 0.9 % IV SOLN
2400.0000 mg/m2 | INTRAVENOUS | Status: DC
  Administered 2022-02-06: 3750 mg via INTRAVENOUS
  Filled 2022-02-06: qty 75

## 2022-02-06 NOTE — Progress Notes (Signed)
Per Dr. Benay Spice, ok to treat with labs from 02/01/22.

## 2022-02-06 NOTE — Progress Notes (Signed)
Patient seen by Dr. Sherrill today ? ?Vitals are within treatment parameters. ? ?Labs reviewed by Dr. Sherrill and are within treatment parameters. ? ?Per physician team, patient is ready for treatment and there are NO modifications to the treatment plan.  ?

## 2022-02-06 NOTE — Patient Instructions (Addendum)
[facility]   The chemotherapy medication bag should finish at 46 hours, 96 hours, or 7 days. For example, if your pump is scheduled for 46 hours and it was put on at 4:00 p.m., it should finish at 2:00 p.m. the day it is scheduled to come off regardless of your appointment time.     Estimated time to finish at 1pm on Friday December 29th, 2023.   If the display on your pump reads "Low Volume" and it is beeping, take the batteries out of the pump and come to the cancer center for it to be taken off.   If the pump alarms go off prior to the pump reading "Low Volume" then call [phone] and someone can assist you.  If the plunger comes out and the chemotherapy medication is leaking out, please use your home chemo spill kit to clean up the spill. Do NOT use paper towels or other household products.  If you have problems or questions regarding your pump, please call either [phone] (24 hours a day) or the cancer center Monday-Friday 8:00 a.m.- 4:30 p.m. at the clinic number and we will assist you. If you are unable to get assistance, then go to the nearest Emergency Department and ask the staff to contact the IV team for assistance.  Discharge Instructions: Thank you for choosing [facility] to provide your oncology and hematology care.   If you have a lab appointment with the [facility], please go directly to the [facility] and check in at the registration area.   Wear comfortable clothing and clothing appropriate for easy access to any Portacath or PICC line.   We strive to give you quality time with your provider. You may need to reschedule your appointment if you arrive late (15 or more minutes).  Arriving late affects you and other patients whose appointments are after yours.  Also, if you miss three or more appointments without notifying the office, you may be dismissed from the clinic at the provider's discretion.      For  prescription refill requests, have your pharmacy contact our office and allow 72 hours for refills to be completed.    Today you received the following chemotherapy and/or immunotherapy agents Oxaliplatin, Leucovorin, Irinotecan, Fluorouracil.      To help prevent nausea and vomiting after your treatment, we encourage you to take your nausea medication as directed.  BELOW ARE SYMPTOMS THAT SHOULD BE REPORTED IMMEDIATELY: *FEVER GREATER THAN 100.4 F (38 C) OR HIGHER *CHILLS OR SWEATING *NAUSEA AND VOMITING THAT IS NOT CONTROLLED WITH YOUR NAUSEA MEDICATION *UNUSUAL SHORTNESS OF BREATH *UNUSUAL BRUISING OR BLEEDING *URINARY PROBLEMS (pain or burning when urinating, or frequent urination) *BOWEL PROBLEMS (unusual diarrhea, constipation, pain near the anus) TENDERNESS IN MOUTH AND THROAT WITH OR WITHOUT PRESENCE OF ULCERS (sore throat, sores in mouth, or a toothache) UNUSUAL RASH, SWELLING OR PAIN  UNUSUAL VAGINAL DISCHARGE OR ITCHING   Items with * indicate a potential emergency and should be followed up as soon as possible or go to the Emergency Department if any problems should occur.  Please show the CHEMOTHERAPY ALERT CARD or IMMUNOTHERAPY ALERT CARD at check-in to the Emergency Department and triage nurse.  Should you have questions after your visit or need to cancel or reschedule your appointment, please contact [facility]  Dept: [phone]  and follow the prompts.  Office hours are 8:00 a.m. to 4:30 p.m. Monday - Friday. Please note  that voicemails left after 4:00 p.m. may not be returned until the following business day.  We are closed weekends and major holidays. You have access to a nurse at all times for urgent questions. Please call the main number to the clinic Dept: [phone] and follow the prompts.   For any non-urgent questions, you may also contact your provider using MyChart. We now offer e-Visits for anyone 64 and older to request care  online for non-urgent symptoms. For details visit [email or web address].   Also download the MyChart app! Go to the app store, search "MyChart", open the app, select [facility], and log in with your MyChart username and password.  Masks are optional in the cancer centers. If you would like for your care team to wear a mask while they are taking care of you, please let them know. You may have one support person who is at least 54 years old accompany you for your appointments.  Oxaliplatin Injection What is this medication? OXALIPLATIN (ox AL i PLA tin) treats some types of cancer. It works by slowing down the growth of cancer cells. This medicine may be used for other purposes; ask your health care provider or pharmacist if you have questions. COMMON BRAND NAME(S): Eloxatin What should I tell my care team before I take this medication? They need to know if you have any of these conditions: Heart disease History of irregular heartbeat or rhythm Liver disease Low blood cell levels (white cells, red cells, and platelets) Lung or breathing disease, such as asthma Take medications that treat or prevent blood clots Tingling of the fingers, toes, or other nerve disorder An unusual or allergic reaction to oxaliplatin, other medications, foods, dyes, or preservatives If you or your partner are pregnant or trying to get pregnant Breast-feeding How should I use this medication? This medication is injected into a vein. It is given by your care team in a hospital or clinic setting. Talk to your care team about the use of this medication in children. Special care may be needed. Overdosage: If you think you have taken too much of this medicine contact a poison control center or emergency room at once. NOTE: This medicine is only for you. Do not share this medicine with others. What if I miss a dose? Keep appointments for follow-up doses. It is important not to miss a dose. Call your care team if you  are unable to keep an appointment. What may interact with this medication? Do not take this medication with any of the following: Cisapride Dronedarone Pimozide Thioridazine This medication may also interact with the following: Aspirin and aspirin-like medications Certain medications that treat or prevent blood clots, such as warfarin, apixaban, dabigatran, and rivaroxaban Cisplatin Cyclosporine Diuretics Medications for infection, such as acyclovir, adefovir, amphotericin B, bacitracin, cidofovir, foscarnet, ganciclovir, gentamicin, pentamidine, vancomycin NSAIDs, medications for pain and inflammation, such as ibuprofen or naproxen Other medications that cause heart rhythm changes Pamidronate Zoledronic acid This list may not describe all possible interactions. Give your health care provider a list of all the medicines, herbs, non-prescription drugs, or dietary supplements you use. Also tell them if you smoke, drink alcohol, or use illegal drugs. Some items may interact with your medicine. What should I watch for while using this medication? Your condition will be monitored carefully while you are receiving this medication. You may need blood work while taking this medication. This medication may make you feel generally unwell. This is not uncommon as chemotherapy can affect healthy cells as  well as cancer cells. Report any side effects. Continue your course of treatment even though you feel ill unless your care team tells you to stop. This medication may increase your risk of getting an infection. Call your care team for advice if you get a fever, chills, sore throat, or other symptoms of a cold or flu. Do not treat yourself. Try to avoid being around people who are sick. Avoid taking medications that contain aspirin, acetaminophen, ibuprofen, naproxen, or ketoprofen unless instructed by your care team. These medications may hide a fever. Be careful brushing or flossing your teeth or using  a toothpick because you may get an infection or bleed more easily. If you have any dental work done, tell your dentist you are receiving this medication. This medication can make you more sensitive to cold. Do not drink cold drinks or use ice. Cover exposed skin before coming in contact with cold temperatures or cold objects. When out in cold weather wear warm clothing and cover your mouth and nose to warm the air that goes into your lungs. Tell your care team if you get sensitive to the cold. Talk to your care team if you or your partner are pregnant or think either of you might be pregnant. This medication can cause serious birth defects if taken during pregnancy and for 9 months after the last dose. A negative pregnancy test is required before starting this medication. A reliable form of contraception is recommended while taking this medication and for 9 months after the last dose. Talk to your care team about effective forms of contraception. Do not father a child while taking this medication and for 6 months after the last dose. Use a condom while having sex during this time period. Do not breastfeed while taking this medication and for 3 months after the last dose. This medication may cause infertility. Talk to your care team if you are concerned about your fertility. What side effects may I notice from receiving this medication? Side effects that you should report to your care team as soon as possible: Allergic reactions--skin rash, itching, hives, swelling of the face, lips, tongue, or throat Bleeding--bloody or black, tar-like stools, vomiting blood or brown material that looks like coffee grounds, red or dark brown urine, small red or purple spots on skin, unusual bruising or bleeding Dry cough, shortness of breath or trouble breathing Heart rhythm changes--fast or irregular heartbeat, dizziness, feeling faint or lightheaded, chest pain, trouble breathing Infection--fever, chills, cough, sore  throat, wounds that don't heal, pain or trouble when passing urine, general feeling of discomfort or being unwell Liver injury--right upper belly pain, loss of appetite, nausea, light-colored stool, dark yellow or brown urine, yellowing skin or eyes, unusual weakness or fatigue Low red blood cell level--unusual weakness or fatigue, dizziness, headache, trouble breathing Muscle injury--unusual weakness or fatigue, muscle pain, dark yellow or brown urine, decrease in amount of urine Pain, tingling, or numbness in the hands or feet Sudden and severe headache, confusion, change in vision, seizures, which may be signs of posterior reversible encephalopathy syndrome (PRES) Unusual bruising or bleeding Side effects that usually do not require medical attention (report to your care team if they continue or are bothersome): Diarrhea Nausea Pain, redness, or swelling with sores inside the mouth or throat Unusual weakness or fatigue Vomiting This list may not describe all possible side effects. Call your doctor for medical advice about side effects. You may report side effects to FDA at 1-800-FDA-1088. Where should I keep  my medication? This medication is given in a hospital or clinic. It will not be stored at home. NOTE: This sheet is a summary. It may not cover all possible information. If you have questions about this medicine, talk to your doctor, pharmacist, or health care provider.  2023 Elsevier/Gold Standard (2007-03-21 00:00:00)  Leucovorin Injection What is this medication? LEUCOVORIN (loo koe VOR in) prevents side effects from certain medications, such as methotrexate. It works by increasing folate levels. This helps protect healthy cells in your body. It may also be used to treat anemia caused by low levels of folate. It can also be used with fluorouracil, a type of chemotherapy, to treat colorectal cancer. It works by increasing the effects of fluorouracil in the body. This medicine may be  used for other purposes; ask your health care provider or pharmacist if you have questions. What should I tell my care team before I take this medication? They need to know if you have any of these conditions: Anemia from low levels of vitamin B12 in the blood An unusual or allergic reaction to leucovorin, folic acid, other medications, foods, dyes, or preservatives Pregnant or trying to get pregnant Breastfeeding How should I use this medication? This medication is injected into a vein or a muscle. It is given by your care team in a hospital or clinic setting. Talk to your care team about the use of this medication in children. Special care may be needed. Overdosage: If you think you have taken too much of this medicine contact a poison control center or emergency room at once. NOTE: This medicine is only for you. Do not share this medicine with others. What if I miss a dose? Keep appointments for follow-up doses. It is important not to miss your dose. Call your care team if you are unable to keep an appointment. What may interact with this medication? Capecitabine Fluorouracil Phenobarbital Phenytoin Primidone Trimethoprim;sulfamethoxazole This list may not describe all possible interactions. Give your health care provider a list of all the medicines, herbs, non-prescription drugs, or dietary supplements you use. Also tell them if you smoke, drink alcohol, or use illegal drugs. Some items may interact with your medicine. What should I watch for while using this medication? Your condition will be monitored carefully while you are receiving this medication. This medication may increase the side effects of 5-fluorouracil. Tell your care team if you have diarrhea or mouth sores that do not get better or that get worse. What side effects may I notice from receiving this medication? Side effects that you should report to your care team as soon as possible: Allergic reactions--skin rash,  itching, hives, swelling of the face, lips, tongue, or throat This list may not describe all possible side effects. Call your doctor for medical advice about side effects. You may report side effects to FDA at 1-800-FDA-1088. Where should I keep my medication? This medication is given in a hospital or clinic. It will not be stored at home. NOTE: This sheet is a summary. It may not cover all possible information. If you have questions about this medicine, talk to your doctor, pharmacist, or health care provider.  2023 Elsevier/Gold Standard (2021-07-03 00:00:00)  Irinotecan Injection What is this medication? IRINOTECAN (ir in oh TEE kan) treats some types of cancer. It works by slowing down the growth of cancer cells. This medicine may be used for other purposes; ask your health care provider or pharmacist if you have questions. COMMON BRAND NAME(S): Camptosar What should  I tell my care team before I take this medication? They need to know if you have any of these conditions: Dehydration Diarrhea Infection, especially a viral infection, such as chickenpox, cold sores, herpes Liver disease Low blood cell levels (white cells, red cells, and platelets) Low levels of electrolytes, such as calcium, magnesium, or potassium in your blood Recent or ongoing radiation An unusual or allergic reaction to irinotecan, other medications, foods, dyes, or preservatives If you or your partner are pregnant or trying to get pregnant Breast-feeding How should I use this medication? This medication is injected into a vein. It is given by your care team in a hospital or clinic setting. Talk to your care team about the use of this medication in children. Special care may be needed. Overdosage: If you think you have taken too much of this medicine contact a poison control center or emergency room at once. NOTE: This medicine is only for you. Do not share this medicine with others. What if I miss a dose? Keep  appointments for follow-up doses. It is important not to miss your dose. Call your care team if you are unable to keep an appointment. What may interact with this medication? Do not take this medication with any of the following: Cobicistat Itraconazole This medication may also interact with the following: Certain antibiotics, such as clarithromycin, rifampin, rifabutin Certain antivirals for HIV or AIDS Certain medications for fungal infections, such as ketoconazole, posaconazole, voriconazole Certain medications for seizures, such as carbamazepine, phenobarbital, phenytoin Gemfibrozil Nefazodone St. John's wort This list may not describe all possible interactions. Give your health care provider a list of all the medicines, herbs, non-prescription drugs, or dietary supplements you use. Also tell them if you smoke, drink alcohol, or use illegal drugs. Some items may interact with your medicine. What should I watch for while using this medication? Your condition will be monitored carefully while you are receiving this medication. You may need blood work while taking this medication. This medication may make you feel generally unwell. This is not uncommon as chemotherapy can affect healthy cells as well as cancer cells. Report any side effects. Continue your course of treatment even though you feel ill unless your care team tells you to stop. This medication can cause serious side effects. To reduce the risk, your care team may give you other medications to take before receiving this one. Be sure to follow the directions from your care team. This medication may affect your coordination, reaction time, or judgement. Do not drive or operate machinery until you know how this medication affects you. Sit up or stand slowly to reduce the risk of dizzy or fainting spells. Drinking alcohol with this medication can increase the risk of these side effects. This medication may increase your risk of getting an  infection. Call your care team for advice if you get a fever, chills, sore throat, or other symptoms of a cold or flu. Do not treat yourself. Try to avoid being around people who are sick. Avoid taking medications that contain aspirin, acetaminophen, ibuprofen, naproxen, or ketoprofen unless instructed by your care team. These medications may hide a fever. This medication may increase your risk to bruise or bleed. Call your care team if you notice any unusual bleeding. Be careful brushing or flossing your teeth or using a toothpick because you may get an infection or bleed more easily. If you have any dental work done, tell your dentist you are receiving this medication. Talk to your care  team if you or your partner are pregnant or think either of you might be pregnant. This medication can cause serious birth defects if taken during pregnancy and for 6 months after the last dose. You will need a negative pregnancy test before starting this medication. Contraception is recommended while taking this medication and for 6 months after the last dose. Your care team can help you find the option that works for you. Do not father a child while taking this medication and for 3 months after the last dose. Use a condom for contraception during this time period. Do not breastfeed while taking this medication and for 7 days after the last dose. This medication may cause infertility. Talk to your care team if you are concerned about your fertility. What side effects may I notice from receiving this medication? Side effects that you should report to your care team as soon as possible: Allergic reactions--skin rash, itching, hives, swelling of the face, lips, tongue, or throat Dry cough, shortness of breath or trouble breathing Increased saliva or tears, increased sweating, stomach cramping, diarrhea, small pupils, unusual weakness or fatigue, slow heartbeat Infection--fever, chills, cough, sore throat, wounds that  don't heal, pain or trouble when passing urine, general feeling of discomfort or being unwell Kidney injury--decrease in the amount of urine, swelling of the ankles, hands, or feet Low red blood cell level--unusual weakness or fatigue, dizziness, headache, trouble breathing Severe or prolonged diarrhea Unusual bruising or bleeding Side effects that usually do not require medical attention (report to your care team if they continue or are bothersome): Constipation Diarrhea Hair loss Loss of appetite Nausea Stomach pain This list may not describe all possible side effects. Call your doctor for medical advice about side effects. You may report side effects to FDA at 1-800-FDA-1088. Where should I keep my medication? This medication is given in a hospital or clinic. It will not be stored at home. NOTE: This sheet is a summary. It may not cover all possible information. If you have questions about this medicine, talk to your doctor, pharmacist, or health care provider.  2023 Elsevier/Gold Standard (2021-06-07 00:00:00)  Fluorouracil Injection What is this medication? FLUOROURACIL (flure oh YOOR a sil) treats some types of cancer. It works by slowing down the growth of cancer cells. This medicine may be used for other purposes; ask your health care provider or pharmacist if you have questions. COMMON BRAND NAME(S): Adrucil What should I tell my care team before I take this medication? They need to know if you have any of these conditions: Blood disorders Dihydropyrimidine dehydrogenase (DPD) deficiency Infection, such as chickenpox, cold sores, herpes Kidney disease Liver disease Poor nutrition Recent or ongoing radiation therapy An unusual or allergic reaction to fluorouracil, other medications, foods, dyes, or preservatives If you or your partner are pregnant or trying to get pregnant Breast-feeding How should I use this medication? This medication is injected into a vein. It is  administered by your care team in a hospital or clinic setting. Talk to your care team about the use of this medication in children. Special care may be needed. Overdosage: If you think you have taken too much of this medicine contact a poison control center or emergency room at once. NOTE: This medicine is only for you. Do not share this medicine with others. What if I miss a dose? Keep appointments for follow-up doses. It is important not to miss your dose. Call your care team if you are unable to keep an  appointment. What may interact with this medication? Do not take this medication with any of the following: Live virus vaccines This medication may also interact with the following: Medications that treat or prevent blood clots, such as warfarin, enoxaparin, dalteparin This list may not describe all possible interactions. Give your health care provider a list of all the medicines, herbs, non-prescription drugs, or dietary supplements you use. Also tell them if you smoke, drink alcohol, or use illegal drugs. Some items may interact with your medicine. What should I watch for while using this medication? Your condition will be monitored carefully while you are receiving this medication. This medication may make you feel generally unwell. This is not uncommon as chemotherapy can affect healthy cells as well as cancer cells. Report any side effects. Continue your course of treatment even though you feel ill unless your care team tells you to stop. In some cases, you may be given additional medications to help with side effects. Follow all directions for their use. This medication may increase your risk of getting an infection. Call your care team for advice if you get a fever, chills, sore throat, or other symptoms of a cold or flu. Do not treat yourself. Try to avoid being around people who are sick. This medication may increase your risk to bruise or bleed. Call your care team if you notice any  unusual bleeding. Be careful brushing or flossing your teeth or using a toothpick because you may get an infection or bleed more easily. If you have any dental work done, tell your dentist you are receiving this medication. Avoid taking medications that contain aspirin, acetaminophen, ibuprofen, naproxen, or ketoprofen unless instructed by your care team. These medications may hide a fever. Do not treat diarrhea with over the counter products. Contact your care team if you have diarrhea that lasts more than 2 days or if it is severe and watery. This medication can make you more sensitive to the sun. Keep out of the sun. If you cannot avoid being in the sun, wear protective clothing and sunscreen. Do not use sun lamps, tanning beds, or tanning booths. Talk to your care team if you or your partner wish to become pregnant or think you might be pregnant. This medication can cause serious birth defects if taken during pregnancy and for 3 months after the last dose. A reliable form of contraception is recommended while taking this medication and for 3 months after the last dose. Talk to your care team about effective forms of contraception. Do not father a child while taking this medication and for 3 months after the last dose. Use a condom while having sex during this time period. Do not breastfeed while taking this medication. This medication may cause infertility. Talk to your care team if you are concerned about your fertility. What side effects may I notice from receiving this medication? Side effects that you should report to your care team as soon as possible: Allergic reactions--skin rash, itching, hives, swelling of the face, lips, tongue, or throat Heart attack--pain or tightness in the chest, shoulders, arms, or jaw, nausea, shortness of breath, cold or clammy skin, feeling faint or lightheaded Heart failure--shortness of breath, swelling of the ankles, feet, or hands, sudden weight gain, unusual  weakness or fatigue Heart rhythm changes--fast or irregular heartbeat, dizziness, feeling faint or lightheaded, chest pain, trouble breathing High ammonia level--unusual weakness or fatigue, confusion, loss of appetite, nausea, vomiting, seizures Infection--fever, chills, cough, sore throat, wounds that don't heal,  pain or trouble when passing urine, general feeling of discomfort or being unwell Low red blood cell level--unusual weakness or fatigue, dizziness, headache, trouble breathing Pain, tingling, or numbness in the hands or feet, muscle weakness, change in vision, confusion or trouble speaking, loss of balance or coordination, trouble walking, seizures Redness, swelling, and blistering of the skin over hands and feet Severe or prolonged diarrhea Unusual bruising or bleeding Side effects that usually do not require medical attention (report to your care team if they continue or are bothersome): Dry skin Headache Increased tears Nausea Pain, redness, or swelling with sores inside the mouth or throat Sensitivity to light Vomiting This list may not describe all possible side effects. Call your doctor for medical advice about side effects. You may report side effects to FDA at 1-800-FDA-1088. Where should I keep my medication? This medication is given in a hospital or clinic. It will not be stored at home. NOTE: This sheet is a summary. It may not cover all possible information. If you have questions about this medicine, talk to your doctor, pharmacist, or health care provider.  2023 Elsevier/Gold Standard (2021-05-29 00:00:00)

## 2022-02-06 NOTE — Progress Notes (Signed)
[facility] OFFICE PROGRESS NOTE   Diagnosis: Pancreas cancer  INTERVAL HISTORY:   Tara Jensen returns as scheduled.  She completed another cycle FOLFIRINOX on 01/24/2022.  She reports increased malaise following this cycle of chemotherapy.  Numbness in the fingers and toes has progressed.  This does not interfere with activity.  She has increased lower abdomen and back pain.  She takes tramadol 4 times per day and intermittent Tylenol.  She has been constipated for the past 6 days.  Good appetite.  No nausea or vomiting.  Objective:  Vital signs in last 24 hours:  Blood pressure 118/81, pulse 84, temperature 98.1 F (36.7 C), temperature source Oral, resp. rate 18, height _0  (1.778 m), weight 109 lb 3.2 oz (49.5 kg), last menstrual period 11/01/2018, SpO2 99 %.    HEENT: No thrush or ulcers Resp: Lungs clear bilaterally Cardio: Regular rate and rhythm GI: No mass, nontender, no hepatosplenomegaly Vascular: No leg edema Neuro: Moderate to severe loss of vibratory sense at the fingertips bilaterally   Portacath/PICC-without erythema  Lab Results:  Lab Results  Component Value Date   WBC 18.0 (H) 02/01/2022   HGB 10.0 (L) 02/01/2022   HCT 31.7 (L) 02/01/2022   MCV 101.3 (H) 02/01/2022   PLT 140 (L) 02/01/2022   NEUTROABS 14.4 (H) 02/01/2022    CMP  Lab Results  Component Value Date   NA 139 02/01/2022   K 4.0 02/01/2022   CL 103 02/01/2022   CO2 27 02/01/2022   GLUCOSE 105 (H) 02/01/2022   BUN 16 02/01/2022   CREATININE 0.55 02/01/2022   CALCIUM 9.0 02/01/2022   PROT 7.0 02/01/2022   ALBUMIN 4.1 02/01/2022   AST 12 (L) 02/01/2022   ALT 14 02/01/2022   ALKPHOS 145 (H) 02/01/2022   BILITOT 0.2 (L) 02/01/2022   GFRNONAA >60 02/01/2022   GFRAA  12/27/2021    QUESTIONABLE RESULTS, RECOMMEND RECOLLECT TO VERIFY    Lab Results  Component Value Date   CAN199 651 (H) 02/01/2022     Medications: I have reviewed the patient's current  medications.   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  Less than 180 degree abutment of the distal celiac, common hepatic, and splenic arteries, less than 180 degree abutment of the SMA CT chest 07/18/2021-no evidence of metastatic disease EUS 07/16/2021-extrinsic impression on the stomach at the posterior wall of the gastric body, no gross lesion in the duodenum, 45 x 36 mm pancreas body mass, abutment of the SMA, celiac trunk, and compression of the splenoportal confluence.  No malignant appearing lymph nodes, numerous venous collaterals adjacent to the portal vein, FNA biopsy-malignant cells consistent with adenocarcinoma, T4N0 by EUS Markedly elevated CA 19-9 [employer] 07/31/2021-K-ras G12R, MSI high-not detected, ATM VUS Cycle 1 FOLFOX 08/08/2021 Cycle 2 FOLFIRINOX 08/22/2021 CT abdomen[facility]pelvis at [facility] 08/31/2021-hypoattenuating liver lesions consistent with metastases, mass in the pancreas neck/body with greater than 100 degree encasement of the celiac axis and common hepatic artery.  Less than 180 degree abutment of the SMA, long segment occlusion of the portal splenic confluence, splenic vein occluded, multiple collateral vessels in the upper abdomen, prominent left periaortic node Cycle 3 FOLFIRINOX 09/05/2021 Cycle 4 FOLFIRINOX 09/19/2021 Cycle 5 FOLFIRINOX 10/03/2021 CTs 10/16/2021-decrease size of primary pancreas mass, hepatic metastases, and a suspicious retroperitoneal node, resolution of left supraclavicular/low jugular  adenopathy compared to a CT chest 07/18/2021.  No evidence of disease progression. Cycle 6 FOLFIRINOX 10/17/2021 Cycle 7 FOLFIRINOX 10/31/2021 Cycle 8 FOLFIRINOX 11/14/2021 Cycle 9 FOLFIRINOX 11/28/2021 Cycle 10 FOLFIRINOX 12/12/2021, oxaliplatin held secondary  to neuropathy symptoms CTs 12/20/2021-decrease size of pancreas primary, no evidence of hepatic metastases, no new or progressive disease Cycle 11 FOLFIRINOX 12/27/2021, oxaliplatin held due to neuropathy symptoms Cycle 12 FOLFIRINOX 01/09/2022, oxaliplatin held 01/22/2022-C19-9 higher, 530 Cycle 13 FOLFIRINOX 01/24/2022, oxaliplatin resumed Cycle 14 FOLFIRINOX 02/06/2022 Pain secondary to #1-controlled with tramadol and Tylenol. 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  She continues treatment with FOLFIRINOX.  The CA 19-9-9 was slightly higher on 02/01/2022.  We discussed the indication for continuing FOLFIRINOX.  She understands the potential for worsening and long-lasting neuropathy with continuing oxaliplatin.  She has received only 1 cycle since oxaliplatin was resumed.  She agrees to continue treatment with oxaliplatin.  I recommended she begin MiraLAX for constipation.  She will contact us if the constipation is not relieved with MiraLAX.  She will return for an office visit and chemotherapy in 2 weeks.  We will schedule a restaging CT after the next cycle of chemotherapy.  She will continue the current narcotic pain regimen.    Betsy Coder, MD  02/06/2022  9:59 AM

## 2022-02-07 ENCOUNTER — Other Ambulatory Visit: Payer: Self-pay

## 2022-02-08 ENCOUNTER — Other Ambulatory Visit: Payer: Self-pay | Admitting: Nurse Practitioner

## 2022-02-08 ENCOUNTER — Inpatient Hospital Stay: Payer: BC Managed Care – PPO

## 2022-02-08 ENCOUNTER — Other Ambulatory Visit: Payer: Self-pay | Admitting: *Deleted

## 2022-02-08 VITALS — BP 112/82 | HR 67 | Temp 98.7°F | Resp 18

## 2022-02-08 DIAGNOSIS — C251 Malignant neoplasm of body of pancreas: Secondary | ICD-10-CM | POA: Diagnosis not present

## 2022-02-08 MED ORDER — PEGFILGRASTIM-CBQV 6 MG/0.6ML ~~LOC~~ SOSY
6.0000 mg | PREFILLED_SYRINGE | Freq: Once | SUBCUTANEOUS | Status: AC
  Administered 2022-02-08: 6 mg via SUBCUTANEOUS
  Filled 2022-02-08: qty 0.6

## 2022-02-08 MED ORDER — SODIUM CHLORIDE 0.9 % IV SOLN: Freq: Once | INTRAVENOUS | Status: AC

## 2022-02-08 MED ORDER — HEPARIN SOD (PORK) LOCK FLUSH 100 UNIT/ML IV SOLN
500.0000 [IU] | Freq: Once | INTRAVENOUS | Status: AC | PRN
  Administered 2022-02-08: 500 [IU]

## 2022-02-08 MED ORDER — DEXAMETHASONE 4 MG PO TABS: 4.0000 mg | ORAL_TABLET | Freq: Two times a day (BID) | ORAL | 0 refills | Status: DC

## 2022-02-08 MED ORDER — SERTRALINE HCL 25 MG PO TABS: 25.0000 mg | ORAL_TABLET | Freq: Every day | ORAL | 1 refills | Status: DC

## 2022-02-08 MED ORDER — TRAMADOL HCL 50 MG PO TABS: 50.0000 mg | ORAL_TABLET | Freq: Four times a day (QID) | ORAL | 0 refills | Status: DC | PRN

## 2022-02-08 MED ORDER — SODIUM CHLORIDE 0.9% FLUSH
10.0000 mL | INTRAVENOUS | Status: DC | PRN
  Administered 2022-02-08: 10 mL

## 2022-02-08 NOTE — Patient Instructions (Signed)
Implanted Port Home Guide An implanted port is a device that is placed under the skin. It is usually placed in the chest. The device may vary based on the need. Implanted ports can be used to give IV medicine, to take blood, or to give fluids. You may have an implanted port if: You need IV medicine that would be irritating to the small veins in your hands or arms. You need IV medicines, such as chemotherapy, for a long period of time. You need IV nutrition for a long period of time. You may have fewer limitations when using a port than you would if you used other types of long-term IVs. You will also likely be able to return to normal activities after your incision heals. An implanted port has two main parts: Reservoir. The reservoir is the part where a needle is inserted to give medicines or draw blood. The reservoir is round. After the port is placed, it appears as a small, raised area under your skin. Catheter. The catheter is a small, thin tube that connects the reservoir to a vein. Medicine that is inserted into the reservoir goes into the catheter and then into the vein. How is my port accessed? To access your port: A numbing cream may be placed on the skin over the port site. Your health care provider will put on a mask and sterile gloves. The skin over your port will be cleaned carefully with a germ-killing soap and allowed to dry. Your health care provider will gently pinch the port and insert a needle into it. Your health care provider will check for a blood return to make sure the port is in the vein and is still working (patent). If your port needs to remain accessed to get medicine continuously (constant infusion), your health care provider will place a clear bandage (dressing) over the needle site. The dressing and needle will need to be changed every week, or as told by your health care provider. What is flushing? Flushing helps keep the port working. Follow instructions from your  health care provider about how and when to flush the port. Ports are usually flushed with saline solution or a medicine called heparin. The need for flushing will depend on how the port is used: If the port is only used from time to time to give medicines or draw blood, the port may need to be flushed: Before and after medicines have been given. Before and after blood has been drawn. As part of routine maintenance. Flushing may be recommended every 4-6 weeks. If a constant infusion is running, the port may not need to be flushed. Throw away any syringes in a disposal container that is meant for sharp items (sharps container). You can buy a sharps container from a pharmacy, or you can make one by using an empty hard plastic bottle with a cover. How long will my port stay implanted? The port can stay in for as long as your health care provider thinks it is needed. When it is time for the port to come out, a surgery will be done to remove it. The surgery will be similar to the procedure that was done to put the port in. Follow these instructions at home: Caring for your port and port site Flush your port as told by your health care provider. If you need an infusion over several days, follow instructions from your health care provider about how to take care of your port site. Make sure you: Change your   dressing as told by your health care provider. Wash your hands with soap and water for at least 20 seconds before and after you change your dressing. If soap and water are not available, use alcohol-based hand sanitizer. Place any used dressings or infusion bags into a plastic bag. Throw that bag in the trash. Keep the dressing that covers the needle clean and dry. Do not get it wet. Do not use scissors or sharp objects near the infusion tubing. Keep any external tubes clamped, unless they are being used. Check your port site every day for signs of infection. Check for: Redness, swelling, or  pain. Fluid or blood. Warmth. Pus or a bad smell. Protect the skin around the port site. Avoid wearing bra straps that rub or irritate the site. Protect the skin around your port from seat belts. Place a soft pad over your chest if needed. Bathe or shower as told by your health care provider. The site may get wet as long as you are not actively receiving an infusion. General instructions  Return to your normal activities as told by your health care provider. Ask your health care provider what activities are safe for you. Carry a medical alert card or wear a medical alert bracelet at all times. This will let health care providers know that you have an implanted port in case of an emergency. Where to find more information American Cancer Society: www.cancer.[place] of Clinical Oncology: www.cancer.net Contact a health care provider if: You have a fever or chills. You have redness, swelling, or pain at the port site. You have fluid or blood coming from your port site. Your incision feels warm to the touch. You have pus or a bad smell coming from the port site. Summary Implanted ports are usually placed in the chest for long-term IV access. Follow instructions from your health care provider about flushing the port and changing bandages (dressings). Take care of the area around your port by avoiding clothing that puts pressure on the area, and by watching for signs of infection. Protect the skin around your port from seat belts. Place a soft pad over your chest if needed. Contact a health care provider if you have a fever or you have redness, swelling, pain, fluid, or a bad smell at the port site. This information is not intended to replace advice given to you by your health care provider. Make sure you discuss any questions you have with your health care provider. Document Revised: 08/01/2020 Document Reviewed: 08/01/2020 Elsevier Patient Education  [place].  Rehydration, Adult Rehydration is the replacement of fluids, salts, and minerals in the body (electrolytes) that are lost during dehydration. Dehydration is when there is not enough water or other fluids in the body. This happens when you lose more fluids than you take in. Common causes of dehydration include: Not drinking enough fluids. This can occur when you are ill or doing activities that require a lot of energy, especially in hot weather. Conditions that cause loss of water or other fluids. These include diarrhea, vomiting, sweating, and urinating a lot. Other illnesses, such as fever or infection. Certain medicines, such as those that remove excess fluid from the body (diuretics). Symptoms of mild or moderate dehydration may include thirst, dry lips and mouth, and dizziness. Symptoms of severe dehydration may include increased heart rate, confusion, fainting, and not urinating. In severe cases, you may need to get fluids through an IV at the hospital. For mild or moderate  cases, you can usually rehydrate at home by drinking certain fluids as told by your health care provider. What are the risks? Your health care provider will talk with you about risks. Your health care provider will talk with you about risks. This may include taking in too much fluid (overhydration). This is rare. Overhydration can cause an imbalance of electrolytes in the body, kidney failure, or a decrease in salt (sodium) levels in the body. Supplies needed: You will need an oral rehydration solution (ORS) if your health care provider tells you to use one. This is a drink to treat dehydration. It can be found in pharmacies and retail stores. How to rehydrate Fluids Follow instructions from your health care provider about what to drink. The kind of fluid and the amount you should drink depend on your condition. In general, you should choose drinks that you prefer. If told by your health care provider, drink an  ORS. Make an ORS by following instructions on the package. Start by drinking small amounts, about  cup (120 mL) every 5-10 minutes. Slowly increase how much you drink until you have taken in the amount recommended by your health care provider. Drink enough clear fluids to keep your urine pale yellow. If you were told to drink an ORS, finish it first, then start slowly drinking other clear fluids. Drink fluids such as: Water. This includes sparkling and flavored water. Drinking only water can lead to having too little sodium in your body (hyponatremia). Follow the advice of your health care provider. Water from ice chips you suck on. Fruit juice with water added to it (diluted). Sports drinks. Hot or cold herbal teas. Broth-based soups. Milk or milk products. Food Follow instructions from your health care provider about what to eat while you rehydrate. Your health care provider may recommend that you slowly begin eating regular foods in small amounts. Eat foods that contain a healthy balance of electrolytes, such as bananas, oranges, potatoes, tomatoes, and spinach. Avoid foods that are greasy or contain a lot of sugar. In some cases, you may get nutrition through a feeding tube that is passed through your nose and into your stomach (nasogastric tube, or NG tube). This may be done if you have uncontrolled vomiting or diarrhea. Drinks to avoid  Certain drinks may make dehydration worse. While you rehydrate, avoid drinking alcohol. How to tell if you are recovering from dehydration You may be getting better if: You are urinating more often than before you started rehydrating. Your urine is pale yellow. Your energy level improves. You vomit less often. You have diarrhea less often. Your appetite improves or returns to normal. You feel less dizzy or light-headed. Your skin tone and color start to look more normal. Follow these instructions at home: Take over-the-counter and prescription  medicines only as told by your health care provider. Do not take sodium tablets. Doing this can lead to having too much sodium in your body (hypernatremia). Contact a health care provider if: You continue to have symptoms of mild or moderate dehydration, such as: Thirst. Dry lips. Slightly dry mouth. Dizziness. Dark urine or less urine than normal. Muscle cramps. You continue to vomit or have diarrhea. Get help right away if: You have symptoms of dehydration that get worse. You have a fever. You have a severe headache. You have been vomiting and have problems, such as: Your vomiting gets worse or does not go away. Your vomit includes blood or green matter (bile). You cannot eat or drink  without vomiting. You have problems with urination or bowel movements, such as: Diarrhea that gets worse or does not go away. Blood in your stool (feces). This may cause stool to look black and tarry. Not urinating, or urinating only a small amount of very dark urine, within 6-8 hours. You have trouble breathing. You have symptoms that get worse with treatment. These symptoms may be an emergency. Get help right away. Call 911. Do not wait to see if the symptoms will go away. Do not drive yourself to the hospital. This information is not intended to replace advice given to you by your health care provider. Make sure you discuss any questions you have with your health care provider. Document Revised: 06/11/2021 Document Reviewed: 06/11/2021 Elsevier Patient Education  [place].  Pegfilgrastim Injection What is this medication? PEGFILGRASTIM (PEG fil gra stim) lowers the risk of infection in people who are receiving chemotherapy. It works by Building control surveyor make more white blood cells, which protects your body from infection. It may also be used to help people who have been exposed to high doses of radiation. This medicine may be used for other purposes; ask your health care provider or  pharmacist if you have questions. COMMON BRAND NAME(S): [place], Neulasta, Nyvepria, Stimufend, UDENYCA, Ziextenzo What should I tell my care team before I take this medication? They need to know if you have any of these conditions: Kidney disease Latex allergy Ongoing radiation therapy Sickle cell disease Skin reactions to acrylic adhesives (On-Body Injector only) An unusual or allergic reaction to pegfilgrastim, filgrastim, other medications, foods, dyes, or preservatives Pregnant or trying to get pregnant Breast-feeding How should I use this medication? This medication is for injection under the skin. If you get this medication at home, you will be taught how to prepare and give the pre-filled syringe or how to use the On-body Injector. Refer to the patient Instructions for Use for detailed instructions. Use exactly as directed. Tell your care team immediately if you suspect that the On-body Injector may not have performed as intended or if you suspect the use of the On-body Injector resulted in a missed or partial dose. It is important that you put your used needles and syringes in a special sharps container. Do not put them in a trash can. If you do not have a sharps container, call your pharmacist or care team to get one. Talk to your care team about the use of this medication in children. While this medication may be prescribed for selected conditions, precautions do apply. Overdosage: If you think you have taken too much of this medicine contact a poison control center or emergency room at once. NOTE: This medicine is only for you. Do not share this medicine with others. What if I miss a dose? It is important not to miss your dose. Call your care team if you miss your dose. If you miss a dose due to an On-body Injector failure or leakage, a new dose should be administered as soon as possible using a single prefilled syringe for manual use. What may interact with this  medication? Interactions have not been studied. This list may not describe all possible interactions. Give your health care provider a list of all the medicines, herbs, non-prescription drugs, or dietary supplements you use. Also tell them if you smoke, drink alcohol, or use illegal drugs. Some items may interact with your medicine. What should I watch for while using this medication? Your condition will be monitored  carefully while you are receiving this medication. You may need blood work done while you are taking this medication. Talk to your care team about your risk of cancer. You may be more at risk for certain types of cancer if you take this medication. If you are going to need a MRI, CT scan, or other procedure, tell your care team that you are using this medication (On-Body Injector only). What side effects may I notice from receiving this medication? Side effects that you should report to your care team as soon as possible: Allergic reactions--skin rash, itching, hives, swelling of the face, lips, tongue, or throat Capillary leak syndrome--stomach or muscle pain, unusual weakness or fatigue, feeling faint or lightheaded, decrease in the amount of urine, swelling of the ankles, hands, or feet, trouble breathing High white blood cell level--fever, fatigue, trouble breathing, night sweats, change in vision, weight loss Inflammation of the aorta--fever, fatigue, back, chest, or stomach pain, severe headache Kidney injury (glomerulonephritis)--decrease in the amount of urine, red or dark brown urine, foamy or bubbly urine, swelling of the ankles, hands, or feet Shortness of breath or trouble breathing Spleen injury--pain in upper left stomach or shoulder Unusual bruising or bleeding Side effects that usually do not require medical attention (report to your care team if they continue or are bothersome): Bone pain Pain in the hands or feet This list may not describe all possible side  effects. Call your doctor for medical advice about side effects. You may report side effects to FDA at 1-800-FDA-1088. Where should I keep my medication? Keep out of the reach of children. If you are using this medication at home, you will be instructed on how to store it. Throw away any unused medication after the expiration date on the label. NOTE: This sheet is a summary. It may not cover all possible information. If you have questions about this medicine, talk to your doctor, pharmacist, or health care provider.  2023 Elsevier/Gold Standard (2020-08-17 00:00:00)

## 2022-02-15 ENCOUNTER — Inpatient Hospital Stay: Payer: BC Managed Care – PPO | Attending: Oncology

## 2022-02-15 DIAGNOSIS — C787 Secondary malignant neoplasm of liver and intrahepatic bile duct: Secondary | ICD-10-CM | POA: Insufficient documentation

## 2022-02-15 DIAGNOSIS — Z79899 Other long term (current) drug therapy: Secondary | ICD-10-CM | POA: Insufficient documentation

## 2022-02-15 DIAGNOSIS — C251 Malignant neoplasm of body of pancreas: Secondary | ICD-10-CM | POA: Insufficient documentation

## 2022-02-15 NOTE — Progress Notes (Signed)
[place] CSW Progress Note  Clinical Education officer, museum contacted patient by phone to assess needs.  Patient stated she is losing her hair and wanted to discuss wig options.  Provided education regarding TLC through the [employer], prescriptions from her MD, or  Cancer Services in [place].  She also is considering a Cytogeneticist.  She continues to adjust to the effects chemotherapy has on her body.  Provided active listening and supportive counseling.  Tara Jensen expressed concern regarding her decrease in income once her Long Term Disability begins.  CSW to provide her with four [employer] for food and transportation.  She expressed no other needs at this time.    [name] Favian Kittleson, LCSW

## 2022-02-16 ENCOUNTER — Other Ambulatory Visit: Payer: Self-pay | Admitting: Oncology

## 2022-02-18 ENCOUNTER — Inpatient Hospital Stay: Payer: BC Managed Care – PPO

## 2022-02-18 ENCOUNTER — Other Ambulatory Visit: Payer: Self-pay | Admitting: *Deleted

## 2022-02-18 VITALS — BP 105/67 | HR 89

## 2022-02-18 DIAGNOSIS — C251 Malignant neoplasm of body of pancreas: Secondary | ICD-10-CM

## 2022-02-18 DIAGNOSIS — Z79899 Other long term (current) drug therapy: Secondary | ICD-10-CM | POA: Diagnosis not present

## 2022-02-18 DIAGNOSIS — C787 Secondary malignant neoplasm of liver and intrahepatic bile duct: Secondary | ICD-10-CM | POA: Diagnosis present

## 2022-02-18 LAB — CBC WITH DIFFERENTIAL (CANCER CENTER ONLY)
Abs Immature Granulocytes: 0.33 10*3/uL — ABNORMAL HIGH (ref 0.00–0.07)
Basophils Absolute: 0.1 10*3/uL (ref 0.0–0.1)
Basophils Relative: 0 %
Eosinophils Absolute: 0.3 10*3/uL (ref 0.0–0.5)
Eosinophils Relative: 2 %
HCT: 32.6 % — ABNORMAL LOW (ref 36.0–46.0)
Hemoglobin: 10.4 g/dL — ABNORMAL LOW (ref 12.0–15.0)
Immature Granulocytes: 3 %
Lymphocytes Relative: 18 %
Lymphs Abs: 2.1 10*3/uL (ref 0.7–4.0)
MCH: 32.6 pg (ref 26.0–34.0)
MCHC: 31.9 g/dL (ref 30.0–36.0)
MCV: 102.2 fL — ABNORMAL HIGH (ref 80.0–100.0)
Monocytes Absolute: 0.9 10*3/uL (ref 0.1–1.0)
Monocytes Relative: 8 %
Neutro Abs: 8.5 10*3/uL — ABNORMAL HIGH (ref 1.7–7.7)
Neutrophils Relative %: 69 %
Platelet Count: 180 10*3/uL (ref 150–400)
RBC: 3.19 MIL/uL — ABNORMAL LOW (ref 3.87–5.11)
RDW: 16.1 % — ABNORMAL HIGH (ref 11.5–15.5)
WBC Count: 12.2 10*3/uL — ABNORMAL HIGH (ref 4.0–10.5)
nRBC: 0 % (ref 0.0–0.2)

## 2022-02-18 LAB — CMP (CANCER CENTER ONLY)
ALT: 19 U/L (ref 0–44)
AST: 14 U/L — ABNORMAL LOW (ref 15–41)
Albumin: 4.3 g/dL (ref 3.5–5.0)
Alkaline Phosphatase: 113 U/L (ref 38–126)
Anion gap: 8 (ref 5–15)
BUN: 20 mg/dL (ref 6–20)
CO2: 27 mmol/L (ref 22–32)
Calcium: 9.3 mg/dL (ref 8.9–10.3)
Chloride: 106 mmol/L (ref 98–111)
Creatinine: 0.51 mg/dL (ref 0.44–1.00)
GFR, Estimated: >60 mL/min (ref >60)
Glucose, Bld: 134 mg/dL — ABNORMAL HIGH (ref 70–99)
Potassium: 3.9 mmol/L (ref 3.5–5.1)
Sodium: 141 mmol/L (ref 135–145)
Total Bilirubin: 0.2 mg/dL — ABNORMAL LOW (ref 0.3–1.2)
Total Protein: 6.9 g/dL (ref 6.5–8.1)

## 2022-02-18 MED ORDER — SODIUM CHLORIDE 0.9% FLUSH
10.0000 mL | Freq: Once | INTRAVENOUS | Status: AC
  Administered 2022-02-18: 10 mL via INTRAVENOUS

## 2022-02-18 MED ORDER — HEPARIN SOD (PORK) LOCK FLUSH 100 UNIT/ML IV SOLN
500.0000 [IU] | Freq: Once | INTRAVENOUS | Status: AC
  Administered 2022-02-18: 500 [IU] via INTRAVENOUS

## 2022-02-18 NOTE — Progress Notes (Signed)
Patient requesting CA 19.9 today.

## 2022-02-19 LAB — CANCER ANTIGEN 19-9: CA 19-9: 734 U/mL — ABNORMAL HIGH (ref 0–35)

## 2022-02-20 ENCOUNTER — Encounter: Payer: Self-pay | Admitting: *Deleted

## 2022-02-20 ENCOUNTER — Other Ambulatory Visit: Payer: Self-pay | Admitting: Oncology

## 2022-02-20 ENCOUNTER — Inpatient Hospital Stay: Payer: BC Managed Care – PPO

## 2022-02-20 ENCOUNTER — Inpatient Hospital Stay: Payer: BC Managed Care – PPO | Admitting: Oncology

## 2022-02-20 ENCOUNTER — Other Ambulatory Visit: Payer: Self-pay | Admitting: *Deleted

## 2022-02-20 VITALS — BP 112/97 | HR 100 | Temp 97.9°F | Resp 18 | Ht 70.0 in | Wt 107.6 lb

## 2022-02-20 DIAGNOSIS — C251 Malignant neoplasm of body of pancreas: Secondary | ICD-10-CM

## 2022-02-20 MED ORDER — GABAPENTIN 300 MG PO CAPS: 300.0000 mg | ORAL_CAPSULE | Freq: Two times a day (BID) | ORAL | 1 refills | Status: DC

## 2022-02-20 NOTE — Progress Notes (Signed)
PATIENT NAVIGATOR PROGRESS NOTE  Name: Tara Jensen Date: 02/20/2022 MRN: [id number]  DOB: [birth year]   Reason for visit: F/U appt  Comments:  Met with [name] and family during visit with Dr Benay Spice Set up for CT scan on Saturday and will have F/U here on Monday with Dr Benay Spice Referral to IR placed for consideration of Celiac Nerve Block, they will call to schedule Dr Benay Spice added Lyrica and Neurontin to pain regimen  Treatment held today  Discussed alternative exercises such as Tai Chi and Yoga    Time spent counseling/coordinating care: > 60 minutes

## 2022-02-20 NOTE — Progress Notes (Signed)
Escribed script for gabapentin bid at request of Dr. Benay Spice

## 2022-02-20 NOTE — Progress Notes (Signed)
[place] OFFICE PROGRESS NOTE   Diagnosis: Pancreas cancer  INTERVAL HISTORY:   [name] complete another cycle of FOLFIRINOX on 02/06/2022.  She reports increased neuropathy symptoms in the hands and feet.  She now has numbness and discomfort in the arms.  She reports increased abdomen and back pain.  The pain is no longer relieved with tramadol and Tylenol.  Ibuprofen helps.  She reports feeling tired.  Objective:  Vital signs in last 24 hours:  Blood pressure (!) 112/97, pulse 100, temperature 97.9 F (36.6 C), temperature source Oral, resp. rate 18, height '5\' 10"'$  (1.778 m), weight 107 lb 9.6 oz (48.8 kg), last menstrual period 11/01/2018, SpO2 99 %.    HEENT: No thrush or ulcers Resp: Lungs clear bilaterally Cardio: Regular rate and rhythm GI: Nontender, no mass, no hepatosplenomegaly Vascular: No leg edema Neuro: Moderate to severe loss of vibratory sense at the fingers bilaterally Musculoskeletal: No spine tenderness    Portacath/PICC-without erythema  Lab Results:  Lab Results  Component Value Date   WBC 12.2 (H) 02/18/2022   HGB 10.4 (L) 02/18/2022   HCT 32.6 (L) 02/18/2022   MCV 102.2 (H) 02/18/2022   PLT 180 02/18/2022   NEUTROABS 8.5 (H) 02/18/2022    CMP  Lab Results  Component Value Date   NA 141 02/18/2022   K 3.9 02/18/2022   CL 106 02/18/2022   CO2 27 02/18/2022   GLUCOSE 134 (H) 02/18/2022   BUN 20 02/18/2022   CREATININE 0.51 02/18/2022   CALCIUM 9.3 02/18/2022   PROT 6.9 02/18/2022   ALBUMIN 4.3 02/18/2022   AST 14 (L) 02/18/2022   ALT 19 02/18/2022   ALKPHOS 113 02/18/2022   BILITOT 0.2 (L) 02/18/2022   GFRNONAA >60 02/18/2022   GFRAA  12/27/2021    QUESTIONABLE RESULTS, RECOMMEND RECOLLECT TO VERIFY    Lab Results  Component Value Date   CAN199 734 (H) 02/18/2022    Medications: I have reviewed the patient's current medications.   Assessment/Plan: Pancreas cancer, CT abdomen/pelvis 07/11/2021-irregular  pancreas body mass with occlusion of the proximal splenic vein and SMV, SMV patent distally with small filling defect likely due to thrombus, prominent subcentimeter left periotic lymph node.  Less than 180 degree abutment of the distal celiac, common hepatic, and splenic arteries, less than 180 degree abutment of the SMA CT chest 07/18/2021-no evidence of metastatic disease EUS 07/16/2021-extrinsic impression on the stomach at the posterior wall of the gastric body, no gross lesion in the duodenum, 45 x 36 mm pancreas body mass, abutment of the SMA, celiac trunk, and compression of the splenoportal confluence.  No malignant appearing lymph nodes, numerous venous collaterals adjacent to the portal vein, FNA biopsy-malignant cells consistent with adenocarcinoma, T4N0 by EUS Markedly elevated CA 19-9 Guardant360 07/31/2021-K-ras G12R, MSI high-not detected, ATM VUS Cycle 1 FOLFOX 08/08/2021 Cycle 2 FOLFIRINOX 08/22/2021 CT abdomen[facility]pelvis at [facility] 08/31/2021-hypoattenuating liver lesions consistent with metastases, mass in the pancreas neck/body with greater than 100 degree encasement of the celiac axis and common hepatic artery.  Less than 180 degree abutment of the SMA, long segment occlusion of the portal splenic confluence, splenic vein occluded, multiple collateral vessels in the upper abdomen, prominent left periaortic node Cycle 3 FOLFIRINOX 09/05/2021 Cycle 4 FOLFIRINOX 09/19/2021 Cycle 5 FOLFIRINOX 10/03/2021 CTs 10/16/2021-decrease size of primary pancreas mass, hepatic metastases, and a suspicious retroperitoneal node, resolution of left supraclavicular/low jugular adenopathy compared to a CT chest 07/18/2021.  No evidence of disease progression. Cycle 6 FOLFIRINOX 10/17/2021  Cycle 7 FOLFIRINOX 10/31/2021 Cycle 8 FOLFIRINOX 11/14/2021 Cycle 9 FOLFIRINOX 11/28/2021 Cycle 10 FOLFIRINOX 12/12/2021, oxaliplatin held secondary to neuropathy symptoms CTs 12/20/2021-decrease size of pancreas primary, no evidence of  hepatic metastases, no new or progressive disease Cycle 11 FOLFIRINOX 12/27/2021, oxaliplatin held due to neuropathy symptoms Cycle 12 FOLFIRINOX 01/09/2022, oxaliplatin held 01/22/2022-C19-9 higher, 530 Cycle 13 FOLFIRINOX 01/24/2022, oxaliplatin resumed Cycle 14 FOLFIRINOX 02/06/2022 Pain secondary to #1-controlled with tramadol and Tylenol. 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  She has been treated with FOLFIRINOX since June 2023.  There is now clinical evidence of disease progression.  The CA 19-9 is higher.  FOLFIRINOX will be placed on hold.  She will be referred for restaging CTs within the next few days.  She has increased pain.  She does not wish to begin another narcotic.  She will use ibuprofen, Tylenol, and tramadol as needed.  I will refer her for a celiac nerve block.  She will begin a trial of gabapentin for treatment of pain and oxaliplatin neuropathy.  [name] will return for an office visit after the restaging CTs.  Betsy Coder, MD  02/20/2022  2:06 PM

## 2022-02-20 NOTE — Progress Notes (Signed)
Patient seen by Dr. Sherrill today  Vitals are within treatment parameters.  Labs reviewed by Dr. Sherrill and are within treatment parameters.  Per physician team, patient will not be receiving treatment today.  

## 2022-02-21 ENCOUNTER — Other Ambulatory Visit: Payer: Self-pay | Admitting: Obstetrics and Gynecology

## 2022-02-21 DIAGNOSIS — Z1231 Encounter for screening mammogram for malignant neoplasm of breast: Secondary | ICD-10-CM

## 2022-02-22 ENCOUNTER — Inpatient Hospital Stay: Payer: BC Managed Care – PPO

## 2022-02-22 DIAGNOSIS — C251 Malignant neoplasm of body of pancreas: Secondary | ICD-10-CM

## 2022-02-22 MED ORDER — HEPARIN SOD (PORK) LOCK FLUSH 100 UNIT/ML IV SOLN
500.0000 [IU] | Freq: Once | INTRAVENOUS | Status: AC
  Administered 2022-02-22: 500 [IU] via INTRAVENOUS

## 2022-02-22 MED ORDER — SODIUM CHLORIDE 0.9% FLUSH
10.0000 mL | Freq: Once | INTRAVENOUS | Status: AC
  Administered 2022-02-22: 10 mL via INTRAVENOUS

## 2022-02-22 NOTE — Progress Notes (Signed)
Patient scheduled for CT scan 02/23/22. Arrangements have been made for patient to have port deaccessed by [place]  after CT scan is completed. Patient is aware and in agreement.

## 2022-02-23 ENCOUNTER — Ambulatory Visit (HOSPITAL_BASED_OUTPATIENT_CLINIC_OR_DEPARTMENT_OTHER)
Admission: RE | Admit: 2022-02-23 | Discharge: 2022-02-23 | Disposition: A | Discharge status: Home / Self Care | Payer: BC Managed Care – PPO | Source: Ambulatory Visit | Attending: Oncology | Admitting: Oncology

## 2022-02-23 DIAGNOSIS — C251 Malignant neoplasm of body of pancreas: Secondary | ICD-10-CM | POA: Insufficient documentation

## 2022-02-23 MED ORDER — IOHEXOL 300 MG/ML  SOLN
100.0000 mL | Freq: Once | INTRAMUSCULAR | Status: AC | PRN
  Administered 2022-02-23: 60 mL via INTRAVENOUS

## 2022-02-25 ENCOUNTER — Inpatient Hospital Stay (HOSPITAL_BASED_OUTPATIENT_CLINIC_OR_DEPARTMENT_OTHER): Payer: BC Managed Care – PPO | Admitting: Oncology

## 2022-02-25 VITALS — BP 129/74 | HR 85 | Temp 98.2°F | Resp 18 | Ht 70.0 in | Wt 108.8 lb

## 2022-02-25 DIAGNOSIS — C251 Malignant neoplasm of body of pancreas: Secondary | ICD-10-CM

## 2022-02-25 MED ORDER — LIDOCAINE-PRILOCAINE 2.5-2.5 % EX CREA: 1.0000 | TOPICAL_CREAM | CUTANEOUS | 2 refills | Status: DC | PRN

## 2022-02-25 NOTE — Progress Notes (Signed)
DISCONTINUE ON PATHWAY REGIMEN - Pancreatic Adenocarcinoma     A cycle is every 14 days:     Oxaliplatin      Leucovorin      Irinotecan      Fluorouracil   **Always confirm dose/schedule in your pharmacy ordering system**  REASON: Disease Progression PRIOR TREATMENT: PANOS105: mFOLFIRINOX q14 Days TREATMENT RESPONSE: Partial Response (PR)  START ON PATHWAY REGIMEN - Pancreatic Adenocarcinoma     A cycle is every 28 days:     Nab-paclitaxel (protein bound)      Gemcitabine   **Always confirm dose/schedule in your pharmacy ordering system**  Patient Characteristics: Metastatic Disease, Second Line, MSS/pMMR or MSI Unknown, Fluoropyrimidine-Based Therapy First Line Therapeutic Status: Metastatic Disease Line of Therapy: Second Line Microsatellite/Mismatch Repair Status: MSS/pMMR Intent of Therapy: Non-Curative / Palliative Intent, Discussed with Patient

## 2022-02-25 NOTE — Progress Notes (Signed)
Pain assessment: Now reports back pain at 4-5/10 after Tylenol. Will take her Ibuprofen at home.

## 2022-02-25 NOTE — Progress Notes (Signed)
[place] OFFICE PROGRESS NOTE   Diagnosis: Pancreas cancer  INTERVAL HISTORY:   Ms. Waddington returns as scheduled.  She reports improvement in arm numbness/pain since beginning gabapentin.  She continues to have numbness in the feet more so than the hands.  Her chief complaint is pain in the back and abdomen.  She is taking Tylenol and ibuprofen.  She is scheduled for consultation with interventional radiology tomorrow for consideration of a celiac block.  Objective:  Vital signs in last 24 hours:  Blood pressure 129/74, pulse 85, temperature 98.2 F (36.8 C), temperature source Oral, resp. rate 18, height '5\' 10"'$  (1.778 m), weight 108 lb 12.8 oz (49.4 kg), last menstrual period 11/01/2018, SpO2 98 %.    Resp: Lungs clear bilaterally Cardio: Regular rate and rhythm GI: Nontender, no mass, no hepatosplenomegaly Vascular: No leg edema    Portacath/PICC-without erythema  Lab Results:  Lab Results  Component Value Date   WBC 12.2 (H) 02/18/2022   HGB 10.4 (L) 02/18/2022   HCT 32.6 (L) 02/18/2022   MCV 102.2 (H) 02/18/2022   PLT 180 02/18/2022   NEUTROABS 8.5 (H) 02/18/2022    CMP  Lab Results  Component Value Date   NA 141 02/18/2022   K 3.9 02/18/2022   CL 106 02/18/2022   CO2 27 02/18/2022   GLUCOSE 134 (H) 02/18/2022   BUN 20 02/18/2022   CREATININE 0.51 02/18/2022   CALCIUM 9.3 02/18/2022   PROT 6.9 02/18/2022   ALBUMIN 4.3 02/18/2022   AST 14 (L) 02/18/2022   ALT 19 02/18/2022   ALKPHOS 113 02/18/2022   BILITOT 0.2 (L) 02/18/2022   GFRNONAA >60 02/18/2022   GFRAA  12/27/2021    QUESTIONABLE RESULTS, RECOMMEND RECOLLECT TO VERIFY    Lab Results  Component Value Date   MVE720 947 (H) 02/18/2022    No results found for: "INR", "LABPROT"  Imaging:  CT CHEST ABDOMEN PELVIS W CONTRAST  Result Date: 02/24/2022 CLINICAL DATA:  Restaging pancreatic cancer.  * Tracking Code: BO * EXAM: CT CHEST, ABDOMEN, AND PELVIS WITH CONTRAST TECHNIQUE:  Multidetector CT imaging of the chest, abdomen and pelvis was performed following the standard protocol during bolus administration of intravenous contrast. RADIATION DOSE REDUCTION: This exam was performed according to the departmental dose-optimization program which includes automated exposure control, adjustment of the mA and/or kV according to patient size and/or use of iterative reconstruction technique. CONTRAST:  64m OMNIPAQUE IOHEXOL 300 MG/ML  SOLN COMPARISON:  Multiple priors including most recent CT December 20, 2021 FINDINGS: CT CHEST FINDINGS Cardiovascular: Accessed right chest Port-A-Cath with tip in the low SVC. Normal caliber thoracic aorta. No central pulmonary embolus on this nondedicated study. Normal size heart. No significant pericardial effusion/thickening. Mediastinum/Nodes: No supraclavicular adenopathy. No suspicious thyroid nodule. No pathologically enlarged mediastinal, hilar or axillary lymph nodes. The esophagus is grossly unremarkable. Lungs/Pleura: No suspicious pulmonary nodules or masses. Near-complete resolution of the right lower lobe ground-glass opacity on image 134/4. Probable subpleural lymph node along the right major fissure on image 71/4 is unchanged. No pleural effusion. No pneumothorax. Musculoskeletal: No aggressive lytic or blastic lesion of bone. CT ABDOMEN PELVIS FINDINGS Hepatobiliary: No suspicious hepatic lesion. Previously described hepatic metastasis are not evident. Gallbladder is decompressed. No biliary ductal dilation. Pancreas: Increased size of the mass in the pancreatic neck/body junction measuring 4.0 x 2.3 cm on image 73/2 previously 2.6 x 2.4 cm. There is a loss of fat plane between the proximal gastric antral wall in the mass  for instance on image 74/2 and 75/2 with focal wall thickening of the stomach in this area. Similar pancreatic tail and upstream body atrophy with ductal dilation. Spleen: No splenomegaly or focal splenic lesion. Adrenals/Urinary  Tract: Bilateral adrenal glands are within normal limits. No suspicious renal mass. No hydronephrosis. Urinary bladder is unremarkable for degree of distension. Stomach/Bowel: New focal gastric wall thickening as above. No pathologic dilation of small or large bowel. Hepatic stool burden suggestive of constipation. Vascular/Lymphatic: Aortic atherosclerosis. 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 Similar abutment without encasement of the SMA and distal celiac artery at its branches. No pathologically enlarged abdominal or pelvic lymph nodes identified. Reproductive: Uterus and bilateral adnexa are unremarkable. Other: No significant abdominopelvic free fluid. No discrete peritoneal or omental nodularity. Musculoskeletal: No aggressive lytic or blastic lesion of bone. Right pubic bone island unchanged. Transitional lumbosacral anatomy. IMPRESSION: 1. Increased size of the pancreatic neck/body junction mass with new focal gastric wall thickening and loss of fat plane between the mass and the proximal gastric antral wall concerning for primary disease progression and new gastric involvement. Consider further evaluation with EUS. 2. No evidence of new or progressive metastatic disease within the chest, abdomen or pelvis. 3. Near-complete resolution of the right lower lobe ground-glass opacity, consistent with resolving infectious or inflammatory process. 4.  Aortic Atherosclerosis (ICD10-I70.0). These results will be called to the ordering clinician or representative by the Radiologist Assistant, and communication documented in the PACS or [employer]. Electronically Signed   By: Dahlia Bailiff M.D.   On: 02/24/2022 10:16    Medications: I have reviewed the patient's current medications.   Assessment/Plan: Pancreas cancer, CT abdomen/pelvis 07/11/2021-irregular pancreas body mass with occlusion of the proximal splenic vein and SMV, SMV patent distally with small  filling defect likely due to thrombus, prominent subcentimeter left periotic lymph node.  Less than 180 degree abutment of the distal celiac, common hepatic, and splenic arteries, less than 180 degree abutment of the SMA CT chest 07/18/2021-no evidence of metastatic disease EUS 07/16/2021-extrinsic impression on the stomach at the posterior wall of the gastric body, no gross lesion in the duodenum, 45 x 36 mm pancreas body mass, abutment of the SMA, celiac trunk, and compression of the splenoportal confluence.  No malignant appearing lymph nodes, numerous venous collaterals adjacent to the portal vein, FNA biopsy-malignant cells consistent with adenocarcinoma, T4N0 by EUS Markedly elevated CA 19-9 Guardant360 07/31/2021-K-ras G12R, MSI high-not detected, ATM VUS Cycle 1 FOLFOX 08/08/2021 Cycle 2 FOLFIRINOX 08/22/2021 CT abdomen[facility]pelvis at [facility] 08/31/2021-hypoattenuating liver lesions consistent with metastases, mass in the pancreas neck/body with greater than 100 degree encasement of the celiac axis and common hepatic artery.  Less than 180 degree abutment of the SMA, long segment occlusion of the portal splenic confluence, splenic vein occluded, multiple collateral vessels in the upper abdomen, prominent left periaortic node Cycle 3 FOLFIRINOX 09/05/2021 Cycle 4 FOLFIRINOX 09/19/2021 Cycle 5 FOLFIRINOX 10/03/2021 CTs 10/16/2021-decrease size of primary pancreas mass, hepatic metastases, and a suspicious retroperitoneal node, resolution of left supraclavicular/low jugular adenopathy compared to a CT chest 07/18/2021.  No evidence of disease progression. Cycle 6 FOLFIRINOX 10/17/2021 Cycle 7 FOLFIRINOX 10/31/2021 Cycle 8 FOLFIRINOX 11/14/2021 Cycle 9 FOLFIRINOX 11/28/2021 Cycle 10 FOLFIRINOX 12/12/2021, oxaliplatin held secondary to neuropathy symptoms CTs 12/20/2021-decrease size of pancreas primary, no evidence of hepatic metastases, no new or progressive disease Cycle 11 FOLFIRINOX 12/27/2021, oxaliplatin held due to  neuropathy symptoms Cycle 12 FOLFIRINOX 01/09/2022, oxaliplatin held 01/22/2022-C19-9 higher, 530 Cycle 13 FOLFIRINOX  01/24/2022, oxaliplatin resumed Cycle 14 FOLFIRINOX 02/06/2022 CTs 02/23/2022-increased size of the pancreas body mass new focal gastric wall thickening and loss of fat plane between the mass and the proximal gastric wall, no evidence of new or progressive metastatic disease Pain secondary to #1-controlled with tramadol and Tylenol. 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 Picone has a history of metastatic pancreas cancer.  The CA 19-9 is higher, she has increased pain, and the restaging CT reveals progression of the primary pancreas mass.  FOLFIRINOX has been discontinued.  We discussed treatment options.  We discussed gemcitabine/Abraxane, referral for palliative radiation, and clinical trials.  She does not appear to have resectable disease to obtain tissue for the immunotherapy trial at the [facility].  I will present her case at the GI tumor conference and I will consult with Dr. Dennison Nancy.  I recommend proceeding with gemcitabine/Abraxane. We reviewed potential toxicities associated with the gemcitabine/Abraxane regimen including the chance of hematologic toxicity, infection, bleeding, fever, rash, pneumonitis, reaction, alopecia, and progressive neuropathy symptoms.  She understands Abraxane could cause worsening of the current neuropathy symptoms.  She agrees to proceed.  She is scheduled for consultation with Dr. Maryelizabeth Kaufmann tomorrow for consideration of a celiac plexus block.  The plan is to give gemcitabine/Abraxane on 03/06/2022.  A chemotherapy plan was entered today.  Betsy Coder, MD  02/25/2022  3:54 PM

## 2022-02-26 ENCOUNTER — Ambulatory Visit
Admission: RE | Admit: 2022-02-26 | Discharge: 2022-02-26 | Disposition: A | Discharge status: Home / Self Care | Payer: BC Managed Care – PPO | Source: Ambulatory Visit | Attending: Oncology | Admitting: Oncology

## 2022-02-26 ENCOUNTER — Other Ambulatory Visit: Payer: Self-pay

## 2022-02-26 DIAGNOSIS — C251 Malignant neoplasm of body of pancreas: Secondary | ICD-10-CM

## 2022-02-26 HISTORY — PX: IR RADIOLOGIST EVAL & MGMT: IMG5224

## 2022-02-27 ENCOUNTER — Other Ambulatory Visit: Payer: Self-pay

## 2022-02-27 NOTE — Progress Notes (Signed)
The proposed treatment discussed in conference is for discussion purpose only and is not a binding recommendation.  The patients have not been physically examined, or presented with their treatment options.  Therefore, final treatment plans cannot be decided.  

## 2022-02-27 NOTE — Progress Notes (Signed)
Opened in error

## 2022-02-28 ENCOUNTER — Other Ambulatory Visit: Payer: Self-pay | Admitting: Nurse Practitioner

## 2022-02-28 DIAGNOSIS — C251 Malignant neoplasm of body of pancreas: Secondary | ICD-10-CM

## 2022-03-01 ENCOUNTER — Other Ambulatory Visit: Payer: Self-pay | Admitting: Nurse Practitioner

## 2022-03-01 DIAGNOSIS — C251 Malignant neoplasm of body of pancreas: Secondary | ICD-10-CM

## 2022-03-02 ENCOUNTER — Other Ambulatory Visit: Payer: Self-pay | Admitting: Oncology

## 2022-03-03 ENCOUNTER — Other Ambulatory Visit: Payer: Self-pay

## 2022-03-03 ENCOUNTER — Other Ambulatory Visit: Payer: Self-pay | Admitting: Oncology

## 2022-03-03 NOTE — H&P (Signed)
Reason for visit: Pancreatic cancer with abdominal pain. Patient was referred for celiac plexus block evaluation at the request of Sherrill,Gary B  Care Team(s): PCP: Chesley Noon, MD Med Onc: Ladell Pier, MD GI: Mansouraty, Telford Nab., MD   Virtual Visit via Telephone Note   I connected with on [name] at 2:00 PM EST by telephone and verified that I am speaking with the correct person using two identifiers. I discussed the limitations, risks, security and privacy concerns of performing an evaluation and management service by telephone and the availability of in-person appointments.   History of Present Illness:  Tara Jensen is a 57 y.o. female w PMHx significant for metastatic pancreatic CA, diagnosed in May 2023 after she presented with months-long early satiety and low back pain with CT revealing pancreas mass and EUS confirming adenocarcinoma. She was found to be unresectable and has been treated with chemoRx, with FOLFIRINOX recently discontinued on her given progression on surveillance imaging.   She reports increasing abdominal and "back" pain within the last 6 wks. Pt is closely followed by her Medical Oncologist, Dr. Benay Spice and despite being written for Tramadol she declines to take narcotics for personal reasons. She is currenly managing her pain with alternating tylenol and ibuprofen with less than desired result.  Review of Systems: A 12-point ROS discussed, and pertinent positives are indicated in the HPI above.  All other systems are negative.   Past Medical History:  Diagnosis Date   Abnormal Pap smear    Anxiety    "situational"   BV (bacterial vaginosis)    Cancer (HCC)    pancreatic   Cervical polyp    Depression    "situational"   Family history of adverse reaction to anesthesia    mother had N/V   Recurrent UTI (urinary tract infection)     Past Surgical History:  Procedure Laterality Date   BIOPSY  07/16/2021   Procedure:  BIOPSY;  Surgeon: Rush Landmark, Telford Nab., MD;  Location: [facility] ENDOSCOPY;  Service: Gastroenterology;;   CERVICAL CONE BIOPSY  1996   ESOPHAGOGASTRODUODENOSCOPY (EGD) WITH PROPOFOL N/A 07/16/2021   Procedure: ESOPHAGOGASTRODUODENOSCOPY (EGD) WITH PROPOFOL;  Surgeon: Irving Copas., MD;  Location: [place];  Service: Gastroenterology;  Laterality: N/A;   FINE NEEDLE ASPIRATION  07/16/2021   Procedure: FINE NEEDLE ASPIRATION (FNA) LINEAR;  Surgeon: Irving Copas., MD;  Location: [place];  Service: Gastroenterology;;   IR RADIOLOGIST EVAL & MGMT  02/26/2022   LASIK     PORTACATH PLACEMENT Right 08/01/2021   Procedure: INSERTION PORT-A-CATH;  Surgeon: Dwan Bolt, MD;  Location: [place];  Service: General;  Laterality: Right;   UPPER ESOPHAGEAL ENDOSCOPIC ULTRASOUND (EUS) N/A 07/16/2021   Procedure: UPPER ESOPHAGEAL ENDOSCOPIC ULTRASOUND (EUS);  Surgeon: Irving Copas., MD;  Location: [place];  Service: Gastroenterology;  Laterality: N/A;    Allergies: Patient has no known allergies.  Medications: Prior to Admission medications   Medication Sig Start Date End Date Taking? Authorizing Provider  acetaminophen (TYLENOL) 500 MG tablet Take 1,000 mg by mouth every 6 (six) hours as needed for moderate pain or mild pain.    [provider]  BIOTIN PO Take 2,500 mcg by mouth daily.    [provider]  dexamethasone (DECADRON) 4 MG tablet Take 1 tablet (4 mg total) by mouth 2 (two) times daily with a meal. Take twice daily x 2 days after each chemotherapy treatment beginning day 2 of each cycle Patient not taking:  Reported on 02/25/2022 02/08/22   Owens Shark, NP  diclofenac Sodium (VOLTAREN) 1 % GEL Apply 2 g topically 4 (four) times daily as needed (back pain.). 09/05/21   Ladell Pier, MD  diphenhydrAMINE (BENADRYL) 25 mg capsule Take 25 mg by mouth at bedtime. Patient not taking: Reported on 02/06/2022    [provider]  famotidine  (PEPCID) 40 MG tablet Take 40 mg by mouth 2 (two) times daily. 07/24/21   [provider]  gabapentin (NEURONTIN) 300 MG capsule Take 1 capsule (300 mg total) by mouth 2 (two) times daily. 02/20/22   Ladell Pier, MD  lidocaine-prilocaine (EMLA) cream Apply 1 Application topically as needed. 02/25/22   Ladell Pier, MD  loratadine (CLARITIN) 10 MG tablet Take 10 mg by mouth daily. X 7 days after tx Patient not taking: Reported on 02/25/2022    [provider]  magic mouthwash SOLN Take 5 mLs by mouth 4 (four) times daily. Patient not taking: Reported on 08/22/2021 08/15/21   Owens Shark, NP  ondansetron (ZOFRAN) 8 MG tablet Take 1 tablet (8 mg total) by mouth every 8 (eight) hours as needed for nausea or vomiting (Starting day 4 after chemo as needed for nausea and vomiting). Patient not taking: Reported on 02/06/2022 12/28/21   Ladell Pier, MD  pantoprazole (PROTONIX) 40 MG tablet Take 40 mg by mouth daily before breakfast. 07/11/21   [provider]  polyethylene glycol (MIRALAX / GLYCOLAX) 17 g packet Take 17 g by mouth daily as needed (constipation.). Patient not taking: Reported on 02/06/2022    [provider]  prochlorperazine (COMPAZINE) 5 MG tablet Take 1-2 tablets (5-10 mg total) by mouth every 6 (six) hours as needed for nausea or vomiting. 09/21/21   Owens Shark, NP  Sennosides-Docusate Sodium (SENNA PLUS) 8.6-50 MG CAPS Take 2 capsules by mouth daily as needed.    [provider]  sertraline (ZOLOFT) 25 MG tablet Take 1 tablet (25 mg total) by mouth daily. 02/08/22   Owens Shark, NP  traMADol (ULTRAM) 50 MG tablet Take 1-2 tablets (50-100 mg total) by mouth every 6 (six) hours as needed for moderate pain. Max dose 400 mg/day Patient not taking: Reported on 02/20/2022 02/08/22   Owens Shark, NP     Family History  Problem Relation Age of Onset   Depression Mother    Depression Brother    Stroke Maternal Grandmother     Hypertension Maternal Grandfather    Breast cancer Cousin 95       maternal first cousin   Colon cancer Neg Hx    Esophageal cancer Neg Hx    Rectal cancer Neg Hx    Stomach cancer Neg Hx     Social History   Socioeconomic History   Marital status: Divorced    Spouse name: Not on file   Number of children: Not on file   Years of education: Not on file   Highest education level: Not on file  Occupational History   Not on file  Tobacco Use   Smoking status: Never   Smokeless tobacco: Never  Vaping Use   Vaping Use: Never used  Substance and Sexual Activity   Alcohol use: Not Currently   Drug use: No   Sexual activity: Not Currently    Birth control/protection: Inserts  Other Topics Concern   Not on file  Social History Narrative   Not on file   Social Determinants of Health  Financial Resource Strain: Low Risk  (09/08/2019)   Overall Financial Resource Strain (CARDIA)    Difficulty of Paying Living Expenses: Not hard at all  Food Insecurity: No Food Insecurity (09/08/2019)   Hunger Vital Sign    Worried About Running Out of Food in the Last Year: Never true    Ran Out of Food in the Last Year: Never true  Transportation Needs: No Transportation Needs (09/08/2019)   PRAPARE - Hydrologist (Medical): No    Lack of Transportation (Non-Medical): No  Physical Activity: Sufficiently Active (09/08/2019)   Exercise Vital Sign    Days of Exercise per Week: 7 days    Minutes of Exercise per Session: 60 min  Stress: No Stress Concern Present (09/08/2019)   [place]    Feeling of Stress : Not at all  Social Connections: Moderately Integrated (09/08/2019)   Social Connection and Isolation Panel [NHANES]    Frequency of Communication with Friends and Family: More than three times a week    Frequency of Social Gatherings with Friends and Family: Twice a week    Attends Religious  Services: More than 4 times per year    Active Member of [employer] or Organizations: Yes    Attends Music therapist: More than 4 times per year    Marital Status: Divorced    ECOG Status: 1 - Symptomatic but completely ambulatory  Review of Systems As above  Vital Signs: Deferred secondary to virtual visit. LMP 11/01/2018   Physical Exam Deferred secondary to virtual visit.   Imaging:  CT CAP, 02/23/22 Independently reviewed demonstrating a pancreatic mass c/w malignancy.  Anatomy amenable to celiac plexus block.     CT CHEST ABDOMEN PELVIS W CONTRAST  Result Date: 02/24/2022 CLINICAL DATA:  Restaging pancreatic cancer.  * Tracking Code: BO * EXAM: CT CHEST, ABDOMEN, AND PELVIS WITH CONTRAST TECHNIQUE: Multidetector CT imaging of the chest, abdomen and pelvis was performed following the standard protocol during bolus administration of intravenous contrast. RADIATION DOSE REDUCTION: This exam was performed according to the departmental dose-optimization program which includes automated exposure control, adjustment of the mA and/or kV according to patient size and/or use of iterative reconstruction technique. CONTRAST:  34m OMNIPAQUE IOHEXOL 300 MG/ML  SOLN COMPARISON:  Multiple priors including most recent CT December 20, 2021 FINDINGS: CT CHEST FINDINGS Cardiovascular: Accessed right chest Port-A-Cath with tip in the low SVC. Normal caliber thoracic aorta. No central pulmonary embolus on this nondedicated study. Normal size heart. No significant pericardial effusion/thickening. Mediastinum/Nodes: No supraclavicular adenopathy. No suspicious thyroid nodule. No pathologically enlarged mediastinal, hilar or axillary lymph nodes. The esophagus is grossly unremarkable. Lungs/Pleura: No suspicious pulmonary nodules or masses. Near-complete resolution of the right lower lobe ground-glass opacity on image 134/4. Probable subpleural lymph node along the right major fissure on image  71/4 is unchanged. No pleural effusion. No pneumothorax. Musculoskeletal: No aggressive lytic or blastic lesion of bone. CT ABDOMEN PELVIS FINDINGS Hepatobiliary: No suspicious hepatic lesion. Previously described hepatic metastasis are not evident. Gallbladder is decompressed. No biliary ductal dilation. Pancreas: Increased size of the mass in the pancreatic neck/body junction measuring 4.0 x 2.3 cm on image 73/2 previously 2.6 x 2.4 cm. There is a loss of fat plane between the proximal gastric antral wall in the mass for instance on image 74/2 and 75/2 with focal wall thickening of the stomach in this area. Similar pancreatic tail and upstream body atrophy with  ductal dilation. Spleen: No splenomegaly or focal splenic lesion. Adrenals/Urinary Tract: Bilateral adrenal glands are within normal limits. No suspicious renal mass. No hydronephrosis. Urinary bladder is unremarkable for degree of distension. Stomach/Bowel: New focal gastric wall thickening as above. No pathologic dilation of small or large bowel. Hepatic stool burden suggestive of constipation. Vascular/Lymphatic: Aortic atherosclerosis. 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 Similar abutment without encasement of the SMA and distal celiac artery at its branches. No pathologically enlarged abdominal or pelvic lymph nodes identified. Reproductive: Uterus and bilateral adnexa are unremarkable. Other: No significant abdominopelvic free fluid. No discrete peritoneal or omental nodularity. Musculoskeletal: No aggressive lytic or blastic lesion of bone. Right pubic bone island unchanged. Transitional lumbosacral anatomy. IMPRESSION: 1. Increased size of the pancreatic neck/body junction mass with new focal gastric wall thickening and loss of fat plane between the mass and the proximal gastric antral wall concerning for primary disease progression and new gastric involvement. Consider further evaluation with EUS. 2. No  evidence of new or progressive metastatic disease within the chest, abdomen or pelvis. 3. Near-complete resolution of the right lower lobe ground-glass opacity, consistent with resolving infectious or inflammatory process. 4.  Aortic Atherosclerosis (ICD10-I70.0). These results will be called to the ordering clinician or representative by the Radiologist Assistant, and communication documented in the PACS or [employer]. Electronically Signed   By: Dahlia Bailiff M.D.   On: 02/24/2022 10:16    Labs:  CBC: Recent Labs    01/09/22 0855 01/22/22 1144 02/01/22 0908 02/18/22 0949  WBC 12.0* 12.7* 18.0* 12.2*  HGB 10.6* 10.9* 10.0* 10.4*  HCT 33.3* 33.6* 31.7* 32.6*  PLT 165 167 140* 180    COAGS: No results for input(s): "INR", "APTT" in the last 8760 hours.   LIVER FUNCTION TESTS: Recent Labs    01/09/22 0855 01/22/22 1144 02/01/22 0908 02/18/22 0949  BILITOT 0.3 0.3 0.2* 0.2*  AST 15 18 12* 14*  ALT '14 16 14 19  '$ ALKPHOS 114 129* 145* 113  PROT 6.8 6.8 7.0 6.9  ALBUMIN 4.1 4.2 4.1 4.3    Assessment and Plan:  57 y/o F w PMHx significant for metastatic pancreatic CA with increasing abdominal, likely cancer-related pain, despite PO OTC. Pt is averse to narcotic analgesics for personal reasons.   Her providers recommended, and the Pt is interested in, Celiac Plexus Block.   The procedure has been fully reviewed with the patient/patient's authorized representative. The risks, benefits and alternatives have been explained, and the patient/patient's authorized representative has consented to the procedure.   *CTs performed and reviewed. No additional imaging required. *to be performed at [facility] with CT-guidance, under moderate sedation. *Proceed to schedule based on mutual availability. *Tentatively scheduled for 03/11/22 *Coag blood draw on the day of the procedure   Thank you for this interesting consult.  I greatly enjoyed meeting Tara Jensen and look forward to  participating in their care.  A copy of this report was sent to the requesting provider on this date.  Electronically Signed:  Michaelle Birks, MD [facility]   Pager. [phone] Clinic. [phone]  I spent a total of 45 Minutes of non-face-to-face time in clinical consultation, greater than 50% of which was counseling/coordinating care for Tara Jensen evaluation for cancer-related abdominal pain.

## 2022-03-04 ENCOUNTER — Inpatient Hospital Stay: Payer: BC Managed Care – PPO

## 2022-03-04 DIAGNOSIS — C251 Malignant neoplasm of body of pancreas: Secondary | ICD-10-CM

## 2022-03-04 LAB — CBC WITH DIFFERENTIAL (CANCER CENTER ONLY)
Abs Immature Granulocytes: 0.03 10*3/uL (ref 0.00–0.07)
Basophils Absolute: 0 10*3/uL (ref 0.0–0.1)
Basophils Relative: 0 %
Eosinophils Absolute: 0.3 10*3/uL (ref 0.0–0.5)
Eosinophils Relative: 6 %
HCT: 32 % — ABNORMAL LOW (ref 36.0–46.0)
Hemoglobin: 10.2 g/dL — ABNORMAL LOW (ref 12.0–15.0)
Immature Granulocytes: 1 %
Lymphocytes Relative: 26 %
Lymphs Abs: 1.6 10*3/uL (ref 0.7–4.0)
MCH: 32.4 pg (ref 26.0–34.0)
MCHC: 31.9 g/dL (ref 30.0–36.0)
MCV: 101.6 fL — ABNORMAL HIGH (ref 80.0–100.0)
Monocytes Absolute: 0.5 10*3/uL (ref 0.1–1.0)
Monocytes Relative: 8 %
Neutro Abs: 3.6 10*3/uL (ref 1.7–7.7)
Neutrophils Relative %: 59 %
Platelet Count: 161 10*3/uL (ref 150–400)
RBC: 3.15 MIL/uL — ABNORMAL LOW (ref 3.87–5.11)
RDW: 15.8 % — ABNORMAL HIGH (ref 11.5–15.5)
WBC Count: 6 10*3/uL (ref 4.0–10.5)
nRBC: 0 % (ref 0.0–0.2)

## 2022-03-04 LAB — CMP (CANCER CENTER ONLY)
ALT: 10 U/L (ref 0–44)
AST: 13 U/L — ABNORMAL LOW (ref 15–41)
Albumin: 4.3 g/dL (ref 3.5–5.0)
Alkaline Phosphatase: 66 U/L (ref 38–126)
Anion gap: 7 (ref 5–15)
BUN: 22 mg/dL — ABNORMAL HIGH (ref 6–20)
CO2: 27 mmol/L (ref 22–32)
Calcium: 9.5 mg/dL (ref 8.9–10.3)
Chloride: 106 mmol/L (ref 98–111)
Creatinine: 0.53 mg/dL (ref 0.44–1.00)
GFR, Estimated: >60 mL/min (ref >60)
Glucose, Bld: 85 mg/dL (ref 70–99)
Potassium: 4.2 mmol/L (ref 3.5–5.1)
Sodium: 140 mmol/L (ref 135–145)
Total Bilirubin: 0.4 mg/dL (ref 0.3–1.2)
Total Protein: 7.2 g/dL (ref 6.5–8.1)

## 2022-03-04 MED ORDER — HEPARIN SOD (PORK) LOCK FLUSH 100 UNIT/ML IV SOLN
500.0000 [IU] | Freq: Once | INTRAVENOUS | Status: AC
  Administered 2022-03-04: 500 [IU] via INTRAVENOUS

## 2022-03-04 MED ORDER — SODIUM CHLORIDE 0.9% FLUSH
10.0000 mL | Freq: Once | INTRAVENOUS | Status: AC
  Administered 2022-03-04: 10 mL via INTRAVENOUS

## 2022-03-05 ENCOUNTER — Other Ambulatory Visit: Payer: Self-pay

## 2022-03-05 ENCOUNTER — Ambulatory Visit
Admission: RE | Admit: 2022-03-05 | Discharge: 2022-03-05 | Disposition: A | Discharge status: Home / Self Care | Payer: BC Managed Care – PPO | Source: Ambulatory Visit | Attending: Obstetrics and Gynecology | Admitting: Obstetrics and Gynecology

## 2022-03-05 DIAGNOSIS — Z1231 Encounter for screening mammogram for malignant neoplasm of breast: Secondary | ICD-10-CM

## 2022-03-05 LAB — CANCER ANTIGEN 19-9: CA 19-9: 975 U/mL — ABNORMAL HIGH (ref 0–35)

## 2022-03-06 ENCOUNTER — Other Ambulatory Visit: Payer: Self-pay

## 2022-03-06 ENCOUNTER — Inpatient Hospital Stay: Payer: BC Managed Care – PPO

## 2022-03-06 ENCOUNTER — Inpatient Hospital Stay: Payer: BC Managed Care – PPO | Admitting: Oncology

## 2022-03-06 ENCOUNTER — Encounter: Payer: Self-pay | Admitting: *Deleted

## 2022-03-06 VITALS — BP 111/71 | HR 100 | Temp 98.1°F | Resp 18 | Ht 70.0 in | Wt 110.0 lb

## 2022-03-06 VITALS — BP 121/76 | HR 73 | Resp 18

## 2022-03-06 DIAGNOSIS — C251 Malignant neoplasm of body of pancreas: Secondary | ICD-10-CM | POA: Diagnosis not present

## 2022-03-06 MED ORDER — SODIUM CHLORIDE 0.9% FLUSH
10.0000 mL | INTRAVENOUS | Status: DC | PRN
  Administered 2022-03-06: 10 mL

## 2022-03-06 MED ORDER — SODIUM CHLORIDE 0.9 % IV SOLN
1000.0000 mg/m2 | Freq: Once | INTRAVENOUS | Status: AC
  Administered 2022-03-06: 1559 mg via INTRAVENOUS
  Filled 2022-03-06: qty 26.3

## 2022-03-06 MED ORDER — SODIUM CHLORIDE 0.9 % IV SOLN: Freq: Once | INTRAVENOUS | Status: AC

## 2022-03-06 MED ORDER — PACLITAXEL PROTEIN-BOUND CHEMO INJECTION 100 MG
100.0000 mg/m2 | Freq: Once | INTRAVENOUS | Status: AC
  Administered 2022-03-06: 150 mg via INTRAVENOUS
  Filled 2022-03-06: qty 30

## 2022-03-06 MED ORDER — PROCHLORPERAZINE MALEATE 10 MG PO TABS
10.0000 mg | ORAL_TABLET | Freq: Once | ORAL | Status: AC
  Administered 2022-03-06: 10 mg via ORAL
  Filled 2022-03-06: qty 1

## 2022-03-06 MED ORDER — HEPARIN SOD (PORK) LOCK FLUSH 100 UNIT/ML IV SOLN
500.0000 [IU] | Freq: Once | INTRAVENOUS | Status: AC | PRN
  Administered 2022-03-06: 500 [IU]

## 2022-03-06 NOTE — Progress Notes (Signed)
[place] OFFICE PROGRESS NOTE   Diagnosis: Pancreas cancer  INTERVAL HISTORY:   Ms. Lasure returns as scheduled.  She reports improvement in neuropathy symptoms.  She continues to have pain in the low abdomen and back.  She is taking Tylenol, gabapentin, and tramadol.  She no longer takes ibuprofen.  She believes gabapentin has helped the pain.  She is scheduled for a celiac block by Dr. Maryelizabeth Kaufmann next week.  Objective:  Vital signs in last 24 hours:  Blood pressure 111/71, pulse 100, temperature 98.1 F (36.7 C), temperature source Oral, resp. rate 18, height '5\' 10"'$  (1.778 m), weight 110 lb (49.9 kg), last menstrual period 11/01/2018, SpO2 99 %.    HEENT: White coat over the tongue, no buccal thrush or ulcers Resp: Clear bilaterally Cardio: Regular rate and rhythm GI: No hepatosplenomegaly, no mass, nontender Vascular: No leg edema   Portacath/PICC-without erythema  Lab Results:  Lab Results  Component Value Date   WBC 6.0 03/04/2022   HGB 10.2 (L) 03/04/2022   HCT 32.0 (L) 03/04/2022   MCV 101.6 (H) 03/04/2022   PLT 161 03/04/2022   NEUTROABS 3.6 03/04/2022    CMP  Lab Results  Component Value Date   NA 140 03/04/2022   K 4.2 03/04/2022   CL 106 03/04/2022   CO2 27 03/04/2022   GLUCOSE 85 03/04/2022   BUN 22 (H) 03/04/2022   CREATININE 0.53 03/04/2022   CALCIUM 9.5 03/04/2022   PROT 7.2 03/04/2022   ALBUMIN 4.3 03/04/2022   AST 13 (L) 03/04/2022   ALT 10 03/04/2022   ALKPHOS 66 03/04/2022   BILITOT 0.4 03/04/2022   GFRNONAA >60 03/04/2022   GFRAA  12/27/2021    QUESTIONABLE RESULTS, RECOMMEND RECOLLECT TO VERIFY    Lab Results  Component Value Date   CAN199 975 (H) 03/04/2022    No results found for: "INR", "LABPROT"  Imaging:  No results found.  Medications: I have reviewed the patient's current medications.   Assessment/Plan: Pancreas cancer, CT abdomen/pelvis 07/11/2021-irregular pancreas body mass with occlusion of the  proximal splenic vein and SMV, SMV patent distally with small filling defect likely due to thrombus, prominent subcentimeter left periotic lymph node.  Less than 180 degree abutment of the distal celiac, common hepatic, and splenic arteries, less than 180 degree abutment of the SMA CT chest 07/18/2021-no evidence of metastatic disease EUS 07/16/2021-extrinsic impression on the stomach at the posterior wall of the gastric body, no gross lesion in the duodenum, 45 x 36 mm pancreas body mass, abutment of the SMA, celiac trunk, and compression of the splenoportal confluence.  No malignant appearing lymph nodes, numerous venous collaterals adjacent to the portal vein, FNA biopsy-malignant cells consistent with adenocarcinoma, T4N0 by EUS Markedly elevated CA 19-9 [employer] 07/31/2021-K-ras G12R, MSI high-not detected, ATM VUS Cycle 1 FOLFOX 08/08/2021 Cycle 2 FOLFIRINOX 08/22/2021 CT abdomen[facility]pelvis at [facility] 08/31/2021-hypoattenuating liver lesions consistent with metastases, mass in the pancreas neck/body with greater than 100 degree encasement of the celiac axis and common hepatic artery.  Less than 180 degree abutment of the SMA, long segment occlusion of the portal splenic confluence, splenic vein occluded, multiple collateral vessels in the upper abdomen, prominent left periaortic node Cycle 3 FOLFIRINOX 09/05/2021 Cycle 4 FOLFIRINOX 09/19/2021 Cycle 5 FOLFIRINOX 10/03/2021 CTs 10/16/2021-decrease size of primary pancreas mass, hepatic metastases, and a suspicious retroperitoneal node, resolution of left supraclavicular/low jugular adenopathy compared to a CT chest 07/18/2021.  No evidence of disease progression. Cycle 6 FOLFIRINOX 10/17/2021 Cycle 7 FOLFIRINOX  10/31/2021 Cycle 8 FOLFIRINOX 11/14/2021 Cycle 9 FOLFIRINOX 11/28/2021 Cycle 10 FOLFIRINOX 12/12/2021, oxaliplatin held secondary to neuropathy symptoms CTs 12/20/2021-decrease size of pancreas primary, no evidence of hepatic metastases, no new or progressive  disease Cycle 11 FOLFIRINOX 12/27/2021, oxaliplatin held due to neuropathy symptoms Cycle 12 FOLFIRINOX 01/09/2022, oxaliplatin held 01/22/2022-C19-9 higher, 530 Cycle 13 FOLFIRINOX 01/24/2022, oxaliplatin resumed Cycle 14 FOLFIRINOX 02/06/2022 CTs 02/23/2022-increased size of the pancreas body mass new focal gastric wall thickening and loss of fat plane between the mass and the proximal gastric wall, no evidence of new or progressive metastatic disease Cycle 1 gemcitabine/Abraxane 03/06/2022 Pain secondary to #1-controlled with tramadol and Tylenol. 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 Kabler appears stable.  She reports improvement in neuropathy symptoms.  The plan is to proceed with gemcitabine/Abraxane chemotherapy today.  She will continue Tylenol, gabapentin, and tramadol for pain.  Encouraged her to try hydrocodone/APAP if the pain is not relieved with tramadol.  She is scheduled for a celiac plexus block on 03/11/2022.  Ms. Zhao will return for an office visit and cycle 2 gemcitabine/Abraxane in 2 weeks.  Betsy Coder, MD  03/06/2022  9:37 AM

## 2022-03-06 NOTE — Patient Instructions (Signed)
[facility]  Discharge Instructions: Thank you for choosing [facility] to provide your oncology and hematology care.   If you have a lab appointment with the Cancer Center, please go directly to the Cancer Center and check in at the registration area.   Wear comfortable clothing and clothing appropriate for easy access to any Portacath or PICC line.   We strive to give you quality time with your provider. You may need to reschedule your appointment if you arrive late (15 or more minutes).  Arriving late affects you and other patients whose appointments are after yours.  Also, if you miss three or more appointments without notifying the office, you may be dismissed from the clinic at the provider's discretion.      For prescription refill requests, have your pharmacy contact our office and allow 72 hours for refills to be completed.    Today you received the following chemotherapy and/or immunotherapy agents: abraxane, gemcitabine      To help prevent nausea and vomiting after your treatment, we encourage you to take your nausea medication as directed.  BELOW ARE SYMPTOMS THAT SHOULD BE REPORTED IMMEDIATELY: *FEVER GREATER THAN 100.4 F (38 C) OR HIGHER *CHILLS OR SWEATING *NAUSEA AND VOMITING THAT IS NOT CONTROLLED WITH YOUR NAUSEA MEDICATION *UNUSUAL SHORTNESS OF BREATH *UNUSUAL BRUISING OR BLEEDING *URINARY PROBLEMS (pain or burning when urinating, or frequent urination) *BOWEL PROBLEMS (unusual diarrhea, constipation, pain near the anus) TENDERNESS IN MOUTH AND THROAT WITH OR WITHOUT PRESENCE OF ULCERS (sore throat, sores in mouth, or a toothache) UNUSUAL RASH, SWELLING OR PAIN  UNUSUAL VAGINAL DISCHARGE OR ITCHING   Items with * indicate a potential emergency and should be followed up as soon as possible or go to the Emergency Department if any problems should occur.  Please show the CHEMOTHERAPY ALERT CARD or IMMUNOTHERAPY ALERT  CARD at check-in to the Emergency Department and triage nurse.  Should you have questions after your visit or need to cancel or reschedule your appointment, please contact [facility]  Dept: [phone]  and follow the prompts.  Office hours are 8:00 a.m. to 4:30 p.m. Monday - Friday. Please note that voicemails left after 4:00 p.m. may not be returned until the following business day.  We are closed weekends and major holidays. You have access to a nurse at all times for urgent questions. Please call the main number to the clinic Dept: [phone] and follow the prompts.   For any non-urgent questions, you may also contact your provider using MyChart. We now offer e-Visits for anyone 18 and older to request care online for non-urgent symptoms. For details visit [email or web address].   Also download the MyChart app! Go to the app store, search "MyChart", open the app, select [facility], and log in with your MyChart username and password.  Paclitaxel Nanoparticle Albumin-Bound Injection What is this medication? NANOPARTICLE ALBUMIN-BOUND PACLITAXEL (Na no PAHR ti kuhl al BYOO muhn-bound PAK li TAX el) treats some types of cancer. It works by slowing down the growth of cancer cells. This medicine may be used for other purposes; ask your health care provider or pharmacist if you have questions. COMMON BRAND NAME(S): Abraxane What should I tell my care team before I take this medication? They need to know if you have any of these conditions: Liver disease Low white blood cell levels An unusual or allergic reaction to paclitaxel, albumin, other medications, foods, dyes, or preservatives If you   or your partner are pregnant or trying to get pregnant Breast-feeding How should I use this medication? This medication is injected into a vein. It is given by your care team in a hospital or clinic setting. Talk to your care team about the use of this medication in  children. Special care may be needed. Overdosage: If you think you have taken too much of this medicine contact a poison control center or emergency room at once. NOTE: This medicine is only for you. Do not share this medicine with others. What if I miss a dose? Keep appointments for follow-up doses. It is important not to miss your dose. Call your care team if you are unable to keep an appointment. What may interact with this medication? Other medications may affect the way this medication works. Talk with your care team about all of the medications you take. They may suggest changes to your treatment plan to lower the risk of side effects and to make sure your medications work as intended. This list may not describe all possible interactions. Give your health care provider a list of all the medicines, herbs, non-prescription drugs, or dietary supplements you use. Also tell them if you smoke, drink alcohol, or use illegal drugs. Some items may interact with your medicine. What should I watch for while using this medication? Your condition will be monitored carefully while you are receiving this medication. You may need blood work while taking this medication. This medication may make you feel generally unwell. This is not uncommon as chemotherapy can affect healthy cells as well as cancer cells. Report any side effects. Continue your course of treatment even though you feel ill unless your care team tells you to stop. This medication can cause serious allergic reactions. To reduce the risk, your care team may give you other medications to take before receiving this one. Be sure to follow the directions from your care team. This medication may increase your risk of getting an infection. Call your care team for advice if you get a fever, chills, sore throat, or other symptoms of a cold or flu. Do not treat yourself. Try to avoid being around people who are sick. This medication may increase your risk to  bruise or bleed. Call your care team if you notice any unusual bleeding. Be careful brushing or flossing your teeth or using a toothpick because you may get an infection or bleed more easily. If you have any dental work done, tell your dentist you are receiving this medication. Talk to your care team if you or your partner may be pregnant. Serious birth defects can occur if you take this medication during pregnancy and for 6 months after the last dose. You will need a negative pregnancy test before starting this medication. Contraception is recommended while taking this medication and for 6 months after the last dose. Your care team can help you find the option that works for you. If your partner can get pregnant, use a condom during sex while taking this medication and for 3 months after the last dose. Do not breastfeed while taking this medication and for 2 weeks after the last dose. This medication may cause infertility. Talk to your care team if you are concerned about your fertility. What side effects may I notice from receiving this medication? Side effects that you should report to your care team as soon as possible: Allergic reactions--skin rash, itching, hives, swelling of the face, lips, tongue, or throat Dry cough, shortness of   breath or trouble breathing Infection--fever, chills, cough, sore throat, wounds that don't heal, pain or trouble when passing urine, general feeling of discomfort or being unwell Low red blood cell level--unusual weakness or fatigue, dizziness, headache, trouble breathing Pain, tingling, or numbness in the hands or feet Stomach pain, unusual weakness or fatigue, nausea, vomiting, diarrhea, or fever that lasts longer than expected Unusual bruising or bleeding Side effects that usually do not require medical attention (report to your care team if they continue or are bothersome): Diarrhea Fatigue Hair loss Loss of appetite Nausea Vomiting This list may not  describe all possible side effects. Call your doctor for medical advice about side effects. You may report side effects to FDA at 1-800-FDA-1088. Where should I keep my medication? This medication is given in a hospital or clinic. It will not be stored at home. NOTE: This sheet is a summary. It may not cover all possible information. If you have questions about this medicine, talk to your doctor, pharmacist, or health care provider.  2023 Elsevier/Gold Standard (2007-03-21 00:00:00)  Gemcitabine Injection What is this medication? GEMCITABINE (jem SYE ta been) treats some types of cancer. It works by slowing down the growth of cancer cells. This medicine may be used for other purposes; ask your health care provider or pharmacist if you have questions. COMMON BRAND NAME(S): Gemzar, Infugem What should I tell my care team before I take this medication? They need to know if you have any of these conditions: Blood disorders Infection Kidney disease Liver disease Lung or breathing disease, such as asthma or COPD Recent or ongoing radiation therapy An unusual or allergic reaction to gemcitabine, other medications, foods, dyes, or preservatives If you or your partner are pregnant or trying to get pregnant Breast-feeding How should I use this medication? This medication is injected into a vein. It is given by your care team in a hospital or clinic setting. Talk to your care team about the use of this medication in children. Special care may be needed. Overdosage: If you think you have taken too much of this medicine contact a poison control center or emergency room at once. NOTE: This medicine is only for you. Do not share this medicine with others. What if I miss a dose? Keep appointments for follow-up doses. It is important not to miss your dose. Call your care team if you are unable to keep an appointment. What may interact with this medication? Interactions have not been studied. This list  may not describe all possible interactions. Give your health care provider a list of all the medicines, herbs, non-prescription drugs, or dietary supplements you use. Also tell them if you smoke, drink alcohol, or use illegal drugs. Some items may interact with your medicine. What should I watch for while using this medication? Your condition will be monitored carefully while you are receiving this medication. This medication may make you feel generally unwell. This is not uncommon, as chemotherapy can affect healthy cells as well as cancer cells. Report any side effects. Continue your course of treatment even though you feel ill unless your care team tells you to stop. In some cases, you may be given additional medications to help with side effects. Follow all directions for their use. This medication may increase your risk of getting an infection. Call your care team for advice if you get a fever, chills, sore throat, or other symptoms of a cold or flu. Do not treat yourself. Try to avoid being around people who   are sick. This medication may increase your risk to bruise or bleed. Call your care team if you notice any unusual bleeding. Be careful brushing or flossing your teeth or using a toothpick because you may get an infection or bleed more easily. If you have any dental work done, tell your dentist you are receiving this medication. Avoid taking medications that contain aspirin, acetaminophen, ibuprofen, naproxen, or ketoprofen unless instructed by your care team. These medications may hide a fever. Talk to your care team if you or your partner wish to become pregnant or think you might be pregnant. This medication can cause serious birth defects if taken during pregnancy and for 6 months after the last dose. A negative pregnancy test is required before starting this medication. A reliable form of contraception is recommended while taking this medication and for 6 months after the last dose. Talk to  your care team about effective forms of contraception. Do not father a child while taking this medication and for 3 months after the last dose. Use a condom while having sex during this time period. Do not breastfeed while taking this medication and for at least 1 week after the last dose. This medication may cause infertility. Talk to your care team if you are concerned about your fertility. What side effects may I notice from receiving this medication? Side effects that you should report to your care team as soon as possible: Allergic reactions--skin rash, itching, hives, swelling of the face, lips, tongue, or throat Capillary leak syndrome--stomach or muscle pain, unusual weakness or fatigue, feeling faint or lightheaded, decrease in the amount of urine, swelling of the ankles, hands, or feet, trouble breathing Infection--fever, chills, cough, sore throat, wounds that don't heal, pain or trouble when passing urine, general feeling of discomfort or being unwell Liver injury--right upper belly pain, loss of appetite, nausea, light-colored stool, dark yellow or brown urine, yellowing skin or eyes, unusual weakness or fatigue Low red blood cell level--unusual weakness or fatigue, dizziness, headache, trouble breathing Lung injury--shortness of breath or trouble breathing, cough, spitting up blood, chest pain, fever Stomach pain, bloody diarrhea, pale skin, unusual weakness or fatigue, decrease in the amount of urine, which may be signs of hemolytic uremic syndrome Sudden and severe headache, confusion, change in vision, seizures, which may be signs of posterior reversible encephalopathy syndrome (PRES) Unusual bruising or bleeding Side effects that usually do not require medical attention (report to your care team if they continue or are bothersome): Diarrhea Drowsiness Hair loss Nausea Pain, redness, or swelling with sores inside the mouth or throat Vomiting This list may not describe all  possible side effects. Call your doctor for medical advice about side effects. You may report side effects to FDA at 1-800-FDA-1088. Where should I keep my medication? This medication is given in a hospital or clinic. It will not be stored at home. NOTE: This sheet is a summary. It may not cover all possible information. If you have questions about this medicine, talk to your doctor, pharmacist, or health care provider.  2023 Elsevier/Gold Standard (2007-03-21 00:00:00)   

## 2022-03-06 NOTE — Progress Notes (Signed)
Patient seen by Dr. Sherrill today ? ?Vitals are within treatment parameters. ? ?Labs reviewed by Dr. Sherrill and are within treatment parameters. ? ?Per physician team, patient is ready for treatment and there are NO modifications to the treatment plan.  ?

## 2022-03-07 ENCOUNTER — Other Ambulatory Visit: Payer: Self-pay | Admitting: Radiology

## 2022-03-07 ENCOUNTER — Telehealth: Payer: Self-pay

## 2022-03-07 ENCOUNTER — Other Ambulatory Visit: Payer: Self-pay

## 2022-03-07 DIAGNOSIS — C259 Malignant neoplasm of pancreas, unspecified: Secondary | ICD-10-CM

## 2022-03-07 NOTE — Telephone Encounter (Signed)
Called and spoke to patient regarding first time chemo follow-up.  Patient stated she has not had any side effects so far. Encouraged patient to continue to stay hydrated and to take nausea medications as prescribed. Patient encouraged to contact office if she had any further questions or concerns.  Patient verbalized understanding.  All questions were answered during phone call.

## 2022-03-08 ENCOUNTER — Inpatient Hospital Stay: Payer: BC Managed Care – PPO

## 2022-03-08 ENCOUNTER — Other Ambulatory Visit: Payer: Self-pay | Admitting: Student

## 2022-03-11 ENCOUNTER — Other Ambulatory Visit: Payer: Self-pay

## 2022-03-11 ENCOUNTER — Ambulatory Visit (HOSPITAL_COMMUNITY)
Admission: RE | Admit: 2022-03-11 | Discharge: 2022-03-11 | Disposition: A | Discharge status: Home / Self Care | Payer: BC Managed Care – PPO | Source: Ambulatory Visit | Attending: Oncology | Admitting: Oncology

## 2022-03-11 DIAGNOSIS — R109 Unspecified abdominal pain: Secondary | ICD-10-CM | POA: Insufficient documentation

## 2022-03-11 DIAGNOSIS — M549 Dorsalgia, unspecified: Secondary | ICD-10-CM | POA: Insufficient documentation

## 2022-03-11 DIAGNOSIS — C259 Malignant neoplasm of pancreas, unspecified: Secondary | ICD-10-CM | POA: Insufficient documentation

## 2022-03-11 DIAGNOSIS — C251 Malignant neoplasm of body of pancreas: Secondary | ICD-10-CM

## 2022-03-11 LAB — CBC
HCT: 30.1 % — ABNORMAL LOW (ref 36.0–46.0)
Hemoglobin: 9.7 g/dL — ABNORMAL LOW (ref 12.0–15.0)
MCH: 33.3 pg (ref 26.0–34.0)
MCHC: 32.2 g/dL (ref 30.0–36.0)
MCV: 103.4 fL — ABNORMAL HIGH (ref 80.0–100.0)
Platelets: 89 10*3/uL — ABNORMAL LOW (ref 150–400)
RBC: 2.91 MIL/uL — ABNORMAL LOW (ref 3.87–5.11)
RDW: 14.6 % (ref 11.5–15.5)
WBC: 2.8 10*3/uL — ABNORMAL LOW (ref 4.0–10.5)
nRBC: 0 % (ref 0.0–0.2)

## 2022-03-11 LAB — PROTIME-INR
INR: 1 (ref 0.8–1.2)
Prothrombin Time: 12.8 seconds (ref 11.4–15.2)

## 2022-03-11 MED ORDER — BUPIVACAINE HCL (PF) 0.5 % IJ SOLN: 30.0000 mL | Freq: Once | INTRAMUSCULAR | Status: DC

## 2022-03-11 MED ORDER — BUPIVACAINE HCL (PF) 0.25 % IJ SOLN
30.0000 mL | Freq: Once | INTRAMUSCULAR | Status: AC
  Administered 2022-03-11: 30 mL

## 2022-03-11 MED ORDER — FENTANYL CITRATE (PF) 100 MCG/2ML IJ SOLN
INTRAMUSCULAR | Status: AC
  Filled 2022-03-11: qty 2

## 2022-03-11 MED ORDER — MIDAZOLAM HCL 2 MG/2ML IJ SOLN
INTRAMUSCULAR | Status: AC | PRN
  Administered 2022-03-11: .5 mg via INTRAVENOUS
  Administered 2022-03-11 (×2): 1 mg via INTRAVENOUS
  Administered 2022-03-11: .5 mg via INTRAVENOUS
  Administered 2022-03-11 (×2): 1 mg via INTRAVENOUS

## 2022-03-11 MED ORDER — LIDOCAINE HCL (PF) 1 % IJ SOLN
10.0000 mL | Freq: Once | INTRAMUSCULAR | Status: AC
  Administered 2022-03-11: 10 mL via INTRADERMAL

## 2022-03-11 MED ORDER — SODIUM CHLORIDE 0.9 % IV SOLN: INTRAVENOUS | Status: DC

## 2022-03-11 MED ORDER — MIDAZOLAM HCL 2 MG/2ML IJ SOLN
INTRAMUSCULAR | Status: AC
  Filled 2022-03-11: qty 2

## 2022-03-11 MED ORDER — FENTANYL CITRATE (PF) 100 MCG/2ML IJ SOLN
INTRAMUSCULAR | Status: AC
  Filled 2022-03-11: qty 4

## 2022-03-11 MED ORDER — TRIAMCINOLONE ACETONIDE 40 MG/ML IJ SUSP (RADIOLOGY)
80.0000 mg | Freq: Once | INTRAMUSCULAR | Status: DC
  Filled 2022-03-11: qty 2

## 2022-03-11 MED ORDER — MIDAZOLAM HCL 2 MG/2ML IJ SOLN
INTRAMUSCULAR | Status: AC
  Filled 2022-03-11: qty 4

## 2022-03-11 MED ORDER — IOHEXOL 300 MG/ML  SOLN
2.0000 mL | Freq: Once | INTRAMUSCULAR | Status: AC | PRN
  Administered 2022-03-11: 2 mL via INTRA_ARTERIAL

## 2022-03-11 MED ORDER — FENTANYL CITRATE (PF) 100 MCG/2ML IJ SOLN
INTRAMUSCULAR | Status: AC | PRN
  Administered 2022-03-11 (×2): 25 ug via INTRAVENOUS
  Administered 2022-03-11 (×2): 50 ug via INTRAVENOUS
  Administered 2022-03-11: 25 ug via INTRAVENOUS

## 2022-03-11 NOTE — Procedures (Signed)
Vascular and Interventional Radiology Procedure Note  Patient: Tara Jensen DOB: July 20, 1965 Medical Record Number: [id number] Note Date/Time: 03/11/22 10:03 AM   Performing Physician: Michaelle Birks, MD Assistant(s): None  Diagnosis: Pancreatic mass with intractable abdominal pain.   Procedure: CELIAC PLEXUS NERVE BLOCK   Anesthesia: Conscious Sedation Complications: None Estimated Blood Loss:  0 mL Specimens: Sent for None   Findings:  Successful CT-guided celiac plexus nerve block, by a bilateral posterior approach. 40 mg/mL Kenalog and 0.5% Bupivacaine was administed Hemostasis of the tract was achieved using Manual Pressure.   Plan: Bed rest for 2 hours.  See detailed procedure note with images in PACS. The patient tolerated the procedure well without incident or complication and was returned to Recovery in stable condition.    Michaelle Birks, MD [facility]   Pager. [place]

## 2022-03-11 NOTE — H&P (Signed)
Chief Complaint: Patient was seen in consultation today for pancreatic cancer with abdominal pain; celiac plexus block requested.   Referring Physician(s): Ladell Pier  Supervising Physician: Michaelle Birks  Patient Status: Tara Jensen - Out-pt  History of Present Illness: Tara Jensen is a 57 y.o. female with a medical history significant for anxiety/depression and metastatic pancreatic cancer diagnosed in May 2023. She was not a candidate for surgery and has been treated with chemotherapy. Recent surveillance imaging shows disease progression. She reports worsening abdominal and back pain within the last 6 weeks. She declines to take narcotics for personal reasons and is alternating tylenol and tramadol without much relief. Her oncologist Dr. Benay Spice has recommended a celiac plexus block and she was referred to Interventional Radiology.   She met with Dr. Maryelizabeth Kaufmann 02/26/22 and he discussed the risks, benefits and alternatives to this therapy. The patient was in agreement to proceed.   Past Medical History:  Diagnosis Date   Abnormal Pap smear    Anxiety    "situational"   BV (bacterial vaginosis)    Cancer (HCC)    pancreatic   Cervical polyp    Depression    "situational"   Family history of adverse reaction to anesthesia    mother had N/V   Recurrent UTI (urinary tract infection)     Past Surgical History:  Procedure Laterality Date   BIOPSY  07/16/2021   Procedure: BIOPSY;  Surgeon: Rush Landmark, Telford Nab., MD;  Location: [facility] ENDOSCOPY;  Service: Gastroenterology;;   CERVICAL CONE BIOPSY  1996   ESOPHAGOGASTRODUODENOSCOPY (EGD) WITH PROPOFOL N/A 07/16/2021   Procedure: ESOPHAGOGASTRODUODENOSCOPY (EGD) WITH PROPOFOL;  Surgeon: Irving Copas., MD;  Location: [place];  Service: Gastroenterology;  Laterality: N/A;   FINE NEEDLE ASPIRATION  07/16/2021   Procedure: FINE NEEDLE ASPIRATION (FNA) LINEAR;  Surgeon: Irving Copas., MD;  Location: [place];   Service: Gastroenterology;;   IR RADIOLOGIST EVAL & MGMT  02/26/2022   LASIK     PORTACATH PLACEMENT Right 08/01/2021   Procedure: INSERTION PORT-A-CATH;  Surgeon: Dwan Bolt, MD;  Location: [place];  Service: General;  Laterality: Right;   UPPER ESOPHAGEAL ENDOSCOPIC ULTRASOUND (EUS) N/A 07/16/2021   Procedure: UPPER ESOPHAGEAL ENDOSCOPIC ULTRASOUND (EUS);  Surgeon: Irving Copas., MD;  Location: [place];  Service: Gastroenterology;  Laterality: N/A;    Allergies: Patient has no known allergies.  Medications: Prior to Admission medications   Medication Sig Start Date End Date Taking? Authorizing Provider  acetaminophen (TYLENOL) 500 MG tablet Take 1,000 mg by mouth every 6 (six) hours as needed for moderate pain or mild pain.    [provider]  BIOTIN PO Take 2,500 mcg by mouth daily.    [provider]  dexamethasone (DECADRON) 4 MG tablet Take 1 tablet (4 mg total) by mouth 2 (two) times daily with a meal. Take twice daily x 2 days after each chemotherapy treatment beginning day 2 of each cycle Patient not taking: Reported on 02/25/2022 02/08/22   Owens Shark, NP  diclofenac Sodium (VOLTAREN) 1 % GEL Apply 2 g topically 4 (four) times daily as needed (back pain.). 09/05/21   Ladell Pier, MD  diphenhydrAMINE (BENADRYL) 25 mg capsule Take 25 mg by mouth at bedtime. Patient not taking: Reported on 02/06/2022    [provider]  diphenhydramine-acetaminophen (TYLENOL PM) 25-500 MG TABS tablet Take 1 tablet by mouth at bedtime as needed.    [provider]  famotidine (PEPCID) 40 MG tablet Take 40  mg by mouth 2 (two) times daily. 07/24/21   [provider]  gabapentin (NEURONTIN) 300 MG capsule Take 1 capsule (300 mg total) by mouth 2 (two) times daily. 02/20/22   Ladell Pier, MD  lidocaine-prilocaine (EMLA) cream Apply 1 Application topically as needed. 02/25/22   Ladell Pier, MD  loratadine (CLARITIN) 10 MG tablet Take  10 mg by mouth daily. X 7 days after tx Patient not taking: Reported on 02/25/2022    [provider]  magic mouthwash SOLN Take 5 mLs by mouth 4 (four) times daily. Patient not taking: Reported on 08/22/2021 08/15/21   Owens Shark, NP  ondansetron (ZOFRAN) 8 MG tablet Take 1 tablet (8 mg total) by mouth every 8 (eight) hours as needed for nausea or vomiting (Starting day 4 after chemo as needed for nausea and vomiting). Patient not taking: Reported on 02/06/2022 12/28/21   Ladell Pier, MD  pantoprazole (PROTONIX) 40 MG tablet Take 40 mg by mouth daily before breakfast. 07/11/21   [provider]  polyethylene glycol (MIRALAX / GLYCOLAX) 17 g packet Take 17 g by mouth daily as needed (constipation.). Patient not taking: Reported on 02/06/2022    [provider]  prochlorperazine (COMPAZINE) 5 MG tablet Take 1-2 tablets (5-10 mg total) by mouth every 6 (six) hours as needed for nausea or vomiting. Patient not taking: Reported on 03/06/2022 09/21/21   Owens Shark, NP  Sennosides-Docusate Sodium (SENNA PLUS) 8.6-50 MG CAPS Take 2 capsules by mouth daily as needed. Patient not taking: Reported on 03/06/2022    [provider]  sertraline (ZOLOFT) 25 MG tablet Take 1 tablet (25 mg total) by mouth daily. 02/08/22   Owens Shark, NP  traMADol (ULTRAM) 50 MG tablet Take 1-2 tablets (50-100 mg total) by mouth every 6 (six) hours as needed for moderate pain. Max dose 400 mg/day 02/08/22   Owens Shark, NP     Family History  Problem Relation Age of Onset   Depression Mother    Depression Brother    Stroke Maternal Grandmother    Hypertension Maternal Grandfather    Breast cancer Cousin 42       maternal first cousin   Colon cancer Neg Hx    Esophageal cancer Neg Hx    Rectal cancer Neg Hx    Stomach cancer Neg Hx     Social History   Socioeconomic History   Marital status: Divorced    Spouse name: Not on file   Number of children: Not on file    Years of education: Not on file   Highest education level: Not on file  Occupational History   Not on file  Tobacco Use   Smoking status: Never   Smokeless tobacco: Never  Vaping Use   Vaping Use: Never used  Substance and Sexual Activity   Alcohol use: Not Currently   Drug use: No   Sexual activity: Not Currently    Birth control/protection: Inserts  Other Topics Concern   Not on file  Social History Narrative   Not on file   Social Determinants of Health   Financial Resource Strain: Low Risk  (09/08/2019)   Overall Financial Resource Strain (CARDIA)    Difficulty of Paying Living Expenses: Not hard at all  Food Insecurity: No Food Insecurity (09/08/2019)   Hunger Vital Sign    Worried About Running Out of Food in the Last Year: Never true    Ran Out of Food in the  Last Year: Never true  Transportation Needs: No Transportation Needs (09/08/2019)   PRAPARE - Hydrologist (Medical): No    Lack of Transportation (Non-Medical): No  Physical Activity: Sufficiently Active (09/08/2019)   Exercise Vital Sign    Days of Exercise per Week: 7 days    Minutes of Exercise per Session: 60 min  Stress: No Stress Concern Present (09/08/2019)   [place]    Feeling of Stress : Not at all  Social Connections: Moderately Integrated (09/08/2019)   Social Connection and Isolation Panel [NHANES]    Frequency of Communication with Friends and Family: More than three times a week    Frequency of Social Gatherings with Friends and Family: Twice a week    Attends Religious Services: More than 4 times per year    Active Member of [employer] or Organizations: Yes    Attends Music therapist: More than 4 times per year    Marital Status: Divorced    Review of Systems: A 12 point ROS discussed and pertinent positives are indicated in the HPI above.  All other systems are negative.  Review of Systems   Constitutional:  Positive for appetite change and fatigue.  Respiratory:  Negative for cough and shortness of breath.   Cardiovascular:  Negative for chest pain and leg swelling.  Gastrointestinal:  Positive for abdominal pain. Negative for nausea and vomiting.  Neurological:  Negative for dizziness and headaches.    Vital Signs: BP 110/68   Pulse 86   Temp 97.8 F (36.6 C) (Temporal)   Resp 18   Ht '5\' 10"'$  (1.778 m)   Wt 108 lb (49 kg)   LMP 11/01/2018   SpO2 98%   BMI 15.50 kg/m   Physical Exam Constitutional:      General: She is not in acute distress.    Appearance: She is not ill-appearing.  HENT:     Mouth/Throat:     Mouth: Mucous membranes are moist.     Pharynx: Oropharynx is clear.  Cardiovascular:     Rate and Rhythm: Normal rate and regular rhythm.     Pulses: Normal pulses.     Heart sounds: Normal heart sounds.  Pulmonary:     Effort: Pulmonary effort is normal.     Breath sounds: Normal breath sounds.  Abdominal:     General: Bowel sounds are normal.     Palpations: Abdomen is soft.     Tenderness: There is no abdominal tenderness.  Skin:    General: Skin is warm and dry.  Neurological:     Mental Status: She is alert and oriented to person, place, and time.     Imaging: MM 3D SCREEN BREAST BILATERAL  Result Date: 03/06/2022 CLINICAL DATA:  Screening. EXAM: DIGITAL SCREENING BILATERAL MAMMOGRAM WITH TOMOSYNTHESIS AND CAD TECHNIQUE: Bilateral screening digital craniocaudal and mediolateral oblique mammograms were obtained. Bilateral screening digital breast tomosynthesis was performed. The images were evaluated with computer-aided detection. COMPARISON:  Previous exam(s). ACR Breast Density Category c: The breast tissue is heterogeneously dense, which may obscure small masses. FINDINGS: There are no findings suspicious for malignancy. IMPRESSION: No mammographic evidence of malignancy. A result letter of this screening mammogram will be mailed directly  to the patient. RECOMMENDATION: Screening mammogram in one year. (Code:SM-B-01Y) BI-RADS CATEGORY  1: Negative. Electronically Signed   By: Ammie Ferrier M.D.   On: 03/06/2022 16:20   IR Radiologist Eval & Mgmt  Result Date: 02/26/2022 EXAM: NEW PATIENT OFFICE VISIT CHIEF COMPLAINT: See below HISTORY OF PRESENT ILLNESS: See below REVIEW OF SYSTEMS: See below PHYSICAL EXAMINATION: See below ASSESSMENT AND PLAN: Please refer to completed note in the electronic medical record on Plymouth Mugweru, MD [facility] Electronically Signed   By: Michaelle Birks M.D.   On: 02/26/2022 14:15   CT CHEST ABDOMEN PELVIS W CONTRAST  Result Date: 02/24/2022 CLINICAL DATA:  Restaging pancreatic cancer.  * Tracking Code: BO * EXAM: CT CHEST, ABDOMEN, AND PELVIS WITH CONTRAST TECHNIQUE: Multidetector CT imaging of the chest, abdomen and pelvis was performed following the standard protocol during bolus administration of intravenous contrast. RADIATION DOSE REDUCTION: This exam was performed according to the departmental dose-optimization program which includes automated exposure control, adjustment of the mA and/or kV according to patient size and/or use of iterative reconstruction technique. CONTRAST:  58m OMNIPAQUE IOHEXOL 300 MG/ML  SOLN COMPARISON:  Multiple priors including most recent CT December 20, 2021 FINDINGS: CT CHEST FINDINGS Cardiovascular: Accessed right chest Port-A-Cath with tip in the low SVC. Normal caliber thoracic aorta. No central pulmonary embolus on this nondedicated study. Normal size heart. No significant pericardial effusion/thickening. Mediastinum/Nodes: No supraclavicular adenopathy. No suspicious thyroid nodule. No pathologically enlarged mediastinal, hilar or axillary lymph nodes. The esophagus is grossly unremarkable. Lungs/Pleura: No suspicious pulmonary nodules or masses. Near-complete resolution of the right lower lobe  ground-glass opacity on image 134/4. Probable subpleural lymph node along the right major fissure on image 71/4 is unchanged. No pleural effusion. No pneumothorax. Musculoskeletal: No aggressive lytic or blastic lesion of bone. CT ABDOMEN PELVIS FINDINGS Hepatobiliary: No suspicious hepatic lesion. Previously described hepatic metastasis are not evident. Gallbladder is decompressed. No biliary ductal dilation. Pancreas: Increased size of the mass in the pancreatic neck/body junction measuring 4.0 x 2.3 cm on image 73/2 previously 2.6 x 2.4 cm. There is a loss of fat plane between the proximal gastric antral wall in the mass for instance on image 74/2 and 75/2 with focal wall thickening of the stomach in this area. Similar pancreatic tail and upstream body atrophy with ductal dilation. Spleen: No splenomegaly or focal splenic lesion. Adrenals/Urinary Tract: Bilateral adrenal glands are within normal limits. No suspicious renal mass. No hydronephrosis. Urinary bladder is unremarkable for degree of distension. Stomach/Bowel: New focal gastric wall thickening as above. No pathologic dilation of small or large bowel. Hepatic stool burden suggestive of constipation. Vascular/Lymphatic: Aortic atherosclerosis. 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 Similar abutment without encasement of the SMA and distal celiac artery at its branches. No pathologically enlarged abdominal or pelvic lymph nodes identified. Reproductive: Uterus and bilateral adnexa are unremarkable. Other: No significant abdominopelvic free fluid. No discrete peritoneal or omental nodularity. Musculoskeletal: No aggressive lytic or blastic lesion of bone. Right pubic bone island unchanged. Transitional lumbosacral anatomy. IMPRESSION: 1. Increased size of the pancreatic neck/body junction mass with new focal gastric wall thickening and loss of fat plane between the mass and the proximal gastric antral wall concerning  for primary disease progression and new gastric involvement. Consider further evaluation with EUS. 2. No evidence of new or progressive metastatic disease within the chest, abdomen or pelvis. 3. Near-complete resolution of the right lower lobe ground-glass opacity, consistent with resolving infectious or inflammatory process. 4.  Aortic Atherosclerosis (ICD10-I70.0). These results will be called to the ordering clinician or representative by the Radiologist Assistant, and communication documented in the PACS or [employer] Electronically  Signed   By: Dahlia Bailiff M.D.   On: 02/24/2022 10:16    Labs:  CBC: Recent Labs    02/01/22 0908 02/18/22 0949 03/04/22 1012 03/11/22 0854  WBC 18.0* 12.2* 6.0 2.8*  HGB 10.0* 10.4* 10.2* 9.7*  HCT 31.7* 32.6* 32.0* 30.1*  PLT 140* 180 161 89*    COAGS: Recent Labs    03/11/22 0854  INR 1.0    BMP: Recent Labs    12/27/21 0925 12/27/21 1008 01/22/22 1144 02/01/22 0908 02/18/22 0949 03/04/22 1012  NA QUESTIONABLE RESULTS, RECOMMEND RECOLLECT TO VERIFY   < > 139 139 141 140  K QUESTIONABLE RESULTS, RECOMMEND RECOLLECT TO VERIFY   < > 3.9 4.0 3.9 4.2  CL QUESTIONABLE RESULTS, RECOMMEND RECOLLECT TO VERIFY   < > 105 103 106 106  CO2 QUESTIONABLE RESULTS, RECOMMEND RECOLLECT TO VERIFY   < > '25 27 27 27  '$ GLUCOSE QUESTIONABLE RESULTS, RECOMMEND RECOLLECT TO VERIFY   < > 108* 105* 134* 85  BUN QUESTIONABLE RESULTS, RECOMMEND RECOLLECT TO VERIFY   < > '15 16 20 '$ 22*  CALCIUM QUESTIONABLE RESULTS, RECOMMEND RECOLLECT TO VERIFY   < > 9.2 9.0 9.3 9.5  CREATININE QUESTIONABLE RESULTS, RECOMMEND RECOLLECT TO VERIFY   < > 0.57 0.55 0.51 0.53  GFRNONAA QUESTIONABLE RESULTS, RECOMMEND RECOLLECT TO VERIFY   < > >60 >60 >60 >60  GFRAA QUESTIONABLE RESULTS, RECOMMEND RECOLLECT TO VERIFY  --   --   --   --   --    < > = values in this interval not displayed.    LIVER FUNCTION TESTS: Recent Labs    01/22/22 1144 02/01/22 0908 02/18/22 0949  03/04/22 1012  BILITOT 0.3 0.2* 0.2* 0.4  AST 18 12* 14* 13*  ALT '16 14 19 10  '$ ALKPHOS 129* 145* 113 66  PROT 6.8 7.0 6.9 7.2  ALBUMIN 4.2 4.1 4.3 4.3    TUMOR MARKERS: No results for input(s): "AFPTM", "CEA", "CA199", "CHROMGRNA" in the last 8760 hours.  Assessment and Plan:  Metastatic pancreatic cancer with worsening abdominal and back pain: Tara Jensen, 57 year old female, presents today to the [facility] department for an image-guided celiac plexus block.   Risks and benefits discussed with the patient including bleeding, infection, damage to adjacent structures, and sepsis.  All of the patient's questions were answered, patient is agreeable to proceed. She has been NPO.   Consent signed and in chart.  Thank you for this interesting consult.  I greatly enjoyed meeting Tara Jensen and look forward to participating in their care.  A copy of this report was sent to the requesting provider on this date.  Electronically Signed: Soyla Dryer, AGACNP-BC [phone] 03/11/2022, 9:19 AM   I spent a total of  30 Minutes   in face to face in clinical consultation, greater than 50% of which was counseling/coordinating care for celiac plexus block.

## 2022-03-12 ENCOUNTER — Encounter: Payer: Self-pay | Admitting: Oncology

## 2022-03-12 ENCOUNTER — Other Ambulatory Visit: Payer: Self-pay | Admitting: Nurse Practitioner

## 2022-03-12 ENCOUNTER — Encounter: Payer: Self-pay | Admitting: Interventional Radiology

## 2022-03-12 ENCOUNTER — Telehealth (HOSPITAL_COMMUNITY): Payer: Self-pay | Admitting: Interventional Radiology

## 2022-03-12 ENCOUNTER — Other Ambulatory Visit: Payer: Self-pay

## 2022-03-12 ENCOUNTER — Encounter: Payer: Self-pay | Admitting: Nurse Practitioner

## 2022-03-12 DIAGNOSIS — C251 Malignant neoplasm of body of pancreas: Secondary | ICD-10-CM

## 2022-03-12 MED ORDER — GABAPENTIN 300 MG PO CAPS: 300.0000 mg | ORAL_CAPSULE | Freq: Three times a day (TID) | ORAL | 1 refills | Status: DC

## 2022-03-12 MED ORDER — TRAMADOL HCL 50 MG PO TABS: 50.0000 mg | ORAL_TABLET | Freq: Four times a day (QID) | ORAL | 0 refills | Status: DC | PRN

## 2022-03-12 NOTE — Progress Notes (Signed)
Vascular and Interventional Radiology  Phone Note  Patient: Tara Jensen DOB: [birth year] Medical Record Number: [id number] Note Date/Time: 03/12/22 8:12 AM   Diagnosis: Pancreatic mass with intractable abdominal pain.    I identified myself to the patient and conveyed my credentials to Ms.[name] For medical emergencies, Pt was advised to call 911 or go to the nearest emergency room.   Assessment  Plan: 57 y.o. year old female POD 1 s/p celiac plexus block. VIR reached out in courtesy follow-up.  Pt reports that they had complete relief from abdominal and back discomfort immediately post procedure, and when her and I last spoke at ~5P yesterday. She did experience some return of "back" pain at ~9P, I suspect her immediate relief may have been anesthetic related.  She rates her discomfort this AM at 6/10, comparing it to 8/10 same time last week. ROS is otherwise negative. No concern at this time.    Follow up Will schedule a phone call with her later this week, once the CPB has equilibrated. Pt to follow up with me in Clinic within 1 month, to discuss potential neurolysis.  As part of this Telephone encounter, no in-person exam was conducted.  The patient was physically located in [place] or a state in which I am permitted to provide care. The encounter was reasonable and appropriate under the circumstances given the patient's presentation at the time.   Michaelle Birks, MD [facility]   Pager. [place]

## 2022-03-13 ENCOUNTER — Other Ambulatory Visit: Payer: Self-pay | Admitting: Interventional Radiology

## 2022-03-13 ENCOUNTER — Encounter: Payer: Self-pay | Admitting: *Deleted

## 2022-03-13 ENCOUNTER — Other Ambulatory Visit: Payer: Self-pay | Admitting: Nurse Practitioner

## 2022-03-13 DIAGNOSIS — C251 Malignant neoplasm of body of pancreas: Secondary | ICD-10-CM

## 2022-03-13 MED ORDER — MORPHINE SULFATE ER 15 MG PO TBCR: 15.0000 mg | EXTENDED_RELEASE_TABLET | Freq: Two times a day (BID) | ORAL | 0 refills | Status: DC

## 2022-03-13 NOTE — Progress Notes (Signed)
PATIENT NAVIGATOR PROGRESS NOTE  Name: Tara Jensen Date: 03/13/2022 MRN: [id number]  DOB: [birth year]   Reason for visit:  Telephone call to discuss pain management  Comments:  Returned [name]'s phone call to discuss pain management.  She is currently taking  Tramadol '100mg'$  every 6 hours with Tylenol  Gabapentin '300mg'$  every 6 hours   Rates pain at 8-10, feels homebound with little quality of life, not able to leave the house  After discussion with Dr Benay Spice and Ned Card NP, [name] has agreed to MS Contin '15mg'$  every 12 hours and using Tramadol for breakthrough  She will continue Gabapentin as well.   She has been scheduled to see Dr Benay Spice on 03/15/22 at 0800 for review and evaluation if new pain medicine regimen is effective    Time spent counseling/coordinating care: > 60 minutes

## 2022-03-15 ENCOUNTER — Other Ambulatory Visit: Payer: Self-pay

## 2022-03-15 ENCOUNTER — Inpatient Hospital Stay: Payer: BC Managed Care – PPO | Attending: Oncology | Admitting: Oncology

## 2022-03-15 VITALS — BP 123/77 | HR 83 | Temp 98.1°F | Resp 20 | Ht 70.0 in | Wt 107.6 lb

## 2022-03-15 DIAGNOSIS — C251 Malignant neoplasm of body of pancreas: Secondary | ICD-10-CM | POA: Diagnosis not present

## 2022-03-15 DIAGNOSIS — C787 Secondary malignant neoplasm of liver and intrahepatic bile duct: Secondary | ICD-10-CM | POA: Diagnosis present

## 2022-03-15 DIAGNOSIS — Z79899 Other long term (current) drug therapy: Secondary | ICD-10-CM | POA: Insufficient documentation

## 2022-03-15 DIAGNOSIS — Z5111 Encounter for antineoplastic chemotherapy: Secondary | ICD-10-CM | POA: Diagnosis present

## 2022-03-15 NOTE — Progress Notes (Signed)
[facility] OFFICE PROGRESS NOTE   Diagnosis: Pancreas cancer  INTERVAL HISTORY:   Tara Jensen completed a cycle of gemcitabine/Abraxane 03/06/2022.  She tolerated the treatment well.  No rash or fever.  Neuropathy symptoms remain improved.  She continues gabapentin. Her chief complaint is back pain.  She underwent a celiac plexus block on 03/11/2022.  The pain is not improved. She takes Tylenol and tramadol as needed.  She began MS Contin on 03/13/2022.  She was able to sleep through the night yesterday without pain.  Objective:  Vital signs in last 24 hours:  Blood pressure 123/77, pulse 83, temperature 98.1 F (36.7 C), temperature source Oral, resp. rate 20, height '5\' 10"'$  (1.778 m), weight 107 lb 9.6 oz (48.8 kg), last menstrual period 11/01/2018, SpO2 100 %.  Resp: Lungs clear bilaterally Cardio: Regular rate and rhythm GI: No hepatosplenomegaly Vascular: No leg edema    Portacath/PICC-without erythema  Lab Results:  Lab Results  Component Value Date   WBC 2.8 (L) 03/11/2022   HGB 9.7 (L) 03/11/2022   HCT 30.1 (L) 03/11/2022   MCV 103.4 (H) 03/11/2022   PLT 89 (L) 03/11/2022   NEUTROABS 3.6 03/04/2022    CMP  Lab Results  Component Value Date   NA 140 03/04/2022   K 4.2 03/04/2022   CL 106 03/04/2022   CO2 27 03/04/2022   GLUCOSE 85 03/04/2022   BUN 22 (H) 03/04/2022   CREATININE 0.53 03/04/2022   CALCIUM 9.5 03/04/2022   PROT 7.2 03/04/2022   ALBUMIN 4.3 03/04/2022   AST 13 (L) 03/04/2022   ALT 10 03/04/2022   ALKPHOS 66 03/04/2022   BILITOT 0.4 03/04/2022   GFRNONAA >60 03/04/2022   GFRAA  12/27/2021    QUESTIONABLE RESULTS, RECOMMEND RECOLLECT TO VERIFY    Lab Results  Component Value Date   CAN199 975 (H) 03/04/2022    Lab Results  Component Value Date   INR 1.0 03/11/2022   LABPROT 12.8 03/11/2022    Imaging:  CT CELIAC PLEXUS BLOCK ANESTHETIC  Result Date: 03/11/2022 INDICATION: pancreas cancer, severe abdominal/back  pain EXAM: CT-GUIDED CELIAC PLEXUS NERVE BLOCK, DIAGNOSTIC AND THERAPEUTIC COMPARISON:  CT CAP, 02/23/2022 MEDICATIONS: 80 mg Kenalog and 30 mL 0.5% bupivacaine. ANESTHESIA/SEDATION: Moderate (conscious) sedation was employed during this procedure. A total of Versed 5 mg and Fentanyl 175 mcg was administered intravenously. Moderate Sedation Time: 45 minutes. The patient's level of consciousness and vital signs were monitored continuously by radiology nursing throughout the procedure under my direct supervision. CONTRAST:  2 mL Omnipaque 300 FLUOROSCOPY TIME:  CT dose in mGy was not provided. COMPLICATIONS: None immediate. PROCEDURE: RADIATION DOSE REDUCTION: This exam was performed according to the departmental dose-optimization program which includes automated exposure control, adjustment of the mA and/or kV according to patient size and/or use of iterative reconstruction technique. Informed consent was obtained from the patient following an explanation of the procedure, risks, benefits and alternatives. A time out was performed prior to the initiation of the procedure. The patient was positioned prone on the CT table and a limited CT was performed for procedural planning demonstrating an adequate window at the upper abdomen. The procedure was planned. A timeout was performed prior to the initiation of the procedure. The operative site was prepped and draped in the usual sterile fashion. Appropriate trajectory was confirmed with a 22 gauge spinal needle after the adjacent tissues were anesthetized with 1% Lidocaine with epinephrine. Under intermittent CT guidance, 15 cm 21-gauge Chiba needles were inserted  via a posterior approach and the needle tips were positioned in the retroperitoneal space immediately anterolateral to the aorta, at the region of the celiac trunk. A small amount of dilute contrast was injected through the needles to confirm position. A solution containing 2 mL of 40 mg/mL Kenalog and 15 mL of  Bupivacaine was mixed and injected through each needle. Intermittent CT scanning confirmed appropriate spread into the extra vascular spaces. The needles were removed and hemostasis was achieved with manual compression. A limited postprocedural CT was negative for hemorrhage or additional complication. A dressing was placed. The patient tolerated the procedure well without immediate postprocedural complication. IMPRESSION: Successful CT-guided diagnostic and therapeutic celiac plexus nerve block, as above. PLAN: The patient will be re-evaluated in the postprocedural setting and again in 3-6 months to assess the efficacy of this nerve block. If successful, the patient may be a candidate for future celiac plexus nerve blocks versus neurolysis. Michaelle Birks, MD [facility] Electronically Signed   By: Michaelle Birks M.D.   On: 03/11/2022 16:16    Medications: I have reviewed the patient's current medications.   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  Less than 180 degree abutment of the distal celiac, common hepatic, and splenic arteries, less than 180 degree abutment of the SMA CT chest 07/18/2021-no evidence of metastatic disease EUS 07/16/2021-extrinsic impression on the stomach at the posterior wall of the gastric body, no gross lesion in the duodenum, 45 x 36 mm pancreas body mass, abutment of the SMA, celiac trunk, and compression of the splenoportal confluence.  No malignant appearing lymph nodes, numerous venous collaterals adjacent to the portal vein, FNA biopsy-malignant cells consistent with adenocarcinoma, T4N0 by EUS Markedly elevated CA 19-9 Guardant360 07/31/2021-K-ras G12R, MSI high-not detected, ATM VUS Cycle 1 FOLFOX  08/08/2021 Cycle 2 FOLFIRINOX 08/22/2021 CT abdomen/pelvis at [facility] 08/31/2021-hypoattenuating liver lesions consistent with metastases, mass in the pancreas neck/body with greater than 100 degree encasement of the celiac axis and common hepatic artery.  Less than 180 degree abutment of the SMA, long segment occlusion of the portal splenic confluence, splenic vein occluded, multiple collateral vessels in the upper abdomen, prominent left periaortic node Cycle 3 FOLFIRINOX 09/05/2021 Cycle 4 FOLFIRINOX 09/19/2021 Cycle 5 FOLFIRINOX 10/03/2021 CTs 10/16/2021-decrease size of primary pancreas mass, hepatic metastases, and a suspicious retroperitoneal node, resolution of left supraclavicular/low jugular adenopathy compared to a CT chest 07/18/2021.  No evidence of disease progression. Cycle 6 FOLFIRINOX 10/17/2021 Cycle 7 FOLFIRINOX 10/31/2021 Cycle 8 FOLFIRINOX 11/14/2021 Cycle 9 FOLFIRINOX 11/28/2021 Cycle 10 FOLFIRINOX 12/12/2021, oxaliplatin held secondary to neuropathy symptoms CTs 12/20/2021-decrease size of pancreas primary, no evidence of hepatic metastases, no new or progressive disease Cycle 11 FOLFIRINOX 12/27/2021, oxaliplatin held due to neuropathy symptoms Cycle 12 FOLFIRINOX 01/09/2022, oxaliplatin held 01/22/2022-C19-9 higher, 530 Cycle 13 FOLFIRINOX 01/24/2022, oxaliplatin resumed Cycle 14 FOLFIRINOX 02/06/2022 CTs 02/23/2022-increased size of the pancreas body mass new focal gastric wall thickening and loss of fat plane between the mass and the proximal gastric wall, no evidence of new or progressive metastatic disease Cycle 1 gemcitabine/Abraxane 03/06/2022 Pain secondary to #1-controlled with tramadol and Tylenol. 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  She began salvage therapy with gemcitabine/Abraxane last week.  She tolerated the first cycle of treatment well. She has pain secondary to the locally advanced tumor.  She began a trial of MS Contin on 03/13/2022.  She will continue MS Contin.  She will use Tylenol and tramadol for breakthrough pain.  We will increase the MS Contin dose if she continues to have significant pain.  Tara Jensen will return for an office visit and cycle 2 chemotherapy on 03/20/2022.  Betsy Coder, MD  03/15/2022  1:12 PM

## 2022-03-17 ENCOUNTER — Other Ambulatory Visit: Payer: Self-pay | Admitting: Oncology

## 2022-03-18 ENCOUNTER — Inpatient Hospital Stay: Payer: BC Managed Care – PPO

## 2022-03-18 ENCOUNTER — Encounter: Payer: Self-pay | Admitting: *Deleted

## 2022-03-18 ENCOUNTER — Telehealth: Payer: Self-pay | Admitting: *Deleted

## 2022-03-18 NOTE — Progress Notes (Signed)
[facility] CSW Progress Note  Clinical Education officer, museum contacted patient by phone to assess needs and to provide support.  She was able to purchase a wig which has increased her mood.  She is currently having to address pain issues with minimal success.  She expressed no other issues at this time.    [name] Marsha Gundlach, LCSW

## 2022-03-18 NOTE — Telephone Encounter (Addendum)
Ms. Haughton had called answering service on 03/17/22 for severe pain in back, side and stomach. This RN called and left VM requesting return call to update on status of her pain and medications being taken. @ 1159: Left 2nd message for patient to call. @ 1305: Call from her caregiver, Brayton Caves reporting her pain over the weekend and is asking for more guidance from MD on how to reduce the spikes in pain she is having? Does not feel they have good control of her pain:  Friday pm--took the 15 mg MS Contin Saturday--pain 9/10 by afternoon and took '30mg'$  MS Contin and was still a difficult night Sunday am-no pain/ Sunday pm 7/10-30 mg taken Monday--am OK, pain coming back now. Taking Gabapentin q 6 hours and maximum Tramadol/day (400 mg) and Tylenol #6 tablets/day

## 2022-03-18 NOTE — Telephone Encounter (Signed)
Per Dr. Benay Spice: Increase MS Contin to 30 mg every 12 hours. He is willing to order MSIR for breakthrough pain if she agrees, otherwise, continue gabapentin, Tramadol,Tylenol as they are doing. Ms. Erskine Emery made aware.

## 2022-03-19 ENCOUNTER — Telehealth (HOSPITAL_COMMUNITY): Payer: Self-pay

## 2022-03-19 NOTE — Telephone Encounter (Signed)
Called to schedule celiac plexus. Pt would like to wait until she speaks with her oncologist. She said that she just had one done last week and she isn't sure that she wants to go through it again. She will call me or the clinic back if she wants to schedule. AB

## 2022-03-20 ENCOUNTER — Encounter: Payer: Self-pay | Admitting: Nurse Practitioner

## 2022-03-20 ENCOUNTER — Inpatient Hospital Stay (HOSPITAL_BASED_OUTPATIENT_CLINIC_OR_DEPARTMENT_OTHER): Payer: BC Managed Care – PPO | Admitting: Nurse Practitioner

## 2022-03-20 ENCOUNTER — Other Ambulatory Visit: Payer: Self-pay

## 2022-03-20 ENCOUNTER — Inpatient Hospital Stay (HOSPITAL_BASED_OUTPATIENT_CLINIC_OR_DEPARTMENT_OTHER)
Admission: EM | Admit: 2022-03-20 | Discharge: 2022-03-22 | DRG: 391 | Disposition: A | Discharge status: Home / Self Care | Payer: BC Managed Care – PPO | Attending: Internal Medicine | Admitting: Internal Medicine

## 2022-03-20 ENCOUNTER — Encounter (HOSPITAL_BASED_OUTPATIENT_CLINIC_OR_DEPARTMENT_OTHER): Payer: Self-pay | Admitting: Emergency Medicine

## 2022-03-20 ENCOUNTER — Inpatient Hospital Stay: Payer: BC Managed Care – PPO

## 2022-03-20 ENCOUNTER — Emergency Department (HOSPITAL_BASED_OUTPATIENT_CLINIC_OR_DEPARTMENT_OTHER): Payer: BC Managed Care – PPO

## 2022-03-20 VITALS — BP 135/78 | HR 74 | Resp 18

## 2022-03-20 VITALS — BP 135/62 | HR 86 | Temp 97.9°F | Resp 18 | Ht 70.0 in | Wt 105.2 lb

## 2022-03-20 DIAGNOSIS — Z803 Family history of malignant neoplasm of breast: Secondary | ICD-10-CM

## 2022-03-20 DIAGNOSIS — G620 Drug-induced polyneuropathy: Secondary | ICD-10-CM | POA: Diagnosis present

## 2022-03-20 DIAGNOSIS — T402X5A Adverse effect of other opioids, initial encounter: Secondary | ICD-10-CM | POA: Diagnosis present

## 2022-03-20 DIAGNOSIS — R109 Unspecified abdominal pain: Secondary | ICD-10-CM | POA: Diagnosis present

## 2022-03-20 DIAGNOSIS — Z79899 Other long term (current) drug therapy: Secondary | ICD-10-CM

## 2022-03-20 DIAGNOSIS — C251 Malignant neoplasm of body of pancreas: Secondary | ICD-10-CM

## 2022-03-20 DIAGNOSIS — Z823 Family history of stroke: Secondary | ICD-10-CM

## 2022-03-20 DIAGNOSIS — R1084 Generalized abdominal pain: Secondary | ICD-10-CM | POA: Diagnosis not present

## 2022-03-20 DIAGNOSIS — F32A Depression, unspecified: Secondary | ICD-10-CM | POA: Diagnosis present

## 2022-03-20 DIAGNOSIS — F419 Anxiety disorder, unspecified: Secondary | ICD-10-CM | POA: Diagnosis present

## 2022-03-20 DIAGNOSIS — G893 Neoplasm related pain (acute) (chronic): Secondary | ICD-10-CM | POA: Diagnosis present

## 2022-03-20 DIAGNOSIS — K219 Gastro-esophageal reflux disease without esophagitis: Secondary | ICD-10-CM | POA: Diagnosis present

## 2022-03-20 DIAGNOSIS — Z8249 Family history of ischemic heart disease and other diseases of the circulatory system: Secondary | ICD-10-CM

## 2022-03-20 DIAGNOSIS — Z79891 Long term (current) use of opiate analgesic: Secondary | ICD-10-CM

## 2022-03-20 DIAGNOSIS — C259 Malignant neoplasm of pancreas, unspecified: Secondary | ICD-10-CM | POA: Diagnosis present

## 2022-03-20 DIAGNOSIS — E43 Unspecified severe protein-calorie malnutrition: Secondary | ICD-10-CM | POA: Insufficient documentation

## 2022-03-20 DIAGNOSIS — Z818 Family history of other mental and behavioral disorders: Secondary | ICD-10-CM

## 2022-03-20 DIAGNOSIS — K5903 Drug induced constipation: Principal | ICD-10-CM | POA: Diagnosis present

## 2022-03-20 DIAGNOSIS — T451X5A Adverse effect of antineoplastic and immunosuppressive drugs, initial encounter: Secondary | ICD-10-CM | POA: Diagnosis present

## 2022-03-20 DIAGNOSIS — R739 Hyperglycemia, unspecified: Secondary | ICD-10-CM | POA: Diagnosis present

## 2022-03-20 DIAGNOSIS — Z681 Body mass index (BMI) 19 or less, adult: Secondary | ICD-10-CM

## 2022-03-20 LAB — CBC WITH DIFFERENTIAL (CANCER CENTER ONLY)
Abs Immature Granulocytes: 0.01 10*3/uL (ref 0.00–0.07)
Basophils Absolute: 0 10*3/uL (ref 0.0–0.1)
Basophils Relative: 0 %
Eosinophils Absolute: 0.1 10*3/uL (ref 0.0–0.5)
Eosinophils Relative: 2 %
HCT: 32.8 % — ABNORMAL LOW (ref 36.0–46.0)
Hemoglobin: 10.6 g/dL — ABNORMAL LOW (ref 12.0–15.0)
Immature Granulocytes: 0 %
Lymphocytes Relative: 20 %
Lymphs Abs: 1 10*3/uL (ref 0.7–4.0)
MCH: 32.5 pg (ref 26.0–34.0)
MCHC: 32.3 g/dL (ref 30.0–36.0)
MCV: 100.6 fL — ABNORMAL HIGH (ref 80.0–100.0)
Monocytes Absolute: 0.4 10*3/uL (ref 0.1–1.0)
Monocytes Relative: 8 %
Neutro Abs: 3.4 10*3/uL (ref 1.7–7.7)
Neutrophils Relative %: 70 %
Platelet Count: 186 10*3/uL (ref 150–400)
RBC: 3.26 MIL/uL — ABNORMAL LOW (ref 3.87–5.11)
RDW: 14.8 % (ref 11.5–15.5)
WBC Count: 4.9 10*3/uL (ref 4.0–10.5)
nRBC: 0 % (ref 0.0–0.2)

## 2022-03-20 LAB — URINALYSIS, ROUTINE W REFLEX MICROSCOPIC
Bilirubin Urine: NEGATIVE
Glucose, UA: NEGATIVE mg/dL
Hgb urine dipstick: NEGATIVE
Ketones, ur: 15 mg/dL — AB
Leukocytes,Ua: NEGATIVE
Nitrite: NEGATIVE
Specific Gravity, Urine: 1.018 (ref 1.005–1.030)
pH: 8 (ref 5.0–8.0)

## 2022-03-20 LAB — CMP (CANCER CENTER ONLY)
ALT: 27 U/L (ref 0–44)
AST: 16 U/L (ref 15–41)
Albumin: 4.5 g/dL (ref 3.5–5.0)
Alkaline Phosphatase: 62 U/L (ref 38–126)
Anion gap: 6 (ref 5–15)
BUN: 21 mg/dL — ABNORMAL HIGH (ref 6–20)
CO2: 29 mmol/L (ref 22–32)
Calcium: 9.6 mg/dL (ref 8.9–10.3)
Chloride: 102 mmol/L (ref 98–111)
Creatinine: 0.61 mg/dL (ref 0.44–1.00)
GFR, Estimated: >60 mL/min (ref >60)
Glucose, Bld: 122 mg/dL — ABNORMAL HIGH (ref 70–99)
Potassium: 4 mmol/L (ref 3.5–5.1)
Sodium: 137 mmol/L (ref 135–145)
Total Bilirubin: 0.4 mg/dL (ref 0.3–1.2)
Total Protein: 7.5 g/dL (ref 6.5–8.1)

## 2022-03-20 LAB — PREGNANCY, URINE: Preg Test, Ur: NEGATIVE

## 2022-03-20 MED ORDER — HYDROMORPHONE HCL 1 MG/ML IJ SOLN
1.0000 mg | Freq: Once | INTRAMUSCULAR | Status: AC
  Administered 2022-03-20: 1 mg via INTRAVENOUS
  Filled 2022-03-20: qty 1

## 2022-03-20 MED ORDER — SODIUM CHLORIDE 0.9 % IV BOLUS
1000.0000 mL | Freq: Once | INTRAVENOUS | Status: AC
  Administered 2022-03-20: 1000 mL via INTRAVENOUS

## 2022-03-20 MED ORDER — IOHEXOL 300 MG/ML  SOLN
100.0000 mL | Freq: Once | INTRAMUSCULAR | Status: AC | PRN
  Administered 2022-03-20: 60 mL via INTRAVENOUS

## 2022-03-20 MED ORDER — MORPHINE SULFATE 15 MG PO TABS: 15.0000 mg | ORAL_TABLET | ORAL | 0 refills | Status: DC | PRN

## 2022-03-20 MED ORDER — PROCHLORPERAZINE MALEATE 10 MG PO TABS
10.0000 mg | ORAL_TABLET | Freq: Once | ORAL | Status: AC
  Administered 2022-03-20: 10 mg via ORAL
  Filled 2022-03-20: qty 1

## 2022-03-20 MED ORDER — SODIUM CHLORIDE 0.9 % IV SOLN
1000.0000 mg/m2 | Freq: Once | INTRAVENOUS | Status: AC
  Administered 2022-03-20: 1559 mg via INTRAVENOUS
  Filled 2022-03-20: qty 26.28

## 2022-03-20 MED ORDER — ONDANSETRON HCL 4 MG/2ML IJ SOLN
4.0000 mg | Freq: Once | INTRAMUSCULAR | Status: AC
  Administered 2022-03-20: 4 mg via INTRAVENOUS
  Filled 2022-03-20: qty 2

## 2022-03-20 MED ORDER — HEPARIN SOD (PORK) LOCK FLUSH 100 UNIT/ML IV SOLN: 500.0000 [IU] | Freq: Once | INTRAVENOUS | Status: DC | PRN

## 2022-03-20 MED ORDER — SODIUM CHLORIDE 0.9% FLUSH: 10.0000 mL | INTRAVENOUS | Status: DC | PRN

## 2022-03-20 MED ORDER — SODIUM CHLORIDE 0.9 % IV SOLN: Freq: Once | INTRAVENOUS | Status: AC

## 2022-03-20 MED ORDER — PACLITAXEL PROTEIN-BOUND CHEMO INJECTION 100 MG
100.0000 mg/m2 | Freq: Once | INTRAVENOUS | Status: AC
  Administered 2022-03-20: 150 mg via INTRAVENOUS
  Filled 2022-03-20: qty 30

## 2022-03-20 NOTE — Progress Notes (Signed)
[facility] OFFICE PROGRESS NOTE   Diagnosis: Pancreas cancer  INTERVAL HISTORY:   Tara Jensen returns as scheduled.  She completed cycle 1 gemcitabine/Abraxane 03/06/2022.  She denies nausea/vomiting.  No mouth sores.  No diarrhea.  No change in baseline neuropathy symptoms.  Abdominal and back pain seem to be worse in the evening/night hours.  Current regimen consist of [name] every 12 hours, tramadol 4 times a day, Tylenol 4 times a day and gabapentin 4 times a day.  She took 1 hydrocodone tablet a while back but has not tried it since.  Objective:  Vital signs in last 24 hours:  Blood pressure 135/62, pulse 86, temperature 97.9 F (36.6 C), temperature source Oral, resp. rate 18, height '5\' 10"'$  (1.778 m), weight 105 lb 3.2 oz (47.7 kg), last menstrual period 11/01/2018, SpO2 100 %.    HEENT: No thrush or ulcers. Resp: Lungs clear bilaterally. Cardio: Regular rate and rhythm. GI: No hepatosplenomegaly. Vascular: No leg edema. Skin: No rash. Port-A-Cath without erythema.   Lab Results:  Lab Results  Component Value Date   WBC 4.9 03/20/2022   HGB 10.6 (L) 03/20/2022   HCT 32.8 (L) 03/20/2022   MCV 100.6 (H) 03/20/2022   PLT 186 03/20/2022   NEUTROABS 3.4 03/20/2022    Imaging:  No results found.  Medications: I have reviewed the patient's current medications.  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  Less than 180 degree abutment of the distal celiac, common hepatic, and splenic arteries, less than 180 degree abutment of the SMA CT chest 07/18/2021-no evidence of metastatic disease EUS 07/16/2021-extrinsic impression on the stomach at the posterior wall of the gastric body, no gross lesion in the duodenum, 45 x 36 mm pancreas  body mass, abutment of the SMA, celiac trunk, and compression of the splenoportal confluence.  No malignant appearing lymph nodes, numerous venous collaterals adjacent to the portal vein, FNA biopsy-malignant cells consistent with adenocarcinoma, T4N0 by EUS Markedly elevated CA 19-9 Guardant360 07/31/2021-K-ras G12R, MSI high-not detected, ATM VUS Cycle 1 FOLFOX 08/08/2021 Cycle 2 FOLFIRINOX 08/22/2021 CT abdomen/pelvis at [facility] 08/31/2021-hypoattenuating liver lesions consistent with metastases, mass in the pancreas neck/body with greater than 100 degree encasement of the celiac axis and common hepatic artery.  Less than 180 degree abutment of the SMA, long segment occlusion of the portal splenic confluence, splenic vein occluded, multiple collateral vessels in the upper abdomen, prominent left periaortic node Cycle 3 FOLFIRINOX 09/05/2021 Cycle 4 FOLFIRINOX 09/19/2021 Cycle 5 FOLFIRINOX 10/03/2021 CTs 10/16/2021-decrease size of primary pancreas mass, hepatic metastases, and a suspicious retroperitoneal node, resolution of left supraclavicular/low jugular adenopathy compared to a CT chest 07/18/2021.  No evidence of disease progression. Cycle 6 FOLFIRINOX 10/17/2021 Cycle 7 FOLFIRINOX 10/31/2021 Cycle 8 FOLFIRINOX 11/14/2021 Cycle 9 FOLFIRINOX 11/28/2021 Cycle 10 FOLFIRINOX 12/12/2021, oxaliplatin held secondary to neuropathy symptoms CTs 12/20/2021-decrease size of pancreas primary, no evidence of hepatic metastases, no new or progressive disease Cycle 11 FOLFIRINOX 12/27/2021, oxaliplatin held due to neuropathy symptoms Cycle 12 FOLFIRINOX 01/09/2022, oxaliplatin held 01/22/2022-C19-9 higher, 530 Cycle 13 FOLFIRINOX 01/24/2022, oxaliplatin resumed Cycle 14 FOLFIRINOX 02/06/2022 CTs 02/23/2022-increased size of the pancreas body mass new focal gastric wall thickening and loss of fat plane between the mass and the proximal gastric wall, no evidence of new or progressive metastatic disease Cycle 1  gemcitabine/Abraxane 03/06/2022 Cycle 2 gemcitabine/Abraxane 03/20/2022 Pain secondary to #1-controlled with tramadol and Tylenol. 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  She tolerated the first cycle of gemcitabine/Abraxane well.  Plan to proceed with cycle 2 today as scheduled.  CBC and chemistry panel reviewed.  Labs adequate to proceed as above.  We adjusted the pain regimen today.  She will take [name] every 12 hours with MSIR 15 mg 1 to 2 tablets every 4 hours as needed for breakthrough pain.  Tylenol and tramadol can be taken as needed.  She will continue gabapentin 3-4 times a day.  She understands she should not be driving when taking pain medication.  For the constipation she will try magnesium citrate.  She will return for lab, follow-up, cycle 3 gemcitabine/Abraxane in 2 weeks.  We are available to see her sooner if needed.  She understands to contact the office with poorly controlled pain.    Ned Card ANP/GNP-BC   03/20/2022  9:09 AM

## 2022-03-20 NOTE — Patient Instructions (Signed)
[facility]   Discharge Instructions: Thank you for choosing [facility] to provide your oncology and hematology care.   If you have a lab appointment with the Cancer Center, please go directly to the Cancer Center and check in at the registration area.   Wear comfortable clothing and clothing appropriate for easy access to any Portacath or PICC line.   We strive to give you quality time with your provider. You may need to reschedule your appointment if you arrive late (15 or more minutes).  Arriving late affects you and other patients whose appointments are after yours.  Also, if you miss three or more appointments without notifying the office, you may be dismissed from the clinic at the provider's discretion.      For prescription refill requests, have your pharmacy contact our office and allow 72 hours for refills to be completed.    Today you received the following chemotherapy and/or immunotherapy agents Paclitaxel-protein bound (ABRAXANE) & Gemcitabine (GEMZAR).      To help prevent nausea and vomiting after your treatment, we encourage you to take your nausea medication as directed.  BELOW ARE SYMPTOMS THAT SHOULD BE REPORTED IMMEDIATELY: *FEVER GREATER THAN 100.4 F (38 C) OR HIGHER *CHILLS OR SWEATING *NAUSEA AND VOMITING THAT IS NOT CONTROLLED WITH YOUR NAUSEA MEDICATION *UNUSUAL SHORTNESS OF BREATH *UNUSUAL BRUISING OR BLEEDING *URINARY PROBLEMS (pain or burning when urinating, or frequent urination) *BOWEL PROBLEMS (unusual diarrhea, constipation, pain near the anus) TENDERNESS IN MOUTH AND THROAT WITH OR WITHOUT PRESENCE OF ULCERS (sore throat, sores in mouth, or a toothache) UNUSUAL RASH, SWELLING OR PAIN  UNUSUAL VAGINAL DISCHARGE OR ITCHING   Items with * indicate a potential emergency and should be followed up as soon as possible or go to the Emergency Department if any problems should occur.  Please show the  CHEMOTHERAPY ALERT CARD or IMMUNOTHERAPY ALERT CARD at check-in to the Emergency Department and triage nurse.  Should you have questions after your visit or need to cancel or reschedule your appointment, please contact [facility]  Dept: [phone]  and follow the prompts.  Office hours are 8:00 a.m. to 4:30 p.m. Monday - Friday. Please note that voicemails left after 4:00 p.m. may not be returned until the following business day.  We are closed weekends and major holidays. You have access to a nurse at all times for urgent questions. Please call the main number to the clinic Dept: [phone] and follow the prompts.   For any non-urgent questions, you may also contact your provider using MyChart. We now offer e-Visits for anyone 18 and older to request care online for non-urgent symptoms. For details visit [email or web address].   Also download the MyChart app! Go to the app store, search "MyChart", open the app, select , and log in with your MyChart username and password.  Paclitaxel Nanoparticle Albumin-Bound Injection What is this medication? NANOPARTICLE ALBUMIN-BOUND PACLITAXEL (Na no PAHR ti kuhl al BYOO muhn-bound PAK li TAX el) treats some types of cancer. It works by slowing down the growth of cancer cells. This medicine may be used for other purposes; ask your health care provider or pharmacist if you have questions. COMMON BRAND NAME(S): Abraxane What should I tell my care team before I take this medication? They need to know if you have any of these conditions: Liver disease Low white blood cell levels An unusual or allergic reaction to paclitaxel, albumin, other medications, foods,   dyes, or preservatives If you or your partner are pregnant or trying to get pregnant Breast-feeding How should I use this medication? This medication is injected into a vein. It is given by your care team in a hospital or clinic setting. Talk to your  care team about the use of this medication in children. Special care may be needed. Overdosage: If you think you have taken too much of this medicine contact a poison control center or emergency room at once. NOTE: This medicine is only for you. Do not share this medicine with others. What if I miss a dose? Keep appointments for follow-up doses. It is important not to miss your dose. Call your care team if you are unable to keep an appointment. What may interact with this medication? Other medications may affect the way this medication works. Talk with your care team about all of the medications you take. They may suggest changes to your treatment plan to lower the risk of side effects and to make sure your medications work as intended. This list may not describe all possible interactions. Give your health care provider a list of all the medicines, herbs, non-prescription drugs, or dietary supplements you use. Also tell them if you smoke, drink alcohol, or use illegal drugs. Some items may interact with your medicine. What should I watch for while using this medication? Your condition will be monitored carefully while you are receiving this medication. You may need blood work while taking this medication. This medication may make you feel generally unwell. This is not uncommon as chemotherapy can affect healthy cells as well as cancer cells. Report any side effects. Continue your course of treatment even though you feel ill unless your care team tells you to stop. This medication can cause serious allergic reactions. To reduce the risk, your care team may give you other medications to take before receiving this one. Be sure to follow the directions from your care team. This medication may increase your risk of getting an infection. Call your care team for advice if you get a fever, chills, sore throat, or other symptoms of a cold or flu. Do not treat yourself. Try to avoid being around people who are  sick. This medication may increase your risk to bruise or bleed. Call your care team if you notice any unusual bleeding. Be careful brushing or flossing your teeth or using a toothpick because you may get an infection or bleed more easily. If you have any dental work done, tell your dentist you are receiving this medication. Talk to your care team if you or your partner may be pregnant. Serious birth defects can occur if you take this medication during pregnancy and for 6 months after the last dose. You will need a negative pregnancy test before starting this medication. Contraception is recommended while taking this medication and for 6 months after the last dose. Your care team can help you find the option that works for you. If your partner can get pregnant, use a condom during sex while taking this medication and for 3 months after the last dose. Do not breastfeed while taking this medication and for 2 weeks after the last dose. This medication may cause infertility. Talk to your care team if you are concerned about your fertility. What side effects may I notice from receiving this medication? Side effects that you should report to your care team as soon as possible: Allergic reactions--skin rash, itching, hives, swelling of the face, lips, tongue, or   throat Dry cough, shortness of breath or trouble breathing Infection--fever, chills, cough, sore throat, wounds that don't heal, pain or trouble when passing urine, general feeling of discomfort or being unwell Low red blood cell level--unusual weakness or fatigue, dizziness, headache, trouble breathing Pain, tingling, or numbness in the hands or feet Stomach pain, unusual weakness or fatigue, nausea, vomiting, diarrhea, or fever that lasts longer than expected Unusual bruising or bleeding Side effects that usually do not require medical attention (report to your care team if they continue or are bothersome): Diarrhea Fatigue Hair loss Loss of  appetite Nausea Vomiting This list may not describe all possible side effects. Call your doctor for medical advice about side effects. You may report side effects to FDA at 1-800-FDA-1088. Where should I keep my medication? This medication is given in a hospital or clinic. It will not be stored at home. NOTE: This sheet is a summary. It may not cover all possible information. If you have questions about this medicine, talk to your doctor, pharmacist, or health care provider.  2023 Elsevier/Gold Standard (2007-03-21 00:00:00)   Gemcitabine Injection What is this medication? GEMCITABINE (jem SYE ta been) treats some types of cancer. It works by slowing down the growth of cancer cells. This medicine may be used for other purposes; ask your health care provider or pharmacist if you have questions. COMMON BRAND NAME(S): Gemzar, Infugem What should I tell my care team before I take this medication? They need to know if you have any of these conditions: Blood disorders Infection Kidney disease Liver disease Lung or breathing disease, such as asthma or COPD Recent or ongoing radiation therapy An unusual or allergic reaction to gemcitabine, other medications, foods, dyes, or preservatives If you or your partner are pregnant or trying to get pregnant Breast-feeding How should I use this medication? This medication is injected into a vein. It is given by your care team in a hospital or clinic setting. Talk to your care team about the use of this medication in children. Special care may be needed. Overdosage: If you think you have taken too much of this medicine contact a poison control center or emergency room at once. NOTE: This medicine is only for you. Do not share this medicine with others. What if I miss a dose? Keep appointments for follow-up doses. It is important not to miss your dose. Call your care team if you are unable to keep an appointment. What may interact with this  medication? Interactions have not been studied. This list may not describe all possible interactions. Give your health care provider a list of all the medicines, herbs, non-prescription drugs, or dietary supplements you use. Also tell them if you smoke, drink alcohol, or use illegal drugs. Some items may interact with your medicine. What should I watch for while using this medication? Your condition will be monitored carefully while you are receiving this medication. This medication may make you feel generally unwell. This is not uncommon, as chemotherapy can affect healthy cells as well as cancer cells. Report any side effects. Continue your course of treatment even though you feel ill unless your care team tells you to stop. In some cases, you may be given additional medications to help with side effects. Follow all directions for their use. This medication may increase your risk of getting an infection. Call your care team for advice if you get a fever, chills, sore throat, or other symptoms of a cold or flu. Do not treat yourself. Try   to avoid being around people who are sick. This medication may increase your risk to bruise or bleed. Call your care team if you notice any unusual bleeding. Be careful brushing or flossing your teeth or using a toothpick because you may get an infection or bleed more easily. If you have any dental work done, tell your dentist you are receiving this medication. Avoid taking medications that contain aspirin, acetaminophen, ibuprofen, naproxen, or ketoprofen unless instructed by your care team. These medications may hide a fever. Talk to your care team if you or your partner wish to become pregnant or think you might be pregnant. This medication can cause serious birth defects if taken during pregnancy and for 6 months after the last dose. A negative pregnancy test is required before starting this medication. A reliable form of contraception is recommended while taking  this medication and for 6 months after the last dose. Talk to your care team about effective forms of contraception. Do not father a child while taking this medication and for 3 months after the last dose. Use a condom while having sex during this time period. Do not breastfeed while taking this medication and for at least 1 week after the last dose. This medication may cause infertility. Talk to your care team if you are concerned about your fertility. What side effects may I notice from receiving this medication? Side effects that you should report to your care team as soon as possible: Allergic reactions--skin rash, itching, hives, swelling of the face, lips, tongue, or throat Capillary leak syndrome--stomach or muscle pain, unusual weakness or fatigue, feeling faint or lightheaded, decrease in the amount of urine, swelling of the ankles, hands, or feet, trouble breathing Infection--fever, chills, cough, sore throat, wounds that don't heal, pain or trouble when passing urine, general feeling of discomfort or being unwell Liver injury--right upper belly pain, loss of appetite, nausea, light-colored stool, dark yellow or brown urine, yellowing skin or eyes, unusual weakness or fatigue Low red blood cell level--unusual weakness or fatigue, dizziness, headache, trouble breathing Lung injury--shortness of breath or trouble breathing, cough, spitting up blood, chest pain, fever Stomach pain, bloody diarrhea, pale skin, unusual weakness or fatigue, decrease in the amount of urine, which may be signs of hemolytic uremic syndrome Sudden and severe headache, confusion, change in vision, seizures, which may be signs of posterior reversible encephalopathy syndrome (PRES) Unusual bruising or bleeding Side effects that usually do not require medical attention (report to your care team if they continue or are bothersome): Diarrhea Drowsiness Hair loss Nausea Pain, redness, or swelling with sores inside the  mouth or throat Vomiting This list may not describe all possible side effects. Call your doctor for medical advice about side effects. You may report side effects to FDA at 1-800-FDA-1088. Where should I keep my medication? This medication is given in a hospital or clinic. It will not be stored at home. NOTE: This sheet is a summary. It may not cover all possible information. If you have questions about this medicine, talk to your doctor, pharmacist, or health care provider.  2023 Elsevier/Gold Standard (2007-03-21 00:00:00)  

## 2022-03-20 NOTE — ED Notes (Signed)
Patient transported to CT 

## 2022-03-20 NOTE — ED Notes (Signed)
Family reports patient is uncomfortable/pain, provider notified

## 2022-03-20 NOTE — Progress Notes (Signed)
Patient seen by Lisa Thomas NP today  Vitals are within treatment parameters.  Labs reviewed by Lisa Thomas NP and are within treatment parameters.  Per physician team, patient is ready for treatment and there are NO modifications to the treatment plan.     

## 2022-03-20 NOTE — ED Triage Notes (Signed)
  Patient comes in with lower abdominal and lower back pain.  Patient has hx of stage 4 pancreatic cancer and received infusion earlier today.  Patient states she has not had bowel movement in about 9 days and is concerned for possible obstruction.  Pain 9/10, tender/constant.

## 2022-03-21 ENCOUNTER — Other Ambulatory Visit: Payer: Self-pay

## 2022-03-21 DIAGNOSIS — F32A Depression, unspecified: Secondary | ICD-10-CM | POA: Diagnosis present

## 2022-03-21 DIAGNOSIS — C259 Malignant neoplasm of pancreas, unspecified: Secondary | ICD-10-CM | POA: Diagnosis present

## 2022-03-21 DIAGNOSIS — K219 Gastro-esophageal reflux disease without esophagitis: Secondary | ICD-10-CM | POA: Diagnosis present

## 2022-03-21 DIAGNOSIS — T402X5A Adverse effect of other opioids, initial encounter: Secondary | ICD-10-CM | POA: Diagnosis present

## 2022-03-21 DIAGNOSIS — R1084 Generalized abdominal pain: Secondary | ICD-10-CM | POA: Diagnosis present

## 2022-03-21 DIAGNOSIS — Z803 Family history of malignant neoplasm of breast: Secondary | ICD-10-CM | POA: Diagnosis not present

## 2022-03-21 DIAGNOSIS — G893 Neoplasm related pain (acute) (chronic): Secondary | ICD-10-CM | POA: Diagnosis present

## 2022-03-21 DIAGNOSIS — E43 Unspecified severe protein-calorie malnutrition: Secondary | ICD-10-CM | POA: Diagnosis present

## 2022-03-21 DIAGNOSIS — T451X5A Adverse effect of antineoplastic and immunosuppressive drugs, initial encounter: Secondary | ICD-10-CM | POA: Diagnosis present

## 2022-03-21 DIAGNOSIS — Z79891 Long term (current) use of opiate analgesic: Secondary | ICD-10-CM | POA: Diagnosis not present

## 2022-03-21 DIAGNOSIS — K5900 Constipation, unspecified: Secondary | ICD-10-CM | POA: Diagnosis not present

## 2022-03-21 DIAGNOSIS — F419 Anxiety disorder, unspecified: Secondary | ICD-10-CM | POA: Diagnosis present

## 2022-03-21 DIAGNOSIS — Z79899 Other long term (current) drug therapy: Secondary | ICD-10-CM | POA: Diagnosis not present

## 2022-03-21 DIAGNOSIS — Z8249 Family history of ischemic heart disease and other diseases of the circulatory system: Secondary | ICD-10-CM | POA: Diagnosis not present

## 2022-03-21 DIAGNOSIS — R739 Hyperglycemia, unspecified: Secondary | ICD-10-CM | POA: Diagnosis present

## 2022-03-21 DIAGNOSIS — C251 Malignant neoplasm of body of pancreas: Secondary | ICD-10-CM | POA: Diagnosis not present

## 2022-03-21 DIAGNOSIS — R109 Unspecified abdominal pain: Secondary | ICD-10-CM | POA: Diagnosis present

## 2022-03-21 DIAGNOSIS — G620 Drug-induced polyneuropathy: Secondary | ICD-10-CM | POA: Diagnosis present

## 2022-03-21 DIAGNOSIS — Z823 Family history of stroke: Secondary | ICD-10-CM | POA: Diagnosis not present

## 2022-03-21 DIAGNOSIS — D539 Nutritional anemia, unspecified: Secondary | ICD-10-CM

## 2022-03-21 DIAGNOSIS — Z818 Family history of other mental and behavioral disorders: Secondary | ICD-10-CM | POA: Diagnosis not present

## 2022-03-21 DIAGNOSIS — K5903 Drug induced constipation: Secondary | ICD-10-CM | POA: Diagnosis present

## 2022-03-21 DIAGNOSIS — Z681 Body mass index (BMI) 19 or less, adult: Secondary | ICD-10-CM | POA: Diagnosis not present

## 2022-03-21 LAB — BASIC METABOLIC PANEL
Anion gap: 7 (ref 5–15)
BUN: 12 mg/dL (ref 6–20)
CO2: 25 mmol/L (ref 22–32)
Calcium: 8.7 mg/dL — ABNORMAL LOW (ref 8.9–10.3)
Chloride: 104 mmol/L (ref 98–111)
Creatinine, Ser: 0.42 mg/dL — ABNORMAL LOW (ref 0.44–1.00)
GFR, Estimated: >60 mL/min (ref >60)
Glucose, Bld: 123 mg/dL — ABNORMAL HIGH (ref 70–99)
Potassium: 3.7 mmol/L (ref 3.5–5.1)
Sodium: 136 mmol/L (ref 135–145)

## 2022-03-21 LAB — CBC
HCT: 33.8 % — ABNORMAL LOW (ref 36.0–46.0)
Hemoglobin: 10.9 g/dL — ABNORMAL LOW (ref 12.0–15.0)
MCH: 32.8 pg (ref 26.0–34.0)
MCHC: 32.2 g/dL (ref 30.0–36.0)
MCV: 101.8 fL — ABNORMAL HIGH (ref 80.0–100.0)
Platelets: 135 10*3/uL — ABNORMAL LOW (ref 150–400)
RBC: 3.32 MIL/uL — ABNORMAL LOW (ref 3.87–5.11)
RDW: 14.6 % (ref 11.5–15.5)
WBC: 5.7 10*3/uL (ref 4.0–10.5)
nRBC: 0 % (ref 0.0–0.2)

## 2022-03-21 LAB — PHOSPHORUS: Phosphorus: 3.9 mg/dL (ref 2.5–4.6)

## 2022-03-21 LAB — MAGNESIUM: Magnesium: 2.2 mg/dL (ref 1.7–2.4)

## 2022-03-21 MED ORDER — OXYCODONE HCL 5 MG PO TABS: 5.0000 mg | ORAL_TABLET | ORAL | Status: DC | PRN

## 2022-03-21 MED ORDER — ENOXAPARIN SODIUM 40 MG/0.4ML IJ SOSY
40.0000 mg | PREFILLED_SYRINGE | INTRAMUSCULAR | Status: DC
  Administered 2022-03-21 – 2022-03-22 (×2): 40 mg via SUBCUTANEOUS
  Filled 2022-03-21 (×2): qty 0.4

## 2022-03-21 MED ORDER — LIDOCAINE-PRILOCAINE 2.5-2.5 % EX CREA: 1.0000 | TOPICAL_CREAM | CUTANEOUS | Status: DC | PRN

## 2022-03-21 MED ORDER — PANTOPRAZOLE SODIUM 40 MG PO TBEC
40.0000 mg | DELAYED_RELEASE_TABLET | Freq: Every day | ORAL | Status: DC
  Administered 2022-03-21 – 2022-03-22 (×2): 40 mg via ORAL
  Filled 2022-03-21 (×2): qty 1

## 2022-03-21 MED ORDER — SERTRALINE HCL 25 MG PO TABS
25.0000 mg | ORAL_TABLET | Freq: Every day | ORAL | Status: DC
  Administered 2022-03-21 – 2022-03-22 (×2): 25 mg via ORAL
  Filled 2022-03-21 (×2): qty 1

## 2022-03-21 MED ORDER — SENNOSIDES-DOCUSATE SODIUM 8.6-50 MG PO TABS
2.0000 | ORAL_TABLET | Freq: Two times a day (BID) | ORAL | Status: DC
  Administered 2022-03-21 – 2022-03-22 (×3): 2 via ORAL
  Filled 2022-03-21 (×3): qty 2

## 2022-03-21 MED ORDER — SORBITOL 70 % SOLN
300.0000 mL | TOPICAL_OIL | Freq: Once | ORAL | Status: AC
  Administered 2022-03-21: 300 mL via RECTAL
  Filled 2022-03-21: qty 90

## 2022-03-21 MED ORDER — HYDROMORPHONE HCL 1 MG/ML IJ SOLN
1.0000 mg | Freq: Once | INTRAMUSCULAR | Status: AC
  Administered 2022-03-21: 1 mg via INTRAVENOUS
  Filled 2022-03-21: qty 1

## 2022-03-21 MED ORDER — HYDROMORPHONE HCL 1 MG/ML IJ SOLN: 1.0000 mg | INTRAMUSCULAR | Status: DC | PRN

## 2022-03-21 MED ORDER — POLYETHYLENE GLYCOL 3350 17 G PO PACK: 17.0000 g | PACK | Freq: Every day | ORAL | Status: DC | PRN

## 2022-03-21 MED ORDER — GABAPENTIN 100 MG PO CAPS
100.0000 mg | ORAL_CAPSULE | Freq: Three times a day (TID) | ORAL | Status: DC
  Administered 2022-03-21 – 2022-03-22 (×4): 100 mg via ORAL
  Filled 2022-03-21 (×4): qty 1

## 2022-03-21 MED ORDER — FAMOTIDINE 20 MG PO TABS
40.0000 mg | ORAL_TABLET | Freq: Two times a day (BID) | ORAL | Status: DC
  Administered 2022-03-21 – 2022-03-22 (×3): 40 mg via ORAL
  Filled 2022-03-21 (×3): qty 2

## 2022-03-21 MED ORDER — LACTATED RINGERS IV SOLN: INTRAVENOUS | Status: DC

## 2022-03-21 MED ORDER — PROCHLORPERAZINE EDISYLATE 10 MG/2ML IJ SOLN
5.0000 mg | Freq: Four times a day (QID) | INTRAMUSCULAR | Status: DC | PRN
  Administered 2022-03-21: 5 mg via INTRAVENOUS
  Filled 2022-03-21: qty 2

## 2022-03-21 MED ORDER — PEG 3350-KCL-NA BICARB-NACL 420 G PO SOLR
4000.0000 mL | Freq: Once | ORAL | Status: AC
  Administered 2022-03-21: 4000 mL via ORAL

## 2022-03-21 MED ORDER — LACTATED RINGERS IV SOLN: INTRAVENOUS | Status: AC

## 2022-03-21 MED ORDER — POLYETHYLENE GLYCOL 3350 17 G PO PACK
51.0000 g | PACK | Freq: Once | ORAL | Status: AC
  Administered 2022-03-21: 51 g via ORAL
  Filled 2022-03-21: qty 3

## 2022-03-21 MED ORDER — ONDANSETRON HCL 4 MG/2ML IJ SOLN
4.0000 mg | Freq: Four times a day (QID) | INTRAMUSCULAR | Status: DC | PRN
  Administered 2022-03-21: 4 mg via INTRAVENOUS
  Filled 2022-03-21 (×3): qty 2

## 2022-03-21 MED ORDER — ONDANSETRON HCL 4 MG/2ML IJ SOLN
4.0000 mg | Freq: Once | INTRAMUSCULAR | Status: AC
  Administered 2022-03-21: 4 mg via INTRAVENOUS

## 2022-03-21 MED ORDER — POLYETHYLENE GLYCOL 3350 17 G PO PACK
17.0000 g | PACK | Freq: Every day | ORAL | Status: DC
  Administered 2022-03-21 – 2022-03-22 (×2): 17 g via ORAL
  Filled 2022-03-21 (×2): qty 1

## 2022-03-21 MED ORDER — HYDROMORPHONE HCL 1 MG/ML IJ SOLN
0.5000 mg | INTRAMUSCULAR | Status: DC | PRN
  Administered 2022-03-21 – 2022-03-22 (×2): 0.5 mg via INTRAVENOUS
  Filled 2022-03-21 (×2): qty 0.5

## 2022-03-21 MED ORDER — BISACODYL 10 MG RE SUPP: 10.0000 mg | Freq: Four times a day (QID) | RECTAL | Status: DC

## 2022-03-21 MED ORDER — LACTULOSE 10 GM/15ML PO SOLN: 20.0000 g | Freq: Two times a day (BID) | ORAL | Status: DC

## 2022-03-21 NOTE — Progress Notes (Signed)
Care started prior to midnight in the emergency room and patient was admitted early this morning by my partner and colleague Dr. Irene Pap and I am in current agreement with her assessment and plan with additional changes to plan of care being made accordingly.  Patient is a 57 year old unfortunate Caucasian female with past medical history significant for but not limited to pancreatic cancer, chemotherapy-induced neuropathy, GERD, chronic anxiety depression as well as other comorbidities who initially presented to the [facility] emergency room due to worsening abdominal pain and constipation for 9 days as well as nausea and vomiting.  She received her second cycle of chemotherapy yesterday and yesterday was the first time that she vomited since her diagnosis of pancreatic cancer on Jul 11, 2021.  In the ED she received multiple rounds of IV Dilaudid and a subsequent enema and still had no bowel movement.  Due to her persistent symptomology the EDP requested admission and he was transferred to the [facility] for further evaluation.  In the ED she underwent imaging and had a CT scan of the abdomen pelvis which showed a slight interval increase in the size of the hypodense mass involving the pancreatic neck and body which is now measuring up to 5 cm as well as extension of the mass towards the posterior gastric antrum with moderate fluid distention of the stomach and mild gastric antral wall thickening with potential gastric involvement by the tumor.  He was also noted to have a large burden of stool suggesting constipation but no evidence of bowel obstruction.  I have notified her primary oncologist Dr. Illene Regulus will see her in the a.m. but recommends pain control and working on improving her constipation.  Currently she is being admitted and treated for the following but not limited to:  Abdominal pain in the setting of pancreatic cancer and constipation x 9 days -No bowel obstruction on CT scan,  showing interval increase in size of hypodense mass involving the pancreatic neck and body now measuring up to 5 cm.  Extension of mass towards the posterior gastric antrum with moderate fluid distention of the stomach and mild gastric antral wall thickening, potential gastric involvement by tumor. -C/w As needed analgesics along with bowel regimen -IV antiemetics as needed Smog enema x 1 -Gentle IV fluid hydration -Oncology has been consulted for further evaluation recommendations and will see the patient tomorrow   Pancreatic cancer on chemotherapy -Received second cycle of gemcitabine/Abraxane yesterday -Pateint's CA 19-9 went from 734 -> 975 -Followed by medical oncology Dr.Sherrill -Dr. Benay Spice notified and will see the patient tomorrow   GERD/GI Prophylaxis  -Resume home PPI with Pantoprazole 40 mg po daily and H2 blocker with Famotidine 40 mg po BID   Chemotherapy-induced neuropathy -Resume home regimen with Gabapentin 100 mg po TID   Chronic Anxiety/Depression -C/w Sertraline 25 mg po Daily    Severe Protein Calorie Malnutrition Estimated body mass index is 15.07 kg/m as calculated from the following:   Height as of this encounter: '5\' 10"'$  (1.778 m).   Weight as of this encounter: 47.6 kg. -Dietitian consult  Macrocytic Anemia -Hgb/Hct Trend: Recent Labs  Lab 03/04/22 1012 03/11/22 0854 03/20/22 0826 03/21/22 0619  HGB 10.2* 9.7* 10.6* 10.9*  HCT 32.0* 30.1* 32.8* 33.8*  MCV 101.6* 103.4* 100.6* 101.8*  -Check Anemia Panel in the AM -Continue to Monitor for S/Sx of Bleeding; No overt bleeding noted -Repeat CBC in the AM   Hyperglycemia -Blood Sugars ranging from 85-123 -Check HbA1c in the  AM  -Continue to monitor blood sugars per protocol and if necessary will add sensitive NovoLog sliding scale insulin AC  Will continue to monitor the patient's clinical response to intervention and repeat blood work in the a.m. and follow-up on medical oncology evaluation  recommendations

## 2022-03-21 NOTE — ED Provider Notes (Signed)
  Provider Note MRN:  [id number]  Arrival date & time: 03/21/22    ED Course and Medical Decision Making  Assumed care from PA Smoot at shift change.  Recent diagnosis of pancreatic cancer on narcotics, here with abdominal pain, constipation, CT without acute process.  Providing symptomatic management, enema, will reassess.  2 AM update: Persistent abdominal and back pain, unsuccessful enema.  CT scan is showing that this tumor is larger than on previous scan and is now abutting the stomach and the stomach is distended.  This raises concern for early gastric outlet obstruction and potentially will not allow her to aggressively treat her constipation at home with oral agents.  We discussed disposition options and patient is more comfortable with admission.  Excepted for admission by hospitalist team at [facility].  Procedures  Final Clinical Impressions(s) / ED Diagnoses     ICD-10-CM   1. Generalized abdominal pain  R10.84     2. Drug-induced constipation  K59.03       ED Discharge Orders     None       Discharge Instructions   None     Barth Kirks. Sedonia Small, [place] [email or web address]    Maudie Flakes, MD 03/21/22 [phone]

## 2022-03-21 NOTE — ED Notes (Signed)
Soap suds enema administered and held by patient for 15 minutes. Patient helped to bedside commode.

## 2022-03-21 NOTE — ED Notes (Signed)
Pt reports feeling nauseous upon transfer to EMS stretcher. Dr. Sedonia Small notified and verbal order received. See MAR.

## 2022-03-21 NOTE — Progress Notes (Signed)
Initial Nutrition Assessment  DOCUMENTATION CODES:   Severe malnutrition in context of acute illness/injury  INTERVENTION:   -Pt to have family bring in preferred protein shake she buys at [facility]. -Provided handout on constipation management  Once pt has BM: -Encourage PO intakes and supplements TID -Add Multivitamin with minerals daily  NUTRITION DIAGNOSIS:   Severe Malnutrition related to acute illness (constipation) as evidenced by moderate fat depletion, energy intake < or equal to 50% for > or equal to 5 days.  GOAL:   Patient will meet greater than or equal to 90% of their needs  MONITOR:   PO intake, Supplement acceptance, Labs, Weight trends, I & O's  REASON FOR ASSESSMENT:   Consult Assessment of nutrition requirement/status  ASSESSMENT:   57 y.o. female with medical history significant for pancreatic cancer, chemotherapy-induced neuropathy, GERD, chronic anxiety/depression, who initially presented to [facility]pp[facility] ED due to worsening abdominal pain, constipation x 9 days, nausea and vomiting.  States she started a new chemotherapy yesterday.  Tara Jensen was the first time she vomited since her diagnosis of pancreatic cancer on Jul 11, 2021.  Patient states she feels bad. Had SMOG enema but it didn't work. States yesterday she had a tap water enema with no results. Has not had a BM since 1/30. Pt states she has a history of constipation but was concerned that starting narcotics would make it worse. Pt at a loss at what to do. States she had so much pain it was hard to breathe.  Reviewed dietary strategies to help with constipation management going forward. Encouraged fluid intakes and to sip on protein shakes. Primary concern is for pt to have a BM and then can address pt's malnutrition.  Pt followed by [facility]. Last seen 09/19/21. At that time pt was eating better with improved appetite. Weight had started to increase. Pt weighed ~109 lbs then.  Per weight records,  pt has lost 5 lbs since 12/27/21 (4.5% wt loss x 2.5 months, insignificant for time frame).  Medications: Pepcid, Miralax, Senokot  Labs reviewed.  NUTRITION - FOCUSED PHYSICAL EXAM:  Partial completed, wearing outside clothes. Restless and uncomfortable. Flowsheet Row Most Recent Value  Orbital Region No depletion  Upper Arm Region Severe depletion  Thoracic and Lumbar Region Unable to assess  Buccal Region Mild depletion  Temple Region Mild depletion  Clavicle Bone Region Unable to assess  Clavicle and Acromion Bone Region Unable to assess  Scapular Bone Region Unable to assess  Dorsal Hand Unable to assess  Patellar Region Unable to assess  Anterior Thigh Region Unable to assess  Posterior Calf Region Unable to assess  Edema (RD Assessment) None  Hair Reviewed  Tara Jensen hat]  Eyes Reviewed  Mouth Reviewed  Skin Reviewed       Diet Order:   Diet Order             Diet regular Room service appropriate? Yes; Fluid consistency: Thin  Diet effective now                   EDUCATION NEEDS:   Education needs have been addressed  Skin:  Skin Assessment: Reviewed RN Assessment  Last BM:  1/30  Height:   Ht Readings from Last 1 Encounters:  03/20/22 '5\' 10"'$  (1.778 m)    Weight:   Wt Readings from Last 1 Encounters:  03/20/22 47.6 kg    BMI:  Body mass index is 15.07 kg/m.  Estimated Nutritional Needs:   Kcal:  2050-2250  Protein:  95-105g  Fluid:  2.1L/day  Tara Bibles, MS, RD, LDN Inpatient Clinical Dietitian Contact information available via Amion

## 2022-03-21 NOTE — ED Notes (Signed)
Pt ambulated to restroom with standby assist. Respirations are equal and nonlabored. Skin warm and dry.

## 2022-03-21 NOTE — ED Notes (Signed)
Patient ambulating to restroom, feels urge to have a BM

## 2022-03-21 NOTE — Progress Notes (Addendum)
This is a unfortunate 57 year old female with PMHx significant for pancreatic cancer, dx 07/2021.   6/5 an EUS done by Dr. Rush Landmark, showed a uT4N0 mass in the neck of the pancreas, abutting and invading the SMA and celiac trunk. Since, she has been undergoing palliative chemotherapy with FOLFIRINOX.  She at baseline she's had chronic abdominal and back pain typically controlled tramadol and Tylenol.  Few weeks ago her pain began increasing and becoming intolerable.  Her oncologist started her on morphine.  Subsequently, bowel movements became more difficult.  More recently she has developed nausea and vomiting.  She went to [facility].  CT abdominal pelvis done in the ER,shows interval increase in size of hypodense mass involving the pancreatic neck and body, now measuring up to 5 cm. Extension of mass towards the posterior gastric antrum with moderate fluid distension of the stomach and mild gastric antral wall thickening, potential gastric involvement by tumor.  She also has large stool burden consistent with significant constipation. She has been treated with Dilaudid 1 mg x 3, Zofran IV fluid.  Patient's pain is unrelieved.  Admission requested for pain control, constipation, and possible developing gastric outlet obstruction,

## 2022-03-21 NOTE — H&P (Addendum)
History and Physical  Tara Jensen GLO:[id number] DOB: 1966-01-10 DOA: 03/20/2022  Referring physician: Accepted by Dr. Claria Dice [facility], hospitalist service. PCP: Chesley Noon, MD  Outpatient Specialists: Medical oncology, Dr. Benay Spice. Patient coming from: Home through [facility] ED.  Chief Complaint: Abdominal pain, constipation  HPI: Tara Jensen is a 57 y.o. female with medical history significant for pancreatic cancer, chemotherapy-induced neuropathy, GERD, chronic anxiety/depression, who initially presented to [facility] due to worsening abdominal pain, constipation x 9 days, nausea and vomiting.  States she started a new chemotherapy yesterday.  Wilburn Mylar was the first time she vomited since her diagnosis of pancreatic cancer on Jul 11, 2021.  No reported subjective fevers or chills.  In the ED she received multiple rounds of IV Dilaudid and a subsequent enema.  Still no bowel movements.  Due to her persistent symptomatology EDP requested admission.  The patient was admitted by Dr. Claria Dice, [facility] and transferred to [place] unit as observation status.  Seen and examined at her bedside.  Abdominal pain is 6 out of 10.  No nausea at the time of this exam.  Bowel sounds present on exam.  No bowel movement as of yet.  Bowel regimen added along with smog enema.  No reported cardiopulmonary symptoms.  ED Course: Tmax 98.5.  BP 123/75, pulse 78, respiratory 16, O2 saturation 100% on room air.  Lab studies remarkable for serum glucose 122, BUN 21.  Hemoglobin 10.6, MCV 100.6.  Review of Systems: Review of systems as noted in the HPI. All other systems reviewed and are negative.   Past Medical History:  Diagnosis Date   Abnormal Pap smear    Anxiety    "situational"   BV (bacterial vaginosis)    Cancer (HCC)    pancreatic   Cervical polyp    Depression    "situational"   Family history of adverse reaction to anesthesia    mother had N/V   Recurrent UTI  (urinary tract infection)    Past Surgical History:  Procedure Laterality Date   BIOPSY  07/16/2021   Procedure: BIOPSY;  Surgeon: Rush Landmark, Telford Nab., MD;  Location: [facility] ENDOSCOPY;  Service: Gastroenterology;;   CERVICAL CONE BIOPSY  1996   ESOPHAGOGASTRODUODENOSCOPY (EGD) WITH PROPOFOL N/A 07/16/2021   Procedure: ESOPHAGOGASTRODUODENOSCOPY (EGD) WITH PROPOFOL;  Surgeon: Irving Copas., MD;  Location: [place];  Service: Gastroenterology;  Laterality: N/A;   FINE NEEDLE ASPIRATION  07/16/2021   Procedure: FINE NEEDLE ASPIRATION (FNA) LINEAR;  Surgeon: Irving Copas., MD;  Location: [place];  Service: Gastroenterology;;   IR RADIOLOGIST EVAL & MGMT  02/26/2022   LASIK     PORTACATH PLACEMENT Right 08/01/2021   Procedure: INSERTION PORT-A-CATH;  Surgeon: Dwan Bolt, MD;  Location: [place];  Service: General;  Laterality: Right;   UPPER ESOPHAGEAL ENDOSCOPIC ULTRASOUND (EUS) N/A 07/16/2021   Procedure: UPPER ESOPHAGEAL ENDOSCOPIC ULTRASOUND (EUS);  Surgeon: Irving Copas., MD;  Location: [place];  Service: Gastroenterology;  Laterality: N/A;    Social History:  reports that she has never smoked. She has never used smokeless tobacco. She reports that she does not currently use alcohol. She reports that she does not use drugs.   No Known Allergies  Family History  Problem Relation Age of Onset   Depression Mother    Depression Brother    Stroke Maternal Grandmother    Hypertension Maternal Grandfather    Breast cancer Cousin 29       maternal first cousin  Colon cancer Neg Hx    Esophageal cancer Neg Hx    Rectal cancer Neg Hx    Stomach cancer Neg Hx       Prior to Admission medications   Medication Sig Start Date End Date Taking? Authorizing Provider  acetaminophen (TYLENOL) 500 MG tablet Take 1,000 mg by mouth every 6 (six) hours as needed for moderate pain or mild pain.    [provider]  BIOTIN PO Take 2,500 mcg by mouth  daily.    [provider]  dexamethasone (DECADRON) 4 MG tablet Take 1 tablet (4 mg total) by mouth 2 (two) times daily with a meal. Take twice daily x 2 days after each chemotherapy treatment beginning day 2 of each cycle Patient not taking: Reported on 02/25/2022 02/08/22   Owens Shark, NP  diclofenac Sodium (VOLTAREN) 1 % GEL Apply 2 g topically 4 (four) times daily as needed (back pain.). 09/05/21   Ladell Pier, MD  diphenhydrAMINE (BENADRYL) 25 mg capsule Take 25 mg by mouth at bedtime. Patient not taking: Reported on 02/06/2022    [provider]  diphenhydramine-acetaminophen (TYLENOL PM) 25-500 MG TABS tablet Take 1 tablet by mouth at bedtime as needed. Patient not taking: Reported on 03/20/2022    [provider]  famotidine (PEPCID) 40 MG tablet Take 40 mg by mouth 2 (two) times daily. 07/24/21   [provider]  gabapentin (NEURONTIN) 300 MG capsule Take 1 capsule (300 mg total) by mouth 3 (three) times daily. 03/12/22   Owens Shark, NP  lidocaine-prilocaine (EMLA) cream Apply 1 Application topically as needed. 02/25/22   Ladell Pier, MD  loratadine (CLARITIN) 10 MG tablet Take 10 mg by mouth daily. X 7 days after tx Patient not taking: Reported on 02/25/2022    [provider]  magic mouthwash SOLN Take 5 mLs by mouth 4 (four) times daily. Patient not taking: Reported on 08/22/2021 08/15/21   Owens Shark, NP  morphine (MS CONTIN) 15 MG 12 hr tablet Take 1 tablet (15 mg total) by mouth every 12 (twelve) hours. Patient taking differently: Take 15 mg by mouth every 12 (twelve) hours. Two tablet in the am/pm 03/13/22   Owens Shark, NP  morphine (MSIR) 15 MG tablet Take 1-2 tablets (15-30 mg total) by mouth every 4 (four) hours as needed for severe pain. Do not drive while taking 06/11/00   Owens Shark, NP  ondansetron (ZOFRAN) 8 MG tablet Take 1 tablet (8 mg total) by mouth every 8 (eight) hours as needed for nausea or vomiting  (Starting day 4 after chemo as needed for nausea and vomiting). Patient not taking: Reported on 02/06/2022 12/28/21   Ladell Pier, MD  pantoprazole (PROTONIX) 40 MG tablet Take 40 mg by mouth daily before breakfast. 07/11/21   [provider]  polyethylene glycol (MIRALAX / GLYCOLAX) 17 g packet Take 17 g by mouth daily as needed (constipation.).    [provider]  prochlorperazine (COMPAZINE) 5 MG tablet Take 1-2 tablets (5-10 mg total) by mouth every 6 (six) hours as needed for nausea or vomiting. 09/21/21   Owens Shark, NP  Sennosides-Docusate Sodium (SENNA PLUS) 8.6-50 MG CAPS Take 2 capsules by mouth daily as needed. Patient not taking: Reported on 03/06/2022    [provider]  sertraline (ZOLOFT) 25 MG tablet Take 1 tablet (25 mg total) by mouth daily. 02/08/22   Owens Shark, NP  traMADol (ULTRAM) 50 MG tablet Take  1-2 tablets (50-100 mg total) by mouth every 6 (six) hours as needed for moderate pain. Max dose 400 mg/day 03/12/22   Owens Shark, NP    Physical Exam: BP 123/75 (BP Location: Right Arm)   Pulse 70   Temp 98.3 F (36.8 C) (Oral)   Resp 16   Ht '5\' 10"'$  (1.778 m)   Wt 47.6 kg   LMP 11/01/2018   SpO2 100%   BMI 15.07 kg/m   General: 57 y.o. year-old female frail-appearing in no acute distress.  Alert and oriented x3. Cardiovascular: Regular rate and rhythm with no rubs or gallops.  No thyromegaly or JVD noted.  No lower extremity edema. 2/4 pulses in all 4 extremities. Respiratory: Clear to auscultation with no wheezes or rales. Good inspiratory effort. Abdomen: Soft tender diffusely.  Nondistended with normal bowel sounds x4 quadrants. Muskuloskeletal: No cyanosis, clubbing or edema noted bilaterally Neuro: CN II-XII intact, strength, sensation, reflexes Skin: No ulcerative lesions noted or rashes Psychiatry: Judgement and insight appear normal. Mood is appropriate for condition and setting          Labs on Admission:  Basic  Metabolic Panel: Recent Labs  Lab 03/20/22 0826  NA 137  K 4.0  CL 102  CO2 29  GLUCOSE 122*  BUN 21*  CREATININE 0.61  CALCIUM 9.6   Liver Function Tests: Recent Labs  Lab 03/20/22 0826  AST 16  ALT 27  ALKPHOS 62  BILITOT 0.4  PROT 7.5  ALBUMIN 4.5   No results for input(s): "LIPASE", "AMYLASE" in the last 168 hours. No results for input(s): "AMMONIA" in the last 168 hours. CBC: Recent Labs  Lab 03/20/22 0826  WBC 4.9  NEUTROABS 3.4  HGB 10.6*  HCT 32.8*  MCV 100.6*  PLT 186   Cardiac Enzymes: No results for input(s): "CKTOTAL", "CKMB", "CKMBINDEX", "TROPONINI" in the last 168 hours.  BNP (last 3 results) No results for input(s): "BNP" in the last 8760 hours.  ProBNP (last 3 results) No results for input(s): "PROBNP" in the last 8760 hours.  CBG: No results for input(s): "GLUCAP" in the last 168 hours.  Radiological Exams on Admission: CT ABDOMEN PELVIS W CONTRAST  Result Date: 03/20/2022 CLINICAL DATA:  Lower abdominal pain EXAM: CT ABDOMEN AND PELVIS WITH CONTRAST TECHNIQUE: Multidetector CT imaging of the abdomen and pelvis was performed using the standard protocol following bolus administration of intravenous contrast. RADIATION DOSE REDUCTION: This exam was performed according to the departmental dose-optimization program which includes automated exposure control, adjustment of the mA and/or kV according to patient size and/or use of iterative reconstruction technique. CONTRAST:  63m OMNIPAQUE IOHEXOL 300 MG/ML  SOLN COMPARISON:  CT 02/23/2022, 12/20/2021 FINDINGS: Lower chest: Lung bases demonstrate no acute airspace disease. Hepatobiliary: No focal liver abnormality is seen. No gallstones, gallbladder wall thickening, or biliary dilatation. Pancreas: Slight interval increase in size of hypodense mass involving the pancreatic neck and body, this measures 5 x 3.1 cm, previously 4 x 2.3 cm. Extension of mass towards the posterior gastric antrum, series 2,  image 24, potential gastric involvement. Spleen: Normal in size without focal abnormality. Adrenals/Urinary Tract: Adrenal glands are unremarkable. Kidneys are normal, without renal calculi, focal lesion, or hydronephrosis. Bladder is unremarkable. Stomach/Bowel: Moderate fluid distension of the stomach. Mild gastric antral wall thickening as seen on the previous exam. No dilated small bowel. Large stool burden Vascular/Lymphatic: Nonaneurysmal aorta. No suspicious lymph nodes. 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 Reproductive: Uterus and bilateral adnexa are unremarkable. Other: Negative for pelvic effusion or free air. Musculoskeletal: No acute or suspicious osseous abnormality. IMPRESSION: 1. Slight interval increase in size of hypodense mass involving the pancreatic neck and body, now measuring up to 5 cm. Extension of mass towards the posterior gastric antrum with moderate fluid distension of the stomach and mild gastric antral wall thickening, potential gastric involvement by tumor 2. Large stool burden suggesting constipation. There is no evidence for a bowel obstruction. Electronically Signed   By: Donavan Foil M.D.   On: 03/20/2022 23:49    EKG: I independently viewed the EKG done and my findings are as followed: None available at the time of this visit  Assessment/Plan Present on Admission:  Abdominal pain  Principal Problem:   Abdominal pain  Abdominal pain in the setting of pancreatic cancer and constipation x 9 days No bowel obstruction on CT scan, showing interval increase in size of hypodense mass involving the pancreatic neck and body now measuring up to 5 cm.  Extension of mass towards the posterior gastric antrum with moderate fluid distention of the stomach and mild gastric antral wall thickening, potential gastric involvement by tumor. As needed analgesics along with bowel regimen IV antiemetics as  needed Smog enema x 1 Gentle IV fluid hydration  Pancreatic cancer on chemotherapy Started new chemotherapy with FOLFIRINOX on 03/20/2022. Followed by medical oncology Dr.Sherrill Consult oncology in the morning  GERD Resume home PPI and H2 blocker  Chemotherapy-induced neuropathy Resume home regimen  Chronic anxiety/depression Resume home Zoloft  Severe protein calorie malnutrition Severe muscle mass loss BMI 15 Liberalize diet Dietitian consult    DVT prophylaxis: Subcu Lovenox daily  Code Status: Full code  Family Communication: None at bedside  Disposition Plan: Admitted to [place] unit  Consults called: None.  Admission status: Observation status   Status is: Observation    [name] MD [facility] Pager [phone]  If 7PM-7AM, please contact night-coverage [email or web address] Password [facility]  03/21/2022, 4:44 AM

## 2022-03-21 NOTE — ED Notes (Signed)
Leaving with [facility] via stretcher at this time.

## 2022-03-21 NOTE — Progress Notes (Signed)
MD [place] Crane was secured chat because patient is c/o nausea. PRN Zofran q6hrs was given at 0330.

## 2022-03-21 NOTE — ED Provider Notes (Signed)
Tara Jensen   CSN: [id number] Arrival date & time: 03/20/22  2106     History  Chief Complaint  Patient presents with   Abdominal Pain   Back Pain    Tara Jensen is a 57 y.o. female.  Patient with history of stage IV pancreatic cancer currently receiving palliative chemotherapy presents today with complaints of abdominal pain and constipation. She states that up until a few weeks ago she had been able to manage her pain with tramadol and tylenol/ibuprofen, however the pain has recently worsened and her oncologist prescribed her morphine. She states this has been controlling her pain, however she is now having significant difficulties with bowel movements. She states that it has been 9 days since she had any bowel movements at all. She is passing flatus. She does endorse 1 episode of nausea and vomiting earlier today, however she did have chemo this morning. She states that her pain has become too severe to manage and she is concerned for possible bowel obstruction. Pain is in her low abdomen and radiates to her back. She states that this pain is different than her normal cancer pain. She denies any history of abdominal surgeries.  The history is provided by the patient. No language interpreter was used.  Abdominal Pain Associated symptoms: constipation   Back Pain Associated symptoms: abdominal pain        Home Medications Prior to Admission medications   Medication Sig Start Date End Date Taking? Authorizing Provider  acetaminophen (TYLENOL) 500 MG tablet Take 1,000 mg by mouth every 6 (six) hours as needed for moderate pain or mild pain.    [provider]  BIOTIN PO Take 2,500 mcg by mouth daily.    [provider]  dexamethasone (DECADRON) 4 MG tablet Take 1 tablet (4 mg total) by mouth 2 (two) times daily with a meal. Take twice daily x 2 days after each chemotherapy treatment beginning day 2 of each  cycle Patient not taking: Reported on 02/25/2022 02/08/22   Owens Shark, NP  diclofenac Sodium (VOLTAREN) 1 % GEL Apply 2 g topically 4 (four) times daily as needed (back pain.). 09/05/21   Ladell Pier, MD  diphenhydrAMINE (BENADRYL) 25 mg capsule Take 25 mg by mouth at bedtime. Patient not taking: Reported on 02/06/2022    [provider]  diphenhydramine-acetaminophen (TYLENOL PM) 25-500 MG TABS tablet Take 1 tablet by mouth at bedtime as needed. Patient not taking: Reported on 03/20/2022    [provider]  famotidine (PEPCID) 40 MG tablet Take 40 mg by mouth 2 (two) times daily. 07/24/21   [provider]  gabapentin (NEURONTIN) 300 MG capsule Take 1 capsule (300 mg total) by mouth 3 (three) times daily. 03/12/22   Owens Shark, NP  lidocaine-prilocaine (EMLA) cream Apply 1 Application topically as needed. 02/25/22   Ladell Pier, MD  loratadine (CLARITIN) 10 MG tablet Take 10 mg by mouth daily. X 7 days after tx Patient not taking: Reported on 02/25/2022    [provider]  magic mouthwash SOLN Take 5 mLs by mouth 4 (four) times daily. Patient not taking: Reported on 08/22/2021 08/15/21   Owens Shark, NP  morphine (MS CONTIN) 15 MG 12 hr tablet Take 1 tablet (15 mg total) by mouth every 12 (twelve) hours. Patient taking differently: Take 15 mg by mouth every 12 (twelve) hours. Two tablet in the am/pm 03/13/22   Owens Shark, NP  morphine (MSIR) 15 MG tablet Take 1-2 tablets (15-30 mg total) by mouth every 4 (four) hours as needed for severe pain. Do not drive while taking 05/13/68   Owens Shark, NP  ondansetron (ZOFRAN) 8 MG tablet Take 1 tablet (8 mg total) by mouth every 8 (eight) hours as needed for nausea or vomiting (Starting day 4 after chemo as needed for nausea and vomiting). Patient not taking: Reported on 02/06/2022 12/28/21   Ladell Pier, MD  pantoprazole (PROTONIX) 40 MG tablet Take 40 mg by mouth daily before breakfast. 07/11/21    [provider]  polyethylene glycol (MIRALAX / GLYCOLAX) 17 g packet Take 17 g by mouth daily as needed (constipation.).    [provider]  prochlorperazine (COMPAZINE) 5 MG tablet Take 1-2 tablets (5-10 mg total) by mouth every 6 (six) hours as needed for nausea or vomiting. 09/21/21   Owens Shark, NP  Sennosides-Docusate Sodium (SENNA PLUS) 8.6-50 MG CAPS Take 2 capsules by mouth daily as needed. Patient not taking: Reported on 03/06/2022    [provider]  sertraline (ZOLOFT) 25 MG tablet Take 1 tablet (25 mg total) by mouth daily. 02/08/22   Owens Shark, NP  traMADol (ULTRAM) 50 MG tablet Take 1-2 tablets (50-100 mg total) by mouth every 6 (six) hours as needed for moderate pain. Max dose 400 mg/day 03/12/22   Owens Shark, NP      Allergies    Patient has no known allergies.    Review of Systems   Review of Systems  Gastrointestinal:  Positive for abdominal pain and constipation.  Musculoskeletal:  Positive for back pain.  All other systems reviewed and are negative.   Physical Exam Updated Vital Signs BP 125/68   Pulse 70   Temp 98.5 F (36.9 C) (Oral)   Resp 16   Ht '5\' 10"'$  (1.778 m)   Wt 47.6 kg   LMP 11/01/2018   SpO2 95%   BMI 15.07 kg/m  Physical Exam Vitals and nursing Jensen reviewed.  Constitutional:      General: She is not in acute distress.    Appearance: Normal appearance. She is normal weight. She is not ill-appearing, toxic-appearing or diaphoretic.  HENT:     Head: Normocephalic and atraumatic.  Cardiovascular:     Rate and Rhythm: Normal rate.  Pulmonary:     Effort: Pulmonary effort is normal. No respiratory distress.  Abdominal:     General: There is distension.     Palpations: Abdomen is rigid.     Tenderness: There is abdominal tenderness.  Musculoskeletal:        General: Normal range of motion.     Cervical back: Normal range of motion.  Skin:    General: Skin is warm and dry.  Neurological:     General:  No focal deficit present.     Mental Status: She is alert.  Psychiatric:        Mood and Affect: Mood normal.        Behavior: Behavior normal.     ED Results / Procedures / Treatments   Labs (all labs ordered are listed, but only abnormal results are displayed) Labs Reviewed  URINALYSIS, ROUTINE W REFLEX MICROSCOPIC - Abnormal; Notable for the following components:      Result Value   Ketones, ur 15 (*)    Protein, ur TRACE (*)    All other components within normal limits  PREGNANCY, URINE    EKG None  Radiology  CT ABDOMEN PELVIS W CONTRAST  Result Date: 03/20/2022 CLINICAL DATA:  Lower abdominal pain EXAM: CT ABDOMEN AND PELVIS WITH CONTRAST TECHNIQUE: Multidetector CT imaging of the abdomen and pelvis was performed using the standard protocol following bolus administration of intravenous contrast. RADIATION DOSE REDUCTION: This exam was performed according to the departmental dose-optimization program which includes automated exposure control, adjustment of the mA and/or kV according to patient size and/or use of iterative reconstruction technique. CONTRAST:  55m OMNIPAQUE IOHEXOL 300 MG/ML  SOLN COMPARISON:  CT 02/23/2022, 12/20/2021 FINDINGS: Lower chest: Lung bases demonstrate no acute airspace disease. Hepatobiliary: No focal liver abnormality is seen. No gallstones, gallbladder wall thickening, or biliary dilatation. Pancreas: Slight interval increase in size of hypodense mass involving the pancreatic neck and body, this measures 5 x 3.1 cm, previously 4 x 2.3 cm. Extension of mass towards the posterior gastric antrum, series 2, image 24, potential gastric involvement. Spleen: Normal in size without focal abnormality. Adrenals/Urinary Tract: Adrenal glands are unremarkable. Kidneys are normal, without renal calculi, focal lesion, or hydronephrosis. Bladder is unremarkable. Stomach/Bowel: Moderate fluid distension of the stomach. Mild gastric antral wall thickening as seen on the  previous exam. No dilated small bowel. Large stool burden Vascular/Lymphatic: Nonaneurysmal aorta. No suspicious lymph nodes. 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 Reproductive: Uterus and bilateral adnexa are unremarkable. Other: Negative for pelvic effusion or free air. Musculoskeletal: No acute or suspicious osseous abnormality. IMPRESSION: 1. Slight interval increase in size of hypodense mass involving the pancreatic neck and body, now measuring up to 5 cm. Extension of mass towards the posterior gastric antrum with moderate fluid distension of the stomach and mild gastric antral wall thickening, potential gastric involvement by tumor 2. Large stool burden suggesting constipation. There is no evidence for a bowel obstruction. Electronically Signed   By: KDonavan FoilM.D.   On: 03/20/2022 23:49    Procedures Procedures    Medications Ordered in ED Medications  HYDROmorphone (DILAUDID) injection 1 mg (1 mg Intravenous Given 03/20/22 2256)  ondansetron (ZOFRAN) injection 4 mg (4 mg Intravenous Given 03/20/22 2253)  sodium chloride 0.9 % bolus 1,000 mL (0 mLs Intravenous Stopped 03/21/22 0022)  iohexol (OMNIPAQUE) 300 MG/ML solution 100 mL (60 mLs Intravenous Contrast Given 03/20/22 2302)  HYDROmorphone (DILAUDID) injection 1 mg (1 mg Intravenous Given 03/20/22 2353)    ED Course/ Medical Decision Making/ A&P                             Medical Decision Making Amount and/or Complexity of Data Reviewed Labs: ordered. Radiology: ordered.  Risk Prescription drug management.   This patient is a 57y.o. female who presents to the ED for concern of abdominal pain and constipation, this involves an extensive number of treatment options, and is a complaint that carries with it a high risk of complications and morbidity. The emergent differential diagnosis prior to evaluation includes, but is not limited to,  constipation,  AAA, gastroenteritis, appendicitis, Bowel obstruction, Bowel perforation. Gastroparesis, DKA, Hernia, Inflammatory bowel disease, mesenteric ischemia, pancreatitis, peritonitis SBP, volvulus.  This is not an exhaustive differential.   Past Medical History / Co-morbidities / Social History: Hx stage IV pancreatic cancer on palliative chemotherapy with last infusion earlier today  Additional history: Chart reviewed. Pertinent results include: patient recently started on morphine for pain from her cancer  Physical Exam: Physical exam performed. The pertinent  findings include: abdomen is distended, hard and tender  Lab Tests: I ordered, and personally interpreted labs.  The pertinent results include:  Labs obtained at her oncology appointment earlier today and reveal no leukocytosis or neutropenia. Hgb 10.6 consistent with previous. UA with ketones and protein   Imaging Studies: I ordered imaging studies including Ct abdomen and pelvis with contrast. I independently visualized and interpreted imaging which showed   1. Slight interval increase in size of hypodense mass involving the pancreatic neck and body, now measuring up to 5 cm. Extension of mass towards the posterior gastric antrum with moderate fluid distension of the stomach and mild gastric antral wall thickening, potential gastric involvement by tumor 2. Large stool burden suggesting constipation. There is no evidence for a bowel obstruction.  I agree with the radiologist interpretation.     Medications: I ordered medication including fluids, zofran, and dilaudid  for pain, nausea, and dehydration. Reevaluation of the patient after these medicines showed that the patient improved. I have reviewed the patients home medicines and have made adjustments as needed.    Disposition:  Patient presents today with abdominal pain.  She has not had a bowel movement in 9 days.  CT imaging does show interval advancement of her known  pancreatic cancer, however it does also show a large stool burden suggesting constipation.  I suspect that this is the cause of her pain.  Imaging reviewed and it does not appear that she has stool in her rectal area and therefore do not think that disimpaction would be successful.  Plan for soapsuds enema and reassessment.  My hope is that this will resolve her symptoms and she will be able to go home with a bowel regiment to assist with opioid induced constipation in the future.  Administration of the enema is pending at shift change.  Care handoff to EDP Dr. Sedonia Small at shift change. Please see their Jensen for further evaluation and dispo   Final Clinical Impression(s) / ED Diagnoses Final diagnoses:  Generalized abdominal pain  Drug-induced constipation    Rx / DC Orders ED Discharge Orders     None         Nestor Lewandowsky 03/21/22 0149    Blanchie Dessert, MD 03/21/22 2247

## 2022-03-21 NOTE — ED Notes (Signed)
Report given to Oley Balm, Therapist, sports for [employer] at [facility]

## 2022-03-21 NOTE — ED Notes (Signed)
[facility] at bedside for transfer.

## 2022-03-22 ENCOUNTER — Other Ambulatory Visit: Payer: Self-pay | Admitting: Nurse Practitioner

## 2022-03-22 DIAGNOSIS — K5900 Constipation, unspecified: Secondary | ICD-10-CM

## 2022-03-22 DIAGNOSIS — C251 Malignant neoplasm of body of pancreas: Secondary | ICD-10-CM | POA: Diagnosis not present

## 2022-03-22 DIAGNOSIS — E43 Unspecified severe protein-calorie malnutrition: Secondary | ICD-10-CM

## 2022-03-22 DIAGNOSIS — R1084 Generalized abdominal pain: Secondary | ICD-10-CM

## 2022-03-22 LAB — COMPREHENSIVE METABOLIC PANEL
ALT: 20 U/L (ref 0–44)
AST: 16 U/L (ref 15–41)
Albumin: 3.8 g/dL (ref 3.5–5.0)
Alkaline Phosphatase: 53 U/L (ref 38–126)
Anion gap: 8 (ref 5–15)
BUN: 12 mg/dL (ref 6–20)
CO2: 25 mmol/L (ref 22–32)
Calcium: 8.5 mg/dL — ABNORMAL LOW (ref 8.9–10.3)
Chloride: 101 mmol/L (ref 98–111)
Creatinine, Ser: 0.41 mg/dL — ABNORMAL LOW (ref 0.44–1.00)
GFR, Estimated: >60 mL/min (ref >60)
Glucose, Bld: 149 mg/dL — ABNORMAL HIGH (ref 70–99)
Potassium: 3.6 mmol/L (ref 3.5–5.1)
Sodium: 134 mmol/L — ABNORMAL LOW (ref 135–145)
Total Bilirubin: 0.8 mg/dL (ref 0.3–1.2)
Total Protein: 7 g/dL (ref 6.5–8.1)

## 2022-03-22 LAB — PHOSPHORUS: Phosphorus: 3.8 mg/dL (ref 2.5–4.6)

## 2022-03-22 LAB — MAGNESIUM: Magnesium: 2.6 mg/dL — ABNORMAL HIGH (ref 1.7–2.4)

## 2022-03-22 MED ORDER — POLYETHYLENE GLYCOL 3350 17 G PO PACK: 17.0000 g | PACK | Freq: Two times a day (BID) | ORAL | 0 refills | Status: DC

## 2022-03-22 MED ORDER — HEPARIN SOD (PORK) LOCK FLUSH 100 UNIT/ML IV SOLN
500.0000 [IU] | INTRAVENOUS | Status: AC | PRN
  Administered 2022-03-22: 500 [IU]
  Filled 2022-03-22: qty 5

## 2022-03-22 MED ORDER — CHLORHEXIDINE GLUCONATE CLOTH 2 % EX PADS
6.0000 | MEDICATED_PAD | Freq: Every day | CUTANEOUS | Status: DC
  Administered 2022-03-22: 6 via TOPICAL

## 2022-03-22 MED ORDER — HYDROMORPHONE HCL 2 MG PO TABS: 2.0000 mg | ORAL_TABLET | ORAL | 0 refills | Status: DC | PRN

## 2022-03-22 MED ORDER — GOLYTELY 236 G PO SOLR: 4.0000 L | Freq: Once | ORAL | 0 refills | Status: AC

## 2022-03-22 MED ORDER — MORPHINE SULFATE ER 30 MG PO TBCR
30.0000 mg | EXTENDED_RELEASE_TABLET | Freq: Two times a day (BID) | ORAL | Status: DC
  Administered 2022-03-22: 30 mg via ORAL
  Filled 2022-03-22: qty 1

## 2022-03-22 MED ORDER — SODIUM CHLORIDE 0.9% FLUSH: 10.0000 mL | Freq: Two times a day (BID) | INTRAVENOUS | Status: DC

## 2022-03-22 MED ORDER — POLYETHYLENE GLYCOL 3350 17 G PO PACK: 17.0000 g | PACK | Freq: Two times a day (BID) | ORAL | Status: DC

## 2022-03-22 MED ORDER — SODIUM CHLORIDE 0.9% FLUSH: 10.0000 mL | INTRAVENOUS | Status: DC | PRN

## 2022-03-22 NOTE — Progress Notes (Signed)
IP PROGRESS NOTE  Subjective:   Tara Jensen is well-known to me with a history of metastatic pancreas cancer.  She is currently being treated with gemcitabine/Abraxane chemotherapy.  She completed cycle 2 on 03/20/2022.  She tolerated the chemotherapy without acute toxicity.  She has developed increased abdominal pain over the past few weeks.  She was started on MS Contin last week.  The MS Contin was increased to 07/01/2022 and MS IR was added for breakthrough pain. She presented to the emergency room with increased abdomen/back pain and constipation on 03/21/2022.  She was admitted for further evaluation. She has been treated with Dilaudid in the hospital.  She denies pain this morning.  She complains of persistent constipation.  Objective: Vital signs in last 24 hours: Blood pressure 116/70, pulse 75, temperature 98.7 F (37.1 C), resp. rate 16, height 5' 10"$  (1.778 m), weight 105 lb (47.6 kg), last menstrual period 11/01/2018, SpO2 98 %.  Intake/Output from previous day: 02/08 0701 - 02/09 0700 In: 2921.4 [P.O.:1200; I.V.:1721.4] Out: -   Physical Exam:  HEENT: No thrush Lungs: Clear bilaterally Cardiac: Regular rate and rhythm Abdomen: Soft, no mass, no hepatosplenomegaly, nontender Extremities: No leg edema   Portacath/PICC-without erythema  Lab Results: Recent Labs    03/20/22 0826 03/21/22 0619  WBC 4.9 5.7  HGB 10.6* 10.9*  HCT 32.8* 33.8*  PLT 186 135*    BMET Recent Labs    03/21/22 0619 03/22/22 0849  NA 136 134*  K 3.7 3.6  CL 104 101  CO2 25 25  GLUCOSE 123* 149*  BUN 12 12  CREATININE 0.42* 0.41*  CALCIUM 8.7* 8.5*    Lab Results  Component Value Date   CAN199 975 (H) 03/04/2022    Studies/Results: CT ABDOMEN PELVIS W CONTRAST  Result Date: 03/20/2022 CLINICAL DATA:  Lower abdominal pain EXAM: CT ABDOMEN AND PELVIS WITH CONTRAST TECHNIQUE: Multidetector CT imaging of the abdomen and pelvis was performed using the standard protocol following  bolus administration of intravenous contrast. RADIATION DOSE REDUCTION: This exam was performed according to the departmental dose-optimization program which includes automated exposure control, adjustment of the mA and/or kV according to patient size and/or use of iterative reconstruction technique. CONTRAST:  39m OMNIPAQUE IOHEXOL 300 MG/ML  SOLN COMPARISON:  CT 02/23/2022, 12/20/2021 FINDINGS: Lower chest: Lung bases demonstrate no acute airspace disease. Hepatobiliary: No focal liver abnormality is seen. No gallstones, gallbladder wall thickening, or biliary dilatation. Pancreas: Slight interval increase in size of hypodense mass involving the pancreatic neck and body, this measures 5 x 3.1 cm, previously 4 x 2.3 cm. Extension of mass towards the posterior gastric antrum, series 2, image 24, potential gastric involvement. Spleen: Normal in size without focal abnormality. Adrenals/Urinary Tract: Adrenal glands are unremarkable. Kidneys are normal, without renal calculi, focal lesion, or hydronephrosis. Bladder is unremarkable. Stomach/Bowel: Moderate fluid distension of the stomach. Mild gastric antral wall thickening as seen on the previous exam. No dilated small bowel. Large stool burden Vascular/Lymphatic: Nonaneurysmal aorta. No suspicious lymph nodes. 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 Reproductive: Uterus and bilateral adnexa are unremarkable. Other: Negative for pelvic effusion or free air. Musculoskeletal: No acute or suspicious osseous abnormality. IMPRESSION: 1. Slight interval increase in size of hypodense mass involving the pancreatic neck and body, now measuring up to 5 cm. Extension of mass towards the posterior gastric antrum with moderate fluid distension of the stomach and mild gastric antral wall thickening, potential  gastric involvement by tumor 2. Large stool burden suggesting constipation. There is no  evidence for a bowel obstruction. Electronically Signed   By: Donavan Foil M.D.   On: 03/20/2022 23:49    Medications: I have reviewed the patient's current medications.  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  Less than 180 degree abutment of the distal celiac, common hepatic, and splenic arteries, less than 180 degree abutment of the SMA CT chest 07/18/2021-no evidence of metastatic disease EUS 07/16/2021-extrinsic impression on the stomach at the posterior wall of the gastric body, no gross lesion in the duodenum, 45 x 36 mm pancreas body mass, abutment of the SMA, celiac trunk, and compression of the splenoportal confluence.  No malignant appearing lymph nodes, numerous venous collaterals adjacent to the portal vein, FNA biopsy-malignant cells consistent with adenocarcinoma, T4N0 by EUS Markedly elevated CA 19-9 [employer] 07/31/2021-K-ras G12R, MSI high-not detected, ATM VUS Cycle 1 FOLFOX 08/08/2021 Cycle 2 FOLFIRINOX 08/22/2021 CT abdomen[facility]pelvis at [facility] 08/31/2021-hypoattenuating liver lesions consistent with metastases, mass in the pancreas neck/body with greater than 100 degree encasement of the celiac axis and common hepatic artery.  Less than 180 degree abutment of the SMA, long segment occlusion of the portal splenic confluence, splenic vein occluded, multiple collateral vessels in the upper abdomen, prominent left periaortic node Cycle 3 FOLFIRINOX 09/05/2021 Cycle 4 FOLFIRINOX 09/19/2021 Cycle 5 FOLFIRINOX 10/03/2021 CTs 10/16/2021-decrease size of primary pancreas mass, hepatic metastases, and a suspicious retroperitoneal node, resolution of left supraclavicular/low jugular adenopathy compared to a CT chest 07/18/2021.  No evidence of disease progression. Cycle 6 FOLFIRINOX  10/17/2021 Cycle 7 FOLFIRINOX 10/31/2021 Cycle 8 FOLFIRINOX 11/14/2021 Cycle 9 FOLFIRINOX 11/28/2021 Cycle 10 FOLFIRINOX 12/12/2021, oxaliplatin held secondary to neuropathy symptoms CTs 12/20/2021-decrease size of pancreas primary, no evidence of hepatic metastases, no new or progressive disease Cycle 11 FOLFIRINOX 12/27/2021, oxaliplatin held due to neuropathy symptoms Cycle 12 FOLFIRINOX 01/09/2022, oxaliplatin held 01/22/2022-C19-9 higher, 530 Cycle 13 FOLFIRINOX 01/24/2022, oxaliplatin resumed Cycle 14 FOLFIRINOX 02/06/2022 CTs 02/23/2022-increased size of the pancreas body mass new focal gastric wall thickening and loss of fat plane between the mass and the proximal gastric wall, no evidence of new or progressive metastatic disease Cycle 1 gemcitabine/Abraxane 03/06/2022 Cycle 2 gemcitabine/Abraxane 03/20/2022 CT abdomen/pelvis was 03/20/2022-slight interval increase in size of the pancreas neck/body mass with extension to the posterior gastric antrum Pain secondary to #1-controlled with tramadol and Tylenol. 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  She completed cycle 2 gemcitabine/Abraxane on 03/20/2022.  She presented to the emergency room 03/21/2022 with increased pain and constipation.  The pain is secondary to the pancreas tumor.  She underwent a celiac block last month.  This has not relieved her pain.  She was started on MS Contin last week.  I recommend she continue MS Contin at a dose of 30 mg every 12 hours.  She reports relief of pain with IV Dilaudid during this admission.  She will try Dilaudid, 2-4 mg every 4 hours as needed, as needed for breakthrough  pain. She started a bowel regimen during this hospital admission  with MiraLAX and Senokot.  She began Nulytely last night.  No bowel movement yet.  Tara Jensen insists she be discharged from the hospital.  I recommend she increase ambulation and continue the bowel regimen.  She will continue MS Contin and try Dilaudid for breakthrough pain.  She will return for office visit as scheduled.  We will see her sooner as needed.   LOS: 1 day   Betsy Coder, MD   03/22/2022, 10:11 AM

## 2022-03-22 NOTE — Discharge Summary (Signed)
Physician Discharge Summary   Patient: Tara Jensen MRN: [id number] DOB: January 28, 1966  Admit date:     03/20/2022  Discharge date: 03/22/22  Discharge Physician: Raiford Noble, DO   PCP: Chesley Noon, MD   Recommendations at discharge:   Follow-up with PCP within 1 to 2 weeks and repeat CBC, CMP, mag, Phos within 1 week Follow-up with medical oncology Dr. Benay Spice on Monday and continue bowel regimen at discharge.  Discharge Diagnoses: Principal Problem:   Abdominal pain Active Problems:   Protein-calorie malnutrition, severe  Resolved Problems:   * No resolved hospital problems. *  Hospital Course: Patient is a 57 year old unfortunate Caucasian female with past medical history significant for but not limited to pancreatic cancer, chemotherapy-induced neuropathy, GERD, chronic anxiety depression as well as other comorbidities who initially presented to the [facility] emergency room due to worsening abdominal pain and constipation for 9 days as well as nausea and vomiting.  She received her second cycle of chemotherapy yesterday and yesterday was the first time that she vomited since her diagnosis of pancreatic cancer on Jul 11, 2021.  In the ED she received multiple rounds of IV Dilaudid and a subsequent enema and still had no bowel movement.  Due to her persistent symptomology the EDP requested admission and he was transferred to the [facility] for further evaluation.  In the ED she underwent imaging and had a CT scan of the abdomen pelvis which showed a slight interval increase in the size of the hypodense mass involving the pancreatic neck and body which is now measuring up to 5 cm as well as extension of the mass towards the posterior gastric antrum with moderate fluid distention of the stomach and mild gastric antral wall thickening with potential gastric involvement by the tumor.  He was also noted to have a large burden of stool suggesting constipation but no evidence of  bowel obstruction.  I have notified her primary oncologist Dr. Illene Regulus will see her in the a.m. but recommends pain control and working on improving her constipation.   Dr. Benay Spice evaluated her this morning and patient had improved.  Her abdominal pain had significantly improved with her regimen and Dilaudid so she was changed to oral Dilaudid by the oncologist.  Patient was given a smog enema as well as a Fleet enema and however still not had a bowel movement but started drinking her MiraLAX and felt as if abdominal was coming.  She was placed on a bowel prep and started drinking it.  Case was discussed with oncology who states that she can be discharged with close follow-up on Monday and they will call to find out the patient has a bowel movement and make further additional changes.  She was provided a bowel prep of GoLytely prior to discharge at her pharmacy which she will continue and she felt that she could manage this at home and that she would be better suited at home given her lack of rest while in the hospital.  She is stable for discharge and will need to follow-up with PCP as well as medical oncology and will see Dr. Benay Spice on Monday.  Assessment and Plan:  Abdominal pain in the setting of pancreatic cancer and constipation x 10 days, abdominal pain is improved however she still not had a bowel movement yet however she thinks she can manage this at home given that she is continuing her bowel prep  -No bowel obstruction on CT scan, showing interval increase in size  of hypodense mass involving the pancreatic neck and body now measuring up to 5 cm.  Extension of mass towards the posterior gastric antrum with moderate fluid distention of the stomach and mild gastric antral wall thickening, potential gastric involvement by tumor. -C/w As needed analgesics along with bowel regimen with bowel prep and MiraLAX twice daily at home as well as senna docusate 2 tabs twice daily -IV antiemetics as  needed and she is not nausea Smog enema x 1 initiated however she still not have a bowel movement so she was initiated on Nulytely bowel prep.  Still did not have a bowel movement prior to leaving the hospital but she insisted on being discharged given that she states that she could "do the bowel prep at home".  -Patient's abdomen is soft on abdominal exam and she is passing flatus and felt like her bowel movement was coming.  She is stable for discharge at this time and oncology has made some recommendations and changes and will follow-up with the oncologist next Monday. -Gentle IV fluid hydration while she was hospitalized -Appreciate medical oncology evaluation.   Pancreatic cancer on chemotherapy -Received second cycle of gemcitabine/Abraxane yesterday -Pateint's CA 19-9 went from 734 -> 975 -Followed by medical oncology Dr.Sherrill -Dr. Benay Spice notified and he seen the patient and has changed her regimen and recommends continuing MS Contin and changing her to oral Dilaudid for breakthrough pain.  She will follow-up with Dr. Benay Spice on Monday   GERD/GI Prophylaxis  -Resume home PPI with Pantoprazole 40 mg po daily and H2 blocker with Famotidine 40 mg po BID   Chemotherapy-induced neuropathy -Resume home regimen with Gabapentin 100 mg po TID   Chronic Anxiety/Depression -C/w Sertraline 25 mg po Daily    Severe Protein Calorie Malnutrition Estimated body mass index is 15.07 kg/m as calculated from the following:   Height as of this encounter: 5' 10"$  (1.778 m).   Weight as of this encounter: 47.6 kg. -Dietitian consulted she has severe malnutrition in the context of illness and recommending encouraging p.o. intakes as well as supplements 3 times daily and adding a multivitamin with minerals daily Nutrition Status: Nutrition Problem: Severe Malnutrition Etiology: acute illness (constipation) Signs/Symptoms: moderate fat depletion, energy intake < or equal to 50% for > or equal to 5  days Interventions: Refer to RD note for recommendations, Education    Macrocytic Anemia -Hgb/Hct Trend: Recent Labs  Lab 03/04/22 1012 03/11/22 0854 03/20/22 0826 03/21/22 0619  HGB 10.2* 9.7* 10.6* 10.9*  HCT 32.0* 30.1* 32.8* 33.8*  MCV 101.6* 103.4* 100.6* 101.8*  -Check Anemia Panel in the outpatient setting -Continue to Monitor for S/Sx of Bleeding; No overt bleeding noted -Repeat CBC in the AM    Hyperglycemia -Blood Sugars ranging from 85-149 -Check HbA1c in outpatient setting -Continue to monitor blood sugars per protocol and if necessary will add sensitive NovoLog sliding scale insulin AC  Nutrition Documentation    Flowsheet Row ED to Hosp-Admission (Discharged) from 03/20/2022 in [facility] [place] [facility]  Nutrition Problem Severe Malnutrition  Etiology acute illness  [constipation]  Nutrition Goal Patient will meet greater than or equal to 90% of their needs  Interventions Refer to RD note for recommendations, Education      Consultants: Medical oncology Procedures performed: None Disposition: Home Diet recommendation:  Discharge Diet Orders (From admission, onward)     Start     Ordered   03/22/22 0000  Diet general        03/22/22 1211  Regular diet DISCHARGE MEDICATION: Allergies as of 03/22/2022   No Known Allergies      Medication List     TAKE these medications    acetaminophen 500 MG tablet Commonly known as: TYLENOL Take 1,000 mg by mouth every 6 (six) hours as needed for moderate pain or mild pain.   BIOTIN PO Take 2,500 mcg by mouth daily.   dexamethasone 4 MG tablet Commonly known as: DECADRON Take 1 tablet (4 mg total) by mouth 2 (two) times daily with a meal. Take twice daily x 2 days after each chemotherapy treatment beginning day 2 of each cycle   diclofenac Sodium 1 % Gel Commonly known as: VOLTAREN Apply 2 g topically 4 (four) times daily as needed (back pain.).   famotidine 40 MG tablet Commonly known  as: PEPCID Take 40 mg by mouth 2 (two) times daily.   gabapentin 300 MG capsule Commonly known as: NEURONTIN Take 1 capsule (300 mg total) by mouth 3 (three) times daily.   Golytely 236 g solution Generic drug: polyethylene glycol Take 4,000 mLs by mouth once for 1 dose.   HYDROmorphone 2 MG tablet Commonly known as: Dilaudid Take 1-2 tablets (2-4 mg total) by mouth every 4 (four) hours as needed for severe pain.   lidocaine-prilocaine cream Commonly known as: EMLA Apply 1 Application topically as needed.   magic mouthwash Soln Take 5 mLs by mouth 4 (four) times daily.   morphine 15 MG 12 hr tablet Commonly known as: MS CONTIN Take 1 tablet (15 mg total) by mouth every 12 (twelve) hours. What changed: Another medication with the same name was removed. Continue taking this medication, and follow the directions you see here.   ondansetron 8 MG tablet Commonly known as: ZOFRAN Take 1 tablet (8 mg total) by mouth every 8 (eight) hours as needed for nausea or vomiting (Starting day 4 after chemo as needed for nausea and vomiting).   pantoprazole 40 MG tablet Commonly known as: PROTONIX Take 40 mg by mouth daily before breakfast.   polyethylene glycol 17 g packet Commonly known as: MIRALAX / GLYCOLAX Take 17 g by mouth 2 (two) times daily. What changed:  when to take this reasons to take this   prochlorperazine 5 MG tablet Commonly known as: COMPAZINE Take 1-2 tablets (5-10 mg total) by mouth every 6 (six) hours as needed for nausea or vomiting.   Senna Plus 8.6-50 MG Caps Generic drug: Sennosides-Docusate Sodium Take 2 capsules by mouth daily as needed (For constipation).   sertraline 25 MG tablet Commonly known as: ZOLOFT Take 1 tablet (25 mg total) by mouth daily.   traMADol 50 MG tablet Commonly known as: ULTRAM Take 1-2 tablets (50-100 mg total) by mouth every 6 (six) hours as needed for moderate pain. Max dose 400 mg/day What changed:  how much to  take additional instructions        Discharge Exam: Filed Weights   03/20/22 2110  Weight: 47.6 kg   Vitals:   03/21/22 2221 03/22/22 0536  BP: 110/64 116/70  Pulse: 73 75  Resp: 16 16  Temp: 98.1 F (36.7 C) 98.7 F (37.1 C)  SpO2: 98% 98%   Examination: Physical Exam:  Constitutional: Thin chronically ill-appearing Caucasian female currently no acute distress Respiratory: Diminished to auscultation bilaterally, no wheezing, rales, rhonchi or crackles. Normal respiratory effort and patient is not tachypenic. No accessory muscle use.  Unlabored breathing Cardiovascular: RRR, no murmurs / rubs / gallops. S1 and S2 auscultated. No extremity  edema.  Abdomen: Soft, non-tender, mildly distended. Bowel sounds positive.  GU: Deferred. Musculoskeletal: No clubbing / cyanosis of digits/nails. No joint deformity upper and lower extremities. Skin: No rashes, lesions, ulcers limited skin evaluation. No induration; Warm and dry.  Neurologic: CN 2-12 grossly intact with no focal deficits. Romberg sign and cerebellar reflexes not assessed.  Psychiatric: Normal judgment and insight. Alert and oriented x 3.  Slightly anxious mood and appropriate affect.   Condition at discharge: stable  The results of significant diagnostics from this hospitalization (including imaging, microbiology, ancillary and laboratory) are listed below for reference.   Imaging Studies: CT ABDOMEN PELVIS W CONTRAST  Result Date: 03/20/2022 CLINICAL DATA:  Lower abdominal pain EXAM: CT ABDOMEN AND PELVIS WITH CONTRAST TECHNIQUE: Multidetector CT imaging of the abdomen and pelvis was performed using the standard protocol following bolus administration of intravenous contrast. RADIATION DOSE REDUCTION: This exam was performed according to the departmental dose-optimization program which includes automated exposure control, adjustment of the mA and/or kV according to patient size and/or use of iterative reconstruction  technique. CONTRAST:  26m OMNIPAQUE IOHEXOL 300 MG/ML  SOLN COMPARISON:  CT 02/23/2022, 12/20/2021 FINDINGS: Lower chest: Lung bases demonstrate no acute airspace disease. Hepatobiliary: No focal liver abnormality is seen. No gallstones, gallbladder wall thickening, or biliary dilatation. Pancreas: Slight interval increase in size of hypodense mass involving the pancreatic neck and body, this measures 5 x 3.1 cm, previously 4 x 2.3 cm. Extension of mass towards the posterior gastric antrum, series 2, image 24, potential gastric involvement. Spleen: Normal in size without focal abnormality. Adrenals/Urinary Tract: Adrenal glands are unremarkable. Kidneys are normal, without renal calculi, focal lesion, or hydronephrosis. Bladder is unremarkable. Stomach/Bowel: Moderate fluid distension of the stomach. Mild gastric antral wall thickening as seen on the previous exam. No dilated small bowel. Large stool burden Vascular/Lymphatic: Nonaneurysmal aorta. No suspicious lymph nodes. 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 Reproductive: Uterus and bilateral adnexa are unremarkable. Other: Negative for pelvic effusion or free air. Musculoskeletal: No acute or suspicious osseous abnormality. IMPRESSION: 1. Slight interval increase in size of hypodense mass involving the pancreatic neck and body, now measuring up to 5 cm. Extension of mass towards the posterior gastric antrum with moderate fluid distension of the stomach and mild gastric antral wall thickening, potential gastric involvement by tumor 2. Large stool burden suggesting constipation. There is no evidence for a bowel obstruction. Electronically Signed   By: KDonavan FoilM.D.   On: 03/20/2022 23:49   CT CELIAC PLEXUS BLOCK ANESTHETIC  Result Date: 03/11/2022 INDICATION: pancreas cancer, severe abdominal/back pain EXAM: CT-GUIDED CELIAC PLEXUS NERVE BLOCK, DIAGNOSTIC AND THERAPEUTIC  COMPARISON:  CT CAP, 02/23/2022 MEDICATIONS: 80 mg Kenalog and 30 mL 0.5% bupivacaine. ANESTHESIA/SEDATION: Moderate (conscious) sedation was employed during this procedure. A total of Versed 5 mg and Fentanyl 175 mcg was administered intravenously. Moderate Sedation Time: 45 minutes. The patient's level of consciousness and vital signs were monitored continuously by radiology nursing throughout the procedure under my direct supervision. CONTRAST:  2 mL Omnipaque 300 FLUOROSCOPY TIME:  CT dose in mGy was not provided. COMPLICATIONS: None immediate. PROCEDURE: RADIATION DOSE REDUCTION: This exam was performed according to the departmental dose-optimization program which includes automated exposure control, adjustment of the mA and/or kV according to patient size and/or use of iterative reconstruction technique. Informed consent was obtained from the patient following an explanation of the procedure, risks, benefits and alternatives. A time out  was performed prior to the initiation of the procedure. The patient was positioned prone on the CT table and a limited CT was performed for procedural planning demonstrating an adequate window at the upper abdomen. The procedure was planned. A timeout was performed prior to the initiation of the procedure. The operative site was prepped and draped in the usual sterile fashion. Appropriate trajectory was confirmed with a 22 gauge spinal needle after the adjacent tissues were anesthetized with 1% Lidocaine with epinephrine. Under intermittent CT guidance, 15 cm 21-gauge Chiba needles were inserted via a posterior approach and the needle tips were positioned in the retroperitoneal space immediately anterolateral to the aorta, at the region of the celiac trunk. A small amount of dilute contrast was injected through the needles to confirm position. A solution containing 2 mL of 40 mg/mL Kenalog and 15 mL of Bupivacaine was mixed and injected through each needle. Intermittent CT  scanning confirmed appropriate spread into the extra vascular spaces. The needles were removed and hemostasis was achieved with manual compression. A limited postprocedural CT was negative for hemorrhage or additional complication. A dressing was placed. The patient tolerated the procedure well without immediate postprocedural complication. IMPRESSION: Successful CT-guided diagnostic and therapeutic celiac plexus nerve block, as above. PLAN: The patient will be re-evaluated in the postprocedural setting and again in 3-6 months to assess the efficacy of this nerve block. If successful, the patient may be a candidate for future celiac plexus nerve blocks versus neurolysis. Michaelle Birks, MD [facility] Electronically Signed   By: Michaelle Birks M.D.   On: 03/11/2022 16:16   MM 3D SCREEN BREAST BILATERAL  Result Date: 03/06/2022 CLINICAL DATA:  Screening. EXAM: DIGITAL SCREENING BILATERAL MAMMOGRAM WITH TOMOSYNTHESIS AND CAD TECHNIQUE: Bilateral screening digital craniocaudal and mediolateral oblique mammograms were obtained. Bilateral screening digital breast tomosynthesis was performed. The images were evaluated with computer-aided detection. COMPARISON:  Previous exam(s). ACR Breast Density Category c: The breast tissue is heterogeneously dense, which may obscure small masses. FINDINGS: There are no findings suspicious for malignancy. IMPRESSION: No mammographic evidence of malignancy. A result letter of this screening mammogram will be mailed directly to the patient. RECOMMENDATION: Screening mammogram in one year. (Code:SM-B-01Y) BI-RADS CATEGORY  1: Negative. Electronically Signed   By: Ammie Ferrier M.D.   On: 03/06/2022 16:20   IR Radiologist Eval & Mgmt  Result Date: 02/26/2022 EXAM: NEW PATIENT OFFICE VISIT CHIEF COMPLAINT: See below HISTORY OF PRESENT ILLNESS: See below REVIEW OF SYSTEMS: See below PHYSICAL EXAMINATION: See below ASSESSMENT  AND PLAN: Please refer to completed note in the electronic medical record on [place] Mug[place]u, MD [facility]iology Electronically Signed   By: Michaelle Birks M.D.   On: 02/26/2022 14:15   CT CHEST ABDOMEN PELVIS W CONTRAST  Result Date: 02/24/2022 CLINICAL DATA:  Restaging pancreatic cancer.  * Tracking Code: BO * EXAM: CT CHEST, ABDOMEN, AND PELVIS WITH CONTRAST TECHNIQUE: Multidetector CT imaging of the chest, abdomen and pelvis was performed following the standard protocol during bolus administration of intravenous contrast. RADIATION DOSE REDUCTION: This exam was performed according to the departmental dose-optimization program which includes automated exposure control, adjustment of the mA and/or kV according to patient size and/or use of iterative reconstruction technique. CONTRAST:  67m OMNIPAQUE IOHEXOL 300 MG/ML  SOLN COMPARISON:  Multiple priors including most recent CT December 20, 2021 FINDINGS: CT CHEST FINDINGS Cardiovascular: Accessed right chest Port-A-Cath with tip in the low  SVC. Normal caliber thoracic aorta. No central pulmonary embolus on this nondedicated study. Normal size heart. No significant pericardial effusion/thickening. Mediastinum/Nodes: No supraclavicular adenopathy. No suspicious thyroid nodule. No pathologically enlarged mediastinal, hilar or axillary lymph nodes. The esophagus is grossly unremarkable. Lungs/Pleura: No suspicious pulmonary nodules or masses. Near-complete resolution of the right lower lobe ground-glass opacity on image 134/4. Probable subpleural lymph node along the right major fissure on image 71/4 is unchanged. No pleural effusion. No pneumothorax. Musculoskeletal: No aggressive lytic or blastic lesion of bone. CT ABDOMEN PELVIS FINDINGS Hepatobiliary: No suspicious hepatic lesion. Previously described hepatic metastasis are not evident. Gallbladder is decompressed. No biliary ductal dilation.  Pancreas: Increased size of the mass in the pancreatic neck/body junction measuring 4.0 x 2.3 cm on image 73/2 previously 2.6 x 2.4 cm. There is a loss of fat plane between the proximal gastric antral wall in the mass for instance on image 74/2 and 75/2 with focal wall thickening of the stomach in this area. Similar pancreatic tail and upstream body atrophy with ductal dilation. Spleen: No splenomegaly or focal splenic lesion. Adrenals/Urinary Tract: Bilateral adrenal glands are within normal limits. No suspicious renal mass. No hydronephrosis. Urinary bladder is unremarkable for degree of distension. Stomach/Bowel: New focal gastric wall thickening as above. No pathologic dilation of small or large bowel. Hepatic stool burden suggestive of constipation. Vascular/Lymphatic: Aortic atherosclerosis. 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 Similar abutment without encasement of the SMA and distal celiac artery at its branches. No pathologically enlarged abdominal or pelvic lymph nodes identified. Reproductive: Uterus and bilateral adnexa are unremarkable. Other: No significant abdominopelvic free fluid. No discrete peritoneal or omental nodularity. Musculoskeletal: No aggressive lytic or blastic lesion of bone. Right pubic bone island unchanged. Transitional lumbosacral anatomy. IMPRESSION: 1. Increased size of the pancreatic neck/body junction mass with new focal gastric wall thickening and loss of fat plane between the mass and the proximal gastric antral wall concerning for primary disease progression and new gastric involvement. Consider further evaluation with EUS. 2. No evidence of new or progressive metastatic disease within the chest, abdomen or pelvis. 3. Near-complete resolution of the right lower lobe ground-glass opacity, consistent with resolving infectious or inflammatory process. 4.  Aortic Atherosclerosis (ICD10-I70.0). These results will be called to the  ordering clinician or representative by the Radiologist Assistant, and communication documented in the PACS or [employer]. Electronically Signed   By: Dahlia Bailiff M.D.   On: 02/24/2022 10:16    Microbiology: Results for orders placed or performed in visit on 07/25/21  Culture, Blood     Status: None   Collection Time: 07/25/21  9:17 AM   Specimen: BLOOD  Result Value Ref Range Status   Specimen Description   Final    BLOOD RIGHT ANTECUBITAL Performed at [facility], [place] [address] [place], [place] [address]    Special Requests   Final    BOTTLES DRAWN AEROBIC AND ANAEROBIC Blood Culture adequate volume Performed at [facility], [address] [place], [place] [address]    Culture   Final    NO GROWTH 5 DAYS Performed at [facility], [place] [address] [place],  [address]    Report Status 07/30/2021 FINAL  Final  Culture, Urine     Status: Abnormal   Collection Time: 07/25/21  1:15 PM   Specimen: Urine, Clean Catch  Result Value Ref Range Status   Specimen Description   Final    URINE, CLEAN CATCH  Performed at [facility], [address], [place], [place] [address]    Special Requests   Final    NONE Performed at [facility], [address], [place], [place] [address]    Culture 10,000 COLONIES/mL STAPHYLOCOCCUS EPIDERMIDIS (A)  Final   Report Status 07/27/2021 FINAL  Final   Organism ID, Bacteria STAPHYLOCOCCUS EPIDERMIDIS (A)  Final      Susceptibility   Staphylococcus epidermidis - MIC*    CIPROFLOXACIN >=8 RESISTANT Resistant     GENTAMICIN <=0.5 SENSITIVE Sensitive     NITROFURANTOIN <=16 SENSITIVE Sensitive     OXACILLIN <=0.25 SENSITIVE Sensitive     TETRACYCLINE 2 SENSITIVE Sensitive     VANCOMYCIN 2 SENSITIVE Sensitive     TRIMETH/SULFA >=320 RESISTANT Resistant     CLINDAMYCIN <=0.25 SENSITIVE Sensitive     RIFAMPIN <=0.5 SENSITIVE Sensitive     Inducible Clindamycin  NEGATIVE Sensitive     * 10,000 COLONIES/mL STAPHYLOCOCCUS EPIDERMIDIS   Labs: CBC: Recent Labs  Lab 03/20/22 0826 03/21/22 0619  WBC 4.9 5.7  NEUTROABS 3.4  --   HGB 10.6* 10.9*  HCT 32.8* 33.8*  MCV 100.6* 101.8*  PLT 186 [id number]*   Basic Metabolic Panel: Recent Labs  Lab 03/20/22 0826 03/21/22 0619 03/22/22 0849  NA 137 136 134*  K 4.0 3.7 3.6  CL 102 104 101  CO2 29 25 25  $ GLUCOSE 122* 123* 149*  BUN 21* 12 12  CREATININE 0.61 0.42* 0.41*  CALCIUM 9.6 8.7* 8.5*  MG  --  2.2 2.6*  PHOS  --  3.9 3.8   Liver Function Tests: Recent Labs  Lab 03/20/22 0826 03/22/22 0849  AST 16 16  ALT 27 20  ALKPHOS 62 53  BILITOT 0.4 0.8  PROT 7.5 7.0  ALBUMIN 4.5 3.8   CBG: No results for input(s): "GLUCAP" in the last 168 hours.  Discharge time spent: greater than 30 minutes.  Signed: Raiford [place], DO [facility] 03/22/2022

## 2022-03-22 NOTE — Progress Notes (Incomplete)
PROGRESS NOTE    Tara Jensen  [id number] DOB: Aug 01, 1965 DOA: 03/20/2022 PCP: Chesley Noon, MD   Brief Narrative:  No notes on file    Assessment and Plan: No notes have been filed under this hospital service. Service: Hospitalist  Abdominal pain in the setting of pancreatic cancer and constipation x 9 days -No bowel obstruction on CT scan, showing interval increase in size of hypodense mass involving the pancreatic neck and body now measuring up to 5 cm.  Extension of mass towards the posterior gastric antrum with moderate fluid distention of the stomach and mild gastric antral wall thickening, potential gastric involvement by tumor. -C/w As needed analgesics along with bowel regimen -IV antiemetics as needed Smog enema x 1 -Gentle IV fluid hydration -Oncology has been consulted for further evaluation recommendations and will see the patient tomorrow   Pancreatic cancer on chemotherapy -Received second cycle of gemcitabine/Abraxane yesterday -Pateint's CA 19-9 went from 734 -> 975 -Followed by medical oncology Dr.Sherrill -Dr. Benay Spice notified and will see the patient tomorrow   GERD/GI Prophylaxis  -Resume home PPI with Pantoprazole 40 mg po daily and H2 blocker with Famotidine 40 mg po BID   Chemotherapy-induced neuropathy -Resume home regimen with Gabapentin 100 mg po TID   Chronic Anxiety/Depression -C/w Sertraline 25 mg po Daily    Severe Protein Calorie Malnutrition Estimated body mass index is 15.07 kg/m as calculated from the following:   Height as of this encounter: 5' 10"$  (1.778 m).   Weight as of this encounter: 47.6 kg. -Dietitian consult   Macrocytic Anemia -Hgb/Hct Trend: Last Labs        Recent Labs  Lab 03/04/22 1012 03/11/22 0854 03/20/22 0826 03/21/22 0619  HGB 10.2* 9.7* 10.6* 10.9*  HCT 32.0* 30.1* 32.8* 33.8*  MCV 101.6* 103.4* 100.6* 101.8*    -Check Anemia Panel in the AM -Continue to Monitor for S/Sx of Bleeding; No  overt bleeding noted -Repeat CBC in the AM    Hyperglycemia -Blood Sugars ranging from 85-123 -Check HbA1c in the AM  -Continue to monitor blood sugars per protocol and if necessary will add sensitive NovoLog sliding scale insulin AC  ___  Severe Malnutrition in the context of Acute Illness/Injury -Estimated body mass index is 15.07 kg/m as calculated from the following:   Height as of this encounter: 5' 10"$  (1.778 m).   Weight as of this encounter: 47.6 kg. -Nutrition Status: Nutrition Problem: Severe Malnutrition Etiology: acute illness (constipation) Signs/Symptoms: moderate fat depletion, energy intake < or equal to 50% for > or equal to 5 days Interventions: Refer to RD note for recommendations, Education  DVT prophylaxis: enoxaparin (LOVENOX) injection 40 mg Start: 03/21/22 1000    Code Status: Full Code Family Communication:   Disposition Plan:  Level of care: Med-Surg Status is: Inpatient {Inpatient:23812}    Consultants:  ***  Procedures:  ***  Antimicrobials:  Anti-infectives (From admission, onward)    None        Subjective: ***  Objective: Vitals:   03/21/22 0404 03/21/22 1304 03/21/22 2221 03/22/22 0536  BP: 123/75 116/65 110/64 116/70  Pulse: 70 86 73 75  Resp: 16  16 16  $ Temp: 98.3 F (36.8 C) 97.7 F (36.5 C) 98.1 F (36.7 C) 98.7 F (37.1 C)  TempSrc: Oral Oral    SpO2: 100% 100% 98% 98%  Weight:      Height:        Intake/Output Summary (Last 24 hours) at 03/22/2022 [id number] Last data filed  at 03/22/2022 0600 Gross per 24 hour  Intake 2921.36 ml  Output --  Net 2921.36 ml   Filed Weights   03/20/22 2110  Weight: 47.6 kg    Examination: Physical Exam:  Constitutional: WN/WD, NAD and appears calm and comfortable Eyes: PERRL, lids and conjunctivae normal, sclerae anicteric  ENMT: External Ears, Nose appear normal. Grossly normal hearing. Mucous membranes are moist. Posterior pharynx clear of any exudate or lesions. Normal  dentition.  Neck: Appears normal, supple, no cervical masses, normal ROM, no appreciable thyromegaly Respiratory: Clear to auscultation bilaterally, no wheezing, rales, rhonchi or crackles. Normal respiratory effort and patient is not tachypenic. No accessory muscle use.  Cardiovascular: RRR, no murmurs / rubs / gallops. S1 and S2 auscultated. No extremity edema. 2+ pedal pulses. No carotid bruits.  Abdomen: Soft, non-tender, non-distended. No masses palpated. No appreciable hepatosplenomegaly. Bowel sounds positive.  GU: Deferred. Musculoskeletal: No clubbing / cyanosis of digits/nails. No joint deformity upper and lower extremities. Good ROM, no contractures. Normal strength and muscle tone.  Skin: No rashes, lesions, ulcers. No induration; Warm and dry.  Neurologic: CN 2-12 grossly intact with no focal deficits. Sensation intact in all 4 Extremities, DTR normal. Strength 5/5 in all 4. Romberg sign cerebellar reflexes not assessed.  Psychiatric: Normal judgment and insight. Alert and oriented x 3. Normal mood and appropriate affect.   Data Reviewed: I have personally reviewed following labs and imaging studies  CBC: Recent Labs  Lab 03/20/22 0826 03/21/22 0619  WBC 4.9 5.7  NEUTROABS 3.4  --   HGB 10.6* 10.9*  HCT 32.8* 33.8*  MCV 100.6* 101.8*  PLT 186 [id number]*   Basic Metabolic Panel: Recent Labs  Lab 03/20/22 0826 03/21/22 0619  NA 137 136  K 4.0 3.7  CL 102 104  CO2 29 25  GLUCOSE 122* 123*  BUN 21* 12  CREATININE 0.61 0.42*  CALCIUM 9.6 8.7*  MG  --  2.2  PHOS  --  3.9   GFR: Estimated Creatinine Clearance: 60.4 mL/min (A) (by C-G formula based on SCr of 0.42 mg/dL (L)). Liver Function Tests: Recent Labs  Lab 03/20/22 0826  AST 16  ALT 27  ALKPHOS 62  BILITOT 0.4  PROT 7.5  ALBUMIN 4.5   No results for input(s): "LIPASE", "AMYLASE" in the last 168 hours. No results for input(s): "AMMONIA" in the last 168 hours. Coagulation Profile: No results for input(s):  "INR", "PROTIME" in the last 168 hours. Cardiac Enzymes: No results for input(s): "CKTOTAL", "CKMB", "CKMBINDEX", "TROPONINI" in the last 168 hours. BNP (last 3 results) No results for input(s): "PROBNP" in the last 8760 hours. HbA1C: No results for input(s): "HGBA1C" in the last 72 hours. CBG: No results for input(s): "GLUCAP" in the last 168 hours. Lipid Profile: No results for input(s): "CHOL", "HDL", "LDLCALC", "TRIG", "CHOLHDL", "LDLDIRECT" in the last 72 hours. Thyroid Function Tests: No results for input(s): "TSH", "T4TOTAL", "FREET4", "T3FREE", "THYROIDAB" in the last 72 hours. Anemia Panel: No results for input(s): "VITAMINB12", "FOLATE", "FERRITIN", "TIBC", "IRON", "RETICCTPCT" in the last 72 hours. Sepsis Labs: No results for input(s): "PROCALCITON", "LATICACIDVEN" in the last 168 hours.  No results found for this or any previous visit (from the past 240 hour(s)).   Radiology Studies: CT ABDOMEN PELVIS W CONTRAST  Result Date: 03/20/2022 CLINICAL DATA:  Lower abdominal pain EXAM: CT ABDOMEN AND PELVIS WITH CONTRAST TECHNIQUE: Multidetector CT imaging of the abdomen and pelvis was performed using the standard protocol following bolus administration of intravenous contrast. RADIATION DOSE  REDUCTION: This exam was performed according to the departmental dose-optimization program which includes automated exposure control, adjustment of the mA and/or kV according to patient size and/or use of iterative reconstruction technique. CONTRAST:  65m OMNIPAQUE IOHEXOL 300 MG/ML  SOLN COMPARISON:  CT 02/23/2022, 12/20/2021 FINDINGS: Lower chest: Lung bases demonstrate no acute airspace disease. Hepatobiliary: No focal liver abnormality is seen. No gallstones, gallbladder wall thickening, or biliary dilatation. Pancreas: Slight interval increase in size of hypodense mass involving the pancreatic neck and body, this measures 5 x 3.1 cm, previously 4 x 2.3 cm. Extension of mass towards the posterior  gastric antrum, series 2, image 24, potential gastric involvement. Spleen: Normal in size without focal abnormality. Adrenals/Urinary Tract: Adrenal glands are unremarkable. Kidneys are normal, without renal calculi, focal lesion, or hydronephrosis. Bladder is unremarkable. Stomach/Bowel: Moderate fluid distension of the stomach. Mild gastric antral wall thickening as seen on the previous exam. No dilated small bowel. Large stool burden Vascular/Lymphatic: Nonaneurysmal aorta. No suspicious lymph nodes. 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 Reproductive: Uterus and bilateral adnexa are unremarkable. Other: Negative for pelvic effusion or free air. Musculoskeletal: No acute or suspicious osseous abnormality. IMPRESSION: 1. Slight interval increase in size of hypodense mass involving the pancreatic neck and body, now measuring up to 5 cm. Extension of mass towards the posterior gastric antrum with moderate fluid distension of the stomach and mild gastric antral wall thickening, potential gastric involvement by tumor 2. Large stool burden suggesting constipation. There is no evidence for a bowel obstruction. Electronically Signed   By: KDonavan FoilM.D.   On: 03/20/2022 23:49     Scheduled Meds:  enoxaparin (LOVENOX) injection  40 mg Subcutaneous Q24H   famotidine  40 mg Oral BID   gabapentin  100 mg Oral TID   pantoprazole  40 mg Oral QAC breakfast   polyethylene glycol  17 g Oral Daily   senna-docusate  2 tablet Oral BID   sertraline  25 mg Oral Daily   Continuous Infusions:   LOS: 1 day    Time spent: *** minutes spent on chart review, discussion with nursing staff, consultants, updating family and interview/physical exam; more than 50% of that time was spent in counseling and/or coordination of care.   OKerney Elbe DO [facility] Available via Epic secure chat 7am-7pm After these hours, please  refer to coverage provider listed on [email or web address] 03/22/2022, 8:33 AM

## 2022-03-22 NOTE — Progress Notes (Signed)
Pt given d/c instructions and all questions answered. Pt taken via wheelchair by NT to front entrance to meet ride.

## 2022-03-22 NOTE — Progress Notes (Signed)
  Transition of Care (TOC) Screening Note   Patient Details  Name: HAYDN CUSH Date of Birth: 04-17-1965   Transition of Care [facility]) CM/SW Contact:    Lennart Pall, LCSW Phone Number: 03/22/2022, 10:50 AM    Transition of Care Department [facility]) has reviewed patient and no TOC needs have been identified at this time. We will continue to monitor patient advancement through interdisciplinary progression rounds. If new patient transition needs arise, please place a TOC consult.

## 2022-03-27 ENCOUNTER — Other Ambulatory Visit: Payer: Self-pay

## 2022-03-29 ENCOUNTER — Other Ambulatory Visit: Payer: Self-pay | Admitting: Nurse Practitioner

## 2022-03-29 ENCOUNTER — Telehealth: Payer: Self-pay

## 2022-03-29 DIAGNOSIS — C251 Malignant neoplasm of body of pancreas: Secondary | ICD-10-CM

## 2022-03-29 MED ORDER — MORPHINE SULFATE ER 30 MG PO TBCR: 30.0000 mg | EXTENDED_RELEASE_TABLET | Freq: Two times a day (BID) | ORAL | 0 refills | Status: DC

## 2022-03-29 NOTE — Telephone Encounter (Signed)
I called Miss Tara Jensen to let her know [name] was sent to her pharmacy. Patient know to take 1 pill Q12 Per Dr. Benay Spice.

## 2022-03-31 ENCOUNTER — Other Ambulatory Visit: Payer: Self-pay | Admitting: Oncology

## 2022-04-01 ENCOUNTER — Other Ambulatory Visit: Payer: Self-pay

## 2022-04-01 ENCOUNTER — Telehealth: Payer: Self-pay | Admitting: *Deleted

## 2022-04-01 MED ORDER — SORBITOL 70 % PO SOLN: 30.0000 mL | Freq: Every day | ORAL | 2 refills | Status: DC | PRN

## 2022-04-01 NOTE — Telephone Encounter (Signed)
Tara Jensen reports last BM was Saturday and it was not very large. Is very uncomfortable. Took full bottle of magnesium citrate today and Senna Plus at bedtime and Miralax. Drinking prune juice and walking. Does not feel strong enough for chemo tomorrow, but will still come to office to be seen. Per Dr. Benay Spice: Try sorbitol 30 ml every 6 hours till BM and then 30 ml daily. Tara Jensen agrees to try this. Informed her she can receive IV fluids tomorrow if not able to have chemotherapy.

## 2022-04-02 ENCOUNTER — Inpatient Hospital Stay: Payer: BC Managed Care – PPO

## 2022-04-02 ENCOUNTER — Other Ambulatory Visit: Payer: Self-pay | Admitting: *Deleted

## 2022-04-02 ENCOUNTER — Inpatient Hospital Stay: Payer: BC Managed Care – PPO | Admitting: Licensed Clinical Social Worker

## 2022-04-02 ENCOUNTER — Encounter: Payer: Self-pay | Admitting: *Deleted

## 2022-04-02 ENCOUNTER — Inpatient Hospital Stay: Payer: BC Managed Care – PPO | Admitting: Oncology

## 2022-04-02 VITALS — BP 122/69 | HR 87 | Temp 98.2°F | Resp 18 | Ht 70.0 in | Wt 101.6 lb

## 2022-04-02 VITALS — BP 114/69 | HR 74 | Resp 18

## 2022-04-02 DIAGNOSIS — E860 Dehydration: Secondary | ICD-10-CM

## 2022-04-02 DIAGNOSIS — C251 Malignant neoplasm of body of pancreas: Secondary | ICD-10-CM

## 2022-04-02 DIAGNOSIS — Z79899 Other long term (current) drug therapy: Secondary | ICD-10-CM | POA: Diagnosis not present

## 2022-04-02 DIAGNOSIS — Z5111 Encounter for antineoplastic chemotherapy: Secondary | ICD-10-CM | POA: Diagnosis present

## 2022-04-02 DIAGNOSIS — C787 Secondary malignant neoplasm of liver and intrahepatic bile duct: Secondary | ICD-10-CM | POA: Diagnosis present

## 2022-04-02 DIAGNOSIS — Z95828 Presence of other vascular implants and grafts: Secondary | ICD-10-CM

## 2022-04-02 LAB — CBC WITH DIFFERENTIAL (CANCER CENTER ONLY)
Abs Immature Granulocytes: 0 10*3/uL (ref 0.00–0.07)
Basophils Absolute: 0 10*3/uL (ref 0.0–0.1)
Basophils Relative: 0 %
Eosinophils Absolute: 0.1 10*3/uL (ref 0.0–0.5)
Eosinophils Relative: 3 %
HCT: 32.5 % — ABNORMAL LOW (ref 36.0–46.0)
Hemoglobin: 10.7 g/dL — ABNORMAL LOW (ref 12.0–15.0)
Immature Granulocytes: 0 %
Lymphocytes Relative: 23 %
Lymphs Abs: 0.7 10*3/uL (ref 0.7–4.0)
MCH: 33 pg (ref 26.0–34.0)
MCHC: 32.9 g/dL (ref 30.0–36.0)
MCV: 100.3 fL — ABNORMAL HIGH (ref 80.0–100.0)
Monocytes Absolute: 0.3 10*3/uL (ref 0.1–1.0)
Monocytes Relative: 10 %
Neutro Abs: 2 10*3/uL (ref 1.7–7.7)
Neutrophils Relative %: 64 %
Platelet Count: 91 10*3/uL — ABNORMAL LOW (ref 150–400)
RBC: 3.24 MIL/uL — ABNORMAL LOW (ref 3.87–5.11)
RDW: 14.1 % (ref 11.5–15.5)
WBC Count: 3.1 10*3/uL — ABNORMAL LOW (ref 4.0–10.5)
nRBC: 0 % (ref 0.0–0.2)

## 2022-04-02 LAB — CMP (CANCER CENTER ONLY)
ALT: 8 U/L (ref 0–44)
AST: 12 U/L — ABNORMAL LOW (ref 15–41)
Albumin: 4.2 g/dL (ref 3.5–5.0)
Alkaline Phosphatase: 58 U/L (ref 38–126)
Anion gap: 6 (ref 5–15)
BUN: 14 mg/dL (ref 6–20)
CO2: 29 mmol/L (ref 22–32)
Calcium: 9.6 mg/dL (ref 8.9–10.3)
Chloride: 102 mmol/L (ref 98–111)
Creatinine: 0.48 mg/dL (ref 0.44–1.00)
GFR, Estimated: >60 mL/min (ref >60)
Glucose, Bld: 142 mg/dL — ABNORMAL HIGH (ref 70–99)
Potassium: 4 mmol/L (ref 3.5–5.1)
Sodium: 137 mmol/L (ref 135–145)
Total Bilirubin: 0.3 mg/dL (ref 0.3–1.2)
Total Protein: 6.8 g/dL (ref 6.5–8.1)

## 2022-04-02 MED ORDER — SODIUM CHLORIDE 0.9 % IV SOLN: INTRAVENOUS | Status: AC

## 2022-04-02 MED ORDER — HEPARIN SOD (PORK) LOCK FLUSH 100 UNIT/ML IV SOLN
500.0000 [IU] | Freq: Once | INTRAVENOUS | Status: AC
  Administered 2022-04-02: 500 [IU] via INTRAVENOUS

## 2022-04-02 MED ORDER — SODIUM CHLORIDE 0.9% FLUSH
10.0000 mL | INTRAVENOUS | Status: DC | PRN
  Administered 2022-04-02: 10 mL via INTRAVENOUS

## 2022-04-02 NOTE — Progress Notes (Signed)
[place] OFFICE PROGRESS NOTE   Diagnosis: Pancreas cancer  INTERVAL HISTORY:   Ms. Duskey was discharged from the hospital 03/22/2022 after admission with increased pain.  She complains of severe constipation over the past week.  The constipation was relieved yesterday after she took sorbitol and magnesium citrate.  She feels weak today.  Abdominal pain has improved with MS Contin.  She has taken only a few Dilaudid tablets for breakthrough pain.  Her appetite is poor. She continues to have neuropathy symptoms in the extremities.  She is dropping objects. Objective:  Vital signs in last 24 hours:  Blood pressure 122/69, pulse 87, temperature 98.2 F (36.8 C), temperature source Oral, resp. rate 18, height 5' 10"$  (1.778 m), weight 101 lb 9.6 oz (46.1 kg), last menstrual period 11/01/2018, SpO2 100 %.    HEENT: No thrush Resp: Lungs clear bilaterally Cardio: Regular rate and rhythm GI: No hepatosplenomegaly, soft Vascular: No leg edema Neuro: Moderate loss of vibratory sense at the fingertips bilaterally Skin: Hyperpigmentation of the left lower back without skin breakdown  Portacath/PICC-without erythema  Lab Results:  Lab Results  Component Value Date   WBC 3.1 (L) 04/02/2022   HGB 10.7 (L) 04/02/2022   HCT 32.5 (L) 04/02/2022   MCV 100.3 (H) 04/02/2022   PLT 91 (L) 04/02/2022   NEUTROABS 2.0 04/02/2022    CMP  Lab Results  Component Value Date   NA 134 (L) 03/22/2022   K 3.6 03/22/2022   CL 101 03/22/2022   CO2 25 03/22/2022   GLUCOSE 149 (H) 03/22/2022   BUN 12 03/22/2022   CREATININE 0.41 (L) 03/22/2022   CALCIUM 8.5 (L) 03/22/2022   PROT 7.0 03/22/2022   ALBUMIN 3.8 03/22/2022   AST 16 03/22/2022   ALT 20 03/22/2022   ALKPHOS 53 03/22/2022   BILITOT 0.8 03/22/2022   GFRNONAA >60 03/22/2022   GFRAA  12/27/2021    QUESTIONABLE RESULTS, RECOMMEND RECOLLECT TO VERIFY    Lab Results  Component Value Date   CAN199 975 (H) 03/04/2022       Medications: I have reviewed the patient's current medications.   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  Less than 180 degree abutment of the distal celiac, common hepatic, and splenic arteries, less than 180 degree abutment of the SMA CT chest 07/18/2021-no evidence of metastatic disease EUS 07/16/2021-extrinsic impression on the stomach at the posterior wall of the gastric body, no gross lesion in the duodenum, 45 x 36 mm pancreas body mass, abutment of the SMA, celiac trunk, and compression of the splenoportal confluence.  No malignant appearing lymph nodes, numerous venous collaterals adjacent to the portal vein, FNA biopsy-malignant cells consistent with adenocarcinoma, T4N0 by EUS Markedly elevated CA 19-9 [employer] 07/31/2021-K-ras G12R, MSI high-not detected, ATM VUS Cycle 1 FOLFOX 08/08/2021 Cycle 2 FOLFIRINOX 08/22/2021 CT abdomen/pelvis at [facility] 08/31/2021-hypoattenuating liver lesions consistent with metastases, mass in the pancreas neck/body with greater than 100 degree encasement of the celiac axis and common hepatic artery.  Less than 180 degree abutment of the SMA, long segment occlusion of the portal splenic confluence, splenic vein occluded, multiple collateral vessels in the upper abdomen, prominent left periaortic node Cycle 3 FOLFIRINOX 09/05/2021 Cycle 4 FOLFIRINOX 09/19/2021 Cycle 5 FOLFIRINOX 10/03/2021 CTs 10/16/2021-decrease size of primary pancreas mass, hepatic metastases, and a suspicious retroperitoneal  node, resolution of left supraclavicular/low jugular adenopathy compared to a CT chest 07/18/2021.  No evidence of disease progression. Cycle 6 FOLFIRINOX 10/17/2021 Cycle 7 FOLFIRINOX 10/31/2021 Cycle 8 FOLFIRINOX 11/14/2021 Cycle 9 FOLFIRINOX  11/28/2021 Cycle 10 FOLFIRINOX 12/12/2021, oxaliplatin held secondary to neuropathy symptoms CTs 12/20/2021-decrease size of pancreas primary, no evidence of hepatic metastases, no new or progressive disease Cycle 11 FOLFIRINOX 12/27/2021, oxaliplatin held due to neuropathy symptoms Cycle 12 FOLFIRINOX 01/09/2022, oxaliplatin held 01/22/2022-C19-9 higher, 530 Cycle 13 FOLFIRINOX 01/24/2022, oxaliplatin resumed Cycle 14 FOLFIRINOX 02/06/2022 CTs 02/23/2022-increased size of the pancreas body mass new focal gastric wall thickening and loss of fat plane between the mass and the proximal gastric wall, no evidence of new or progressive metastatic disease Cycle 1 gemcitabine/Abraxane 03/06/2022 Cycle 2 gemcitabine/Abraxane 03/20/2022 CT abdomen/pelvis was 03/20/2022-slight interval increase in size of the pancreas neck/body mass with extension to the posterior gastric antrum Cycle 3 gemcitabine/Abraxane 04/04/2022-dose reduction secondary to thrombocytopenia Pain secondary to #1-currently controlled with MS Contin and Dilaudid Celiac plexus block 03/11/2022 Weight loss secondary to #1 History of situational depression Family history of breast cancer 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 Rollerson has metastatic pancreas cancer.  She has completed 2 cycles of salvage therapy with gemcitabine/Abraxane.  She has tolerated the treatment well.  She has persistent neuropathy symptoms, likely secondary to oxaliplatin.  Ms. Schupp is scheduled for cycle 3 gemcitabine/Abraxane today.  She would like to delay today's treatment until 04/04/2022 secondary to malaise.  She had severe constipation earlier in the week and had large bowel movements yesterday following magnesium citrate and sorbitol.  She will receive intravenous fluids at the cancer center today.  She has mild  thrombocytopenia.  Gemcitabine and Abraxane will be dose reduced beginning with this cycle of chemotherapy.  Ms. Sasnett will continue the current pain regimen.  She will use MiraLAX and sorbitol daily for constipation.  She will return for an office visit in the next planned cycle of chemotherapy on 04/17/2022.  Betsy Coder, MD  04/02/2022  9:50 AM

## 2022-04-02 NOTE — Patient Instructions (Signed)

## 2022-04-02 NOTE — Progress Notes (Signed)
Patient seen by Dr. Benay Spice today  Vitals are within treatment parameters.  Labs reviewed by Dr. Benay Spice and are not all within treatment parameters. Platelet count 91,000 . CMP stable.  Per physician team, patient will not be receiving treatment today. Patient not feeling well today. Treatment will be delayed until 04/04/22 and receive 1 liter NS today.

## 2022-04-02 NOTE — Patient Instructions (Signed)
Dehydration, Adult Dehydration is a condition in which there is not enough water or other fluids in the body. This happens when a person loses more fluids than he or she takes in. Important organs, such as the kidneys, brain, and heart, cannot function without a proper amount of fluids. Any loss of fluids from the body can lead to dehydration. Dehydration can be mild, moderate, or severe. It should be treated right away to prevent it from becoming severe. What are the causes? Dehydration may be caused by: Conditions that cause loss of water or other fluids, such as diarrhea, vomiting, or sweating or urinating a lot. Not drinking enough fluids, especially when you are ill or doing activities that require a lot of energy. Other illnesses and conditions, such as fever or infection. Certain medicines, such as medicines that remove excess fluid from the body (diuretics). Lack of safe drinking water. Not being able to get enough water and food. What increases the risk? The following factors may make you more likely to develop this condition: Having a long-term (chronic) illness that has not been treated properly, such as diabetes, heart disease, or kidney disease. Being 65 years of age or older. Having a disability. Living in a place that is high in altitude, where thinner, drier air causes more fluid loss. Doing exercises that put stress on your body for a long time (endurance sports). What are the signs or symptoms? Symptoms of dehydration depend on how severe it is. Mild or moderate dehydration Thirst. Dry lips or dry mouth. Dizziness or light-headedness, especially when standing up from a seated position. Muscle cramps. Dark urine. Urine may be the color of tea. Less urine or tears produced than usual. Headache. Severe dehydration Changes in skin. Your skin may be cold and clammy, blotchy, or pale. Your skin also may not return to normal after being lightly pinched and released. Little or  no tears, urine, or sweat. Changes in vital signs, such as rapid breathing and low blood pressure. Your pulse may be weak or may be faster than 100 beats a minute when you are sitting still. Other changes, such as: Feeling very thirsty. Sunken eyes. Cold hands and feet. Confusion. Being very tired (lethargic) or having trouble waking from sleep. Short-term weight loss. Loss of consciousness. How is this diagnosed? This condition is diagnosed based on your symptoms and a physical exam. You may have blood and urine tests to help confirm the diagnosis. How is this treated? Treatment for this condition depends on how severe it is. Treatment should be started right away. Do not wait until dehydration becomes severe. Severe dehydration is an emergency and needs to be treated in a hospital. Mild or moderate dehydration can be treated at home. You may be asked to: Drink more fluids. Drink an oral rehydration solution (ORS). This drink helps restore proper amounts of fluids and salts and minerals in the blood (electrolytes). Severe dehydration can be treated: With IV fluids. By correcting abnormal levels of electrolytes. This is often done by giving electrolytes through a tube that is passed through your nose and into your stomach (nasogastric tube, or NG tube). By treating the underlying cause of dehydration. Follow these instructions at home: Oral rehydration solution If told by your health care provider, drink an ORS: Make an ORS by following instructions on the package. Start by drinking small amounts, about  cup (120 mL) every 5-10 minutes. Slowly increase how much you drink until you have taken the amount recommended by your health   care provider. Eating and drinking        Drink enough clear fluid to keep your urine pale yellow. If you were told to drink an ORS, finish the ORS first and then start slowly drinking other clear fluids. Drink fluids such as: Water. Do not drink only  water. Doing that can lead to hyponatremia, which is having too little salt (sodium) in the body. Water from ice chips you suck on. Fruit juice that you have added water to (diluted fruit juice). Low-calorie sports drinks. Eat foods that contain a healthy balance of electrolytes, such as bananas, oranges, potatoes, tomatoes, and spinach. Do not drink alcohol. Avoid the following: Drinks that contain a lot of sugar. These include high-calorie sports drinks, fruit juice that is not diluted, and soda. Caffeine. Foods that are greasy or contain a lot of fat or sugar. General instructions Take over-the-counter and prescription medicines only as told by your health care provider. Do not take sodium tablets. Doing that can lead to having too much sodium in the body (hypernatremia). Return to your normal activities as told by your health care provider. Ask your health care provider what activities are safe for you. Keep all follow-up visits as told by your health care provider. This is important. Contact a health care provider if: You have muscle cramps, pain, or discomfort, such as: Pain in your abdomen and the pain gets worse or stays in one area (localizes). Stiff neck. You have a rash. You are more irritable than usual. You are sleepier or have a harder time waking than usual. You feel weak or dizzy. You feel very thirsty. Get help right away if you have: Any symptoms of severe dehydration. Symptoms of vomiting, such as: You cannot eat or drink without vomiting. Vomiting gets worse or does not go away. Vomit includes blood or green matter (bile). Symptoms that get worse with treatment. A fever. A severe headache. Problems with urination or bowel movements, such as: Diarrhea that gets worse or does not go away. Blood in your stool (feces). This may cause stool to look black and tarry. Not urinating, or urinating only a small amount of very dark urine, within 6-8 hours. Trouble  breathing. These symptoms may represent a serious problem that is an emergency. Do not wait to see if the symptoms will go away. Get medical help right away. Call your local emergency services (911 in the [place] Do not drive yourself to the hospital. Summary Dehydration is a condition in which there is not enough water or other fluids in the body. This happens when a person loses more fluids than he or she takes in. Treatment for this condition depends on how severe it is. Treatment should be started right away. Do not wait until dehydration becomes severe. Drink enough clear fluid to keep your urine pale yellow. If you were told to drink an oral rehydration solution (ORS), finish the ORS first and then start slowly drinking other clear fluids. Take over-the-counter and prescription medicines only as told by your health care provider. Get help right away if you have any symptoms of severe dehydration. This information is not intended to replace advice given to you by your health care provider. Make sure you discuss any questions you have with your health care provider. Document Revised: 06/06/2021 Document Reviewed: 09/10/2018 Elsevier Patient Education  2023 [employer]  

## 2022-04-02 NOTE — Progress Notes (Signed)
[place] CSW Progress Note  Holiday representative met with patient to assess needs.  Patient said she had been in the hospital and is very week.  She did not feel well enough to receive her chemotherapy today.  Myrle stated she should be receiving her short term disability soon.  Discussed SSDI through the [facility].  She is going to speak with a lawyer regarding her options and will contact CSW as needed.    [name] Oziel Beitler, LCSW

## 2022-04-03 ENCOUNTER — Encounter: Payer: Self-pay | Admitting: Oncology

## 2022-04-04 ENCOUNTER — Inpatient Hospital Stay: Payer: BC Managed Care – PPO

## 2022-04-04 ENCOUNTER — Encounter: Payer: Self-pay | Admitting: *Deleted

## 2022-04-04 ENCOUNTER — Telehealth: Payer: BC Managed Care – PPO

## 2022-04-04 ENCOUNTER — Encounter: Payer: Self-pay | Admitting: Oncology

## 2022-04-04 VITALS — BP 128/81 | HR 69 | Temp 98.7°F | Resp 18

## 2022-04-04 DIAGNOSIS — C251 Malignant neoplasm of body of pancreas: Secondary | ICD-10-CM

## 2022-04-04 LAB — CMP (CANCER CENTER ONLY)
ALT: 16 U/L (ref 0–44)
AST: 20 U/L (ref 15–41)
Albumin: 4.2 g/dL (ref 3.5–5.0)
Alkaline Phosphatase: 68 U/L (ref 38–126)
Anion gap: 5 (ref 5–15)
BUN: 13 mg/dL (ref 6–20)
CO2: 30 mmol/L (ref 22–32)
Calcium: 9.7 mg/dL (ref 8.9–10.3)
Chloride: 102 mmol/L (ref 98–111)
Creatinine: 0.47 mg/dL (ref 0.44–1.00)
GFR, Estimated: >60 mL/min (ref >60)
Glucose, Bld: 123 mg/dL — ABNORMAL HIGH (ref 70–99)
Potassium: 4 mmol/L (ref 3.5–5.1)
Sodium: 137 mmol/L (ref 135–145)
Total Bilirubin: 0.3 mg/dL (ref 0.3–1.2)
Total Protein: 7.2 g/dL (ref 6.5–8.1)

## 2022-04-04 LAB — CBC WITH DIFFERENTIAL (CANCER CENTER ONLY)
Abs Immature Granulocytes: 0.01 10*3/uL (ref 0.00–0.07)
Basophils Absolute: 0 10*3/uL (ref 0.0–0.1)
Basophils Relative: 0 %
Eosinophils Absolute: 0.2 10*3/uL (ref 0.0–0.5)
Eosinophils Relative: 4 %
HCT: 33.6 % — ABNORMAL LOW (ref 36.0–46.0)
Hemoglobin: 10.9 g/dL — ABNORMAL LOW (ref 12.0–15.0)
Immature Granulocytes: 0 %
Lymphocytes Relative: 23 %
Lymphs Abs: 0.9 10*3/uL (ref 0.7–4.0)
MCH: 32.4 pg (ref 26.0–34.0)
MCHC: 32.4 g/dL (ref 30.0–36.0)
MCV: 100 fL (ref 80.0–100.0)
Monocytes Absolute: 0.3 10*3/uL (ref 0.1–1.0)
Monocytes Relative: 9 %
Neutro Abs: 2.4 10*3/uL (ref 1.7–7.7)
Neutrophils Relative %: 64 %
Platelet Count: 131 10*3/uL — ABNORMAL LOW (ref 150–400)
RBC: 3.36 MIL/uL — ABNORMAL LOW (ref 3.87–5.11)
RDW: 14.3 % (ref 11.5–15.5)
WBC Count: 3.8 10*3/uL — ABNORMAL LOW (ref 4.0–10.5)
nRBC: 0 % (ref 0.0–0.2)

## 2022-04-04 MED ORDER — SODIUM CHLORIDE 0.9 % IV SOLN: Freq: Once | INTRAVENOUS | Status: AC

## 2022-04-04 MED ORDER — PROCHLORPERAZINE MALEATE 10 MG PO TABS
10.0000 mg | ORAL_TABLET | Freq: Once | ORAL | Status: AC
  Administered 2022-04-04: 10 mg via ORAL
  Filled 2022-04-04: qty 1

## 2022-04-04 MED ORDER — PACLITAXEL PROTEIN-BOUND CHEMO INJECTION 100 MG
80.0000 mg/m2 | Freq: Once | INTRAVENOUS | Status: AC
  Administered 2022-04-04: 125 mg via INTRAVENOUS
  Filled 2022-04-04: qty 25

## 2022-04-04 MED ORDER — SODIUM CHLORIDE 0.9 % IV SOLN
750.0000 mg/m2 | Freq: Once | INTRAVENOUS | Status: AC
  Administered 2022-04-04: 1141 mg via INTRAVENOUS
  Filled 2022-04-04: qty 26.28

## 2022-04-04 MED ORDER — HEPARIN SOD (PORK) LOCK FLUSH 100 UNIT/ML IV SOLN
500.0000 [IU] | Freq: Once | INTRAVENOUS | Status: AC | PRN
  Administered 2022-04-04: 500 [IU]

## 2022-04-04 MED ORDER — SODIUM CHLORIDE 0.9% FLUSH
10.0000 mL | INTRAVENOUS | Status: DC | PRN
  Administered 2022-04-04: 10 mL

## 2022-04-04 MED ORDER — PREDNISONE 10 MG PO TABS: 20.0000 mg | ORAL_TABLET | Freq: Every day | ORAL | 0 refills | Status: DC

## 2022-04-04 NOTE — Progress Notes (Signed)
[facility] referral placed and called. The team will be reaching out to pt within next few days to set up a home visit.   She is interested in goal setting.

## 2022-04-04 NOTE — Progress Notes (Signed)
Per MD Benay Spice, ok to proceed with treatment today with CMP pending

## 2022-04-04 NOTE — Patient Instructions (Signed)
[facility]   Discharge Instructions: Thank you for choosing [facility] to provide your oncology and hematology care.   If you have a lab appointment with the [facility], please go directly to the [facility] and check in at the registration area.   Wear comfortable clothing and clothing appropriate for easy access to any Portacath or PICC line.   We strive to give you quality time with your provider. You may need to reschedule your appointment if you arrive late (15 or more minutes).  Arriving late affects you and other patients whose appointments are after yours.  Also, if you miss three or more appointments without notifying the office, you may be dismissed from the clinic at the provider's discretion.      For prescription refill requests, have your pharmacy contact our office and allow 72 hours for refills to be completed.    Today you received the following chemotherapy and/or immunotherapy agents Paclitaxel-protein bound (ABRAXANE) & Gemcitabine (GEMZAR).      To help prevent nausea and vomiting after your treatment, we encourage you to take your nausea medication as directed.  BELOW ARE SYMPTOMS THAT SHOULD BE REPORTED IMMEDIATELY: *FEVER GREATER THAN 100.4 F (38 C) OR HIGHER *CHILLS OR SWEATING *NAUSEA AND VOMITING THAT IS NOT CONTROLLED WITH YOUR NAUSEA MEDICATION *UNUSUAL SHORTNESS OF BREATH *UNUSUAL BRUISING OR BLEEDING *URINARY PROBLEMS (pain or burning when urinating, or frequent urination) *BOWEL PROBLEMS (unusual diarrhea, constipation, pain near the anus) TENDERNESS IN MOUTH AND THROAT WITH OR WITHOUT PRESENCE OF ULCERS (sore throat, sores in mouth, or a toothache) UNUSUAL RASH, SWELLING OR PAIN  UNUSUAL VAGINAL DISCHARGE OR ITCHING   Items with * indicate a potential emergency and should be followed up as soon as possible or go to the Emergency Department if any problems should occur.  Please show the  CHEMOTHERAPY ALERT CARD or IMMUNOTHERAPY ALERT CARD at check-in to the Emergency Department and triage nurse.  Should you have questions after your visit or need to cancel or reschedule your appointment, please contact [facility]  Dept: [phone]  and follow the prompts.  Office hours are 8:00 a.m. to 4:30 p.m. Monday - Friday. Please note that voicemails left after 4:00 p.m. may not be returned until the following business day.  We are closed weekends and major holidays. You have access to a nurse at all times for urgent questions. Please call the main number to the clinic Dept: [phone] and follow the prompts.   For any non-urgent questions, you may also contact your provider using MyChart. We now offer e-Visits for anyone 28 and older to request care online for non-urgent symptoms. For details visit [email or web address].   Also download the MyChart app! Go to the app store, search "MyChart", open the app, select [facility], and log in with your MyChart username and password.  Paclitaxel Nanoparticle Albumin-Bound Injection What is this medication? NANOPARTICLE ALBUMIN-BOUND PACLITAXEL (Na no PAHR ti kuhl al BYOO muhn-bound PAK li TAX el) treats some types of cancer. It works by slowing down the growth of cancer cells. This medicine may be used for other purposes; ask your health care provider or pharmacist if you have questions. COMMON BRAND NAME(S): Abraxane What should I tell my care team before I take this medication? They need to know if you have any of these conditions: Liver disease Low white blood cell levels An unusual or allergic reaction to paclitaxel, albumin, other medications, foods,  dyes, or preservatives If you or your partner are pregnant or trying to get pregnant Breast-feeding How should I use this medication? This medication is injected into a vein. It is given by your care team in a hospital or clinic setting. Talk to your  care team about the use of this medication in children. Special care may be needed. Overdosage: If you think you have taken too much of this medicine contact a poison control center or emergency room at once. NOTE: This medicine is only for you. Do not share this medicine with others. What if I miss a dose? Keep appointments for follow-up doses. It is important not to miss your dose. Call your care team if you are unable to keep an appointment. What may interact with this medication? Other medications may affect the way this medication works. Talk with your care team about all of the medications you take. They may suggest changes to your treatment plan to lower the risk of side effects and to make sure your medications work as intended. This list may not describe all possible interactions. Give your health care provider a list of all the medicines, herbs, non-prescription drugs, or dietary supplements you use. Also tell them if you smoke, drink alcohol, or use illegal drugs. Some items may interact with your medicine. What should I watch for while using this medication? Your condition will be monitored carefully while you are receiving this medication. You may need blood work while taking this medication. This medication may make you feel generally unwell. This is not uncommon as chemotherapy can affect healthy cells as well as cancer cells. Report any side effects. Continue your course of treatment even though you feel ill unless your care team tells you to stop. This medication can cause serious allergic reactions. To reduce the risk, your care team may give you other medications to take before receiving this one. Be sure to follow the directions from your care team. This medication may increase your risk of getting an infection. Call your care team for advice if you get a fever, chills, sore throat, or other symptoms of a cold or flu. Do not treat yourself. Try to avoid being around people who are  sick. This medication may increase your risk to bruise or bleed. Call your care team if you notice any unusual bleeding. Be careful brushing or flossing your teeth or using a toothpick because you may get an infection or bleed more easily. If you have any dental work done, tell your dentist you are receiving this medication. Talk to your care team if you or your partner may be pregnant. Serious birth defects can occur if you take this medication during pregnancy and for 6 months after the last dose. You will need a negative pregnancy test before starting this medication. Contraception is recommended while taking this medication and for 6 months after the last dose. Your care team can help you find the option that works for you. If your partner can get pregnant, use a condom during sex while taking this medication and for 3 months after the last dose. Do not breastfeed while taking this medication and for 2 weeks after the last dose. This medication may cause infertility. Talk to your care team if you are concerned about your fertility. What side effects may I notice from receiving this medication? Side effects that you should report to your care team as soon as possible: Allergic reactions--skin rash, itching, hives, swelling of the face, lips, tongue, or  throat Dry cough, shortness of breath or trouble breathing Infection--fever, chills, cough, sore throat, wounds that don't heal, pain or trouble when passing urine, general feeling of discomfort or being unwell Low red blood cell level--unusual weakness or fatigue, dizziness, headache, trouble breathing Pain, tingling, or numbness in the hands or feet Stomach pain, unusual weakness or fatigue, nausea, vomiting, diarrhea, or fever that lasts longer than expected Unusual bruising or bleeding Side effects that usually do not require medical attention (report to your care team if they continue or are bothersome): Diarrhea Fatigue Hair loss Loss of  appetite Nausea Vomiting This list may not describe all possible side effects. Call your doctor for medical advice about side effects. You may report side effects to FDA at 1-800-FDA-1088. Where should I keep my medication? This medication is given in a hospital or clinic. It will not be stored at home. NOTE: This sheet is a summary. It may not cover all possible information. If you have questions about this medicine, talk to your doctor, pharmacist, or health care provider.  2023 Elsevier/Gold Standard (2007-03-21 00:00:00)   Gemcitabine Injection What is this medication? GEMCITABINE (jem SYE ta been) treats some types of cancer. It works by slowing down the growth of cancer cells. This medicine may be used for other purposes; ask your health care provider or pharmacist if you have questions. COMMON BRAND NAME(S): Gemzar, Infugem What should I tell my care team before I take this medication? They need to know if you have any of these conditions: Blood disorders Infection Kidney disease Liver disease Lung or breathing disease, such as asthma or COPD Recent or ongoing radiation therapy An unusual or allergic reaction to gemcitabine, other medications, foods, dyes, or preservatives If you or your partner are pregnant or trying to get pregnant Breast-feeding How should I use this medication? This medication is injected into a vein. It is given by your care team in a hospital or clinic setting. Talk to your care team about the use of this medication in children. Special care may be needed. Overdosage: If you think you have taken too much of this medicine contact a poison control center or emergency room at once. NOTE: This medicine is only for you. Do not share this medicine with others. What if I miss a dose? Keep appointments for follow-up doses. It is important not to miss your dose. Call your care team if you are unable to keep an appointment. What may interact with this  medication? Interactions have not been studied. This list may not describe all possible interactions. Give your health care provider a list of all the medicines, herbs, non-prescription drugs, or dietary supplements you use. Also tell them if you smoke, drink alcohol, or use illegal drugs. Some items may interact with your medicine. What should I watch for while using this medication? Your condition will be monitored carefully while you are receiving this medication. This medication may make you feel generally unwell. This is not uncommon, as chemotherapy can affect healthy cells as well as cancer cells. Report any side effects. Continue your course of treatment even though you feel ill unless your care team tells you to stop. In some cases, you may be given additional medications to help with side effects. Follow all directions for their use. This medication may increase your risk of getting an infection. Call your care team for advice if you get a fever, chills, sore throat, or other symptoms of a cold or flu. Do not treat yourself. Try  to avoid being around people who are sick. This medication may increase your risk to bruise or bleed. Call your care team if you notice any unusual bleeding. Be careful brushing or flossing your teeth or using a toothpick because you may get an infection or bleed more easily. If you have any dental work done, tell your dentist you are receiving this medication. Avoid taking medications that contain aspirin, acetaminophen, ibuprofen, naproxen, or ketoprofen unless instructed by your care team. These medications may hide a fever. Talk to your care team if you or your partner wish to become pregnant or think you might be pregnant. This medication can cause serious birth defects if taken during pregnancy and for 6 months after the last dose. A negative pregnancy test is required before starting this medication. A reliable form of contraception is recommended while taking  this medication and for 6 months after the last dose. Talk to your care team about effective forms of contraception. Do not father a child while taking this medication and for 3 months after the last dose. Use a condom while having sex during this time period. Do not breastfeed while taking this medication and for at least 1 week after the last dose. This medication may cause infertility. Talk to your care team if you are concerned about your fertility. What side effects may I notice from receiving this medication? Side effects that you should report to your care team as soon as possible: Allergic reactions--skin rash, itching, hives, swelling of the face, lips, tongue, or throat Capillary leak syndrome--stomach or muscle pain, unusual weakness or fatigue, feeling faint or lightheaded, decrease in the amount of urine, swelling of the ankles, hands, or feet, trouble breathing Infection--fever, chills, cough, sore throat, wounds that don't heal, pain or trouble when passing urine, general feeling of discomfort or being unwell Liver injury--right upper belly pain, loss of appetite, nausea, light-colored stool, dark yellow or brown urine, yellowing skin or eyes, unusual weakness or fatigue Low red blood cell level--unusual weakness or fatigue, dizziness, headache, trouble breathing Lung injury--shortness of breath or trouble breathing, cough, spitting up blood, chest pain, fever Stomach pain, bloody diarrhea, pale skin, unusual weakness or fatigue, decrease in the amount of urine, which may be signs of hemolytic uremic syndrome Sudden and severe headache, confusion, change in vision, seizures, which may be signs of posterior reversible encephalopathy syndrome (PRES) Unusual bruising or bleeding Side effects that usually do not require medical attention (report to your care team if they continue or are bothersome): Diarrhea Drowsiness Hair loss Nausea Pain, redness, or swelling with sores inside the  mouth or throat Vomiting This list may not describe all possible side effects. Call your doctor for medical advice about side effects. You may report side effects to FDA at 1-800-FDA-1088. Where should I keep my medication? This medication is given in a hospital or clinic. It will not be stored at home. NOTE: This sheet is a summary. It may not cover all possible information. If you have questions about this medicine, talk to your doctor, pharmacist, or health care provider.  2023 Elsevier/Gold Standard (2007-03-21 00:00:00)

## 2022-04-05 ENCOUNTER — Inpatient Hospital Stay: Payer: BC Managed Care – PPO

## 2022-04-05 NOTE — Progress Notes (Signed)
[facility] CSW Progress Note  Clinical Education officer, museum contacted patient by phone to discuss disability.  She would like to contact a lawyer to clarify information about her situation before she proceeds with applying with the [facility].     [name] Genieve Ramaswamy, LCSW

## 2022-04-08 ENCOUNTER — Other Ambulatory Visit: Payer: BC Managed Care – PPO

## 2022-04-08 ENCOUNTER — Encounter: Payer: Self-pay | Admitting: Oncology

## 2022-04-08 DIAGNOSIS — Z515 Encounter for palliative care: Secondary | ICD-10-CM

## 2022-04-09 NOTE — Progress Notes (Signed)
COMMUNITY PALLIATIVE CARE SW NOTE  PATIENT NAME: Tara Jensen DOB: 1965-12-08 MRN: [id number]  PRIMARY CARE PROVIDER: Chesley Noon, MD  RESPONSIBLE PARTY:  Acct ID - Guarantor Home Phone Work Phone Relationship Acct Type  [id number] - [place],[phone] Self P/F     [address], [place], [place] [address]   Initial Palliative Care Clinical Social Work Telephonic Encounter  PC SW completed an initial palliative care encounter with patient. Patient was provided education regarding palliative care services, visit frequency and role in patient's care. Patient was very open to palliative care support and visits. She report that she is single, and lost her only son 2 1/2 years ago. Her support network is very limited. 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 She is unsure of what her next steps are, but is concerned about her finances. SW provided education regarding public assistance as requested.  Patient is looking forward to ongoing support and visits from palliative care team.  A follow-up visit was scheduled for 04/18/22 @ 11 am.    Social History   Tobacco Use   Smoking status: Never   Smokeless tobacco: Never  Substance Use Topics   Alcohol use: Not Currently    CODE STATUS: Full Code ADVANCED DIRECTIVES: Yes MOST FORM COMPLETE:  No HOSPICE EDUCATION PROVIDED: No  Duration of encounter and documentation: 30 minutes  [employer], LCSW

## 2022-04-10 ENCOUNTER — Other Ambulatory Visit: Payer: Self-pay | Admitting: Oncology

## 2022-04-11 ENCOUNTER — Encounter: Payer: Self-pay | Admitting: *Deleted

## 2022-04-11 NOTE — Progress Notes (Signed)
PATIENT NAVIGATOR PROGRESS NOTE  Name: Tara Jensen Date: 04/11/2022 MRN: [id number]  DOB: [birth year]   Reason for visit:  Telephone communication  Comments:  Received communication from pt on 2/26 that she had not had BM since previous Monday despite taking miralax every day and sorbital once a day as well.  Discussed that she needed to have a BM daily since pain medication can constipate.  Advised taking 1/2 magnesium citrate  On Tuesday 2/27 received message that she still had not had BM, then advised to take miralax BID in addition to Sorbitol every 6 hours until BM Clarified that it is 55m every 6 hours of Sorbitol  Patient communicated on Thursday that she had diarrhea x1 on Wednesday night.  Instructed patient to monitor BMs on Thursday and that she may take Miralax x1 to keep bowels moving.   To call the office if she has 3-4 or more diarrhea stools in a day.     Time spent counseling/coordinating care: 45-60 minutes

## 2022-04-12 ENCOUNTER — Telehealth: Payer: Self-pay

## 2022-04-12 NOTE — Telephone Encounter (Signed)
Miss Foley call and states she is having severe stomach pain and her last big bowel movement was about 12 days ago.  Wednesday she had a small bowel movement. Pain is rate as a 10. Cramping in the lower stomach. Per Lattie Haw she is in severe pain and concerned for an obstruction then she needs to proceed to the ER, that will expedite her evaluation. Patient is only taking miralax once a day, Sorbitol q. 6 hours and senna daily without any success. No nauseated or vomiting.  Patient states she will be going to the [facility] ed.

## 2022-04-15 ENCOUNTER — Inpatient Hospital Stay (HOSPITAL_BASED_OUTPATIENT_CLINIC_OR_DEPARTMENT_OTHER)
Admission: EM | Admit: 2022-04-15 | Discharge: 2022-04-25 | DRG: 947 | Disposition: A | Discharge status: Home / Self Care | Payer: BC Managed Care – PPO | Attending: Internal Medicine | Admitting: Internal Medicine

## 2022-04-15 ENCOUNTER — Encounter (HOSPITAL_BASED_OUTPATIENT_CLINIC_OR_DEPARTMENT_OTHER): Payer: Self-pay

## 2022-04-15 ENCOUNTER — Other Ambulatory Visit: Payer: Self-pay

## 2022-04-15 ENCOUNTER — Telehealth: Payer: Self-pay | Admitting: Nurse Practitioner

## 2022-04-15 ENCOUNTER — Emergency Department (HOSPITAL_BASED_OUTPATIENT_CLINIC_OR_DEPARTMENT_OTHER): Payer: BC Managed Care – PPO

## 2022-04-15 DIAGNOSIS — G893 Neoplasm related pain (acute) (chronic): Secondary | ICD-10-CM

## 2022-04-15 DIAGNOSIS — R1013 Epigastric pain: Secondary | ICD-10-CM | POA: Diagnosis not present

## 2022-04-15 DIAGNOSIS — R64 Cachexia: Secondary | ICD-10-CM | POA: Diagnosis present

## 2022-04-15 DIAGNOSIS — T451X5A Adverse effect of antineoplastic and immunosuppressive drugs, initial encounter: Secondary | ICD-10-CM | POA: Diagnosis present

## 2022-04-15 DIAGNOSIS — C787 Secondary malignant neoplasm of liver and intrahepatic bile duct: Secondary | ICD-10-CM | POA: Diagnosis not present

## 2022-04-15 DIAGNOSIS — D61818 Other pancytopenia: Secondary | ICD-10-CM | POA: Diagnosis present

## 2022-04-15 DIAGNOSIS — R634 Abnormal weight loss: Secondary | ICD-10-CM

## 2022-04-15 DIAGNOSIS — C259 Malignant neoplasm of pancreas, unspecified: Secondary | ICD-10-CM

## 2022-04-15 DIAGNOSIS — T402X5A Adverse effect of other opioids, initial encounter: Secondary | ICD-10-CM | POA: Diagnosis present

## 2022-04-15 DIAGNOSIS — R630 Anorexia: Secondary | ICD-10-CM

## 2022-04-15 DIAGNOSIS — F4321 Adjustment disorder with depressed mood: Secondary | ICD-10-CM | POA: Diagnosis present

## 2022-04-15 DIAGNOSIS — K5903 Drug induced constipation: Secondary | ICD-10-CM | POA: Diagnosis present

## 2022-04-15 DIAGNOSIS — G620 Drug-induced polyneuropathy: Secondary | ICD-10-CM | POA: Diagnosis present

## 2022-04-15 DIAGNOSIS — M549 Dorsalgia, unspecified: Secondary | ICD-10-CM | POA: Diagnosis present

## 2022-04-15 DIAGNOSIS — Z515 Encounter for palliative care: Secondary | ICD-10-CM

## 2022-04-15 DIAGNOSIS — Z681 Body mass index (BMI) 19 or less, adult: Secondary | ICD-10-CM

## 2022-04-15 DIAGNOSIS — C251 Malignant neoplasm of body of pancreas: Secondary | ICD-10-CM | POA: Diagnosis present

## 2022-04-15 DIAGNOSIS — F419 Anxiety disorder, unspecified: Secondary | ICD-10-CM | POA: Diagnosis present

## 2022-04-15 DIAGNOSIS — F32A Depression, unspecified: Secondary | ICD-10-CM | POA: Diagnosis present

## 2022-04-15 DIAGNOSIS — E43 Unspecified severe protein-calorie malnutrition: Secondary | ICD-10-CM | POA: Diagnosis present

## 2022-04-15 DIAGNOSIS — K219 Gastro-esophageal reflux disease without esophagitis: Secondary | ICD-10-CM | POA: Diagnosis present

## 2022-04-15 DIAGNOSIS — Z79899 Other long term (current) drug therapy: Secondary | ICD-10-CM

## 2022-04-15 DIAGNOSIS — Z87440 Personal history of urinary (tract) infections: Secondary | ICD-10-CM

## 2022-04-15 DIAGNOSIS — Z79891 Long term (current) use of opiate analgesic: Secondary | ICD-10-CM

## 2022-04-15 DIAGNOSIS — Z66 Do not resuscitate: Secondary | ICD-10-CM | POA: Diagnosis present

## 2022-04-15 DIAGNOSIS — D649 Anemia, unspecified: Secondary | ICD-10-CM

## 2022-04-15 DIAGNOSIS — R52 Pain, unspecified: Secondary | ICD-10-CM | POA: Diagnosis present

## 2022-04-15 DIAGNOSIS — R530 Neoplastic (malignant) related fatigue: Secondary | ICD-10-CM | POA: Diagnosis present

## 2022-04-15 DIAGNOSIS — Z7952 Long term (current) use of systemic steroids: Secondary | ICD-10-CM

## 2022-04-15 DIAGNOSIS — Z818 Family history of other mental and behavioral disorders: Secondary | ICD-10-CM

## 2022-04-15 DIAGNOSIS — Z803 Family history of malignant neoplasm of breast: Secondary | ICD-10-CM

## 2022-04-15 LAB — CBC
HCT: 19.1 % — ABNORMAL LOW (ref 36.0–46.0)
Hemoglobin: 6.2 g/dL — CL (ref 12.0–15.0)
MCH: 32.1 pg (ref 26.0–34.0)
MCHC: 32.5 g/dL (ref 30.0–36.0)
MCV: 99 fL (ref 80.0–100.0)
Platelets: 76 10*3/uL — ABNORMAL LOW (ref 150–400)
RBC: 1.93 MIL/uL — ABNORMAL LOW (ref 3.87–5.11)
RDW: 13.4 % (ref 11.5–15.5)
WBC: 3.2 10*3/uL — ABNORMAL LOW (ref 4.0–10.5)
nRBC: 0 % (ref 0.0–0.2)

## 2022-04-15 LAB — HEPATIC FUNCTION PANEL
ALT: 11 U/L (ref 0–44)
AST: 11 U/L — ABNORMAL LOW (ref 15–41)
Albumin: 3.9 g/dL (ref 3.5–5.0)
Alkaline Phosphatase: 49 U/L (ref 38–126)
Bilirubin, Direct: 0.1 mg/dL (ref 0.0–0.2)
Indirect Bilirubin: 0.2 mg/dL — ABNORMAL LOW (ref 0.3–0.9)
Total Bilirubin: 0.3 mg/dL (ref 0.3–1.2)
Total Protein: 7 g/dL (ref 6.5–8.1)

## 2022-04-15 LAB — MAGNESIUM: Magnesium: 1.1 mg/dL — ABNORMAL LOW (ref 1.7–2.4)

## 2022-04-15 LAB — CBC WITH DIFFERENTIAL/PLATELET
Abs Immature Granulocytes: 0.01 10*3/uL (ref 0.00–0.07)
Basophils Absolute: 0 10*3/uL (ref 0.0–0.1)
Basophils Relative: 0 %
Eosinophils Absolute: 0 10*3/uL (ref 0.0–0.5)
Eosinophils Relative: 1 %
HCT: 19.4 % — ABNORMAL LOW (ref 36.0–46.0)
Hemoglobin: 6.1 g/dL — CL (ref 12.0–15.0)
Immature Granulocytes: 0 %
Lymphocytes Relative: 17 %
Lymphs Abs: 0.5 10*3/uL — ABNORMAL LOW (ref 0.7–4.0)
MCH: 31.6 pg (ref 26.0–34.0)
MCHC: 31.4 g/dL (ref 30.0–36.0)
MCV: 100.5 fL — ABNORMAL HIGH (ref 80.0–100.0)
Monocytes Absolute: 0.4 10*3/uL (ref 0.1–1.0)
Monocytes Relative: 12 %
Neutro Abs: 2.2 10*3/uL (ref 1.7–7.7)
Neutrophils Relative %: 70 %
Platelets: 76 10*3/uL — ABNORMAL LOW (ref 150–400)
RBC: 1.93 MIL/uL — ABNORMAL LOW (ref 3.87–5.11)
RDW: 13.6 % (ref 11.5–15.5)
WBC: 3.2 10*3/uL — ABNORMAL LOW (ref 4.0–10.5)
nRBC: 0 % (ref 0.0–0.2)

## 2022-04-15 LAB — RETICULOCYTES
Immature Retic Fract: 13.6 % (ref 2.3–15.9)
RBC.: 3.27 MIL/uL — ABNORMAL LOW (ref 3.87–5.11)
Retic Count, Absolute: 67.4 10*3/uL (ref 19.0–186.0)
Retic Ct Pct: 2.1 % (ref 0.4–3.1)

## 2022-04-15 LAB — COMPREHENSIVE METABOLIC PANEL
ALT: 7 U/L (ref 0–44)
AST: 6 U/L — ABNORMAL LOW (ref 15–41)
Albumin: 2.4 g/dL — ABNORMAL LOW (ref 3.5–5.0)
Alkaline Phosphatase: 30 U/L — ABNORMAL LOW (ref 38–126)
Anion gap: 5 (ref 5–15)
BUN: 10 mg/dL (ref 6–20)
CO2: 19 mmol/L — ABNORMAL LOW (ref 22–32)
Calcium: 5.5 mg/dL — CL (ref 8.9–10.3)
Chloride: 121 mmol/L — ABNORMAL HIGH (ref 98–111)
Creatinine, Ser: <0.3 mg/dL — ABNORMAL LOW (ref 0.44–1.00)
Glucose, Bld: 83 mg/dL (ref 70–99)
Potassium: 2.1 mmol/L — CL (ref 3.5–5.1)
Sodium: 145 mmol/L (ref 135–145)
Total Bilirubin: 0.2 mg/dL — ABNORMAL LOW (ref 0.3–1.2)
Total Protein: 4.2 g/dL — ABNORMAL LOW (ref 6.5–8.1)

## 2022-04-15 LAB — TECHNOLOGIST SMEAR REVIEW

## 2022-04-15 LAB — BASIC METABOLIC PANEL
Anion gap: 7 (ref 5–15)
BUN: 14 mg/dL (ref 6–20)
CO2: 30 mmol/L (ref 22–32)
Calcium: 9.5 mg/dL (ref 8.9–10.3)
Chloride: 100 mmol/L (ref 98–111)
Creatinine, Ser: 0.43 mg/dL — ABNORMAL LOW (ref 0.44–1.00)
GFR, Estimated: >60 mL/min (ref >60)
Glucose, Bld: 121 mg/dL — ABNORMAL HIGH (ref 70–99)
Potassium: 3.7 mmol/L (ref 3.5–5.1)
Sodium: 137 mmol/L (ref 135–145)

## 2022-04-15 LAB — LACTATE DEHYDROGENASE: LDH: 103 U/L (ref 98–192)

## 2022-04-15 LAB — ABO/RH: ABO/RH(D): O NEG

## 2022-04-15 LAB — LIPASE, BLOOD: Lipase: <10 U/L — ABNORMAL LOW (ref 11–51)

## 2022-04-15 LAB — PREPARE RBC (CROSSMATCH)

## 2022-04-15 LAB — LACTIC ACID, PLASMA: Lactic Acid, Venous: 0.6 mmol/L (ref 0.5–1.9)

## 2022-04-15 LAB — OCCULT BLOOD X 1 CARD TO LAB, STOOL: Fecal Occult Bld: NEGATIVE

## 2022-04-15 MED ORDER — HYDROMORPHONE HCL 1 MG/ML IJ SOLN
1.0000 mg | Freq: Once | INTRAMUSCULAR | Status: AC
  Administered 2022-04-15: 1 mg via INTRAVENOUS
  Filled 2022-04-15: qty 1

## 2022-04-15 MED ORDER — SODIUM CHLORIDE 0.9 % IV SOLN
12.5000 mg | Freq: Four times a day (QID) | INTRAVENOUS | Status: DC | PRN
  Administered 2022-04-15 – 2022-04-18 (×2): 12.5 mg via INTRAVENOUS
  Filled 2022-04-15: qty 0.5
  Filled 2022-04-15: qty 12.5

## 2022-04-15 MED ORDER — PREDNISONE 20 MG PO TABS: 20.0000 mg | ORAL_TABLET | Freq: Every day | ORAL | Status: DC

## 2022-04-15 MED ORDER — LACTATED RINGERS IV BOLUS
1000.0000 mL | Freq: Once | INTRAVENOUS | Status: AC
  Administered 2022-04-15: 1000 mL via INTRAVENOUS

## 2022-04-15 MED ORDER — SERTRALINE HCL 25 MG PO TABS
25.0000 mg | ORAL_TABLET | Freq: Every day | ORAL | Status: DC
  Administered 2022-04-15 – 2022-04-16 (×2): 25 mg via ORAL
  Filled 2022-04-15 (×2): qty 1

## 2022-04-15 MED ORDER — ONDANSETRON HCL 4 MG/2ML IJ SOLN
4.0000 mg | Freq: Once | INTRAMUSCULAR | Status: AC
  Administered 2022-04-15: 4 mg via INTRAVENOUS

## 2022-04-15 MED ORDER — ONDANSETRON HCL 4 MG/2ML IJ SOLN
INTRAMUSCULAR | Status: AC
  Filled 2022-04-15: qty 2

## 2022-04-15 MED ORDER — LIDOCAINE-PRILOCAINE 2.5-2.5 % EX CREA: 1.0000 | TOPICAL_CREAM | CUTANEOUS | Status: DC | PRN

## 2022-04-15 MED ORDER — PANTOPRAZOLE SODIUM 40 MG IV SOLR
40.0000 mg | Freq: Two times a day (BID) | INTRAVENOUS | Status: DC
  Administered 2022-04-15 – 2022-04-16 (×3): 40 mg via INTRAVENOUS
  Filled 2022-04-15 (×3): qty 10

## 2022-04-15 MED ORDER — HYDROMORPHONE HCL 1 MG/ML IJ SOLN
1.0000 mg | INTRAMUSCULAR | Status: DC | PRN
  Administered 2022-04-15 – 2022-04-16 (×5): 1 mg via INTRAVENOUS
  Filled 2022-04-15 (×5): qty 1

## 2022-04-15 MED ORDER — CHLORHEXIDINE GLUCONATE CLOTH 2 % EX PADS
6.0000 | MEDICATED_PAD | Freq: Every day | CUTANEOUS | Status: DC
  Administered 2022-04-15 – 2022-04-24 (×9): 6 via TOPICAL

## 2022-04-15 MED ORDER — SODIUM CHLORIDE 0.9% IV SOLUTION: Freq: Once | INTRAVENOUS | Status: DC

## 2022-04-15 MED ORDER — MAGNESIUM SULFATE 4 GM/100ML IV SOLN
4.0000 g | Freq: Once | INTRAVENOUS | Status: AC
  Administered 2022-04-15: 4 g via INTRAVENOUS
  Filled 2022-04-15: qty 100

## 2022-04-15 MED ORDER — IOHEXOL 350 MG/ML SOLN
100.0000 mL | Freq: Once | INTRAVENOUS | Status: AC | PRN
  Administered 2022-04-15: 50 mL via INTRAVENOUS

## 2022-04-15 MED ORDER — LACTULOSE 10 GM/15ML PO SOLN
20.0000 g | Freq: Two times a day (BID) | ORAL | Status: DC
  Administered 2022-04-15 – 2022-04-16 (×3): 20 g via ORAL
  Filled 2022-04-15 (×3): qty 30

## 2022-04-15 MED ORDER — PREDNISONE 20 MG PO TABS
20.0000 mg | ORAL_TABLET | Freq: Every day | ORAL | Status: DC
  Administered 2022-04-16 – 2022-04-19 (×4): 20 mg via ORAL
  Filled 2022-04-15 (×4): qty 1

## 2022-04-15 MED ORDER — ENSURE ENLIVE PO LIQD
237.0000 mL | Freq: Two times a day (BID) | ORAL | Status: DC
  Administered 2022-04-16 – 2022-04-24 (×15): 237 mL via ORAL

## 2022-04-15 MED ORDER — FAMOTIDINE 20 MG PO TABS: 40.0000 mg | ORAL_TABLET | Freq: Two times a day (BID) | ORAL | Status: DC

## 2022-04-15 MED ORDER — ADULT MULTIVITAMIN W/MINERALS CH
1.0000 | ORAL_TABLET | Freq: Every day | ORAL | Status: DC
  Administered 2022-04-16 – 2022-04-25 (×10): 1 via ORAL
  Filled 2022-04-15 (×10): qty 1

## 2022-04-15 MED ORDER — NALOXEGOL OXALATE 12.5 MG PO TABS
12.5000 mg | ORAL_TABLET | Freq: Every day | ORAL | Status: DC
  Filled 2022-04-15: qty 1

## 2022-04-15 MED ORDER — POTASSIUM CHLORIDE CRYS ER 20 MEQ PO TBCR
40.0000 meq | EXTENDED_RELEASE_TABLET | Freq: Three times a day (TID) | ORAL | Status: DC
  Administered 2022-04-15: 40 meq via ORAL
  Filled 2022-04-15: qty 2

## 2022-04-15 MED ORDER — MORPHINE SULFATE ER 15 MG PO TBCR
30.0000 mg | EXTENDED_RELEASE_TABLET | Freq: Two times a day (BID) | ORAL | Status: DC
  Administered 2022-04-15: 30 mg via ORAL
  Filled 2022-04-15: qty 2

## 2022-04-15 MED ORDER — ORAL CARE MOUTH RINSE: 15.0000 mL | OROMUCOSAL | Status: DC | PRN

## 2022-04-15 MED ORDER — PANTOPRAZOLE SODIUM 40 MG PO TBEC: 40.0000 mg | DELAYED_RELEASE_TABLET | Freq: Every day | ORAL | Status: DC

## 2022-04-15 MED ORDER — GABAPENTIN 300 MG PO CAPS
300.0000 mg | ORAL_CAPSULE | Freq: Three times a day (TID) | ORAL | Status: DC
  Administered 2022-04-15: 300 mg via ORAL
  Filled 2022-04-15: qty 1

## 2022-04-15 MED ORDER — GABAPENTIN 300 MG PO CAPS: 300.0000 mg | ORAL_CAPSULE | Freq: Three times a day (TID) | ORAL | Status: DC

## 2022-04-15 MED ORDER — POTASSIUM CHLORIDE 10 MEQ/100ML IV SOLN: 10.0000 meq | INTRAVENOUS | Status: DC

## 2022-04-15 MED ORDER — HYDROMORPHONE HCL 1 MG/ML IJ SOLN
1.0000 mg | INTRAMUSCULAR | Status: AC | PRN
  Administered 2022-04-15 (×3): 1 mg via INTRAVENOUS
  Filled 2022-04-15 (×3): qty 1

## 2022-04-15 MED ORDER — PROMETHAZINE HCL 25 MG/ML IJ SOLN
INTRAMUSCULAR | Status: AC
  Filled 2022-04-15: qty 1

## 2022-04-15 NOTE — Assessment & Plan Note (Addendum)
Lab error on admission, was transfused as a result.  Counts stable.

## 2022-04-15 NOTE — Assessment & Plan Note (Signed)
Patient has a history of metastatic pancreatic cancer and is currently on chemotherapy. She has worsening abdominal pain not relieved by her oral opioids at home. Imaging shows increased size of the pancreatic neck/body mass with probable involvement of the gastric wall and similar moderate fluid distension of the stomach. 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 Will treat patient with IV pain medication

## 2022-04-15 NOTE — ED Notes (Signed)
Handoff report given to [facility] ?

## 2022-04-15 NOTE — Plan of Care (Signed)

## 2022-04-15 NOTE — Progress Notes (Signed)
Patient ID: Tara Jensen, female   DOB: 05-15-1965, 57 y.o.   MRN: [id number] Asked by [facility] to evaluate pt for possible celiac plexus neurolysis. Pt familiar to our team from consultation with Dr. Maryelizabeth Kaufmann on 02/26/22 to discuss treatment options for persistent abdominal pain in setting of metastatic pancreatic cancer. She underwent celiac plexus block on 03/11/22 and did not have sig relief of her pain. She now presents with worsening abd pain despite medication use and anemia (hgb 6.1- scheduled for transfusion). Latest CT A/P today revealed:   1. Increased size of the pancreatic neck/body mass with probable involvement of the gastric wall and similar moderate fluid distension of the stomach. 2. 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 3. Trace pelvic free fluid. No discrete peritoneal or omental nodularity. 4. Moderate volume of formed stool throughout the colon. Correlate for constipation.  Case was reviewed by Dr. Maryelizabeth Kaufmann today; pt seen today by Dr. Maryelizabeth Kaufmann and offered her CT guided celiac plexus neurolysis but she does not want to undergo the procedure at this time. We will cont to monitor status and make NPO after MN in case pt decides to proceed as we have an opening at 0900 on 3/5 to do case with Dr. Maryelizabeth Kaufmann.

## 2022-04-15 NOTE — TOC Progression Note (Signed)
Transition of Care [facility]) - Progression Note    Patient Details  Name: Tara Jensen MRN: [id number] Date of Birth: June 03, 1965  Transition of [facility]) CM/SW [place], RN Phone Number:[phone]  04/15/2022, 3:38 PM  Clinical Narrative:     Transition of Care (TOC) Screening Note   Patient Details  Name: Tara Jensen Date of Birth: 1965-09-04   Transition of Care [facility]) CM/SW Contact:    Angelita Ingles, RN Phone Number: 04/15/2022, 3:38 PM    Transition of Care Department [facility]) has reviewed patient and no TOC needs have been identified at this time. We will continue to monitor patient advancement through interdisciplinary progression rounds. If new patient transition needs arise, please place a TOC consult.          Expected Discharge Plan and Services                                               Social Determinants of Health (SDOH) Interventions SDOH Screenings   Food Insecurity: No Food Insecurity (04/15/2022)  Housing: Low Risk  (04/15/2022)  Transportation Needs: No Transportation Needs (04/15/2022)  Utilities: Not At Risk (04/15/2022)  Alcohol Screen: Low Risk  (09/08/2019)  Depression (PHQ2-9): Low Risk  (09/08/2019)  Financial Resource Strain: Low Risk  (09/08/2019)  Physical Activity: Sufficiently Active (09/08/2019)  Social Connections: Moderately Integrated (09/08/2019)  Stress: No Stress Concern Present (09/08/2019)  Tobacco Use: Low Risk  (04/15/2022)    Readmission Risk Interventions     No data to display

## 2022-04-15 NOTE — H&P (Addendum)
History and Physical    Patient: Tara Jensen [id number] DOB: 12/30/1965 DOA: 04/15/2022 DOS: the patient was seen and examined on 04/15/2022 PCP: Chesley Noon, MD  Patient coming from: Home  Chief Complaint:  Chief Complaint  Patient presents with   Abdominal Pain   HPI: Tara Jensen is a 57 y.o. female with medical history significant for metastatic pancreatic cancer with complications of neuropathy, GERD, anxiety and depression who presents to the emergency room for evaluation of worsening abdominal pain mostly in the epigastrium with radiation to her back associated with nausea.  She rates her pain a 9 x 10 in intensity at its worst and has not had relief with her home doses of Dilaudid and MS Contin.  Patient states that she has not slept in 2 days due to the severity of her pain.  Patient states that she had a celiac block done 2 months ago with no improvement in her pain.   Patient is currently on chemotherapy and received her third cycle of gemcitabine/Abraxane on 04/04/22. She has had nausea but denies having any vomiting.  She also complains of constipation most likely related to pain medications that she takes at home.  She also complains of increased fatigue and weakness but denies having any dizziness or lightheadedness and denies having any falls.  She denies having any melena stools, no hematochezia or hematemesis. She denies having any fever or chills, no cough, no chest pain, no shortness of breath, no headache, no blurred vision, no palpitations, no diaphoresis, no focal deficit. Abnormal labs include white count 3.2, hemoglobin 6.1, platelet count 76, magnesium 1.1, potassium 3.7 CT scan of abdomen and pelvis shows increased size of the pancreatic neck/body mass with probable involvement of the gastric wall and similar moderate fluid distension of the stomach. 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 Chronic involvement of the porta  splenic confluence with extensive portosystemic collateral vessels. Trace pelvic free fluid. No discrete peritoneal or omental nodularity. Moderate volume of formed stool throughout the colon. Correlate for constipation. She will be referred to observation status for blood transfusion and pain control.     Review of Systems: As mentioned in the history of present illness. All other systems reviewed and are negative. Past Medical History:  Diagnosis Date   Abnormal Pap smear    Anxiety    "situational"   BV (bacterial vaginosis)    Cancer (HCC)    pancreatic   Cervical polyp    Depression    "situational"   Family history of adverse reaction to anesthesia    mother had N/V   Recurrent UTI (urinary tract infection)    Past Surgical History:  Procedure Laterality Date   BIOPSY  07/16/2021   Procedure: BIOPSY;  Surgeon: Rush Landmark, Telford Nab., MD;  Location: [facility] ENDOSCOPY;  Service: Gastroenterology;;   CERVICAL CONE BIOPSY  1996   ESOPHAGOGASTRODUODENOSCOPY (EGD) WITH PROPOFOL N/A 07/16/2021   Procedure: ESOPHAGOGASTRODUODENOSCOPY (EGD) WITH PROPOFOL;  Surgeon: Irving Copas., MD;  Location: [place];  Service: Gastroenterology;  Laterality: N/A;   FINE NEEDLE ASPIRATION  07/16/2021   Procedure: FINE NEEDLE ASPIRATION (FNA) LINEAR;  Surgeon: Irving Copas., MD;  Location: [place];  Service: Gastroenterology;;   IR RADIOLOGIST EVAL & MGMT  02/26/2022   LASIK     PORTACATH PLACEMENT Right 08/01/2021   Procedure: INSERTION PORT-A-CATH;  Surgeon: Dwan Bolt, MD;  Location: [place];  Service: General;  Laterality: Right;   UPPER ESOPHAGEAL ENDOSCOPIC  ULTRASOUND (EUS) N/A 07/16/2021   Procedure: UPPER ESOPHAGEAL ENDOSCOPIC ULTRASOUND (EUS);  Surgeon: Irving Copas., MD;  Location: [place];  Service: Gastroenterology;  Laterality: N/A;   Social History:  reports that she has never smoked. She has never used smokeless tobacco. She reports that she does  not currently use alcohol. She reports that she does not use drugs.  No Known Allergies  Family History  Problem Relation Age of Onset   Depression Mother    Depression Brother    Stroke Maternal Grandmother    Hypertension Maternal Grandfather    Breast cancer Cousin 82       maternal first cousin   Colon cancer Neg Hx    Esophageal cancer Neg Hx    Rectal cancer Neg Hx    Stomach cancer Neg Hx     Prior to Admission medications   Medication Sig Start Date End Date Taking? Authorizing Provider  acetaminophen (TYLENOL) 500 MG tablet Take 1,000 mg by mouth every 6 (six) hours as needed for moderate pain or mild pain. Patient not taking: Reported on 04/02/2022    [provider]  BIOTIN PO Take 2,500 mcg by mouth daily. Patient not taking: Reported on 04/02/2022    [provider]  dexamethasone (DECADRON) 4 MG tablet Take 1 tablet (4 mg total) by mouth 2 (two) times daily with a meal. Take twice daily x 2 days after each chemotherapy treatment beginning day 2 of each cycle Patient not taking: Reported on 02/25/2022 02/08/22   Owens Shark, NP  diclofenac Sodium (VOLTAREN) 1 % GEL Apply 2 g topically 4 (four) times daily as needed (back pain.). Patient not taking: Reported on 04/02/2022 09/05/21   Ladell Pier, MD  famotidine (PEPCID) 40 MG tablet Take 40 mg by mouth 2 (two) times daily. 07/24/21   [provider]  gabapentin (NEURONTIN) 300 MG capsule Take 1 capsule (300 mg total) by mouth 3 (three) times daily. 03/12/22   Owens Shark, NP  HYDROmorphone (DILAUDID) 2 MG tablet Take 1-2 tablets (2-4 mg total) by mouth every 4 (four) hours as needed for severe pain. 03/22/22   Owens Shark, NP  lidocaine-prilocaine (EMLA) cream Apply 1 Application topically as needed. 02/25/22   Ladell Pier, MD  magic mouthwash SOLN Take 5 mLs by mouth 4 (four) times daily. Patient not taking: Reported on 08/22/2021 08/15/21   Owens Shark, NP  morphine (MS CONTIN) 30 MG  12 hr tablet Take 1 tablet (30 mg total) by mouth every 12 (twelve) hours. 03/29/22   Owens Shark, NP  ondansetron (ZOFRAN) 8 MG tablet Take 1 tablet (8 mg total) by mouth every 8 (eight) hours as needed for nausea or vomiting (Starting day 4 after chemo as needed for nausea and vomiting). 12/28/21   Ladell Pier, MD  pantoprazole (PROTONIX) 40 MG tablet Take 40 mg by mouth daily before breakfast. 07/11/21   [provider]  polyethylene glycol (MIRALAX / GLYCOLAX) 17 g packet Take 17 g by mouth 2 (two) times daily. 03/22/22   Raiford Noble Latif, DO  predniSONE (DELTASONE) 10 MG tablet Take 2 tablets (20 mg total) by mouth daily with breakfast. 04/04/22 05/04/22  Ladell Pier, MD  prochlorperazine (COMPAZINE) 5 MG tablet Take 1-2 tablets (5-10 mg total) by mouth every 6 (six) hours as needed for nausea or vomiting. Patient not taking: Reported on 04/02/2022 09/21/21   Owens Shark, NP  Sennosides-Docusate Sodium (SENNA PLUS) 8.6-50 MG  CAPS Take 2 capsules by mouth daily as needed (For constipation).    [provider]  sertraline (ZOLOFT) 25 MG tablet Take 1 tablet (25 mg total) by mouth daily. 02/08/22   Owens Shark, NP  sorbitol 70 % solution Take 30 mLs by mouth daily as needed (Take 30 ml every 6 hours till bowel movement, then 30 ml daily). 04/01/22   Ladell Pier, MD    Physical Exam: Vitals:   04/15/22 1200 04/15/22 1230 04/15/22 1343 04/15/22 1600  BP: 125/77 132/76 129/77   Pulse: 69 62 72   Resp: '14 16 14 17  '$ Temp:   98.3 F (36.8 C)   TempSrc:   Oral   SpO2: 97% 96% 96%   Weight:   46.2 kg   Height:   '5\' 10"'$  (1.778 m)    Physical Exam Vitals and nursing note reviewed.  Constitutional:      Comments: Chronically ill-appearing  HENT:     Head: Normocephalic and atraumatic.  Eyes:     Comments: Pale conjunctivae  Cardiovascular:     Rate and Rhythm: Normal rate and regular rhythm.  Pulmonary:     Effort: Pulmonary effort is normal.     Breath  sounds: Normal breath sounds.  Abdominal:     General: Abdomen is flat. Bowel sounds are normal.     Palpations: Abdomen is soft.     Tenderness: There is abdominal tenderness in the epigastric area.  Skin:    General: Skin is warm and dry.  Neurological:     General: No focal deficit present.     Mental Status: She is alert.  Psychiatric:        Mood and Affect: Mood normal.        Behavior: Behavior normal.     Data Reviewed: Relevant notes from primary care and specialist visits, past discharge summaries as available in EHR, including Care Everywhere. Prior diagnostic testing as pertinent to current admission diagnoses Updated medications and problem lists for reconciliation ED course, including vitals, labs, imaging, treatment and response to treatment Triage notes, nursing and pharmacy notes and ED provider's notes Notable results as noted in HPI Labs reviewed.  Total protein 7.0, albumin 3.9, AST 11, ALT 11, alkaline phosphatase 49, total bilirubin 0.3, direct bilirubin 0.1, magnesium 1.1, sodium 137, potassium 3.7, chloride 100, bicarb 30, glucose 121, BUN 14, creatinine 0.43, calcium 9.5, white count 3.2, hemoglobin 6.1, hematocrit 19.4, platelet count 76 Twelve-lead EKG reviewed by me shows sinus rhythm There are no new results to review at this time.  Assessment and Plan: * Pancytopenia ([place]) Most likely secondary to recent chemotherapy.   Hemoglobin is 6.1 and patient is symptomatic with complaints of weakness and increased fatigue. We will transfuse 1 unit of packed RBC She also has thrombocytopenia with no evidence of bleeding Monitor platelet count  Depression Continue sertraline  Protein-calorie malnutrition, severe Severe malnutrition in context of acute illness/injury related to acute illness (constipation) as evidenced by moderate fat depletion, energy intake < or equal to 50% for > or equal to 5 days. BMI 14.61 Place patient on Ensure with meals Place  patient on regular diet Add Multivitamin with minerals daily     Cancer of pancreas, body ([place]) Patient has a history of metastatic pancreatic cancer and is currently on chemotherapy. She has worsening abdominal pain not relieved by her oral opioids at home. Imaging shows increased size of the pancreatic neck/body mass with probable involvement of the gastric wall and  similar moderate fluid distension of the stomach. 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 Will treat patient with IV pain medication      Advance Care Planning:   Code Status: Prior   Consults: Interventional radiology, oncology  Family Communication: Greater than 50% of time was spent discussing patient's condition and plan of care with her and her healthcare power of attorney Brayton Caves at the bedside.  All questions and concerns have been addressed.  They verbalized understanding and agree with the plan.  Severity of Illness: The appropriate patient status for this patient is OBSERVATION. Observation status is judged to be reasonable and necessary in order to provide the required intensity of service to ensure the patient's safety. The patient's presenting symptoms, physical exam findings, and initial radiographic and laboratory data in the context of their medical condition is felt to place them at decreased risk for further clinical deterioration. Furthermore, it is anticipated that the patient will be medically stable for discharge from the hospital within 2 midnights of admission.   Author: Collier Bullock, MD 04/15/2022 5:31 PM  For on call review [email or web address].

## 2022-04-15 NOTE — Progress Notes (Signed)
   04/15/22 1343  Vitals  Temp 98.3 F (36.8 C)  Temp Source Oral  BP 129/77  MAP (mmHg) 89  BP Location Right Arm  BP Method Automatic  Patient Position (if appropriate) Lying  Pulse Rate 72  Pulse Rate Source Monitor  Resp 14  Oxygen Therapy  SpO2 96 %  O2 Device Room Air  Height and Weight  Height '5\' 10"'$  (1.778 m)  Weight 46.2 kg  BSA (Calculated - sq m) 1.51 sq meters  BMI (Calculated) 14.61  Weight in (lb) to have BMI = 25 173.9  MEWS Score  MEWS Temp 0  MEWS Systolic 0  MEWS Pulse 0  MEWS RR 0  MEWS LOC 0  MEWS Score 0  MEWS Score Color Green      Patient arrived from [place], VSS, denies any nausea. Pain assessed.    [facility] informed.

## 2022-04-15 NOTE — ED Triage Notes (Signed)
States cancer center told her to come here to get a CT scan

## 2022-04-15 NOTE — Telephone Encounter (Signed)
Received call from Dr. Kellie Simmering, [facility] ED.  Tara Jensen is there for evaluation of severe abdominal pain.  Hemoglobin is 6.2.  She is being admitted for additional evaluation, transfusion support.  I will make the on-call physician aware of her admission.

## 2022-04-15 NOTE — Consult Note (Signed)
[name] is a very nice 57 year old white female.  She is followed by Dr. Benay Jensen.  She was diagnosed with locally advanced pancreatic cancer back in May 2023.  At that time, she appeared to have disease that was not resectable.  She subsequently underwent chemotherapy with FOLFIRINOX.  I think she has had 14 cycles.  Apparently in July 2023, she had a CT at [facility] which showed metastatic disease.  Currently, she is on Abraxane/Gemzar.  Her last chemotherapy was couple weeks ago.  She did have a lot more in Tara way of pain.  She is on a combination of MS Contin and Dilaudid.  She has had a celiac block which did not seem to help that much.  She is lost quite a bit of weight.  She just cannot eat much because of pain.  She was admitted on 04/15/2022 because of pain and marked anemia.  When she was admitted, her CBC showed a white cell count of 3.2.  Hemoglobin 6.2.  Platelet count 76,000.  There is no problems with melena or hematochezia.  MCV was 100.5.  Typically, her hemoglobin is between 10.5-11.  Her electrolytes which came in showed a sodium 137.  Potassium 3.7.  BUN 14 creatinine 0.43.  Calcium 9.5 with an albumin of 3.9.  Her bilirubin was 0.3.  Her lipase was less than 10.  She did have a CT scan of Tara abdomen pelvis.  This did show growth of Tara pancreatic mass.  It measured 5.7 x 4.2 cm.  There is no obvious hepatic lesions.  There was encasement of Tara celiac artery and is proximal branches.  She did have some constipation.  She is currently getting some IV Dilaudid.  She has had no fever.  There has been no obvious bleeding.  She has had negative Hemoccults.  She just cannot eat all that much.  There is some thought about doing another celiac block.  I do not think this would be a bad idea.  Maybe, she might benefit from gabapentin which might help with some of Tara discomfort.  She might be constipated.  I think lactulose would not be a bad idea.  I know that Dr. Collene Jensen of  Gastroenterology is following her.  I think she is on Movantik.  She has had no problems with COVID.  There is been no cough or shortness of breath.  She has had no rashes.   Her vital signs show temperature of 98.3.  Pulse 72.  Blood pressure 129/77.  Weight is 101 pounds.  Head and neck exam shows no scleral icterus.  There is no adenopathy in Tara neck.  Lungs are clear bilaterally.  Cardiac exam regular rate and rhythm.  There are no murmurs.  Abdomen is soft.  Bowel sounds are slightly decreased.  There is no fluid wave.  There is no palpable abdominal mass.  There is no nodularity.  There is no palpable liver or spleen tip.  Extremity shows some muscle atrophy in upper and lower extremities.  Neurological exam is nonfocal.   [name] is a very charming 57 year old white female.  She has at least locally advanced pancreatic cancer.  She has been through 2 lines of treatment for this.  It is certainly possible that she may have involvement of Tara celiac plexus that might be causing discomfort.  Again, I do not see a problem with trying to use another celiac block.  I am quite worried about this anemia.  We really need to  check a reticulocyte count on her.  I need to check LDH.  I really need to look at her blood smear.  Tara 1 thing that I would think about would be microangiopathic hemolytic anemia of malignancy.  This we can see with adenocarcinomas of Tara GI tract.  I would go and transfuse her.  We will see about some lactulose to try to help with Tara constipation.  We will get her back on the MS Contin.  I think she needs this for chronic pain control.  I would recheck her labs tomorrow.  I know that Tara staff on [address]. will do a wonderful job helping take care of her.  I want to make sure that we focus on her comfort.  We really need to improve upon this if we are going to make any progress otherwise.  Tara Haw, MD  Tara Jensen 1:5

## 2022-04-15 NOTE — ED Triage Notes (Signed)
Generalized abd pain that radiates into back.  Ongoing pain which has been getting worse over the last week

## 2022-04-15 NOTE — Assessment & Plan Note (Signed)
Continue sertraline 

## 2022-04-15 NOTE — ED Provider Notes (Signed)
[facility] Provider Note   CSN: [id number] Arrival date & time: 04/15/22  [id number]     History  Chief Complaint  Patient presents with   Abdominal Pain    Tara Jensen is a 57 y.o. female.   Abdominal Pain Associated symptoms: nausea      57 year old Caucasian female with past medical history significant for but not limited to pancreatic cancer, chemotherapy-induced neuropathy, GERD, chronic anxiety depression who presents with worsening abdominal pain.  Her oncologist told her to present to the emergency department for CT imaging.  She was recently hospitalized in early February for pain control in the setting of her pancreatic cancer.  Patient states that she has had significantly worsening pain over the past week.  She endorses epigastric pain that radiates to her back with associated nausea.  She took multiple rounds of her home Dilaudid and MS Contin last night without relief.  She took Zofran this morning for nausea.  Her oncologist told her to present to the emergency department for CT imaging due to her worsening pain.  She denies any vomiting.  No fever or chills.  She was hospitalized from 2/7 to 03/22/2022 for pain control in the setting of her pancreatic cancer.  She follows outpatient with oncology, Dr. Julieanne Manson.  She undergoes chemotherapy every other week.  Her last bowel movement was this morning and was normal.  Home Medications Prior to Admission medications   Medication Sig Start Date End Date Taking? Authorizing Provider  acetaminophen (TYLENOL) 500 MG tablet Take 1,000 mg by mouth every 6 (six) hours as needed for moderate pain or mild pain. Patient not taking: Reported on 04/02/2022    [provider]  BIOTIN PO Take 2,500 mcg by mouth daily. Patient not taking: Reported on 04/02/2022    [provider]  dexamethasone (DECADRON) 4 MG tablet Take 1 tablet (4 mg total) by mouth 2 (two) times daily with  a meal. Take twice daily x 2 days after each chemotherapy treatment beginning day 2 of each cycle Patient not taking: Reported on 02/25/2022 02/08/22   Owens Shark, NP  diclofenac Sodium (VOLTAREN) 1 % GEL Apply 2 g topically 4 (four) times daily as needed (back pain.). Patient not taking: Reported on 04/02/2022 09/05/21   Ladell Pier, MD  famotidine (PEPCID) 40 MG tablet Take 40 mg by mouth 2 (two) times daily. 07/24/21   [provider]  gabapentin (NEURONTIN) 300 MG capsule Take 1 capsule (300 mg total) by mouth 3 (three) times daily. 03/12/22   Owens Shark, NP  HYDROmorphone (DILAUDID) 2 MG tablet Take 1-2 tablets (2-4 mg total) by mouth every 4 (four) hours as needed for severe pain. 03/22/22   Owens Shark, NP  lidocaine-prilocaine (EMLA) cream Apply 1 Application topically as needed. 02/25/22   Ladell Pier, MD  magic mouthwash SOLN Take 5 mLs by mouth 4 (four) times daily. Patient not taking: Reported on 08/22/2021 08/15/21   Owens Shark, NP  morphine (MS CONTIN) 30 MG 12 hr tablet Take 1 tablet (30 mg total) by mouth every 12 (twelve) hours. 03/29/22   Owens Shark, NP  ondansetron (ZOFRAN) 8 MG tablet Take 1 tablet (8 mg total) by mouth every 8 (eight) hours as needed for nausea or vomiting (Starting day 4 after chemo as needed for nausea and vomiting). 12/28/21   Ladell Pier, MD  pantoprazole (PROTONIX) 40 MG tablet Take 40 mg by mouth  daily before breakfast. 07/11/21   [provider]  polyethylene glycol (MIRALAX / GLYCOLAX) 17 g packet Take 17 g by mouth 2 (two) times daily. 03/22/22   Raiford Noble Latif, DO  predniSONE (DELTASONE) 10 MG tablet Take 2 tablets (20 mg total) by mouth daily with breakfast. 04/04/22 05/04/22  Ladell Pier, MD  prochlorperazine (COMPAZINE) 5 MG tablet Take 1-2 tablets (5-10 mg total) by mouth every 6 (six) hours as needed for nausea or vomiting. Patient not taking: Reported on 04/02/2022 09/21/21   Owens Shark, NP   Sennosides-Docusate Sodium (SENNA PLUS) 8.6-50 MG CAPS Take 2 capsules by mouth daily as needed (For constipation).    [provider]  sertraline (ZOLOFT) 25 MG tablet Take 1 tablet (25 mg total) by mouth daily. 02/08/22   Owens Shark, NP  sorbitol 70 % solution Take 30 mLs by mouth daily as needed (Take 30 ml every 6 hours till bowel movement, then 30 ml daily). 04/01/22   Ladell Pier, MD      Allergies    Patient has no known allergies.    Review of Systems   Review of Systems  Gastrointestinal:  Positive for abdominal pain and nausea.  All other systems reviewed and are negative.   Physical Exam Updated Vital Signs BP (!) 141/78   Pulse 67   Temp 98 F (36.7 C) (Oral)   Resp 14   Ht '5\' 10"'$  (1.778 m)   Wt 46 kg   LMP 11/01/2018   SpO2 100%   BMI 14.55 kg/m  Physical Exam Vitals and nursing note reviewed.  Constitutional:      General: She is not in acute distress.    Appearance: She is well-developed.  HENT:     Head: Normocephalic and atraumatic.  Eyes:     Conjunctiva/sclera: Conjunctivae normal.  Cardiovascular:     Rate and Rhythm: Normal rate and regular rhythm.     Heart sounds: No murmur heard. Pulmonary:     Effort: Pulmonary effort is normal. No respiratory distress.     Breath sounds: Normal breath sounds.  Abdominal:     Palpations: Abdomen is soft.     Tenderness: There is abdominal tenderness in the epigastric area. There is no guarding or rebound.  Musculoskeletal:        General: No swelling.     Cervical back: Neck supple.  Skin:    General: Skin is warm and dry.     Capillary Refill: Capillary refill takes less than 2 seconds.  Neurological:     Mental Status: She is alert.  Psychiatric:        Mood and Affect: Mood normal.     ED Results / Procedures / Treatments   Labs (all labs ordered are listed, but only abnormal results are displayed) Labs Reviewed  LIPASE, BLOOD - Abnormal; Notable for the following components:       Result Value   Lipase <10 (*)    All other components within normal limits  COMPREHENSIVE METABOLIC PANEL - Abnormal; Notable for the following components:   Potassium 2.1 (*)    Chloride 121 (*)    CO2 19 (*)    Creatinine, Ser <0.30 (*)    Calcium 5.5 (*)    Total Protein 4.2 (*)    Albumin 2.4 (*)    AST 6 (*)    Alkaline Phosphatase 30 (*)    Total Bilirubin 0.2 (*)    All other components within normal limits  CBC - Abnormal; Notable for the following components:   WBC 3.2 (*)    RBC 1.93 (*)    Hemoglobin 6.2 (*)    HCT 19.1 (*)    Platelets 76 (*)    All other components within normal limits  MAGNESIUM - Abnormal; Notable for the following components:   Magnesium 1.1 (*)    All other components within normal limits  CBC WITH DIFFERENTIAL/PLATELET - Abnormal; Notable for the following components:   WBC 3.2 (*)    RBC 1.93 (*)    Hemoglobin 6.1 (*)    HCT 19.4 (*)    MCV 100.5 (*)    Platelets 76 (*)    Lymphs Abs 0.5 (*)    All other components within normal limits  BASIC METABOLIC PANEL - Abnormal; Notable for the following components:   Glucose, Bld 121 (*)    Creatinine, Ser 0.43 (*)    All other components within normal limits  HEPATIC FUNCTION PANEL - Abnormal; Notable for the following components:   AST 11 (*)    Indirect Bilirubin 0.2 (*)    All other components within normal limits  OCCULT BLOOD X 1 CARD TO LAB, STOOL  LACTIC ACID, PLASMA    EKG EKG Interpretation  Date/Time:  Monday April 15 2022 08:58:01 EST Ventricular Rate:  76 PR Interval:    QRS Duration: 102 QT Interval:  370 QTC Calculation: 416 R Axis:   79 Text Interpretation: Sinus rhythm Confirmed by Regan Lemming ([id number]) on 04/15/2022 9:16:09 AM  Radiology CT ABDOMEN PELVIS W CONTRAST  Result Date: 04/15/2022 CLINICAL DATA:  Generalized abdominal pain radiating to the back worsening over the last week. History of pancreatic cancer. * Tracking Code: BO * EXAM: CT ABDOMEN AND  PELVIS WITH CONTRAST TECHNIQUE: Multidetector CT imaging of the abdomen and pelvis was performed using the standard protocol following bolus administration of intravenous contrast. RADIATION DOSE REDUCTION: This exam was performed according to the departmental dose-optimization program which includes automated exposure control, adjustment of the mA and/or kV according to patient size and/or use of iterative reconstruction technique. CONTRAST:  26m OMNIPAQUE IOHEXOL 350 MG/ML SOLN COMPARISON:  Multiple priors including most recent CT March 20, 2022 FINDINGS: Lower chest: No acute abnormality. Hepatobiliary: No suspicious hepatic lesion. Gallbladder is unremarkable. Biliary tree measures upper limits of normal at 6 mm, unchanged from prior. Pancreas: Heterogeneous lesion in the pancreatic neck/body junction measures 5.7 x 4.2 cm on image 25/2 previously 4.8 x 3.6 cm when remeasured for consistency. Similar atrophy and ductal dilation in the pancreatic tail. Spleen: No splenomegaly. Adrenals/Urinary Tract: Bilateral adrenal glands appear normal. No hydronephrosis. Kidneys demonstrate symmetric enhancement and excretion of contrast material. Urinary bladder is unremarkable for degree of distension. Stomach/Bowel: Similar loss of fat plane between the pancreatic mass in the gastric antral wall with gastric wall thickening in this area for instance on image 28/2. Similar moderate distension of the stomach with gas and fluid. No evidence of bowel obstruction. Moderate volume of formed stool throughout the colon. Vascular/Lymphatic: Normal caliber abdominal aorta. 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 Abutment of the SMA. 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 Reproductive: Uterus and bilateral adnexa are unremarkable. Other: Trace pelvic free fluid. No discrete peritoneal or omental nodularity Musculoskeletal: No aggressive lytic or blastic lesion of  bone. IMPRESSION: 1. Increased size of the pancreatic neck/body mass with probable involvement of the gastric wall and similar moderate fluid distension of the stomach. 2. 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 Chronic involvement of the porta splenic confluence with extensive portosystemic collateral vessels. 3. Trace pelvic free fluid. No discrete peritoneal or omental nodularity. 4. Moderate volume of formed stool throughout the colon. Correlate for constipation. Electronically Signed   By: Dahlia Bailiff M.D.   On: 04/15/2022 10:22    Procedures .Critical Care  Performed by: Regan Lemming, MD Authorized by: Regan Lemming, MD   Critical care provider statement:    Critical care time (minutes):  30   Critical care was time spent personally by me on the following activities:  Development of treatment plan with patient or surrogate, discussions with consultants, evaluation of patient's response to treatment, examination of patient, ordering and review of laboratory studies, ordering and review of radiographic studies, ordering and performing treatments and interventions, pulse oximetry, re-evaluation of patient's condition and review of old charts   Care discussed with: admitting provider       Medications Ordered in ED Medications  HYDROmorphone (DILAUDID) injection 1 mg (1 mg Intravenous Given 04/15/22 1122)  promethazine (PHENERGAN) 12.5 mg in sodium chloride 0.9 % 50 mL IVPB (0 mg Intravenous Stopped 04/15/22 1000)  lactated ringers bolus 1,000 mL (0 mLs Intravenous Stopped 04/15/22 1044)  HYDROmorphone (DILAUDID) injection 1 mg (1 mg Intravenous Given 04/15/22 1008)  iohexol (OMNIPAQUE) 350 MG/ML injection 100 mL (50 mLs Intravenous Contrast Given 04/15/22 [id number])    ED Course/ Medical Decision Making/ A&P Clinical Course as of 04/15/22 1144  Mon Apr 15, 2022  0924 Hemoglobin(!!): 6.2 [JL]  0937 Fecal Occult Blood, POC: NEGATIVE [JL]  1020 Hemoglobin(!!): 6.1  [JL]    Clinical Course User Index [JL] Regan Lemming, MD                             Medical Decision Making Amount and/or Complexity of Data Reviewed Labs: ordered. Decision-making details documented in ED Course. Radiology: ordered.  Risk Prescription drug management. Decision regarding hospitalization.    57 year old Caucasian female with past medical history significant for but not limited to pancreatic cancer, chemotherapy-induced neuropathy, GERD, chronic anxiety depression who presents with worsening abdominal pain.  Her oncologist told her to present to the emergency department for CT imaging.  She was recently hospitalized in early February for pain control in the setting of her pancreatic cancer.  Patient states that she has had significantly worsening pain over the past week.  She endorses epigastric pain that radiates to her back with associated nausea.  She took multiple rounds of her home Dilaudid and MS Contin last night without relief.  She took Zofran this morning for nausea.  Her oncologist told her to present to the emergency department for CT imaging due to her worsening pain.  She denies any vomiting.  No fever or chills.  She was hospitalized from 2/7 to 03/22/2022 for pain control in the setting of her pancreatic cancer.  She follows outpatient with oncology, Dr. Julieanne Manson.  She undergoes chemotherapy every other week.  Her last bowel movement was this morning and was normal.  On arrival, the patient was vitally stable, afebrile, not tachycardic or tachypneic, BP 122/74, saturating 99% on room air.  Physical exam significant for epigastric tenderness to palpation, no rebound or guarding.  Differential diagnosis includes worsening pain in the setting of her known pancreatic cancer, bowel obstruction, bowel perforation.   IV access was obtained and the patient was administered an IV fluid bolus, IV Phenergan  for nausea, IV Dilaudid.  An EKG was performed to evaluate  the patient's QTc which revealed sinus rhythm, ventricular rate 76, QTc 416.  No acute ischemic changes were noted.  Labs: Initial CBC revealed a pancytopenia, WBC 3.2, hemoglobin 6.2, platelets 76, CBC on repeat due to concern for saline contamination of the patient's metabolic panel revealed persistent pancytopenia and anemia with a WBC 3.2, hemoglobin 6.1, platelets 76.  The patient was consented for blood transfusion.  I informed the patient that we could not perform transfusion here in the emergency department as we only have 2 units emergency release and no blood bank.  Patient will need to be transferred to [facility] for likely admission and blood transfusion given her ongoing need for pain control in the setting of her pancreatic cancer and symptomatic anemia.  Initial CMP revealed sodium 145, potassium 2.1, chloride 121, bicarbonate 19, blood glucose 83, creatinine less than 0.3, calcium 5.5, albumin 2.4, AST and ALT normal, alkaline phosphatase less than 30, T. bili less than 0.2.  These findings are concerning for NaCl contamination of the sample and a repeat hepatic function panel and BMP were ordered.   Patient endorses significant problems with pain despite IV Dilaudid use.  Her initial labs were concerning for an anemia with a hemoglobin of 6.2, rechecked with all cell lines down due to concern for saline contamination of her CMP and still remained persistently anemic on recheck with a hemoglobin of 6.1.  The patient will need a blood transfusion.  Discussed the care of the patient with the patient's oncology office who will notify on-call oncology as the patient will need admission for pain control and symptomatic anemia.  The patient does state that she has had problems with constipation while taking opiates for pain control.  She has been taking Linzess.  BMP and Mg and hepatic function panel was unremarkable.  CT abdomen pelvis with contrast: IMPRESSION:  1. Increased size of the  pancreatic neck/body mass with probable  involvement of the gastric wall and similar moderate fluid  distension of the stomach.  2. Progressive encasement of the celiac artery and its proximal  branches. Chronic involvement of the porta splenic confluence with  extensive portosystemic collateral vessels.  3. Trace pelvic free fluid. No discrete peritoneal or omental  nodularity.  4. Moderate volume of formed stool throughout the colon. Correlate  for constipation.    Given the findings of increasing pain and progressive encasement of the celiac artery and its proximal branches, I did reach out to on-call oncology for recommendations and to see the patient in the hospital. She may benefit from a celiac block.   I discussed the CT findings with on-call radiology, the celiac artery appears to be patent on a nonarterial contrast phased study.  Lactic acid was checked and resulted normal.  There is no evidence of bowel ischemia on her initial CT.  No indication for CT angiogram at this time.  Hospitalist medicine was consulted for admission, Dr. Francine Graven subsequently excepted the patient in admission in stable condition.  Patient will undergo blood transfusion inpatient for symptomatic anemia.  Final Clinical Impression(s) / ED Diagnoses Final diagnoses:  Symptomatic anemia  Epigastric pain  Malignant neoplasm of pancreas, unspecified location of malignancy [facility])    Rx / DC Orders ED Discharge Orders     None         Regan Lemming, MD 04/15/22 1144

## 2022-04-16 DIAGNOSIS — Z79899 Other long term (current) drug therapy: Secondary | ICD-10-CM | POA: Diagnosis not present

## 2022-04-16 DIAGNOSIS — Z66 Do not resuscitate: Secondary | ICD-10-CM | POA: Diagnosis present

## 2022-04-16 DIAGNOSIS — D61818 Other pancytopenia: Secondary | ICD-10-CM | POA: Diagnosis present

## 2022-04-16 DIAGNOSIS — G620 Drug-induced polyneuropathy: Secondary | ICD-10-CM | POA: Diagnosis present

## 2022-04-16 DIAGNOSIS — G893 Neoplasm related pain (acute) (chronic): Secondary | ICD-10-CM | POA: Diagnosis present

## 2022-04-16 DIAGNOSIS — C787 Secondary malignant neoplasm of liver and intrahepatic bile duct: Secondary | ICD-10-CM | POA: Diagnosis present

## 2022-04-16 DIAGNOSIS — Z87440 Personal history of urinary (tract) infections: Secondary | ICD-10-CM | POA: Diagnosis not present

## 2022-04-16 DIAGNOSIS — R64 Cachexia: Secondary | ICD-10-CM | POA: Diagnosis present

## 2022-04-16 DIAGNOSIS — R52 Pain, unspecified: Secondary | ICD-10-CM | POA: Diagnosis not present

## 2022-04-16 DIAGNOSIS — R1013 Epigastric pain: Secondary | ICD-10-CM | POA: Diagnosis present

## 2022-04-16 DIAGNOSIS — F32A Depression, unspecified: Secondary | ICD-10-CM | POA: Diagnosis present

## 2022-04-16 DIAGNOSIS — Z803 Family history of malignant neoplasm of breast: Secondary | ICD-10-CM | POA: Diagnosis not present

## 2022-04-16 DIAGNOSIS — T451X5A Adverse effect of antineoplastic and immunosuppressive drugs, initial encounter: Secondary | ICD-10-CM | POA: Diagnosis present

## 2022-04-16 DIAGNOSIS — E43 Unspecified severe protein-calorie malnutrition: Secondary | ICD-10-CM | POA: Diagnosis present

## 2022-04-16 DIAGNOSIS — Z515 Encounter for palliative care: Secondary | ICD-10-CM | POA: Diagnosis not present

## 2022-04-16 DIAGNOSIS — T402X5A Adverse effect of other opioids, initial encounter: Secondary | ICD-10-CM | POA: Diagnosis present

## 2022-04-16 DIAGNOSIS — Z818 Family history of other mental and behavioral disorders: Secondary | ICD-10-CM | POA: Diagnosis not present

## 2022-04-16 DIAGNOSIS — Z681 Body mass index (BMI) 19 or less, adult: Secondary | ICD-10-CM | POA: Diagnosis not present

## 2022-04-16 DIAGNOSIS — K5903 Drug induced constipation: Secondary | ICD-10-CM | POA: Diagnosis present

## 2022-04-16 DIAGNOSIS — F419 Anxiety disorder, unspecified: Secondary | ICD-10-CM | POA: Diagnosis present

## 2022-04-16 DIAGNOSIS — K219 Gastro-esophageal reflux disease without esophagitis: Secondary | ICD-10-CM | POA: Diagnosis present

## 2022-04-16 DIAGNOSIS — R530 Neoplastic (malignant) related fatigue: Secondary | ICD-10-CM | POA: Diagnosis present

## 2022-04-16 DIAGNOSIS — C251 Malignant neoplasm of body of pancreas: Secondary | ICD-10-CM | POA: Diagnosis present

## 2022-04-16 DIAGNOSIS — M549 Dorsalgia, unspecified: Secondary | ICD-10-CM | POA: Diagnosis present

## 2022-04-16 LAB — COMPREHENSIVE METABOLIC PANEL
ALT: 15 U/L (ref 0–44)
ALT: 14 U/L (ref 0–44)
AST: 14 U/L — ABNORMAL LOW (ref 15–41)
AST: 15 U/L (ref 15–41)
Albumin: 3.8 g/dL (ref 3.5–5.0)
Albumin: 3.8 g/dL (ref 3.5–5.0)
Alkaline Phosphatase: 64 U/L (ref 38–126)
Alkaline Phosphatase: 64 U/L (ref 38–126)
Anion gap: 8 (ref 5–15)
Anion gap: 3 — ABNORMAL LOW (ref 5–15)
BUN: 13 mg/dL (ref 6–20)
BUN: 15 mg/dL (ref 6–20)
CO2: 29 mmol/L (ref 22–32)
CO2: 28 mmol/L (ref 22–32)
Calcium: 9.3 mg/dL (ref 8.9–10.3)
Calcium: 8.8 mg/dL — ABNORMAL LOW (ref 8.9–10.3)
Chloride: 100 mmol/L (ref 98–111)
Chloride: 102 mmol/L (ref 98–111)
Creatinine, Ser: 0.48 mg/dL (ref 0.44–1.00)
Creatinine, Ser: 0.43 mg/dL — ABNORMAL LOW (ref 0.44–1.00)
GFR, Estimated: >60 mL/min (ref >60)
GFR, Estimated: >60 mL/min (ref >60)
Glucose, Bld: 104 mg/dL — ABNORMAL HIGH (ref 70–99)
Glucose, Bld: 138 mg/dL — ABNORMAL HIGH (ref 70–99)
Potassium: 4.1 mmol/L (ref 3.5–5.1)
Potassium: 3.9 mmol/L (ref 3.5–5.1)
Sodium: 137 mmol/L (ref 135–145)
Sodium: 133 mmol/L — ABNORMAL LOW (ref 135–145)
Total Bilirubin: 0.4 mg/dL (ref 0.3–1.2)
Total Bilirubin: 0.5 mg/dL (ref 0.3–1.2)
Total Protein: 7.3 g/dL (ref 6.5–8.1)
Total Protein: 7.2 g/dL (ref 6.5–8.1)

## 2022-04-16 LAB — CBC WITH DIFFERENTIAL/PLATELET
Abs Immature Granulocytes: 0.02 10*3/uL (ref 0.00–0.07)
Abs Immature Granulocytes: 0.01 10*3/uL (ref 0.00–0.07)
Basophils Absolute: 0 10*3/uL (ref 0.0–0.1)
Basophils Absolute: 0 10*3/uL (ref 0.0–0.1)
Basophils Relative: 0 %
Basophils Relative: 0 %
Eosinophils Absolute: 0.1 10*3/uL (ref 0.0–0.5)
Eosinophils Absolute: 0.1 10*3/uL (ref 0.0–0.5)
Eosinophils Relative: 3 %
Eosinophils Relative: 3 %
HCT: 38.9 % (ref 36.0–46.0)
HCT: 38.9 % (ref 36.0–46.0)
Hemoglobin: 12.4 g/dL (ref 12.0–15.0)
Hemoglobin: 12.4 g/dL (ref 12.0–15.0)
Immature Granulocytes: 0 %
Immature Granulocytes: 0 %
Lymphocytes Relative: 25 %
Lymphocytes Relative: 21 %
Lymphs Abs: 1.3 10*3/uL (ref 0.7–4.0)
Lymphs Abs: 1.1 10*3/uL (ref 0.7–4.0)
MCH: 31.6 pg (ref 26.0–34.0)
MCH: 31.9 pg (ref 26.0–34.0)
MCHC: 31.9 g/dL (ref 30.0–36.0)
MCHC: 31.9 g/dL (ref 30.0–36.0)
MCV: 99 fL (ref 80.0–100.0)
MCV: 100 fL (ref 80.0–100.0)
Monocytes Absolute: 0.7 10*3/uL (ref 0.1–1.0)
Monocytes Absolute: 0.6 10*3/uL (ref 0.1–1.0)
Monocytes Relative: 13 %
Monocytes Relative: 12 %
Neutro Abs: 3 10*3/uL (ref 1.7–7.7)
Neutro Abs: 3.2 10*3/uL (ref 1.7–7.7)
Neutrophils Relative %: 59 %
Neutrophils Relative %: 64 %
Platelets: 147 10*3/uL — ABNORMAL LOW (ref 150–400)
Platelets: 142 10*3/uL — ABNORMAL LOW (ref 150–400)
RBC: 3.93 MIL/uL (ref 3.87–5.11)
RBC: 3.89 MIL/uL (ref 3.87–5.11)
RDW: 14.6 % (ref 11.5–15.5)
RDW: 14.7 % (ref 11.5–15.5)
WBC: 5.1 10*3/uL (ref 4.0–10.5)
WBC: 5.1 10*3/uL (ref 4.0–10.5)
nRBC: 0 % (ref 0.0–0.2)
nRBC: 0 % (ref 0.0–0.2)

## 2022-04-16 LAB — PROTIME-INR
INR: 1 (ref 0.8–1.2)
Prothrombin Time: 13.3 seconds (ref 11.4–15.2)

## 2022-04-16 LAB — IRON AND TIBC
Iron: 55 ug/dL (ref 28–170)
Saturation Ratios: 19 % (ref 10.4–31.8)
TIBC: 290 ug/dL (ref 250–450)
UIBC: 235 ug/dL

## 2022-04-16 LAB — TYPE AND SCREEN
ABO/RH(D): O NEG
Antibody Screen: NEGATIVE
Unit division: 0

## 2022-04-16 LAB — BPAM RBC
Blood Product Expiration Date: 202403312359
ISSUE DATE / TIME: 202403042047
Unit Type and Rh: 9500

## 2022-04-16 LAB — MAGNESIUM: Magnesium: 2.2 mg/dL (ref 1.7–2.4)

## 2022-04-16 LAB — PHOSPHORUS: Phosphorus: 3.8 mg/dL (ref 2.5–4.6)

## 2022-04-16 LAB — VITAMIN B12: Vitamin B-12: 377 pg/mL (ref 180–914)

## 2022-04-16 MED ORDER — NALOXEGOL OXALATE 12.5 MG PO TABS: 12.5000 mg | ORAL_TABLET | Freq: Every day | ORAL | Status: DC

## 2022-04-16 MED ORDER — HYDROMORPHONE HCL 2 MG/ML IJ SOLN
2.0000 mg | INTRAMUSCULAR | Status: DC | PRN
  Administered 2022-04-16: 2 mg via INTRAVENOUS
  Filled 2022-04-16: qty 1

## 2022-04-16 MED ORDER — GABAPENTIN 400 MG PO CAPS
400.0000 mg | ORAL_CAPSULE | Freq: Three times a day (TID) | ORAL | Status: DC
  Administered 2022-04-16 – 2022-04-22 (×19): 400 mg via ORAL
  Filled 2022-04-16 (×19): qty 1

## 2022-04-16 MED ORDER — MORPHINE SULFATE ER 15 MG PO TBCR
45.0000 mg | EXTENDED_RELEASE_TABLET | Freq: Two times a day (BID) | ORAL | Status: DC
  Administered 2022-04-16 – 2022-04-17 (×4): 45 mg via ORAL
  Filled 2022-04-16 (×4): qty 3

## 2022-04-16 MED ORDER — ENOXAPARIN SODIUM 40 MG/0.4ML IJ SOSY
40.0000 mg | PREFILLED_SYRINGE | INTRAMUSCULAR | Status: DC
  Administered 2022-04-16 – 2022-04-20 (×4): 40 mg via SUBCUTANEOUS
  Filled 2022-04-16 (×5): qty 0.4

## 2022-04-16 MED ORDER — METHYLNALTREXONE BROMIDE 12 MG/0.6ML ~~LOC~~ SOLN
12.0000 mg | Freq: Once | SUBCUTANEOUS | Status: AC
  Administered 2022-04-16: 12 mg via SUBCUTANEOUS
  Filled 2022-04-16: qty 0.6

## 2022-04-16 MED ORDER — GABAPENTIN 400 MG PO CAPS
400.0000 mg | ORAL_CAPSULE | Freq: Three times a day (TID) | ORAL | Status: DC
  Filled 2022-04-16: qty 1

## 2022-04-16 MED ORDER — HYDROMORPHONE HCL 1 MG/ML IJ SOLN
1.0000 mg | INTRAMUSCULAR | Status: DC | PRN
  Administered 2022-04-16 – 2022-04-18 (×9): 1 mg via INTRAVENOUS
  Filled 2022-04-16 (×9): qty 1

## 2022-04-16 NOTE — Progress Notes (Signed)
Tara Jensen does not wish to have the celiac block this morning.  She just does not feel confident that is can work.  As such, we will discontinue the order for this.  I do appreciate IR trying to fit her in.  I told her that I thought it be worthwhile trying as this because the first time did not help, it does not mean that the second time would not be helpful.  She has a bit better pain control.  She still requiring Dilaudid every 2-3 hours.  I think that we probably have to increase her MS Contin to 45 mg p.o. twice daily.  She is on gabapentin at home.  I thought that was a good idea for her to try to help with any neuropathic pain.  She Jensen to try to eat a little bit more today.  Hopefully, she will be able to enjoy a regular diet.  Her labs are absolutely normal.  I have to believe that what was taking yesterday was not accurate.  She will get 1 unit of blood and her hemoglobin went from 6 6 up to 12.4.  Her platelet count 1 from 76,000 up to  147,000.  Again, I have to believe that the labs that were drawn yesterday were inaccurate.  Her iron studies all look okay.  Her saturation is 90%.  Her vitamin B12 is 377.  She is still worried about pain control at home.  I told her that she could always increase the short-term Dilaudid to 4 mg orally at home.  She is on a low-dose of Dilaudid at home right now.  Hopefully, the increase of MS Contin to 45 mg p.o. twice daily will help with the discomfort.  She still has a positive constipation.  I started her on some lactulose.  Her vital signs show temperature of 98.1.  Pulse 82.  Blood pressure 121/73.  Her lungs are clear bilaterally.  Cardiac exam regular rate and rhythm.  Abdomen is soft.  Bowel sounds are decreased.  They are present.  There is no guarding or rebound tenderness.  There is no fluid wave present.  There is no obvious abdominal mass.  Extremities show some muscle atrophy in upper and lower extremities.  Neurological exam is  nonfocal.  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  She has been through 2 lines of therapy.  I told her that I think 1 option would be to use chemoradiation therapy.  I we will see whether this would be much of an issue for her.  I realize that there were some side effects however, I think that this cancer continues to grow, it will continue to cause more issues for her.  Pain control is her big issue right now.  Hopefully, with the adjustments, we can help with her pain so she can be more functional at home.  I know the staff upon [address] are doing great job with her.  Lattie Haw, MD  Tara Jensen 17:14

## 2022-04-16 NOTE — Progress Notes (Signed)
PROGRESS NOTE    Tara Jensen  [id number] DOB: 1965/02/24 DOA: 04/15/2022 PCP: Chesley Noon, MD    Brief Narrative:  57 year old with history of metastatic pancreatic cancer, chemo induced neuropathy, GERD, anxiety and depression, constipation and cancer related pain presented to the emergency room for evaluation of worsening abdominal pain.  Patient is already on pain management at home with MS Contin and oral Dilaudid, however she had unrelenting pain so came to the ER.  Recently received chemotherapy on 2/22. In the emergency room she was found to have hemoglobin 6.1 platelets 76, magnesium 1.1, potassium 3.7 and retrospectively looks like these were lab error.  CT scan of the abdomen pelvis showed increased size of the pancreatic tumor with involvement of gastric wall and moderate fluid distention of the stomach.  She was started on 1 unit of PRBC transfusion for hemoglobin of 6.1 and sent to the hospital for symptom management.   Assessment & Plan:   Pancytopenia, anemia and thrombocytopenia.  Lab error. Very abnormal looking labs, hemoglobin 6.1.  She was given 1 unit of PRBC transfusion and repeated hemoglobin are 12 and above.  Other evidence of hemodilution including low electrolytes and sodium chloride. Recheck labs, hemoglobin 12, platelets are adequate.  Electrolytes are adequate.  Metastatic pancreatic cancer on chemotherapy with severe cancer related pain and severe constipation. Pain regimen On [name] twice daily at home, increasing to 45 mg twice daily today Continue Dilaudid 1 mg every 2 hours IV as needed with a.m. of converting to oral Dilaudid on discharge. Gabapentin 300 mg 3 times daily at home, increase to 400 mg 3 times daily. Severe significant constipation unrelieved with laxatives at home. 1 dose of Relistor today and subsequently will maintain on lactulose.  She was also prescribed Movantik at home, however insurance authorization was  pending. Followed by oncology regarding management of her cancer. Patient was offered celiac plexus block by IR, however she refused as previous one did not work.  Hypomagnesemia and hypocalcemia: Unsure this was real or lab error.  Replaced.  Repeat chemistry is normal today.   DVT prophylaxis: enoxaparin (LOVENOX) injection 40 mg Start: 04/16/22 1700   Code Status: Full code Family Communication: Patient's brother at the bedside Disposition Plan: Status is: Inpatient Remains inpatient appropriate because: Severe cancer related pain, constipation     Consultants:  Oncology  Procedures:  None  Antimicrobials:  None   Subjective: Patient seen and examined.  Her brother was at the bedside.  Detailed discussion about pain management as above, constipation management.  Multiple questions were answered. Patient was afraid to use 2 mg of IV Dilaudid, dose decreased to 1 mg. Pain is currently controlled and she is trying to take bites from sandwich her brother brought. Agreeable to titrate pain medications and try different medication to help with constipation.  Objective: Vitals:   04/15/22 2110 04/15/22 2335 04/16/22 0445 04/16/22 1306  BP: 120/71 128/72 121/73 114/67  Pulse: 74 69 82 84  Resp: '16 16 18 18  '$ Temp: 98.2 F (36.8 C) (!) 97.5 F (36.4 C) 98.1 F (36.7 C) 98 F (36.7 C)  TempSrc: Oral Oral Oral Oral  SpO2: 98% 98% 97% 94%  Weight:      Height:        Intake/Output Summary (Last 24 hours) at 04/16/2022 1333 Last data filed at 04/16/2022 0531 Gross per 24 hour  Intake 689.44 ml  Output --  Net 689.44 ml   Filed Weights   04/15/22  [id number] 04/15/22 1343  Weight: 46 kg 46.2 kg    Examination:  General exam: Appears calm and comfortable  Appropriately anxious.  She is thin and cachectic. Respiratory system: Clear to auscultation. Respiratory effort normal.  No added sounds. Cardiovascular system: S1 & S2 heard, RRR.  No pedal edema. Gastrointestinal  system: Central nervous system: Alert and oriented. No focal neurological deficits. Extremities: Symmetric 5 x 5 power. Skin: No rashes, lesions or ulcers Psychiatry: Judgement and insight appear normal. Mood & affect appropriate.     Data Reviewed: I have personally reviewed following labs and imaging studies  CBC: Recent Labs  Lab 04/15/22 0905 04/15/22 0943 04/16/22 0515 04/16/22 1102  WBC 3.2* 3.2* 5.1 5.1  NEUTROABS  --  2.2 3.0 3.2  HGB 6.2* 6.1* 12.4 12.4  HCT 19.1* 19.4* 38.9 38.9  MCV 99.0 100.5* 99.0 100.0  PLT 76* 76* 147* [id number]*   Basic Metabolic Panel: Recent Labs  Lab 04/15/22 0905 04/15/22 0943 04/15/22 1008 04/16/22 0515 04/16/22 1102  NA 145  --  137 137 133*  K 2.1*  --  3.7 4.1 3.9  CL 121*  --  100 100 102  CO2 19*  --  '30 29 28  '$ GLUCOSE 83  --  121* 104* 138*  BUN 10  --  '14 13 15  '$ CREATININE <0.30*  --  0.43* 0.48 0.43*  CALCIUM 5.5*  --  9.5 9.3 8.8*  MG  --  1.1*  --   --  2.2  PHOS  --   --   --   --  3.8   GFR: Estimated Creatinine Clearance: 58.6 mL/min (A) (by C-G formula based on SCr of 0.43 mg/dL (L)). Liver Function Tests: Recent Labs  Lab 04/15/22 0905 04/15/22 1008 04/16/22 0515 04/16/22 1102  AST 6* 11* 14* 15  ALT '7 11 15 14  '$ ALKPHOS 30* 49 64 64  BILITOT 0.2* 0.3 0.4 0.5  PROT 4.2* 7.0 7.3 7.2  ALBUMIN 2.4* 3.9 3.8 3.8   Recent Labs  Lab 04/15/22 0905  LIPASE <10*   No results for input(s): "AMMONIA" in the last 168 hours. Coagulation Profile: Recent Labs  Lab 04/16/22 0515  INR 1.0   Cardiac Enzymes: No results for input(s): "CKTOTAL", "CKMB", "CKMBINDEX", "TROPONINI" in the last 168 hours. BNP (last 3 results) No results for input(s): "PROBNP" in the last 8760 hours. HbA1C: No results for input(s): "HGBA1C" in the last 72 hours. CBG: No results for input(s): "GLUCAP" in the last 168 hours. Lipid Profile: No results for input(s): "CHOL", "HDL", "LDLCALC", "TRIG", "CHOLHDL", "LDLDIRECT" in the last 72  hours. Thyroid Function Tests: No results for input(s): "TSH", "T4TOTAL", "FREET4", "T3FREE", "THYROIDAB" in the last 72 hours. Anemia Panel: Recent Labs    04/15/22 1732 04/16/22 0515  VITAMINB12  --  377  TIBC  --  290  IRON  --  55  RETICCTPCT 2.1  --    Sepsis Labs: Recent Labs  Lab 04/15/22 1105  LATICACIDVEN 0.6    No results found for this or any previous visit (from the past 240 hour(s)).       Radiology Studies: CT ABDOMEN PELVIS W CONTRAST  Result Date: 04/15/2022 CLINICAL DATA:  Generalized abdominal pain radiating to the back worsening over the last week. History of pancreatic cancer. * Tracking Code: BO * EXAM: CT ABDOMEN AND PELVIS WITH CONTRAST TECHNIQUE: Multidetector CT imaging of the abdomen and pelvis was performed using the standard protocol following bolus administration of intravenous contrast. RADIATION  DOSE REDUCTION: This exam was performed according to the departmental dose-optimization program which includes automated exposure control, adjustment of the mA and/or kV according to patient size and/or use of iterative reconstruction technique. CONTRAST:  59m OMNIPAQUE IOHEXOL 350 MG/ML SOLN COMPARISON:  Multiple priors including most recent CT March 20, 2022 FINDINGS: Lower chest: No acute abnormality. Hepatobiliary: No suspicious hepatic lesion. Gallbladder is unremarkable. Biliary tree measures upper limits of normal at 6 mm, unchanged from prior. Pancreas: Heterogeneous lesion in the pancreatic neck/body junction measures 5.7 x 4.2 cm on image 25/2 previously 4.8 x 3.6 cm when remeasured for consistency. Similar atrophy and ductal dilation in the pancreatic tail. Spleen: No splenomegaly. Adrenals/Urinary Tract: Bilateral adrenal glands appear normal. No hydronephrosis. Kidneys demonstrate symmetric enhancement and excretion of contrast material. Urinary bladder is unremarkable for degree of distension. Stomach/Bowel: Similar loss of fat plane between the  pancreatic mass in the gastric antral wall with gastric wall thickening in this area for instance on image 28/2. Similar moderate distension of the stomach with gas and fluid. No evidence of bowel obstruction. Moderate volume of formed stool throughout the colon. Vascular/Lymphatic: Normal caliber abdominal aorta. 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 Abutment of the SMA. 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 Reproductive: Uterus and bilateral adnexa are unremarkable. Other: Trace pelvic free fluid. No discrete peritoneal or omental nodularity Musculoskeletal: No aggressive lytic or blastic lesion of bone. IMPRESSION: 1. Increased size of the pancreatic neck/body mass with probable involvement of the gastric wall and similar moderate fluid distension of the stomach. 2. 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 3. Trace pelvic free fluid. No discrete peritoneal or omental nodularity. 4. Moderate volume of formed stool throughout the colon. Correlate for constipation. Electronically Signed   By: JDahlia BailiffM.D.   On: 04/15/2022 10:22        Scheduled Meds:  sodium chloride   Intravenous Once   Chlorhexidine Gluconate Cloth  6 each Topical Daily   enoxaparin (LOVENOX) injection  40 mg Subcutaneous Q24H   feeding supplement  237 mL Oral BID BM   gabapentin  400 mg Oral TID   lactulose  20 g Oral BID   morphine  45 mg Oral Q12H   multivitamin with minerals  1 tablet Oral Daily   [START ON 04/17/2022] naloxegol oxalate  12.5 mg Oral Daily   pantoprazole (PROTONIX) IV  40 mg Intravenous Q12H   predniSONE  20 mg Oral Q breakfast   sertraline  25 mg Oral Daily   Continuous Infusions:  promethazine (PHENERGAN) injection (IM or IVPB) Stopped (04/15/22 0940)     LOS: 0 days    Time spent: 50  minutes    KBarb Merino MD [facility] Pager [phone]

## 2022-04-16 NOTE — Progress Notes (Signed)
Patient ID: Tara Jensen, female   DOB: 05-28-1965, 57 y.o.   MRN: [id number] At this time pt still does not wish to proceed with celiac plexus neurolysis. Please contact IR team at [phone] should pt change her mind or if any additional questions arise.

## 2022-04-17 ENCOUNTER — Inpatient Hospital Stay: Payer: BC Managed Care – PPO | Admitting: Nurse Practitioner

## 2022-04-17 ENCOUNTER — Inpatient Hospital Stay: Payer: BC Managed Care – PPO

## 2022-04-17 ENCOUNTER — Other Ambulatory Visit: Payer: Self-pay | Admitting: *Deleted

## 2022-04-17 ENCOUNTER — Inpatient Hospital Stay: Payer: BC Managed Care – PPO | Admitting: Nutrition

## 2022-04-17 DIAGNOSIS — R52 Pain, unspecified: Secondary | ICD-10-CM | POA: Diagnosis not present

## 2022-04-17 DIAGNOSIS — C251 Malignant neoplasm of body of pancreas: Secondary | ICD-10-CM

## 2022-04-17 DIAGNOSIS — D61818 Other pancytopenia: Secondary | ICD-10-CM | POA: Diagnosis not present

## 2022-04-17 LAB — CANCER ANTIGEN 19-9: CA 19-9: 2544 U/mL — ABNORMAL HIGH (ref 0–35)

## 2022-04-17 MED ORDER — PANTOPRAZOLE SODIUM 40 MG PO TBEC
40.0000 mg | DELAYED_RELEASE_TABLET | ORAL | Status: DC
  Administered 2022-04-17 – 2022-04-25 (×15): 40 mg via ORAL
  Filled 2022-04-17 (×17): qty 1

## 2022-04-17 MED ORDER — FAMOTIDINE 20 MG PO TABS
40.0000 mg | ORAL_TABLET | Freq: Two times a day (BID) | ORAL | Status: DC
  Administered 2022-04-17 – 2022-04-25 (×15): 40 mg via ORAL
  Filled 2022-04-17 (×16): qty 2

## 2022-04-17 MED ORDER — SERTRALINE HCL 25 MG PO TABS
25.0000 mg | ORAL_TABLET | ORAL | Status: DC
  Administered 2022-04-17 – 2022-04-18 (×2): 25 mg via ORAL
  Filled 2022-04-17 (×2): qty 1

## 2022-04-17 MED ORDER — SIMETHICONE 80 MG PO CHEW
80.0000 mg | CHEWABLE_TABLET | Freq: Four times a day (QID) | ORAL | Status: DC | PRN
  Administered 2022-04-17: 80 mg via ORAL
  Filled 2022-04-17: qty 1

## 2022-04-17 MED ORDER — PANTOPRAZOLE SODIUM 40 MG PO TBEC
40.0000 mg | DELAYED_RELEASE_TABLET | Freq: Two times a day (BID) | ORAL | Status: DC
  Administered 2022-04-17: 40 mg via ORAL
  Filled 2022-04-17: qty 1

## 2022-04-17 MED ORDER — METHYLNALTREXONE BROMIDE 12 MG/0.6ML ~~LOC~~ SOLN
12.0000 mg | SUBCUTANEOUS | Status: DC | PRN
  Administered 2022-04-19 – 2022-04-25 (×2): 12 mg via SUBCUTANEOUS
  Filled 2022-04-17 (×5): qty 0.6

## 2022-04-17 MED ORDER — HYDROMORPHONE HCL 1 MG/ML PO LIQD
4.0000 mg | ORAL | Status: DC | PRN
  Administered 2022-04-17 (×3): 4 mg via ORAL
  Administered 2022-04-18: 6 mg via ORAL
  Filled 2022-04-17: qty 4
  Filled 2022-04-17: qty 6
  Filled 2022-04-17 (×3): qty 4

## 2022-04-17 NOTE — Progress Notes (Signed)
Progress Note   Patient: Tara Jensen [id number] DOB: 11/24/1965 DOA: 04/15/2022     1 DOS: the patient was seen and examined on 04/17/2022   Brief hospital course: Patient with h/o metastatic pancreatic cancer with chemo-induced neuropathy, anxiety/depression, and cancer-related pain who presented with worsening abdominal pain.  Last chemo was 2/22.  Found to have pancytopenia on labs and transfused 1 unit prbc but this was lab error.  CT showed increased size of the pancreatic tumor with involvement of hte gastric wall and moderate fluid distention of the stomach.  She was planned for celiac block but patient declined this therapy on 3/5; she is now reconsidering but is uncertain.  She recognizes that her cancer is not responding to treatment and that she is dying and feels that her quality of life is poor.  However, she is still hopeful that therapy will help her in the time she has left.  Assessment and Plan: * Intractable pain -This is her most pressing issue -Almost certainly due to cancer-associated pain -Palliative care consulted for GOC as well as symptom management -Given spread of her cancer in conjunction with worsening pain and constipation and overall deteriorating quality of life, transition to hospice may be in her near future unfortunately - she is aware -She was on MS Contin PTA and this has been increased from 30 to 45 mg BID with some improvement -She was on PO Dilaudid 2-4 mg PO q4h PTA and this has been increased to 4-'6mg'$  -Still supplementing with IV Dilaudid - but the goal would be to decrease and then eliminate this so that she can move towards discharge -She is also on prednisone as an adjunct pain medication  Cancer of pancreas, body ([place]) -Patient has a history of metastatic pancreatic cancer and is currently on chemotherapy. -She has worsening abdominal pain not relieved by her oral opioids at home. -Imaging shows increased size of the pancreatic neck/body mass  with probable involvement of the gastric wall and similar moderate fluid distension of the stomach. -Dr. Benay Spice recommended a radiation oncology referral to consider a course of palliative Xeloda and radiation.  -She is now more open to considering repeat celiac block for pain control, although the first didn't work and she "spiraled afterward"  -She has been started on prednisone and this may help as an adjunct pain medication and appetite stimulant.     Therapeutic opioid-induced constipation (OIC) -Patient has struggled with this issue chronically but was never given a peripheral opioid antagonist until recently -She had great results with Relistor x 1 -She would like to continue this therapy, as constipation negatively impacts her quality of life -Insurance said no to [employer], Entereg, and Relistor are the remaining options -Will continue relistor q48h prn for now [employer] may again decline but hopefully not) -She is followed by Dr. Collene Mares outpatient  Protein-calorie malnutrition, severe -Severe malnutrition in context of acute illness/injury related to acute illness (constipation) as evidenced by moderate fat depletion, energy intake < or equal to 50% for > or equal to 5 days. -BMI 14.61 -Place patient on Ensure with meals -Place patient on regular diet -Add Multivitamin with minerals daily  Depression -While medication will not impact her disease trajectory or her emotional response to it, it may help her coping with it -She has been on low-dose sertraline long-term -Continue sertraline at current dose for now but consider increasing vs. Adjunctive mirtazepine (appetite, mood, sleep)  Pancytopenia ([place]) -Lab error on admission (x 2 - drawn from port) -  She was erroneously transfused as a result -Stable at this time      Subjective: She feels like the medication changes from yesterday were helpful.  She has continued to receive IV pain medications but is open to  transitioning to PO in the hopes that she will be able to go home.  She is open to reconsidering repeat celiac block but is uncertain.  She is open to palliative care.  Physical Exam: Vitals:   04/16/22 0445 04/16/22 1306 04/16/22 2219 04/17/22 0610  BP: 121/73 114/67 117/63 108/63  Pulse: 82 84 67 76  Resp: '18 18 17 17  '$ Temp: 98.1 F (36.7 C) 98 F (36.7 C) 97.6 F (36.4 C) 97.8 F (36.6 C)  TempSrc: Oral Oral Oral Oral  SpO2: 97% 94% 96% 95%  Weight:      Height:       General:  Appears frail, cachectic, chronically ill Eyes:  PERRL, EOMI, normal lids, iris ENT:  grossly normal hearing, lips & tongue, mmm Neck:  no LAD, masses or thyromegaly Cardiovascular:  RRR, no m/r/g. No LE edema.  Respiratory:   CTA bilaterally with no wheezes/rales/rhonchi.  Normal respiratory effort. Abdomen:  soft, mildly diffusely TTP, ND Skin:  no rash or induration seen on limited exam Musculoskeletal:  grossly normal tone BUE/BLE, good ROM, no bony abnormality Psychiatric:  blunted mood and affect, speech fluent and appropriate, AOx3 Neurologic:  CN 2-12 grossly intact, moves all extremities in coordinated fashion  Radiological Exams on Admission: Independently reviewed - see discussion in A/P where applicable  No results found.  EKG: none today   Labs on Admission: I have personally reviewed the available labs and imaging studies at the time of the admission.  Pertinent labs:    Na++ 133 Glucose 138 Normal CBC (platelets 142)  Family Communication: None present; she is capable of communicating with family at this time and is hoping for a palliative care family meeting tomorrow  Disposition: Status is: Inpatient Remains inpatient appropriate because: insufficient cancer-related pain control  Planned Discharge Destination: Home    Time spent: 50 minutes  Author: Karmen Bongo, MD 04/17/2022 4:44 PM  For on call review [email or web address].

## 2022-04-17 NOTE — Plan of Care (Signed)
  Problem: Clinical Measurements: Goal: Ability to maintain clinical measurements within normal limits will improve Outcome: Progressing Goal: Will remain free from infection Outcome: Progressing Goal: Diagnostic test results will improve Outcome: Progressing   Problem: Activity: Goal: Risk for activity intolerance will decrease Outcome: Progressing   Problem: Nutrition: Goal: Adequate nutrition will be maintained Outcome: Progressing   Problem: Coping: Goal: Level of anxiety will decrease Outcome: Progressing   Problem: Elimination: Goal: Will not experience complications related to bowel motility Outcome: Progressing Goal: Will not experience complications related to urinary retention Outcome: Progressing   Problem: Pain Managment: Goal: General experience of comfort will improve Outcome: Progressing   Problem: Safety: Goal: Ability to remain free from injury will improve Outcome: Progressing   Problem: Skin Integrity: Goal: Risk for impaired skin integrity will decrease Outcome: Progressing   

## 2022-04-17 NOTE — Assessment & Plan Note (Addendum)
-  Patient has struggled with this issue chronically but was never given a peripheral opioid antagonist until recently -She had great results with Relistor x 1 -She would like to continue this therapy, as constipation negatively impacts her quality of life -Insurance said no to [employer], Entereg, and Relistor are the remaining options -Will continue relistor q48h prn for now [employer] may again decline but hopefully not) -She is followed by Dr. Collene Mares outpatient

## 2022-04-17 NOTE — Hospital Course (Addendum)
Tara Jensen is a 57 yo female with PMH pancreatic cancer, chemo induced neuropathy, GERD, chronic anxiety/depression who originally presented with abdominal pain and nausea.  She is followed outpatient by Dr. Benay Spice. CT A/P performed on 04/15/2022 showed increased size of the pancreatic neck/body mass with presumed involvement of the gastric wall along with progressive encasement of the celiac artery and proximal branches. Pain control was difficult to achieve and ultimately palliative care was consulted for assistance with pain management.

## 2022-04-17 NOTE — Progress Notes (Signed)
IP PROGRESS NOTE  Subjective:   Tara Jensen is well-known to me with a history of pancreas cancer.  She was last treated with gemcitabine/Abraxane on 04/04/2022.  She developed increased abdominal pain 04/15/2022 and was admitted.  She reports the pain is improved at present.  MS Contin has been increased and she is using IV Dilaudid for breakthrough pain.  The IV Dilaudid does not work for long period she continues to have constipation and bloating.  The constipation was relieved when she received a dose of Relistor. She is working with Dr. Collene Mares to obtain an oral motility agent.  Objective: Vital signs in last 24 hours: Blood pressure 108/63, pulse 76, temperature 97.8 F (36.6 C), temperature source Oral, resp. rate 17, height '5\' 10"'$  (1.778 m), weight 101 lb 13.6 oz (46.2 kg), last menstrual period 11/01/2018, SpO2 95 %.  Intake/Output from previous day: No intake/output data recorded.  Physical Exam:  HEENT: No thrush Lungs: Clear bilaterally Cardiac: Regular rate and rhythm Abdomen: Mildly distended, no hepatosplenomegaly, no mass Extremities: No leg edema Skin: Hyperpigmented rash over the back  Portacath/PICC-without erythema  Lab Results: Recent Labs    04/16/22 0515 04/16/22 1102  WBC 5.1 5.1  HGB 12.4 12.4  HCT 38.9 38.9  PLT 147* 142*    BMET Recent Labs    04/16/22 0515 04/16/22 1102  NA 137 133*  K 4.1 3.9  CL 100 102  CO2 29 28  GLUCOSE 104* 138*  BUN 13 15  CREATININE 0.48 0.43*  CALCIUM 9.3 8.8*    Lab Results  Component Value Date   [id number] 2,544 (H) 04/02/2022    Medications: I have reviewed the patient's current medications.  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  Less than 180 degree abutment of the distal celiac,  common hepatic, and splenic arteries, less than 180 degree abutment of the SMA CT chest 07/18/2021-no evidence of metastatic disease EUS 07/16/2021-extrinsic impression on the stomach at the posterior wall of the gastric body, no gross lesion in the duodenum, 45 x 36 mm pancreas body mass, abutment of the SMA, celiac trunk, and compression of the splenoportal confluence.  No malignant appearing lymph nodes, numerous venous collaterals adjacent to the portal vein, FNA biopsy-malignant cells consistent with adenocarcinoma, T4N0 by EUS Markedly elevated CA 19-9 Guardant360 07/31/2021-K-ras G12R, MSI high-not detected, ATM VUS Cycle 1 FOLFOX 08/08/2021 Cycle 2 FOLFIRINOX 08/22/2021 CT abdomen/pelvis at [facility] 08/31/2021-hypoattenuating liver lesions consistent with metastases, mass in the pancreas neck/body with greater than 100 degree encasement of the celiac axis and common hepatic artery.  Less than 180 degree abutment of the SMA, long segment occlusion of the portal splenic confluence, splenic vein occluded, multiple collateral vessels in the upper abdomen, prominent left periaortic node Cycle 3 FOLFIRINOX 09/05/2021 Cycle 4 FOLFIRINOX 09/19/2021 Cycle 5 FOLFIRINOX 10/03/2021 CTs 10/16/2021-decrease size of primary pancreas mass, hepatic metastases, and a suspicious retroperitoneal node, resolution of left supraclavicular/low jugular adenopathy compared to a CT chest 07/18/2021.  No evidence of disease progression. Cycle 6 FOLFIRINOX 10/17/2021 Cycle 7 FOLFIRINOX 10/31/2021 Cycle 8 FOLFIRINOX 11/14/2021 Cycle 9 FOLFIRINOX 11/28/2021 Cycle 10 FOLFIRINOX 12/12/2021, oxaliplatin held secondary to neuropathy symptoms CTs 12/20/2021-decrease size of pancreas primary, no evidence of hepatic metastases, no new or progressive disease Cycle 11 FOLFIRINOX 12/27/2021, oxaliplatin held due to neuropathy symptoms Cycle 12 FOLFIRINOX 01/09/2022, oxaliplatin  held 01/22/2022-C19-9 higher, 530 Cycle 13 FOLFIRINOX 01/24/2022,  oxaliplatin resumed Cycle 14 FOLFIRINOX 02/06/2022 CTs 02/23/2022-increased size of the pancreas body mass new focal gastric wall thickening and loss of fat plane between the mass and the proximal gastric wall, no evidence of new or progressive metastatic disease Cycle 1 gemcitabine/Abraxane 03/06/2022 Cycle 2 gemcitabine/Abraxane 03/20/2022 CT abdomen/pelvis was 03/20/2022-slight interval increase in size of the pancreas neck/body mass with extension to the posterior gastric antrum Cycle 3 gemcitabine/Abraxane 04/04/2022-dose reduction secondary to thrombocytopenia CT abdomen/pelvis 04/14/2021-increase size of the pancreas neck/body mass with probable involvement of the gastric wall, progressive encasement of the celiac artery with chronic involvement of the portal splenic confluence Pain secondary to #1-currently controlled managed with MS Contin and Dilaudid Celiac plexus block 03/11/2022 Weight loss secondary to #1 History of situational depression Family history of breast cancer 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  She is currently being treated with guideline gemcitabine/Abraxane.  Her clinical status has declined while on the current treatment and a CT 04/15/2022 reveals enlargement of the primary tumor.  She has no evidence of systemic progression at present.  I recommend a radiation oncology referral to consider a course of palliative Xeloda and radiation.  We will consult radiation oncology today.  She does not wish to consider a repeat celiac block, but  She will continue MS Contin for pain.  I will add oral Dilaudid for breakthrough pain.  She has been started on prednisone.  I think it is reasonable to  continue prednisone as an adjunct pain medication and appetite stimulant.    She will continue a bowel regimen for narcotic induced constipation.  Recommendations: 1.  Radiation consult to consider a course of palliative radiation and Xeloda 2.  Continue MS Contin, add oral Dilaudid for breakthrough pain 3.  Continue prednisone  4.  Bowel regimen as recommended by Dr. Collene Mares    LOS: 1 day   Betsy Coder, MD   04/17/2022, 1:20 PM

## 2022-04-17 NOTE — Progress Notes (Signed)
Referral placed for Radiation oncology for consult for concurrent xeloda/radiation per Dr Benay Spice

## 2022-04-17 NOTE — Assessment & Plan Note (Addendum)
-  This is her most pressing issue -Almost certainly due to cancer-associated pain -Palliative care consulted for GOC as well as symptom management -Given spread of her cancer in conjunction with worsening pain and constipation and overall deteriorating quality of life, transition to hospice may be in her near future unfortunately - she is aware -She was on MS Contin PTA and this has been increased from 30 to 45 mg BID with some improvement -She was on PO Dilaudid 2-4 mg PO q4h PTA and this has been increased to 4-'6mg'$  -Still supplementing with IV Dilaudid - but the goal would be to decrease and then eliminate this so that she can move towards discharge -She is also on prednisone as an adjunct pain medication

## 2022-04-18 ENCOUNTER — Other Ambulatory Visit: Payer: Self-pay

## 2022-04-18 DIAGNOSIS — R52 Pain, unspecified: Secondary | ICD-10-CM

## 2022-04-18 MED ORDER — DIPHENHYDRAMINE HCL 12.5 MG/5ML PO ELIX: 12.5000 mg | ORAL_SOLUTION | Freq: Four times a day (QID) | ORAL | Status: DC | PRN

## 2022-04-18 MED ORDER — NALOXONE HCL 0.4 MG/ML IJ SOLN: 0.4000 mg | INTRAMUSCULAR | Status: DC | PRN

## 2022-04-18 MED ORDER — HYDROMORPHONE HCL-NACL 50-0.9 MG/50ML-% IV SOLN
1.0000 mg/h | INTRAVENOUS | Status: DC
  Filled 2022-04-18: qty 50

## 2022-04-18 MED ORDER — SODIUM CHLORIDE 0.9% FLUSH: 9.0000 mL | INTRAVENOUS | Status: DC | PRN

## 2022-04-18 MED ORDER — MODAFINIL 200 MG PO TABS
100.0000 mg | ORAL_TABLET | Freq: Every day | ORAL | Status: DC
  Administered 2022-04-19 – 2022-04-25 (×7): 100 mg via ORAL
  Filled 2022-04-18 (×7): qty 1

## 2022-04-18 MED ORDER — HYDROMORPHONE HCL 1 MG/ML IJ SOLN: 1.0000 mg | INTRAMUSCULAR | Status: DC | PRN

## 2022-04-18 MED ORDER — HYDROMORPHONE HCL 2 MG/ML IJ SOLN
2.0000 mg | INTRAMUSCULAR | Status: DC | PRN
  Administered 2022-04-21 – 2022-04-22 (×2): 2 mg via INTRAVENOUS
  Filled 2022-04-18 (×2): qty 1

## 2022-04-18 MED ORDER — HYDROMORPHONE 1 MG/ML IV SOLN
INTRAVENOUS | Status: DC
  Administered 2022-04-18: 30 mg via INTRAVENOUS
  Administered 2022-04-19: 2.67 mg via INTRAVENOUS
  Administered 2022-04-19: 1.24 mg via INTRAVENOUS
  Administered 2022-04-19: 2.22 mg via INTRAVENOUS
  Filled 2022-04-18: qty 30

## 2022-04-18 MED ORDER — DIPHENHYDRAMINE HCL 50 MG/ML IJ SOLN: 12.5000 mg | Freq: Four times a day (QID) | INTRAMUSCULAR | Status: DC | PRN

## 2022-04-18 MED ORDER — LORAZEPAM 0.5 MG PO TABS: 0.5000 mg | ORAL_TABLET | ORAL | Status: DC | PRN

## 2022-04-18 MED ORDER — DULOXETINE HCL 20 MG PO CPEP
20.0000 mg | ORAL_CAPSULE | Freq: Every day | ORAL | Status: DC
  Administered 2022-04-18 – 2022-04-23 (×5): 20 mg via ORAL
  Filled 2022-04-18 (×6): qty 1

## 2022-04-18 MED ORDER — ONDANSETRON HCL 4 MG/2ML IJ SOLN: 4.0000 mg | Freq: Four times a day (QID) | INTRAMUSCULAR | Status: DC | PRN

## 2022-04-18 MED ORDER — SODIUM CHLORIDE 0.9 % IV SOLN
1.0000 mg/h | INTRAVENOUS | Status: DC
  Administered 2022-04-18: 1 mg/h via INTRAVENOUS
  Filled 2022-04-18: qty 5

## 2022-04-18 MED ORDER — TRAZODONE HCL 50 MG PO TABS
25.0000 mg | ORAL_TABLET | Freq: Every day | ORAL | Status: DC
  Administered 2022-04-18: 25 mg via ORAL
  Filled 2022-04-18: qty 1

## 2022-04-18 NOTE — Plan of Care (Signed)

## 2022-04-18 NOTE — Progress Notes (Addendum)
IP PROGRESS NOTE  Subjective:   Tara Jensen complains of severe abdomen/back pain, not relieved with current pain regimen.  No further bowel movement.  Objective: Vital signs in last 24 hours: Blood pressure 127/69, pulse 68, temperature 99.3 F (37.4 C), temperature source Oral, resp. rate 16, height '5\' 10"'$  (1.778 m), weight 101 lb 13.6 oz (46.2 kg), last menstrual period 11/01/2018, SpO2 99 %.  Intake/Output from previous day: 03/06 0701 - 03/07 0700 In: 390 [P.O.:340; IV Piggyback:50] Out: -   Physical Exam:  Abdomen: Mildly distended, no hepatosplenomegaly, no mass Extremities: No leg edema   Portacath/PICC-without erythema  Lab Results: Recent Labs    04/16/22 0515 04/16/22 1102  WBC 5.1 5.1  HGB 12.4 12.4  HCT 38.9 38.9  PLT 147* 142*    BMET Recent Labs    04/16/22 0515 04/16/22 1102  NA 137 133*  K 4.1 3.9  CL 100 102  CO2 29 28  GLUCOSE 104* 138*  BUN 13 15  CREATININE 0.48 0.43*  CALCIUM 9.3 8.8*    Lab Results  Component Value Date   [id number] 2,544 (H) 04/02/2022    Medications: I have reviewed the patient's current medications.  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  Less than 180 degree abutment of the distal celiac, common hepatic, and splenic arteries, less than 180 degree abutment of the SMA CT chest 07/18/2021-no evidence of metastatic disease EUS 07/16/2021-extrinsic impression on the stomach at the posterior wall of the gastric body, no gross lesion in the duodenum, 45 x 36 mm pancreas body mass, abutment of the SMA, celiac trunk, and compression of the splenoportal confluence.  No malignant appearing lymph nodes, numerous venous collaterals adjacent to the portal vein, FNA biopsy-malignant cells consistent with  adenocarcinoma, T4N0 by EUS Markedly elevated CA 19-9 Guardant360 07/31/2021-K-ras G12R, MSI high-not detected, ATM VUS Cycle 1 FOLFOX 08/08/2021 Cycle 2 FOLFIRINOX 08/22/2021 CT abdomen/pelvis at [facility] 08/31/2021-hypoattenuating liver lesions consistent with metastases, mass in the pancreas neck/body with greater than 100 degree encasement of the celiac axis and common hepatic artery.  Less than 180 degree abutment of the SMA, long segment occlusion of the portal splenic confluence, splenic vein occluded, multiple collateral vessels in the upper abdomen, prominent left periaortic node Cycle 3 FOLFIRINOX 09/05/2021 Cycle 4 FOLFIRINOX 09/19/2021 Cycle 5 FOLFIRINOX 10/03/2021 CTs 10/16/2021-decrease size of primary pancreas mass, hepatic metastases, and a suspicious retroperitoneal node, resolution of left supraclavicular/low jugular adenopathy compared to a CT chest 07/18/2021.  No evidence of disease progression. Cycle 6 FOLFIRINOX 10/17/2021 Cycle 7 FOLFIRINOX 10/31/2021 Cycle 8 FOLFIRINOX 11/14/2021 Cycle 9 FOLFIRINOX 11/28/2021 Cycle 10 FOLFIRINOX 12/12/2021, oxaliplatin held secondary to neuropathy symptoms CTs 12/20/2021-decrease size of pancreas primary, no evidence of hepatic metastases, no new or progressive disease Cycle 11 FOLFIRINOX 12/27/2021, oxaliplatin held due to neuropathy symptoms Cycle 12 FOLFIRINOX 01/09/2022, oxaliplatin held 01/22/2022-C19-9 higher, 530 Cycle 13 FOLFIRINOX 01/24/2022, oxaliplatin resumed Cycle 14 FOLFIRINOX 02/06/2022 CTs 02/23/2022-increased size of the pancreas body mass new focal gastric wall thickening and loss of fat plane between the mass and the proximal gastric wall, no evidence of new or progressive metastatic disease Cycle 1 gemcitabine/Abraxane 03/06/2022 Cycle 2 gemcitabine/Abraxane 03/20/2022 CT abdomen/pelvis was 03/20/2022-slight interval increase in size of the pancreas neck/body mass with extension to the posterior gastric antrum Cycle 3 gemcitabine/Abraxane  04/04/2022-dose reduction secondary to thrombocytopenia  CT abdomen/pelvis 04/14/2021-increase size of the pancreas neck/body mass with probable involvement of the gastric wall, progressive encasement of the celiac artery with chronic involvement of the portal splenic confluence Pain secondary to #1-Dilaudid drip started 04/18/2022 Celiac plexus block 03/11/2022 Weight loss secondary to #1 History of situational depression Family history of breast cancer 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  She is currently being treated with guideline gemcitabine/Abraxane.  Her clinical status has declined while on the current treatment and a CT 04/15/2022 reveals enlargement of the primary tumor.  She has no evidence of systemic progression at present.  She has increased abdomen/back pain, not relieved with the current narcotic regimen.  We consulted radiation oncology to consider palliative radiation to the pancreas mass.  She has not been seen by radiation as of yet.  Tara Jensen has severe pain this morning.  This is the chief acute issue.  The pain is not controlled with the current narcotic regimen.  I recommend beginning a Dilaudid infusion/bolus to get her pain under adequate control and assess the narcotic requirement.  Recommendations: 1.  Radiation consult to consider a course of palliative radiation and Xeloda 2.  Discontinue MS Contin and oral Dilaudid 3.  Continue prednisone  4.  Bowel regimen as recommended by Dr. Collene Mares 5.  Begin Dilaudid drip/bolus for pain 6.  Agree with palliative care consult    LOS: 2 days   Tara Coder, MD   04/18/2022, 1:09 PM

## 2022-04-18 NOTE — Consult Note (Signed)
Consultation Note Date: 04/18/2022   Patient Name: Tara Jensen  DOB: [birth year]  MRN: [id number]  Age / Sex: 57 y.o., female  PCP: Chesley Noon, MD Referring Physician: Edwin Dada, *  Reason for Consultation:  Symptom management, metastatic pancreatic cancer  HPI/Patient Profile: 57 y.o. female  with past medical history of GERD, anxiety, depression, constipation, chemo induced peripheral neuropathy, pancreatic cancer metastatic to the liver and gastric wall, encasing the celiac artery s/p Folfirinox, last received Gemzar/abraxane 04/04/22, admitted on 04/15/2022 with severe abdominal pain. CT scan this admission shows tumor has progressed. Rad Onc consulted for possible radiation and xeloda. Celiac plexus block has also been recommended. Palliative medicine consulted for assistance with symptom management.    Primary Decision Maker PATIENT  Discussion:  Chart reviewed including labs, progress notes, imaging from this and previous encounters.  Met with Ms. Springborn at the bedside. Very pleasant, oriented, tired. She did not sleep well last night due to discomfort.  She is a Management consultant.  Last 2 years have been very difficult physically and existentially.  She has support from friends and her church community.  Her current goals are to find relief of her pain, but not be oversedated, she wants to continue to be able to be functional. She lives independently. We discussed how existential pain and physical pain exist and influence one another. We discussed her symptoms and management options. She is hesitant to proceed with celiac plexus block as it was unsuccessful when it was attempted in January.  She also has concerns about side effects from radiation vs the benefit that it will provide.  She has a close friend who would like to be present for further discussion regarding her goals of  care and treatment options.    SUMMARY OF RECOMMENDATIONS -Start hydromorphone PCA - based on last 24 hours OME will start with basal rate at 0.3 mg per hour and patient bolus dose 0.5 with 20 minute lockout -Hydromorphone IV '2mg'$  q2hr prn for rescue if PCA is ineffective -Cymbalta '20mg'$  po daily  -Trazadone '25mg'$  QHS for sleep -Lorazepam 0.'5mg'$  po q4hr prn for anxiety, sleep and pain adjuvant -Continue prednisone '20mg'$  daily -Start Provigil '100mg'$  po tomorrow -Dr. Collene Mares is managing her constipation- she is getting dose of relistor today and plan to start symproic -Plan to meet tomorrow at 1030 with her and her friend for further discussion  Code Status/Advance Care Planning: Full code   Prognosis:   Unable to determine  Discharge Planning: To Be Determined  Primary Diagnoses: Present on Admission:  Pancytopenia ([place])  Cancer of pancreas, body ([place])  Protein-calorie malnutrition, severe  Therapeutic opioid-induced constipation (OIC)  Depression  Intractable pain   Review of Systems  Constitutional:  Positive for fatigue.  Gastrointestinal:  Positive for constipation.    Physical Exam Vitals and nursing note reviewed.  Constitutional:      Comments: thin  Pulmonary:     Effort: Pulmonary effort is normal.  Neurological:     Mental Status: She is alert  and oriented to person, place, and time.  Psychiatric:        Mood and Affect: Mood normal.        Behavior: Behavior normal.        Thought Content: Thought content normal.        Judgment: Judgment normal.     Vital Signs: BP 127/69 (BP Location: Right Arm)   Pulse 68   Temp 99.3 F (37.4 C) (Oral)   Resp 17   Ht '5\' 10"'$  (1.778 m)   Wt 46.2 kg   LMP 11/01/2018   SpO2 99%   BMI 14.61 kg/m  Pain Scale: 0-10   Pain Score: 8    SpO2: SpO2: 99 % O2 Device:SpO2: 99 % O2 Flow Rate: .   IO: Intake/output summary:  Intake/Output Summary (Last 24 hours) at 04/18/2022 1131 Last data filed at 04/18/2022 0539 Gross  per 24 hour  Intake 390 ml  Output --  Net 390 ml    LBM: Last BM Date : 04/16/22 Baseline Weight: Weight: 46 kg Most recent weight: Weight: 46.2 kg       Thank you for this consult. Palliative medicine will continue to follow and assist as needed.  Time Total: 120 minutes Greater than 50%  of this time was spent counseling and coordinating care related to the above assessment and plan.  Signed by: Mariana Kaufman, AGNP-C Palliative Medicine    Please contact Palliative Medicine Team phone at [phone] for questions and concerns.  For individual provider: See Shea Evans

## 2022-04-18 NOTE — Progress Notes (Signed)
  Progress Note   Patient: Tara Jensen [id number] DOB: 08-17-1965 DOA: 04/15/2022     2 DOS: the patient was seen and examined on 04/18/2022        Brief hospital course: Mrs. Hannemann is a 57 y.o. F with hx pancreatic CA metastatic to liver who presented with worsening abdominal pain, N/V.        Assessment and Plan: * Intractable pain - Per Oncology and Palliative Care    Depression - Continue sertraline, Cymbalta, trazodone, Ativan  Therapeutic opioid-induced constipation (OIC) - Continue Relistor  Pancytopenia ([facility]) Lab error on admission, was transfused as a result.  Counts stable.  Protein-calorie malnutrition, severe    Cancer of pancreas, body ([place]) - Consult Oncology          Subjective: No change.     Physical Exam: BP 127/69 (BP Location: Right Arm)   Pulse 68   Temp 99.3 F (37.4 C) (Oral)   Resp 16   Ht '5\' 10"'$  (1.778 m)   Wt 46.2 kg   LMP 11/01/2018   SpO2 99%   BMI 14.61 kg/m   Thin adult female, lying in bed, appears tired RRR no murmurs, no edema Attention normal, oriented, affect normal, generally weak but symetric  Data Reviewed: None new  Family Communication: Declined    Disposition: Status is: Inpatient         Author: Edwin Dada, MD 04/18/2022 12:17 PM  For on call review [email or web address].

## 2022-04-19 ENCOUNTER — Ambulatory Visit
Admit: 2022-04-19 | Discharge: 2022-04-19 | Disposition: A | Discharge status: Home / Self Care | Payer: BC Managed Care – PPO | Attending: Radiation Oncology | Admitting: Radiation Oncology

## 2022-04-19 DIAGNOSIS — R52 Pain, unspecified: Secondary | ICD-10-CM | POA: Diagnosis not present

## 2022-04-19 DIAGNOSIS — C251 Malignant neoplasm of body of pancreas: Secondary | ICD-10-CM

## 2022-04-19 DIAGNOSIS — F32A Depression, unspecified: Secondary | ICD-10-CM | POA: Diagnosis not present

## 2022-04-19 LAB — COMPREHENSIVE METABOLIC PANEL
ALT: 13 U/L (ref 0–44)
AST: 12 U/L — ABNORMAL LOW (ref 15–41)
Albumin: 3.6 g/dL (ref 3.5–5.0)
Alkaline Phosphatase: 66 U/L (ref 38–126)
Anion gap: 6 (ref 5–15)
BUN: 21 mg/dL — ABNORMAL HIGH (ref 6–20)
CO2: 29 mmol/L (ref 22–32)
Calcium: 8.7 mg/dL — ABNORMAL LOW (ref 8.9–10.3)
Chloride: 98 mmol/L (ref 98–111)
Creatinine, Ser: 0.36 mg/dL — ABNORMAL LOW (ref 0.44–1.00)
GFR, Estimated: >60 mL/min (ref >60)
Glucose, Bld: 165 mg/dL — ABNORMAL HIGH (ref 70–99)
Potassium: 3.4 mmol/L — ABNORMAL LOW (ref 3.5–5.1)
Sodium: 133 mmol/L — ABNORMAL LOW (ref 135–145)
Total Bilirubin: 0.4 mg/dL (ref 0.3–1.2)
Total Protein: 6.8 g/dL (ref 6.5–8.1)

## 2022-04-19 LAB — MAGNESIUM: Magnesium: 2.1 mg/dL (ref 1.7–2.4)

## 2022-04-19 MED ORDER — TRAZODONE HCL 50 MG PO TABS
50.0000 mg | ORAL_TABLET | Freq: Every day | ORAL | Status: DC
  Administered 2022-04-19 – 2022-04-24 (×6): 50 mg via ORAL
  Filled 2022-04-19 (×6): qty 1

## 2022-04-19 MED ORDER — DEXAMETHASONE SODIUM PHOSPHATE 10 MG/ML IJ SOLN
8.0000 mg | INTRAMUSCULAR | Status: DC
  Administered 2022-04-19 – 2022-04-24 (×6): 8 mg via INTRAVENOUS
  Filled 2022-04-19 (×6): qty 1

## 2022-04-19 MED ORDER — NALOXONE HCL 0.4 MG/ML IJ SOLN: 0.4000 mg | INTRAMUSCULAR | Status: DC | PRN

## 2022-04-19 MED ORDER — FENTANYL 50 MCG/ML IV PCA SOLN
INTRAVENOUS | Status: DC
  Administered 2022-04-19: 117.5 ug via INTRAVENOUS
  Administered 2022-04-19: 225.4 ug/h via INTRAVENOUS
  Administered 2022-04-20: 143.8 ug via INTRAVENOUS
  Administered 2022-04-20: 140.7 ug via INTRAVENOUS
  Filled 2022-04-19: qty 25

## 2022-04-19 MED ORDER — SODIUM CHLORIDE 0.9% FLUSH: 9.0000 mL | INTRAVENOUS | Status: DC | PRN

## 2022-04-19 MED ORDER — CELECOXIB 200 MG PO CAPS
200.0000 mg | ORAL_CAPSULE | Freq: Two times a day (BID) | ORAL | Status: DC
  Administered 2022-04-19 – 2022-04-25 (×12): 200 mg via ORAL
  Filled 2022-04-19 (×13): qty 1

## 2022-04-19 NOTE — Progress Notes (Signed)
Palliative Care Follow-up Note and Goals of Care Meeting  Met with Tara Jensen along with her HCPOAs at bedside today. We discussed 1.)Pain and Symptom Management Plan 2.) Advance Care Planning including a discussion on resources and care choices, including EOL care preferences. I introduced the concept of hospice and what that service could add to her care as well as limitations.  Tara Jensen is engaged in her care and in understanding her cancer-she is realistic about her journey and trying to prepare and plan. Ultimately her goals are to face EOL and her terminal trajectory with as much "grace" as possible- to have better pain and symptom management so that she can live more fully for the time she has left. She has very high levels of grief and unspoken existential suffering but openly acknowledges her pain -the loss of her son and the loss of health from cancer and eventual loss of her life.  Her pain management has been difficult. She had a previous CPB in 1/24 by IR. Following the procedure she had 4-6 hours of complete pain relief -but her pain returned after this. She struggles with severe post prandial pain, burning and generalized abdominal discomfort constantly. She had relief from constipation immediately with Relistor.  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   Summary of Recommendations:  ACP documents reviewed and are on file. Established DNR order. Will complete [facility] and send original with her at time of discharge. I strongly recommended that she be seen and receive palliative care services through our palliative care oncology clinic at [facility]. I explained the difference between [facility] and [facility] program. She needs complex prescribing and symptom management services as well as provider monitoring from our clinic and home based supports from [facility]  team. Symptom Management Recommendations: Cancer Related Pain Change PCA to Fentanyl and Transition to TD in 24 hours based on use an symptom burden. Add Celebrex '200mg'$  BID Add Decadron, '8mg'$  IV q24 Titrate Cymbalta to '60mg'$  as tolerated-stop Zoloft Maintain Gabapentin at current dose,but we can go up on this if needed. We discussed Lateral Lying position and prone position after PO intake Recommend repeat Celiac Plexus Block and also Splanchnic nerve block If above interventions do not achieve pain control we have several other options available including methadone and ketamine. Can also trial Octreotide if above plan is not effective.  Cancer Related Fatigue Started Provigil this AM Constipation Appreciate GI assistance- Relistor was extremely effective. Will need to continue QOD dosing. Disposition Planning: We discussed Hospice Services- this depends of whether or not she is going to continue Chemotherapy. Radiation and/or IR procedures for pain control will not effect her hospice eligibility.     Will follow this patient closely.  Lane Hacker, DO Palliative Medicine  120 minutes spent at bedside and in coordination and communication related to recommendations.

## 2022-04-19 NOTE — Progress Notes (Signed)
IP PROGRESS NOTE  Subjective:   Tara Jensen  was started on Dilaudid PCA yesterday.  She had a bowel movement after Relistor this morning.  She reports improvement in pain control though she continues to have significant abdomen and back pain.  She is reluctant to use the PCA breakthrough pain.  Objective: Vital signs in last 24 hours: Blood pressure 119/80, pulse 82, temperature 98.4 F (36.9 C), temperature source Oral, resp. rate 16, height '5\' 10"'$  (1.778 m), weight 101 lb 13.6 oz (46.2 kg), last menstrual period 11/01/2018, SpO2 96 %.  Intake/Output from previous day: 03/07 0701 - 03/08 0700 In: 238 [P.O.:100; I.V.:138] Out: -   Physical Exam:  Abdomen: Mildly distended, no hepatosplenomegaly, no mass Extremities: No leg edema   Portacath/PICC-without erythema   Lab Results  Component Value Date   [id number] 2,544 (H) 04/02/2022    Medications: I have reviewed the patient's current medications.  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  Less than 180 degree abutment of the distal celiac, common hepatic, and splenic arteries, less than 180 degree abutment of the SMA CT chest 07/18/2021-no evidence of metastatic disease EUS 07/16/2021-extrinsic impression on the stomach at the posterior wall of the gastric body, no gross lesion in the duodenum, 45 x 36 mm pancreas body mass, abutment of the SMA, celiac trunk, and compression of the splenoportal confluence.  No malignant appearing lymph nodes, numerous venous collaterals adjacent to the portal vein, FNA biopsy-malignant cells consistent with adenocarcinoma, T4N0 by EUS Markedly elevated CA 19-9 Guardant360 07/31/2021-K-ras G12R, MSI high-not detected, ATM VUS Cycle 1 FOLFOX 08/08/2021 Cycle 2 FOLFIRINOX 08/22/2021 CT  ab[facility]vis at [facility] 08/31/2021-hypoattenuating liver lesions consistent with metastases, mass in the pancreas neck/body with greater than 100 degree encasement of the celiac axis and common hepatic artery.  Less than 180 degree abutment of the SMA, long segment occlusion of the portal splenic confluence, splenic vein occluded, multiple collateral vessels in the upper abdomen, prominent left periaortic node Cycle 3 FOLFIRINOX 09/05/2021 Cycle 4 FOLFIRINOX 09/19/2021 Cycle 5 FOLFIRINOX 10/03/2021 CTs 10/16/2021-decrease size of primary pancreas mass, hepatic metastases, and a suspicious retroperitoneal node, resolution of left supraclavicular/low jugular adenopathy compared to a CT chest 07/18/2021.  No evidence of disease progression. Cycle 6 FOLFIRINOX 10/17/2021 Cycle 7 FOLFIRINOX 10/31/2021 Cycle 8 FOLFIRINOX 11/14/2021 Cycle 9 FOLFIRINOX 11/28/2021 Cycle 10 FOLFIRINOX 12/12/2021, oxaliplatin held secondary to neuropathy symptoms CTs 12/20/2021-decrease size of pancreas primary, no evidence of hepatic metastases, no new or progressive disease Cycle 11 FOLFIRINOX 12/27/2021, oxaliplatin held due to neuropathy symptoms Cycle 12 FOLFIRINOX 01/09/2022, oxaliplatin held 01/22/2022-C19-9 higher, 530 Cycle 13 FOLFIRINOX 01/24/2022, oxaliplatin resumed Cycle 14 FOLFIRINOX 02/06/2022 CTs 02/23/2022-increased size of the pancreas body mass new focal gastric wall thickening and loss of fat plane between the mass and the proximal gastric wall, no evidence of new or progressive metastatic disease Cycle 1 gemcitabine/Abraxane 03/06/2022 Cycle 2 gemcitabine/Abraxane 03/20/2022 CT abdomen/pelvis was 03/20/2022-slight interval increase in size of the pancreas neck/body mass with extension to the posterior gastric antrum Cycle 3 gemcitabine/Abraxane 04/04/2022-dose reduction secondary to thrombocytopenia CT abdomen/pelvis 04/14/2021-increase size of the pancreas neck/body mass with probable involvement of the gastric wall,  progressive encasement of the celiac artery with chronic involvement of the portal splenic confluence Pain secondary to #1-Dilaudid drip started 04/18/2022 Celiac plexus block 03/11/2022 Weight loss secondary to #1 History  of situational depression Family history of breast cancer 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 has a history of metastatic pancreas cancer.  She is currently being treated with guideline gemcitabine/Abraxane.  Her clinical status has declined while on the current treatment and a CT 04/15/2022 reveals enlargement of the primary tumor.  She has no evidence of systemic progression at present.  She has increased abdomen/back pain likely secondary to local progression of tumor.  The pain is under better control with a Dilaudid drip/bolus.   Tara Jensen scheduled to meet with palliative care later this morning to discuss pain control and quality of life issues.  We discussed palliative care and hospice.  She understands she will not be able to participate in a clinical trial or consider salvage chemotherapy if she enters hospice care.  Standard systemic options are limited and have an expected small chance of clinical benefit.  We discussed the potential role for palliative radiation/Xeloda with the goals of improving pain and limiting tumor progression.  Pain control remains the chief issue at present.  She will likely need to go home with a Dilaudid infusion/bolus, though she can discuss the possibility of an oral regimen with palliative care.    Recommendations: 1.  Pain management per palliative care medicine 2.  Home palliative care 3.  Continue prednisone  4.  Bowel regimen as recommended by Dr. Collene Mares 5.  Please call oncology as  needed, I will check on her 04/22/2022 if she remains in the hospital and outpatient follow-up will be scheduled at the Cancer center    LOS: 3 days   Betsy Coder, MD   04/19/2022, 2:07 PM

## 2022-04-19 NOTE — Progress Notes (Signed)
This is a no charge note.    Palliative Care met with the patient this morning, and when I spoke to her this afternoon, she had a high level of confidence in their plan to help her manage her pain.    No evaluation and management services were rendered, separate from those of Palliative Care to manage her pancreatic cancer and intractable cancer related pain.

## 2022-04-19 NOTE — Progress Notes (Addendum)
Patient ID: Tara Jensen, female   DOB: [birth year], 57 y.o.   MRN: [id number] Pt has agreed to proceed with celiac plexus neurolysis and case discussed again with Dr. Maryelizabeth Kaufmann. We will tent plan procedure for 3/13 pm at [facility]. Will f/u with pt again on 3/12 to finalize plans.

## 2022-04-19 NOTE — Progress Notes (Signed)
[facility] Liaison Note  This is a current patient with [facility] outpatient palliative.  We will follow for discharge disposition.  Please call with any questions.  Thank you, Margaretmary Eddy, BSN, RN [facility] Liaison [phone]

## 2022-04-19 NOTE — Consult Note (Signed)
Radiation Oncology         [phone] ________________________________  Name: Tara Jensen        MRN: [id number]  Date of Service: 04/19/22 DOB: 29-Apr-1965  [id number], Rebeca Alert, MD    REFERRING PHYSICIAN: Dr. Benay Spice   DIAGNOSIS: The primary encounter diagnosis was Symptomatic anemia. Diagnoses of Epigastric pain, Malignant neoplasm of pancreas, unspecified location of malignancy ([place]), Cancer of pancreas, body ([place]), and Intractable pain were also pertinent to this visit.   HISTORY OF PRESENT ILLNESS: Tara Jensen is a 57 y.o. female seen at the request of Dr. Benay Spice for diagnosis of progressive pancreatic cancer.  The patient was originally diagnosed in the summer 2023 when she presented with imaging indicating a mass in the body of the pancreas including this proximal splenic vein SMV and distal small filling defect of the SMV concerning for thrombus.  She had a periaortic lymph node and abutment of her distal celiac common hepatic and splenic arteries as well as SMA involvement.  By endoscopic ultrasound on 07/16/2021 and the mass measured 4.5 cm in the pancreatic body abutting the SMA, celiac trunk and compressing the splenoportal confluence no abnormal appearing nodes were noted and FNA showed malignant cells consistent with adenocarcinoma.  She began systemic chemotherapy in June 2023. Her regimen was switched from FOLFOX to FOLFIRINOX subsequently and she has received 14 cycles of this the most recent of that regimen being in December 2023 however there was progression in the mass and involving changes to the gastric wall.  She was switched to Gemzar Abraxane in January 2024 and interval imaging in February with CT abdomen pelvis on 03/20/2022 showed a slight interval increase in the mass with extension to the posterior gastric antrum.  Dose reduction in her Gemzar Abraxane was made due to thrombocytopenia and a repeat image on 04/15/2022 showed an increase again in the size of the mass  now measuring 5.7 cm by CT.  It is favored to involve the gastric wall, and progressive encasement of the celiac artery and portal splenic confluence was noted.  She was admitted that day due to intractable pain despite long and short acting narcotics.  She has been offered an additional celiac plexus block which was performed also in January 2024 but she did not have improvement from this.  She is declined to go through this again out of concern that this did not help her previously.  We have been asked to consider a course of radiotherapy with chemosensitization.   PREVIOUS RADIATION THERAPY: No   PAST MEDICAL HISTORY:  Past Medical History:  Diagnosis Date   Abnormal Pap smear    Anxiety    "situational"   BV (bacterial vaginosis)    Cancer (HCC)    pancreatic   Cervical polyp    Depression    "situational"   Family history of adverse reaction to anesthesia    mother had N/V   Recurrent UTI (urinary tract infection)        PAST SURGICAL HISTORY: Past Surgical History:  Procedure Laterality Date   BIOPSY  07/16/2021   Procedure: BIOPSY;  Surgeon: Mansouraty, Telford Nab., MD;  Location: [place];  Service: Gastroenterology;;   CERVICAL CONE BIOPSY  1996   ESOPHAGOGASTRODUODENOSCOPY (EGD) WITH PROPOFOL N/A 07/16/2021   Procedure: ESOPHAGOGASTRODUODENOSCOPY (EGD) WITH PROPOFOL;  Surgeon: Irving Copas., MD;  Location: [place];  Service: Gastroenterology;  Laterality: N/A;   FINE NEEDLE ASPIRATION  07/16/2021   Procedure: FINE NEEDLE ASPIRATION (FNA) LINEAR;  Surgeon: Irving Copas., MD;  Location: [place];  Service: Gastroenterology;;   IR RADIOLOGIST EVAL & MGMT  02/26/2022   LASIK     PORTACATH PLACEMENT Right 08/01/2021   Procedure: INSERTION PORT-A-CATH;  Surgeon: Dwan Bolt, MD;  Location: [place];  Service: General;  Laterality: Right;   UPPER ESOPHAGEAL ENDOSCOPIC ULTRASOUND (EUS) N/A 07/16/2021   Procedure: UPPER ESOPHAGEAL ENDOSCOPIC ULTRASOUND  (EUS);  Surgeon: Irving Copas., MD;  Location: [place];  Service: Gastroenterology;  Laterality: N/A;     FAMILY HISTORY:  Family History  Problem Relation Age of Onset   Depression Mother    Depression Brother    Stroke Maternal Grandmother    Hypertension Maternal Grandfather    Breast cancer Cousin 76       maternal first cousin   Colon cancer Neg Hx    Esophageal cancer Neg Hx    Rectal cancer Neg Hx    Stomach cancer Neg Hx      SOCIAL HISTORY:  reports that she has never smoked. She has never used smokeless tobacco. She reports that she does not currently use alcohol. She reports that she does not use drugs.   ALLERGIES: Patient has no known allergies.   MEDICATIONS:  Current Facility-Administered Medications  Medication Dose Route Frequency Provider Last Rate Last Admin   0.9 %  sodium chloride infusion (Manually program via Guardrails IV Fluids)   Intravenous Once Agbata, Tochukwu, MD       Chlorhexidine Gluconate Cloth 2 % PADS 6 each  6 each Topical Daily Agbata, Tochukwu, MD   6 each at 04/18/22 1117   diphenhydrAMINE (BENADRYL) injection 12.5 mg  12.5 mg Intravenous Q6H PRN Earlie Counts, NP       Or   diphenhydrAMINE (BENADRYL) 12.5 MG/5ML elixir 12.5 mg  12.5 mg Oral Q6H PRN Earlie Counts, NP       DULoxetine (CYMBALTA) DR capsule 20 mg  20 mg Oral Daily Mariana Kaufman J, NP   20 mg at 04/18/22 1318   enoxaparin (LOVENOX) injection 40 mg  40 mg Subcutaneous Q24H Barb Merino, MD   40 mg at 04/18/22 1626   famotidine (PEPCID) tablet 40 mg  40 mg Oral BID Karmen Bongo, MD   40 mg at 04/18/22 2114   feeding supplement (ENSURE ENLIVE / ENSURE PLUS) liquid 237 mL  237 mL Oral BID BM Agbata, Tochukwu, MD   237 mL at 04/18/22 1526   gabapentin (NEURONTIN) capsule 400 mg  400 mg Oral TID Barb Merino, MD   400 mg at 04/18/22 2114   HYDROmorphone (DILAUDID) 1 mg/mL PCA injection   Intravenous Q4H Mariana Kaufman J, NP   2.22 mg at 04/19/22 0430    HYDROmorphone (DILAUDID) injection 2 mg  2 mg Intravenous Q2H PRN Earlie Counts, NP       lidocaine-prilocaine (EMLA) cream 1 Application  1 Application Topical PRN Agbata, Tochukwu, MD       LORazepam (ATIVAN) tablet 0.5 mg  0.5 mg Oral Q4H PRN Earlie Counts, NP       methylnaltrexone (RELISTOR) injection 12 mg  12 mg Subcutaneous Q48H PRN Karmen Bongo, MD   12 mg at 04/19/22 0656   modafinil (PROVIGIL) tablet 100 mg  100 mg Oral Q breakfast Earlie Counts, NP       multivitamin with minerals tablet 1 tablet  1 tablet Oral Daily Agbata, Tochukwu, MD   1 tablet at 04/18/22 1115   naloxone (NARCAN) injection  0.4 mg  0.4 mg Intravenous PRN Earlie Counts, NP       And   sodium chloride flush (NS) 0.9 % injection 9 mL  9 mL Intravenous PRN Earlie Counts, NP       ondansetron (ZOFRAN) injection 4 mg  4 mg Intravenous Q6H PRN Earlie Counts, NP       Oral care mouth rinse  15 mL Mouth Rinse PRN Agbata, Tochukwu, MD       pantoprazole (PROTONIX) EC tablet 40 mg  40 mg Oral 2 times per day Minda Ditto, RPH   40 mg at 04/18/22 1115   predniSONE (DELTASONE) tablet 20 mg  20 mg Oral Q breakfast Agbata, Tochukwu, MD   20 mg at 04/18/22 1115   promethazine (PHENERGAN) 12.5 mg in sodium chloride 0.9 % 50 mL IVPB  12.5 mg Intravenous Q6H PRN Regan Lemming, MD   Stopped at 04/18/22 0229   sertraline (ZOLOFT) tablet 25 mg  25 mg Oral Q24H Minda Ditto, RPH   25 mg at 04/18/22 2115   simethicone (MYLICON) chewable tablet 80 mg  80 mg Oral QID PRN Karmen Bongo, MD   80 mg at 04/17/22 1036   traZODone (DESYREL) tablet 25 mg  25 mg Oral QHS Earlie Counts, NP   25 mg at 04/18/22 2114     REVIEW OF SYSTEMS: On review of systems, the patient ***     PHYSICAL EXAM:  Wt Readings from Last 3 Encounters:  04/15/22 101 lb 13.6 oz (46.2 kg)  04/02/22 101 lb 9.6 oz (46.1 kg)  03/20/22 105 lb (47.6 kg)   Temp Readings from Last 3 Encounters:  04/19/22 98.3 F (36.8 C) (Oral)  04/04/22 98.7 F (37.1  C) (Oral)  04/02/22 98.2 F (36.8 C) (Oral)   BP Readings from Last 3 Encounters:  04/19/22 130/67  04/04/22 128/81  04/02/22 114/69   Pulse Readings from Last 3 Encounters:  04/19/22 71  04/04/22 69  04/02/22 74   Pain Assessment Pain Score: 4 /10  Unable to assess due to encounter type.    ECOG = ***  0 - Asymptomatic (Fully active, able to carry on all predisease activities without restriction)  1 - Symptomatic but completely ambulatory (Restricted in physically strenuous activity but ambulatory and able to carry out work of a light or sedentary nature. For example, light housework, office work)  2 - Symptomatic, <50% in bed during the day (Ambulatory and capable of all self care but unable to carry out any work activities. Up and about more than 50% of waking hours)  3 - Symptomatic, >50% in bed, but not bedbound (Capable of only limited self-care, confined to bed or chair 50% or more of waking hours)  4 - Bedbound (Completely disabled. Cannot carry on any self-care. Totally confined to bed or chair)  5 - Death   Eustace Pen MM, Creech RH, Tormey DC, et al. [phone]). "Toxicity and response criteria of the [facility]". [place] Oncol. 5 (6): 649-55    LABORATORY DATA:  Lab Results  Component Value Date   WBC 5.1 04/16/2022   HGB 12.4 04/16/2022   HCT 38.9 04/16/2022   MCV 100.0 04/16/2022   PLT 142 (L) 04/16/2022   Lab Results  Component Value Date   NA 133 (L) 04/16/2022   K 3.9 04/16/2022   CL 102 04/16/2022   CO2 28 04/16/2022   Lab Results  Component Value Date   ALT  14 04/16/2022   AST 15 04/16/2022   ALKPHOS 64 04/16/2022   BILITOT 0.5 04/16/2022      RADIOGRAPHY: CT ABDOMEN PELVIS W CONTRAST  Result Date: 04/15/2022 CLINICAL DATA:  Generalized abdominal pain radiating to the back worsening over the last week. History of pancreatic cancer. * Tracking Code: BO * EXAM: CT ABDOMEN AND PELVIS WITH CONTRAST TECHNIQUE:  Multidetector CT imaging of the abdomen and pelvis was performed using the standard protocol following bolus administration of intravenous contrast. RADIATION DOSE REDUCTION: This exam was performed according to the departmental dose-optimization program which includes automated exposure control, adjustment of the mA and/or kV according to patient size and/or use of iterative reconstruction technique. CONTRAST:  79m OMNIPAQUE IOHEXOL 350 MG/ML SOLN COMPARISON:  Multiple priors including most recent CT March 20, 2022 FINDINGS: Lower chest: No acute abnormality. Hepatobiliary: No suspicious hepatic lesion. Gallbladder is unremarkable. Biliary tree measures upper limits of normal at 6 mm, unchanged from prior. Pancreas: Heterogeneous lesion in the pancreatic neck/body junction measures 5.7 x 4.2 cm on image 25/2 previously 4.8 x 3.6 cm when remeasured for consistency. Similar atrophy and ductal dilation in the pancreatic tail. Spleen: No splenomegaly. Adrenals/Urinary Tract: Bilateral adrenal glands appear normal. No hydronephrosis. Kidneys demonstrate symmetric enhancement and excretion of contrast material. Urinary bladder is unremarkable for degree of distension. Stomach/Bowel: Similar loss of fat plane between the pancreatic mass in the gastric antral wall with gastric wall thickening in this area for instance on image 28/2. Similar moderate distension of the stomach with gas and fluid. No evidence of bowel obstruction. Moderate volume of formed stool throughout the colon. Vascular/Lymphatic: Normal caliber abdominal aorta. 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 Abutment of the SMA. 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 Reproductive: Uterus and bilateral adnexa are unremarkable. Other: Trace pelvic free fluid. No discrete peritoneal or omental nodularity Musculoskeletal: No aggressive lytic or blastic lesion of bone. IMPRESSION: 1. Increased size  of the pancreatic neck/body mass with probable involvement of the gastric wall and similar moderate fluid distension of the stomach. 2. 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 3. Trace pelvic free fluid. No discrete peritoneal or omental nodularity. 4. Moderate volume of formed stool throughout the colon. Correlate for constipation. Electronically Signed   By: JDahlia BailiffM.D.   On: 04/15/2022 10:22   CT ABDOMEN PELVIS W CONTRAST  Result Date: 03/20/2022 CLINICAL DATA:  Lower abdominal pain EXAM: CT ABDOMEN AND PELVIS WITH CONTRAST TECHNIQUE: Multidetector CT imaging of the abdomen and pelvis was performed using the standard protocol following bolus administration of intravenous contrast. RADIATION DOSE REDUCTION: This exam was performed according to the departmental dose-optimization program which includes automated exposure control, adjustment of the mA and/or kV according to patient size and/or use of iterative reconstruction technique. CONTRAST:  620mOMNIPAQUE IOHEXOL 300 MG/ML  SOLN COMPARISON:  CT 02/23/2022, 12/20/2021 FINDINGS: Lower chest: Lung bases demonstrate no acute airspace disease. Hepatobiliary: No focal liver abnormality is seen. No gallstones, gallbladder wall thickening, or biliary dilatation. Pancreas: Slight interval increase in size of hypodense mass involving the pancreatic neck and body, this measures 5 x 3.1 cm, previously 4 x 2.3 cm. Extension of mass towards the posterior gastric antrum, series 2, image 24, potential gastric involvement. Spleen: Normal in size without focal abnormality. Adrenals/Urinary Tract: Adrenal glands are unremarkable. Kidneys are normal, without renal calculi, focal lesion, or hydronephrosis. Bladder is  unremarkable. Stomach/Bowel: Moderate fluid distension of the stomach. Mild gastric antral wall thickening as seen on the  previous exam. No dilated small bowel. Large stool burden Vascular/Lymphatic: Nonaneurysmal aorta. No suspicious lymph nodes. 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 Reproductive: Uterus and bilateral adnexa are unremarkable. Other: Negative for pelvic effusion or free air. Musculoskeletal: No acute or suspicious osseous abnormality. IMPRESSION: 1. Slight interval increase in size of hypodense mass involving the pancreatic neck and body, now measuring up to 5 cm. Extension of mass towards the posterior gastric antrum with moderate fluid distension of the stomach and mild gastric antral wall thickening, potential gastric involvement by tumor 2. Large stool burden suggesting constipation. There is no evidence for a bowel obstruction. Electronically Signed   By: Donavan Foil M.D.   On: 03/20/2022 23:49       IMPRESSION/PLAN: 1. 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 Dr. Lisbeth Renshaw has reviewed her case and treatment to date. She is struggling with pain management and has previously not responded to celiac block. She is working with palliative and the hospitalist service to improve her pain. Constipation from narcotics also continues to be difficult to manage. Since she has progressed on therapy, Dr. Benay Spice has asked [place] to consider chemoradiation to her tumor. We discussed the risks, benefits, short, and long term effects of radiotherapy, as well as the dosing intent, and the patient is interested in proceeding. Dr. Lisbeth Renshaw discusses the delivery and logistics of radiotherapy and anticipates a course of 5 1/2 weeks of radiotherapy. She will simulate ***   In a visit lasting *** minutes, greater than 50% of the time was spent by phone and in floor time discussing the patient's condition, in preparation for the discussion, and coordinating the patient's care.      Carola Rhine,  [facility]   **Disclaimer: This note was dictated with voice recognition software. Similar sounding words can inadvertently be transcribed and this note may contain transcription errors which may not have been corrected upon publication of note.**

## 2022-04-20 DIAGNOSIS — F32A Depression, unspecified: Secondary | ICD-10-CM | POA: Diagnosis not present

## 2022-04-20 DIAGNOSIS — R52 Pain, unspecified: Secondary | ICD-10-CM | POA: Diagnosis not present

## 2022-04-20 MED ORDER — SODIUM CHLORIDE 0.9% FLUSH: 9.0000 mL | INTRAVENOUS | Status: DC | PRN

## 2022-04-20 MED ORDER — FENTANYL 50 MCG/ML IV PCA SOLN
INTRAVENOUS | Status: DC
  Administered 2022-04-21: 28.63 ug via INTRAVENOUS
  Administered 2022-04-21: 0.1 ug via INTRAVENOUS
  Filled 2022-04-20: qty 25

## 2022-04-20 MED ORDER — DIPHENHYDRAMINE HCL 12.5 MG/5ML PO ELIX: 12.5000 mg | ORAL_SOLUTION | Freq: Four times a day (QID) | ORAL | Status: DC | PRN

## 2022-04-20 MED ORDER — NALOXONE HCL 0.4 MG/ML IJ SOLN: 0.4000 mg | INTRAMUSCULAR | Status: DC | PRN

## 2022-04-20 MED ORDER — DIPHENHYDRAMINE HCL 50 MG/ML IJ SOLN: 12.5000 mg | Freq: Four times a day (QID) | INTRAMUSCULAR | Status: DC | PRN

## 2022-04-20 MED ORDER — ALUM & MAG HYDROXIDE-SIMETH 200-200-20 MG/5ML PO SUSP
30.0000 mL | ORAL | Status: DC | PRN
  Administered 2022-04-20: 30 mL via ORAL
  Filled 2022-04-20: qty 30

## 2022-04-20 MED ORDER — FENTANYL 50 MCG/ML IV PCA SOLN
INTRAVENOUS | Status: AC
  Administered 2022-04-20: 149.7 ug via INTRAVENOUS

## 2022-04-20 MED ORDER — FENTANYL 50 MCG/ML IV PCA SOLN
INTRAVENOUS | Status: AC
  Administered 2022-04-20: 50 ug via INTRAVENOUS

## 2022-04-20 MED ORDER — ONDANSETRON HCL 4 MG/2ML IJ SOLN: 4.0000 mg | Freq: Four times a day (QID) | INTRAMUSCULAR | Status: DC | PRN

## 2022-04-20 MED ORDER — FENTANYL 50 MCG/ML IV PCA SOLN: INTRAVENOUS | Status: DC

## 2022-04-20 MED ORDER — FENTANYL 25 MCG/HR TD PT72
1.0000 | MEDICATED_PATCH | TRANSDERMAL | Status: DC
  Administered 2022-04-20 – 2022-04-25 (×3): 1 via TRANSDERMAL
  Filled 2022-04-20 (×3): qty 1

## 2022-04-20 NOTE — Progress Notes (Cosign Needed Addendum)
Referring Physician(s): Douglass Rivers  Supervising Physician: Michaelle Birks  Patient Status:  [facility] - In-pt  Chief Complaint: Pancreatic cancer, persistent epigastric/back pain   Subjective: Pt familiar to our team from consultation with Dr. Maryelizabeth Kaufmann on 02/26/22 to discuss treatment options for persistent abdominal pain in setting of metastatic pancreatic cancer. She underwent celiac plexus block on 03/11/22 and did not have sig relief of her pain. She now presents with worsening abd pain despite medication/narcotic use.  Request now received for celiac plexus neurolysis.  Patient currently denies fever, headache, chest pain, dyspnea, cough, nausea, vomiting or bleeding.  She continues to have abdominal/back pain, constipation and is currently on fentanyl patch/PCA, dilaudid , decadron, neurontin.   Past Medical History:  Diagnosis Date   Abnormal Pap smear    Anxiety    "situational"   BV (bacterial vaginosis)    Cancer (HCC)    pancreatic   Cervical polyp    Depression    "situational"   Family history of adverse reaction to anesthesia    mother had N/V   Recurrent UTI (urinary tract infection)    Past Surgical History:  Procedure Laterality Date   BIOPSY  07/16/2021   Procedure: BIOPSY;  Surgeon: Rush Landmark, Telford Nab., MD;  Location: [facility] ENDOSCOPY;  Service: Gastroenterology;;   CERVICAL CONE BIOPSY  1996   ESOPHAGOGASTRODUODENOSCOPY (EGD) WITH PROPOFOL N/A 07/16/2021   Procedure: ESOPHAGOGASTRODUODENOSCOPY (EGD) WITH PROPOFOL;  Surgeon: Irving Copas., MD;  Location: [place];  Service: Gastroenterology;  Laterality: N/A;   FINE NEEDLE ASPIRATION  07/16/2021   Procedure: FINE NEEDLE ASPIRATION (FNA) LINEAR;  Surgeon: Irving Copas., MD;  Location: [place];  Service: Gastroenterology;;   IR RADIOLOGIST EVAL & MGMT  02/26/2022   LASIK     PORTACATH PLACEMENT Right 08/01/2021   Procedure: INSERTION PORT-A-CATH;  Surgeon: Dwan Bolt, MD;  Location: [place];  Service: General;  Laterality: Right;   UPPER ESOPHAGEAL ENDOSCOPIC ULTRASOUND (EUS) N/A 07/16/2021   Procedure: UPPER ESOPHAGEAL ENDOSCOPIC ULTRASOUND (EUS);  Surgeon: Irving Copas., MD;  Location: [place];  Service: Gastroenterology;  Laterality: N/A;      Allergies: Patient has no known allergies.  Medications: Prior to Admission medications   Medication Sig Start Date End Date Taking? Authorizing Provider  diclofenac Sodium (VOLTAREN) 1 % GEL Apply 2 g topically 4 (four) times daily as needed (back pain.). 09/05/21  Yes Ladell Pier, MD  famotidine (PEPCID) 40 MG tablet Take 40 mg by mouth 2 (two) times daily. 07/24/21  Yes [provider]  gabapentin (NEURONTIN) 300 MG capsule Take 1 capsule (300 mg total) by mouth 3 (three) times daily. 03/12/22  Yes Owens Shark, NP  HYDROmorphone (DILAUDID) 2 MG tablet Take 1-2 tablets (2-4 mg total) by mouth every 4 (four) hours as needed for severe pain. 03/22/22  Yes Owens Shark, NP  lidocaine-prilocaine (EMLA) cream Apply 1 Application topically as needed. 02/25/22  Yes Ladell Pier, MD  linaclotide (LINZESS) 290 MCG CAPS capsule Take 290 mcg by mouth daily before breakfast.   Yes [provider]  morphine (MS CONTIN) 30 MG 12 hr tablet Take 1 tablet (30 mg total) by mouth every 12 (twelve) hours. 03/29/22  Yes Owens Shark, NP  ondansetron (ZOFRAN) 8 MG tablet Take 1 tablet (8 mg total) by mouth every 8 (eight) hours as needed for nausea or vomiting (Starting day 4 after chemo as needed for nausea and vomiting). 12/28/21  Yes Ladell Pier, MD  pantoprazole (PROTONIX) 40 MG tablet Take 40 mg by mouth daily before breakfast. 07/11/21  Yes [provider]  predniSONE (DELTASONE) 10 MG tablet Take 2 tablets (20 mg total) by mouth daily with breakfast. 04/04/22 05/04/22 Yes Ladell Pier, MD  prochlorperazine (COMPAZINE) 5 MG tablet Take 1-2 tablets (5-10 mg total) by mouth every 6 (six) hours as  needed for nausea or vomiting. 09/21/21  Yes Owens Shark, NP  sertraline (ZOLOFT) 25 MG tablet Take 1 tablet (25 mg total) by mouth daily. 02/08/22  Yes Owens Shark, NP  dexamethasone (DECADRON) 4 MG tablet Take 1 tablet (4 mg total) by mouth 2 (two) times daily with a meal. Take twice daily x 2 days after each chemotherapy treatment beginning day 2 of each cycle Patient not taking: Reported on 02/25/2022 02/08/22   Owens Shark, NP  polyethylene glycol (MIRALAX / GLYCOLAX) 17 g packet Take 17 g by mouth 2 (two) times daily. Patient not taking: Reported on 04/15/2022 03/22/22   Raiford Noble Latif, DO  sorbitol 70 % solution Take 30 mLs by mouth daily as needed (Take 30 ml every 6 hours till bowel movement, then 30 ml daily). Patient not taking: Reported on 04/15/2022 04/01/22   Ladell Pier, MD     Vital Signs: BP 108/73 (BP Location: Right Arm)   Pulse 77   Temp 99.1 F (37.3 C) (Oral)   Resp 18   Ht '5\' 10"'$  (1.778 m)   Wt 101 lb 13.6 oz (46.2 kg)   LMP 11/01/2018   SpO2 98%   BMI 14.61 kg/m   Physical Exam awake, alert. Thin cachetic white female.  Chest clear to auscultation bilaterally.  Heart with regular rate and rhythm.  Abdomen soft, positive bowel sounds, nondistended, currently denies worsening abdominal pain; reports back pain; no lower extremity edema  Imaging: No results found.  Labs:  CBC: Recent Labs    04/15/22 0905 04/15/22 0943 04/16/22 0515 04/16/22 1102  WBC 3.2* 3.2* 5.1 5.1  HGB 6.2* 6.1* 12.4 12.4  HCT 19.1* 19.4* 38.9 38.9  PLT 76* 76* 147* 142*    COAGS: Recent Labs    03/11/22 0854 04/16/22 0515  INR 1.0 1.0    BMP: Recent Labs    12/27/21 0925 12/27/21 1008 04/15/22 1008 04/16/22 0515 04/16/22 1102 04/19/22 1330  NA QUESTIONABLE RESULTS, RECOMMEND RECOLLECT TO VERIFY   < > 137 137 133* 133*  K QUESTIONABLE RESULTS, RECOMMEND RECOLLECT TO VERIFY   < > 3.7 4.1 3.9 3.4*  CL QUESTIONABLE RESULTS, RECOMMEND RECOLLECT TO VERIFY    < > 100 100 102 98  CO2 QUESTIONABLE RESULTS, RECOMMEND RECOLLECT TO VERIFY   < > '30 29 28 29  '$ GLUCOSE QUESTIONABLE RESULTS, RECOMMEND RECOLLECT TO VERIFY   < > 121* 104* 138* 165*  BUN QUESTIONABLE RESULTS, RECOMMEND RECOLLECT TO VERIFY   < > '14 13 15 '$ 21*  CALCIUM QUESTIONABLE RESULTS, RECOMMEND RECOLLECT TO VERIFY   < > 9.5 9.3 8.8* 8.7*  CREATININE QUESTIONABLE RESULTS, RECOMMEND RECOLLECT TO VERIFY   < > 0.43* 0.48 0.43* 0.36*  GFRNONAA QUESTIONABLE RESULTS, RECOMMEND RECOLLECT TO VERIFY   < > >60 >60 >60 >60  GFRAA QUESTIONABLE RESULTS, RECOMMEND RECOLLECT TO VERIFY  --   --   --   --   --    < > = values in this interval not displayed.    LIVER FUNCTION TESTS: Recent Labs    04/15/22 1008 04/16/22 0515 04/16/22 1102 04/19/22 1330  BILITOT  0.3 0.4 0.5 0.4  AST 11* 14* 15 12*  ALT '11 15 14 13  '$ ALKPHOS 49 64 64 66  PROT 7.0 7.3 7.2 6.8  ALBUMIN 3.9 3.8 3.8 3.6    Assessment and Plan: Pt familiar to our team from consultation with Dr. Maryelizabeth Kaufmann on 02/26/22 to discuss treatment options for persistent abdominal pain in setting of metastatic pancreatic cancer. She underwent celiac plexus block on 03/11/22 and did not have sig relief of her pain. She now presents with worsening abd pain despite medication/narcotic use.  Request now received for celiac plexus neurolysis.  Imaging/case has been reviewed by Dr. Maryelizabeth Kaufmann.  Details/risks of procedure, including but not limited to, internal bleeding, infection, injury to adjacent structures, inability to eradicate abdominal/back pain, diarrhea discussed with patient with her understanding and consent.  Procedure scheduled for 3/11 with Dr. Maryelizabeth Kaufmann.   Hold lovenox starting on 3/10 until after above procedure  Electronically Signed: D. Rowe Robert, PA-C 04/20/2022, 1:59 PM   I spent a total of 25 minutes at the the patient's bedside AND on the patient's hospital floor or unit, greater than 50% of which was counseling/coordinating care for CT  guided celiac plexus neurolysis    Patient ID: Tara Jensen, female   DOB: 1966-01-06, 57 y.o.   MRN: [id number]

## 2022-04-20 NOTE — Progress Notes (Cosign Needed Addendum)
Palliative Care Progress Note:  Updated bedside nursing staff and pharmacy regarding schedule for patient's transition from Fentanyl infusion basal dosing to Fentanyl TD patch. Orders in place for timed adjustments over the next 24 hours.   Fentanyl boluses will continue throughout transition for break-through pain. BTP medication needs will be assessed tomorrow with plan to transition Fentanyl boluses to oral medication.  Thank you for allowing the Palliative Medicine Team to assist in the care of this patient.   Signed by: Moss Mc, RN MSN [facility] / NP Student   If patient remains symptomatic despite maximum doses, please call PMT at [phone] between 0700 and 1900. Outside of these hours, please call attending, as PMT does not have night coverage.

## 2022-04-20 NOTE — Progress Notes (Signed)
Daily Progress Note   Patient Name: Tara Jensen       Date: 04/20/2022 DOB: 02-07-1966  Age: 57 y.o. MRN#: [id number] Attending Physician: Edwin Dada, * Primary Care Physician: Chesley Noon, MD Admit Date: 04/15/2022  Reason for Consultation/Follow-up: Establishing goals of care, Non pain symptom management, Pain control, and Psychosocial/spiritual support  Subjective: Patient is awake alert resting in bed.  She states that she has had significant improvement in her pain control.  She feels well rested.  She states that the fentanyl PCA has worked really well for her.  Her friend Rockney Ghee is also present at bedside.  Today, we discussed about pain management options and ongoing support at [facility]-palliative care.  See below.  Length of Stay: 4  Current Medications: Scheduled Meds:   sodium chloride   Intravenous Once   celecoxib  200 mg Oral BID   Chlorhexidine Gluconate Cloth  6 each Topical Daily   dexamethasone (DECADRON) injection  8 mg Intravenous Q24H   DULoxetine  20 mg Oral Daily   enoxaparin (LOVENOX) injection  40 mg Subcutaneous Q24H   famotidine  40 mg Oral BID   feeding supplement  237 mL Oral BID BM   fentaNYL  1 patch Transdermal Q72H   fentaNYL   Intravenous Q4H   gabapentin  400 mg Oral TID   modafinil  100 mg Oral Q breakfast   multivitamin with minerals  1 tablet Oral Daily   pantoprazole  40 mg Oral 2 times per day   traZODone  50 mg Oral QHS    Continuous Infusions:  promethazine (PHENERGAN) injection (IM or IVPB) Stopped (04/18/22 0229)    PRN Meds: HYDROmorphone (DILAUDID) injection, lidocaine-prilocaine, LORazepam, methylnaltrexone, naloxone **AND** sodium chloride flush, mouth rinse, promethazine (PHENERGAN)  injection (IM or IVPB), simethicone  Physical Exam         Awake alert oriented Sitting up in bed No acute distress Lungs are clear to auscultation Abdomen is not distended Does not have edema No focal deficits  Vital Signs: BP 114/69 (BP Location: Right Arm)   Pulse 70   Temp 98.1 F (36.7 C) (Oral)   Resp 16   Ht '5\' 10"'$  (1.778 m)   Wt 46.2 kg   LMP 11/01/2018   SpO2 98%   BMI 14.61  kg/m  SpO2: SpO2: 98 % O2 Device: O2 Device: Room Air O2 Flow Rate: O2 Flow Rate (L/min): 0 L/min  Intake/output summary:  Intake/Output Summary (Last 24 hours) at 04/20/2022 1156 Last data filed at 04/20/2022 0300 Gross per 24 hour  Intake 12.4 ml  Output --  Net 12.4 ml   LBM: Last BM Date : 04/19/22 Baseline Weight: Weight: 46 kg Most recent weight: Weight: 46.2 kg       Palliative Assessment/Data:      Patient Active Problem List   Diagnosis Date Noted   Intractable pain 04/16/2022   Pancytopenia ([place]) 04/15/2022   Therapeutic opioid-induced constipation (OIC) 04/15/2022   Depression 04/15/2022   Abdominal pain 03/21/2022   Protein-calorie malnutrition, severe 03/21/2022   Genetic testing 09/04/2021   Cancer of pancreas, body ([place]) 07/24/2021   Well woman exam with routine gynecological exam 08/09/2015   Rectocele 08/09/2015    Palliative Care Assessment & Plan   Patient Profile:    Assessment: Metastatic pancreatic cancer.  Recommendations/Plan: Patient is being followed by palliative care for cancer-related pain and for pain and known pain symptom management in general. Patient was opioid rotated to fentanyl PCA and states that this has been tremendously helpful.  She will was also placed on Celebrex, Decadron, Cymbalta and Zoloft was discontinued.  Other adjuvants are gabapentin and she is being considered for celiac plexus neurolysis planned as an interventional option for her pain management scheduled for next week.   On 04-20-2022: Palliative care followed up  with the patient with regards to her pain management.  Fentanyl PCA needs noted.  Patient is on a 30 mcg basal rate on her fentanyl PCA.  She is to be started on the closest transdermal fentanyl patches strength-25 mcg, 6 hours later, continuous infusion will be decreased by 50% new basal rate of 15 mcg, 6 hours after that-12 hours after transdermal fentanyl patch has been started, basal infusion rate will be discontinued on the PCA and demand bolus dosage Fentanyl PCA will be continued for the next 24 hours.    Discussed with patient about concept of " personalized pain score" -reasonable level of pain that she can tolerate that will not interfere with activities that are most important to her.  Patient was on MS Contin before for long-acting pain management and states that this was not helpful.  Patient seen and examined.  She does have significant cachexia and we will simply have to find the right spot for the transdermal fentanyl patch for her.  Certainly try to see if the transdermal fentanyl patch will work as a good option for long-acting pain control for her.    Within 24 hours, on 04-21-2022, the plan is to discontinue demand bolus dosage fentanyl PCA and start oral hydromorphone concentrated solution to be available every 3-4 hours on an as-needed basis for short acting opioids/breakthrough pain medication.  Patient is requesting follow-up with palliative care at [place].  Current bowel regimen is working well.  Continue to monitor. Palliative medicine team will continue to follow along for adjusting pain and known pain symptom management as well as for continuing broad goals of care discussions.  Code Status:    Code Status Orders  (From admission, onward)           Start     Ordered   04/19/22 1229  Do not attempt resuscitation (DNR)  Continuous       Question Answer Comment  If patient has no pulse and  is not breathing Do Not Attempt Resuscitation   If patient  has a pulse and/or is breathing: Medical Treatment Goals LIMITED ADDITIONAL INTERVENTIONS: Use medication/IV fluids and cardiac monitoring as indicated; Do not use intubation or mechanical ventilation (DNI), also provide comfort medications.  Transfer to Progressive/Stepdown as indicated, avoid Intensive Care.   Consent: Discussion documented in EHR or advanced directives reviewed      04/19/22 1228           Code Status History     Date Active Date Inactive Code Status Order ID Comments User Context   03/21/2022 0445 03/22/2022 1858 Full Code [id number]  [name] DO Inpatient   03/11/2022 1258 03/12/2022 0513 Full Code [id number]  Michaelle Birks, MD [facility]      Advance Directive Documentation    Flowsheet Row Most Recent Value  Type of Advance Directive Healthcare Power of Attorney  Pre-existing out of facility DNR order (yellow form or pink MOST form) --  "MOST" Form in Place? --       Prognosis:  Unable to determine  Discharge Planning: To Be Determined  Care plan was discussed with patient, friend present at bedside, to [place].  Thank you for allowing the Palliative Medicine Team to assist in the care of this patient.  High MDM     Greater than 50%  of this time was spent counseling and coordinating care related to the above assessment and plan.  Loistine Chance, MD  Please contact Palliative Medicine Team phone at [phone] for questions and concerns.

## 2022-04-20 NOTE — Progress Notes (Signed)
  Progress Note   Patient: Tara Jensen [id number] DOB: [birth year] DOA: 04/15/2022     4 DOS: the patient was seen and examined on 04/20/2022        Brief hospital course: Tara Jensen is a 57 y.o. F with hx pancreatic CA locally advanced who presented with worsening abdominal pain, N/V.        Assessment and Plan: Mood - Continue Cymbalta, trazodone, Ativan   Intractable pain Cancer of pancreas, body (HCC) - Per Palliative          Subjective: No new concerns     Physical Exam: BP 108/73 (BP Location: Right Arm)   Pulse 77   Temp 99.1 F (37.3 C) (Oral)   Resp 18   Ht 5\' 10"  (1.778 m)   Wt 46.2 kg   LMP 11/01/2018   SpO2 97%   BMI 14.61 kg/m   Attentive, alert, interactive, thin RRR no murmurs, no LE edema Face symmetric, speech fluent, oriented to self and situation     Data Reviewed: None new      Disposition: Status is: Inpatient         Author: Edwin Dada, MD 04/20/2022 4:26 PM  For on call review [email or web address].

## 2022-04-21 DIAGNOSIS — R52 Pain, unspecified: Secondary | ICD-10-CM | POA: Diagnosis not present

## 2022-04-21 MED ORDER — LIDOCAINE 5 % EX PTCH
1.0000 | MEDICATED_PATCH | Freq: Every day | CUTANEOUS | Status: DC
  Administered 2022-04-21 – 2022-04-25 (×5): 1 via TRANSDERMAL
  Filled 2022-04-21 (×5): qty 1

## 2022-04-21 MED ORDER — FENTANYL 50 MCG/ML IV PCA SOLN
INTRAVENOUS | Status: DC
  Administered 2022-04-21 – 2022-04-22 (×3): 30 ug via INTRAVENOUS
  Administered 2022-04-23: 30 ug/h via INTRAVENOUS

## 2022-04-21 MED ORDER — ENOXAPARIN SODIUM 40 MG/0.4ML IJ SOSY
40.0000 mg | PREFILLED_SYRINGE | INTRAMUSCULAR | Status: DC
  Administered 2022-04-22 – 2022-04-24 (×3): 40 mg via SUBCUTANEOUS
  Filled 2022-04-21 (×3): qty 0.4

## 2022-04-21 NOTE — Progress Notes (Signed)
  Progress Note   Patient: Tara Jensen [id number] DOB: 04-23-1965 DOA: 04/15/2022     5 DOS: the patient was seen and examined on 04/21/2022        Brief hospital course: Tara Jensen is a 57 y.o. F with hx locally advanced pancreatic CA who presented with worsening abdominal pain, N/V.        Assessment and Plan: Cancer of pancreas, body (HCC) Intractable pain  Depression Therapeutic opioid-induced constipation (OIC) Pancytopenia (HCC) Protein-calorie malnutrition, severe - Defer PCA adjustments to Palliative Care - Continue duloxetine, trazodone, Ativan          Subjective: Pain worse today     Physical Exam: BP 106/61 (BP Location: Left Arm)   Pulse 75   Temp 98.2 F (36.8 C) (Oral)   Resp 16   Ht 5\' 10"  (1.778 m)   Wt 46.2 kg   LMP 11/01/2018   SpO2 98%   BMI 14.61 kg/m   Thin adult female, lying in bed, appears more uncomfortable today.    Disposition: Status is: Inpatient         Author: Edwin Dada, MD 04/21/2022 4:22 PM  For on call review [email or web address].

## 2022-04-21 NOTE — Progress Notes (Signed)
Daily Progress Note   Patient Name: Tara Jensen       Date: 04/21/2022 DOB: 1965/07/25  Age: 57 y.o. MRN#: [id number] Attending Physician: Edwin Dada, * Primary Care Physician: Chesley Noon, MD Admit Date: 04/15/2022  Reason for Consultation/Follow-up: Establishing goals of care, Non pain symptom management, Pain control, and Psychosocial/spiritual support  Subjective: Patient is awake alert resting in bed.  She rested well overnight, tolerated the change from basal PCA Fentanyl to 25 mcg TDF well, remains on bolus dose Fentanyl PCA, has used 20 mcg bolus fentanyl 4 times since the past 5 hours or so. Had sharp pain in back and abdomen while walking in hallway just prior to my visit. Friend present at bedside. Celiac plexus block to be done in am on 04-22-22. Fentanyl PCA interrogated, appreciate RN Jennifer's assistance.    Today, we discussed about pain management options and ongoing support at [facility]-palliative care.  See below.  Length of Stay: 5  Current Medications: Scheduled Meds:   sodium chloride   Intravenous Once   celecoxib  200 mg Oral BID   Chlorhexidine Gluconate Cloth  6 each Topical Daily   dexamethasone (DECADRON) injection  8 mg Intravenous Q24H   DULoxetine  20 mg Oral Daily   [START ON 04/22/2022] enoxaparin (LOVENOX) injection  40 mg Subcutaneous Q24H   famotidine  40 mg Oral BID   feeding supplement  237 mL Oral BID BM   fentaNYL  1 patch Transdermal Q72H   fentaNYL   Intravenous Q4H   gabapentin  400 mg Oral TID   modafinil  100 mg Oral Q breakfast   multivitamin with minerals  1 tablet Oral Daily   pantoprazole  40 mg Oral 2 times per day   traZODone  50 mg Oral QHS    Continuous Infusions:  promethazine (PHENERGAN)  injection (IM or IVPB) Stopped (04/18/22 0229)    PRN Meds: alum & mag hydroxide-simeth, diphenhydrAMINE **OR** diphenhydrAMINE, HYDROmorphone (DILAUDID) injection, lidocaine-prilocaine, LORazepam, methylnaltrexone, naloxone **AND** sodium chloride flush, ondansetron (ZOFRAN) IV, mouth rinse, promethazine (PHENERGAN) injection (IM or IVPB), simethicone  Physical Exam         Awake alert oriented Sitting up in bed No acute distress No increased effort at breathing Abdomen is not distended Does not have edema No focal deficits  Vital Signs: BP 106/65 (BP Location: Left Arm)   Pulse 75   Temp 98.8 F (37.1 C) (Oral)   Resp 16   Ht '5\' 10"'$  (1.778 m)   Wt 46.2 kg   LMP 11/01/2018   SpO2 94%   BMI 14.61 kg/m  SpO2: SpO2: 94 % O2 Device: O2 Device: Room Air O2 Flow Rate: O2 Flow Rate (L/min): 0 L/min  Intake/output summary: No intake or output data in the 24 hours ending 04/21/22 1150  LBM: Last BM Date : 04/19/22 Baseline Weight: Weight: 46 kg Most recent weight: Weight: 46.2 kg       Palliative Assessment/Data:      Patient Active Problem List   Diagnosis Date Noted   Intractable pain 04/16/2022   Pancytopenia ([place]) 04/15/2022   Therapeutic opioid-induced constipation (OIC) 04/15/2022   Depression 04/15/2022   Abdominal pain 03/21/2022   Protein-calorie malnutrition, severe 03/21/2022   Genetic testing 09/04/2021   Cancer of pancreas, body ([place]) 07/24/2021   Well woman exam with routine gynecological exam 08/09/2015   Rectocele 08/09/2015    Palliative Care Assessment & Plan   Patient Profile:    Assessment: Metastatic pancreatic cancer.  Recommendations/Plan: Patient is being followed by palliative care for cancer-related pain and for pain and known pain symptom management in general. Patient was opioid rotated to fentanyl PCA and states that this has been tremendously helpful.  She will was also placed on Celebrex, Decadron, Cymbalta and Zoloft was  discontinued.  Other adjuvants are gabapentin and she is being considered for celiac plexus neurolysis planned as an interventional option for her pain management scheduled for next week.   On 04-20-2022: Palliative care followed up with the patient with regards to her pain management, basal Fentanyl PCA was converted to TDF 25 mcg, remains on 20 mcg Q 15 min PRN bolus Fentanyl PCA , pump interrogated and needs noted.     Patient is requesting for repeat CA 19-9 level, discussed about trends in her CA 19-9 levels and that the last level was done close to her prior celiac plexus block.   Patient is requesting follow-up with palliative care at [place].  Current bowel regimen is working well.  Continue to monitor. Palliative medicine team will continue to follow along for adjusting pain and known pain symptom management as well as for continuing broad goals of care discussions.  Code Status:    Code Status Orders  (From admission, onward)           Start     Ordered   04/19/22 1229  Do not attempt resuscitation (DNR)  Continuous       Question Answer Comment  If patient has no pulse and is not breathing Do Not Attempt Resuscitation   If patient has a pulse and/or is breathing: Medical Treatment Goals LIMITED ADDITIONAL INTERVENTIONS: Use medication/IV fluids and cardiac monitoring as indicated; Do not use intubation or mechanical ventilation (DNI), also provide comfort medications.  Transfer to Progressive/Stepdown as indicated, avoid Intensive Care.   Consent: Discussion documented in EHR or advanced directives reviewed      04/19/22 1228           Code Status History     Date Active Date Inactive Code Status Order ID Comments User Context   03/21/2022 0445 03/22/2022 1858 Full Code [id number]  [name], DO Inpatient   03/11/2022 1258 03/12/2022 0513 Full Code [id number]  Michaelle Birks, MD [facility]      Advance Directive  Documentation    Flowsheet Row Most Recent  Value  Type of Advance Directive Healthcare Power of Attorney  Pre-existing out of facility DNR order (yellow form or pink MOST form) --  "MOST" Form in Place? --       Prognosis:  Unable to determine  Discharge Planning: To Be Determined  Care plan was discussed with patient, friend present at bedside, Dr Hilma Favors.  Thank you for allowing the Palliative Medicine Team to assist in the care of this patient.  High MDM     Greater than 50%  of this time was spent counseling and coordinating care related to the above assessment and plan.  Loistine Chance, MD  Please contact Palliative Medicine Team phone at [phone] for questions and concerns.

## 2022-04-22 ENCOUNTER — Inpatient Hospital Stay (HOSPITAL_COMMUNITY): Payer: BC Managed Care – PPO

## 2022-04-22 LAB — CBC WITH DIFFERENTIAL/PLATELET
Abs Immature Granulocytes: 0.03 10*3/uL (ref 0.00–0.07)
Basophils Absolute: 0 10*3/uL (ref 0.0–0.1)
Basophils Relative: 0 %
Eosinophils Absolute: 0.1 10*3/uL (ref 0.0–0.5)
Eosinophils Relative: 1 %
HCT: 38.2 % (ref 36.0–46.0)
Hemoglobin: 12 g/dL (ref 12.0–15.0)
Immature Granulocytes: 0 %
Lymphocytes Relative: 31 %
Lymphs Abs: 2.9 10*3/uL (ref 0.7–4.0)
MCH: 31.8 pg (ref 26.0–34.0)
MCHC: 31.4 g/dL (ref 30.0–36.0)
MCV: 101.3 fL — ABNORMAL HIGH (ref 80.0–100.0)
Monocytes Absolute: 0.7 10*3/uL (ref 0.1–1.0)
Monocytes Relative: 7 %
Neutro Abs: 5.7 10*3/uL (ref 1.7–7.7)
Neutrophils Relative %: 61 %
Platelets: 208 10*3/uL (ref 150–400)
RBC: 3.77 MIL/uL — ABNORMAL LOW (ref 3.87–5.11)
RDW: 13.9 % (ref 11.5–15.5)
WBC: 9.4 10*3/uL (ref 4.0–10.5)
nRBC: 0 % (ref 0.0–0.2)

## 2022-04-22 MED ORDER — GABAPENTIN 300 MG PO CAPS
600.0000 mg | ORAL_CAPSULE | Freq: Three times a day (TID) | ORAL | Status: DC
  Administered 2022-04-22 – 2022-04-23 (×2): 600 mg via ORAL
  Filled 2022-04-22 (×2): qty 2

## 2022-04-22 MED ORDER — IOHEXOL 300 MG/ML  SOLN
30.0000 mL | Freq: Once | INTRAMUSCULAR | Status: AC | PRN
  Administered 2022-04-22: 1 mL

## 2022-04-22 MED ORDER — FENTANYL CITRATE (PF) 100 MCG/2ML IJ SOLN
INTRAMUSCULAR | Status: AC
  Filled 2022-04-22: qty 4

## 2022-04-22 MED ORDER — BUPIVACAINE HCL (PF) 0.5 % IJ SOLN
INTRAMUSCULAR | Status: AC
  Administered 2022-04-22: 30 mL via PERINEURAL
  Filled 2022-04-22: qty 30

## 2022-04-22 MED ORDER — MIDAZOLAM HCL 2 MG/2ML IJ SOLN
INTRAMUSCULAR | Status: AC | PRN
  Administered 2022-04-22 (×4): 1 mg via INTRAVENOUS

## 2022-04-22 MED ORDER — FENTANYL CITRATE (PF) 100 MCG/2ML IJ SOLN
INTRAMUSCULAR | Status: AC | PRN
  Administered 2022-04-22 (×2): 50 ug via INTRAVENOUS

## 2022-04-22 MED ORDER — FENTANYL 12 MCG/HR TD PT72
1.0000 | MEDICATED_PATCH | TRANSDERMAL | Status: DC
  Administered 2022-04-22 – 2022-04-25 (×2): 1 via TRANSDERMAL
  Filled 2022-04-22 (×2): qty 1

## 2022-04-22 MED ORDER — MIDAZOLAM HCL 2 MG/2ML IJ SOLN
INTRAMUSCULAR | Status: AC
  Filled 2022-04-22: qty 4

## 2022-04-22 MED ORDER — ALCOHOL (ABLYSINOL) 99% IA SOLN
INTRA_ARTERIAL | Status: AC
  Administered 2022-04-22: 30 mL via PERINEURAL
  Filled 2022-04-22: qty 5

## 2022-04-22 NOTE — Progress Notes (Signed)
   04/22/22 1430  Vitals  Temp 99.1 F (37.3 C)  Temp Source Oral  BP (!) 91/51  MAP (mmHg) (!) 64  BP Method Automatic  Pulse Rate (!) 108  Resp 20  MEWS COLOR  MEWS Score Color Yellow  Oxygen Therapy  SpO2 92 %  POSS Scale (Pasero Opioid Sedation Scale)  POSS *See Group Information* 1-Acceptable,Awake and alert  PCA/Epidural/Spinal Assessment  Respiratory Pattern Regular;Unlabored  MEWS Score  MEWS Temp 0  MEWS Systolic 1  MEWS Pulse 1  MEWS RR 0  MEWS LOC 0  MEWS Score 2  Note  Observations Pt normally runs a low BP, Md not contacted

## 2022-04-22 NOTE — Progress Notes (Signed)
  Progress Note   Patient: Tara Jensen [id number] DOB: 07-28-1965 DOA: 04/15/2022     6 DOS: the patient was seen and examined on 04/22/2022        Brief hospital course: Tara Jensen is a 57 y.o. F with hx locally advanced pancreatic CA who presented with worsening abdominal pain, N/V.        Assessment and Plan: Cancer of pancreas, body (HCC) Intractable pain  Depression Therapeutic opioid-induced constipation (OIC) Pancytopenia (HCC) Protein-calorie malnutrition, severe - Defer PCA adjustments to Palliative Care - Continue duloxetine, trazodone, Ativan          Subjective: Pain worse today     Physical Exam: BP (!) 101/51   Pulse (!) 108   Temp 98.4 F (36.9 C) (Oral)   Resp 20   Ht 5\' 10"  (1.778 m)   Wt 46.2 kg   LMP 11/01/2018   SpO2 98%   BMI 14.61 kg/m   Adult female, lying in bed, interactive and appropriate Tachycardic, regular, no murmurs Mentation appears normal, generally weak but symmetric, speech fluent.    Disposition: Status is: Inpatient         Author: Edwin Dada, MD 04/22/2022 2:25 PM  For on call review [email or web address].

## 2022-04-22 NOTE — Procedures (Addendum)
Vascular and Interventional Radiology Procedure Note  Patient: Tara Jensen DOB: 10/27/1965 Medical Record Number: [id number] Note Date/Time: 04/22/22 9:43 AM   Performing Physician: Michaelle Birks, MD Assistant(s): None   Diagnosis: Pancreatic mass with recurrent intractable abdominal pain. Previous CPB on 03/11/22   Procedure: CELIAC PLEXUS NEUROLYSIS   Anesthesia: Conscious Sedation Complications: None Estimated Blood Loss:  0 mL Specimens: Sent for None   Findings:  Successful CT-guided celiac plexus nerve neurolysis, by a bilateral posterior approach. 0.5% Bupivacaine and 99% denatured EtOH was administed Hemostasis of the tract was achieved using Manual Pressure.   Plan: Bed rest for 2 hours.  See detailed procedure note with images in PACS. The patient tolerated the procedure well without incident or complication and was returned to Recovery in stable condition.    Michaelle Birks, MD Vascular and Interventional Radiology Specialists [facility] Radiology   Pager. [place]

## 2022-04-22 NOTE — Progress Notes (Addendum)
Patient ID: Tara Jensen, female   DOB: May 10, 1965, 57 y.o.   MRN: [id number] Patient continues to have some abdominal pain but has not used PCA pump since neurolysis procedure earlier today.  Fatigued.  BP 91/51, temp 99.1, she had some orthostasis while going to the restroom, which is not unusual post celiac neurolysis.  No fever, chills or respiratory issues.  No nausea or vomiting.  Puncture sites mid back region clean and dry, no hematoma, mildly tender to palpation.  Hydrate, closely monitor vitals, check f/u labs.  Avoid ambulation until tomorrow if possible; if ambulation necessary overnight please use assistance; recheck status on 3/12.  Dr. Maryelizabeth Kaufmann updated.

## 2022-04-23 DIAGNOSIS — F32A Depression, unspecified: Secondary | ICD-10-CM | POA: Diagnosis not present

## 2022-04-23 DIAGNOSIS — R52 Pain, unspecified: Secondary | ICD-10-CM | POA: Diagnosis not present

## 2022-04-23 LAB — CBC WITH DIFFERENTIAL/PLATELET
Abs Immature Granulocytes: 0.01 10*3/uL (ref 0.00–0.07)
Basophils Absolute: 0 10*3/uL (ref 0.0–0.1)
Basophils Relative: 0 %
Eosinophils Absolute: 0 10*3/uL (ref 0.0–0.5)
Eosinophils Relative: 0 %
HCT: 33.9 % — ABNORMAL LOW (ref 36.0–46.0)
Hemoglobin: 10.7 g/dL — ABNORMAL LOW (ref 12.0–15.0)
Immature Granulocytes: 0 %
Lymphocytes Relative: 19 %
Lymphs Abs: 1 10*3/uL (ref 0.7–4.0)
MCH: 31.6 pg (ref 26.0–34.0)
MCHC: 31.6 g/dL (ref 30.0–36.0)
MCV: 100 fL (ref 80.0–100.0)
Monocytes Absolute: 0.4 10*3/uL (ref 0.1–1.0)
Monocytes Relative: 8 %
Neutro Abs: 3.9 10*3/uL (ref 1.7–7.7)
Neutrophils Relative %: 73 %
Platelets: 147 10*3/uL — ABNORMAL LOW (ref 150–400)
RBC: 3.39 MIL/uL — ABNORMAL LOW (ref 3.87–5.11)
RDW: 14.1 % (ref 11.5–15.5)
WBC: 5.3 10*3/uL (ref 4.0–10.5)
nRBC: 0 % (ref 0.0–0.2)

## 2022-04-23 MED ORDER — GABAPENTIN 300 MG PO CAPS
300.0000 mg | ORAL_CAPSULE | Freq: Three times a day (TID) | ORAL | Status: DC
  Administered 2022-04-23 – 2022-04-25 (×6): 300 mg via ORAL
  Filled 2022-04-23 (×6): qty 1

## 2022-04-23 MED ORDER — BOOST / RESOURCE BREEZE PO LIQD CUSTOM
1.0000 | Freq: Three times a day (TID) | ORAL | Status: DC
  Administered 2022-04-23 – 2022-04-24 (×2): 1 via ORAL

## 2022-04-23 MED ORDER — DULOXETINE HCL 30 MG PO CPEP
30.0000 mg | ORAL_CAPSULE | Freq: Every day | ORAL | Status: DC
  Administered 2022-04-24 – 2022-04-25 (×2): 30 mg via ORAL
  Filled 2022-04-23 (×2): qty 1

## 2022-04-23 NOTE — Plan of Care (Signed)
  Problem: Clinical Measurements: Goal: Ability to maintain clinical measurements within normal limits will improve Outcome: Progressing Goal: Will remain free from infection Outcome: Progressing   Problem: Education: Goal: Knowledge of General Education information will improve Description: Including pain rating scale, medication(s)/side effects and non-pharmacologic comfort measures Outcome: Adequate for Discharge   Problem: Health Behavior/Discharge Planning: Goal: Ability to manage health-related needs will improve Outcome: Adequate for Discharge   Problem: Clinical Measurements: Goal: Diagnostic test results will improve Outcome: Adequate for Discharge Goal: Respiratory complications will improve Outcome: Adequate for Discharge Goal: Cardiovascular complication will be avoided Outcome: Adequate for Discharge   Problem: Activity: Goal: Risk for activity intolerance will decrease Outcome: Adequate for Discharge   Problem: Nutrition: Goal: Adequate nutrition will be maintained Outcome: Adequate for Discharge   Problem: Coping: Goal: Level of anxiety will decrease Outcome: Adequate for Discharge   Problem: Elimination: Goal: Will not experience complications related to bowel motility Outcome: Adequate for Discharge Goal: Will not experience complications related to urinary retention Outcome: Adequate for Discharge   Problem: Pain Managment: Goal: General experience of comfort will improve Outcome: Adequate for Discharge   Problem: Safety: Goal: Ability to remain free from injury will improve Outcome: Adequate for Discharge   Problem: Skin Integrity: Goal: Risk for impaired skin integrity will decrease Outcome: Adequate for Discharge   

## 2022-04-23 NOTE — Progress Notes (Signed)
I left a voicemail for the pt to check in. It sounds like her celiac block was helpful over the last 12 plus hours. We are happy to revisit discussion on radiation if she desires, but will hold off on plans for treatment unless the patient desires this.     Carola Rhine, PAC

## 2022-04-23 NOTE — Progress Notes (Signed)
Referring Physician(s): [place]  Supervising Physician: Aletta Edouard  Patient Status:  [facility] - In-pt  Chief Complaint:  Pancreatic cancer, persistent epigastric/back pain S/p  celiac plexus neurolysis by Dr. Maryelizabeth Kaufmann on 3/11   Subjective:  Patient laying in bed, visitor at bedside.  States that last night was rough, she required Dilaudid yesterday but she is doing better this morning.  Cannot really tell if the procedure worked since she is in IV pain meds and also patches (on Fentanyl and lidocaine path.) Denies N/V, still has pain in right side of the abdomen which wraps around to right back.   Allergies: Patient has no known allergies.  Medications: Prior to Admission medications   Medication Sig Start Date End Date Taking? Authorizing Provider  diclofenac Sodium (VOLTAREN) 1 % GEL Apply 2 g topically 4 (four) times daily as needed (back pain.). 09/05/21  Yes Ladell Pier, MD  famotidine (PEPCID) 40 MG tablet Take 40 mg by mouth 2 (two) times daily. 07/24/21  Yes [provider]  gabapentin (NEURONTIN) 300 MG capsule Take 1 capsule (300 mg total) by mouth 3 (three) times daily. 03/12/22  Yes Owens Shark, NP  HYDROmorphone (DILAUDID) 2 MG tablet Take 1-2 tablets (2-4 mg total) by mouth every 4 (four) hours as needed for severe pain. 03/22/22  Yes Owens Shark, NP  lidocaine-prilocaine (EMLA) cream Apply 1 Application topically as needed. 02/25/22  Yes Ladell Pier, MD  linaclotide (LINZESS) 290 MCG CAPS capsule Take 290 mcg by mouth daily before breakfast.   Yes [provider]  morphine (MS CONTIN) 30 MG 12 hr tablet Take 1 tablet (30 mg total) by mouth every 12 (twelve) hours. 03/29/22  Yes Owens Shark, NP  ondansetron (ZOFRAN) 8 MG tablet Take 1 tablet (8 mg total) by mouth every 8 (eight) hours as needed for nausea or vomiting (Starting day 4 after chemo as needed for nausea and vomiting). 12/28/21  Yes Ladell Pier, MD  pantoprazole  (PROTONIX) 40 MG tablet Take 40 mg by mouth daily before breakfast. 07/11/21  Yes [provider]  predniSONE (DELTASONE) 10 MG tablet Take 2 tablets (20 mg total) by mouth daily with breakfast. 04/04/22 05/04/22 Yes Ladell Pier, MD  prochlorperazine (COMPAZINE) 5 MG tablet Take 1-2 tablets (5-10 mg total) by mouth every 6 (six) hours as needed for nausea or vomiting. 09/21/21  Yes Owens Shark, NP  sertraline (ZOLOFT) 25 MG tablet Take 1 tablet (25 mg total) by mouth daily. 02/08/22  Yes Owens Shark, NP  dexamethasone (DECADRON) 4 MG tablet Take 1 tablet (4 mg total) by mouth 2 (two) times daily with a meal. Take twice daily x 2 days after each chemotherapy treatment beginning day 2 of each cycle Patient not taking: Reported on 02/25/2022 02/08/22   Owens Shark, NP  polyethylene glycol (MIRALAX / GLYCOLAX) 17 g packet Take 17 g by mouth 2 (two) times daily. Patient not taking: Reported on 04/15/2022 03/22/22   Raiford Noble Latif, DO  sorbitol 70 % solution Take 30 mLs by mouth daily as needed (Take 30 ml every 6 hours till bowel movement, then 30 ml daily). Patient not taking: Reported on 04/15/2022 04/01/22   Ladell Pier, MD     Vital Signs: BP 101/61 (BP Location: Left Arm)   Pulse 65   Temp 98.4 F (36.9 C) (Oral)   Resp 18   Ht '5\' 10"'$  (1.778 m)   Wt 101 lb 13.6 oz (  46.2 kg)   LMP 11/01/2018   SpO2 98%   BMI 14.61 kg/m   Physical Exam Vitals reviewed.  Constitutional:      General: She is not in acute distress.    Appearance: She is not ill-appearing.  HENT:     Head: Normocephalic.  Pulmonary:     Effort: Pulmonary effort is normal.  Abdominal:     General: Abdomen is flat.  Skin:    General: Skin is warm and dry.     Coloration: Skin is not cyanotic or jaundiced.     Comments: Two punctures sited in the bad, covered with Band-Aid. No signs of bleeding or infection. Mild TTP on the right back puncture site.   Neurological:     Mental Status: She is  alert.  Psychiatric:        Mood and Affect: Mood normal.        Behavior: Behavior normal.     Imaging: CT CELIAC PLEXUS BLOCK NEUROLYTIC  Result Date: 04/22/2022 INDICATION: persistent abdominal pain in setting of metastatic pancreatic cancer EXAM: CT-GUIDED PERCUTANEOUS CELIAC PLEXUS NEUROLYSIS COMPARISON:  CT AP, most recently 04/15/2022. IR celiac plexus block, 03/11/2022. MEDICATIONS: 30 mL 0.5% Bupivacaine and 30 mL 99% denatured EtOH. ANESTHESIA/SEDATION: Moderate (conscious) sedation was employed during this procedure. A total of Versed 4 mg and Fentanyl 100 mcg was administered intravenously. Moderate Sedation Time: 40 minutes. The patient's level of consciousness and vital signs were monitored continuously by radiology nursing throughout the procedure under my direct supervision. CONTRAST:  2 mL Omnipaque 300 FLUOROSCOPY TIME:  CT dose in mGy was not provided. COMPLICATIONS: None immediate. PROCEDURE: RADIATION DOSE REDUCTION: This exam was performed according to the departmental dose-optimization program which includes automated exposure control, adjustment of the mA and/or kV according to patient size and/or use of iterative reconstruction technique. Informed consent was obtained from the patient following an explanation of the procedure, risks, benefits and alternatives. A time out was performed prior to the initiation of the procedure. The patient was positioned prone on the CT table and a limited CT was performed for procedural planning demonstrating an adequate window at the upper abdomen. The procedure was planned. A timeout was performed prior to the initiation of the procedure. The operative site was prepped and draped in the usual sterile fashion. Appropriate trajectory was confirmed with a 22 gauge spinal needle after the adjacent tissues were anesthetized with 1% Lidocaine with epinephrine. Under intermittent CT guidance, 15 cm 21-gauge Chiba needles were inserted via a posterior  approach and the needle tips were positioned in the retroperitoneal space immediately anterolateral to the aorta, at the region of the celiac trunk. A small amount of dilute contrast was injected through the needles to confirm position. A solution containing 0.5% bupivacaine and 99% denatured EtOH was mixed and injected through each needle. Intermittent CT scanning confirmed appropriate spread into the retroperitoneal extra vascular space about the celiac axis. The needles were removed and hemostasis was achieved with manual compression. A limited postprocedural CT was negative for hemorrhage or additional complication. A dressing was placed. The patient tolerated the procedure well without immediate postprocedural complication. FINDINGS: 1. Bilateral percutaneous approach with needle tips positioned anterolateral to the celiac axis, the expected location of the celiac plexus. 2. Successful injection of local anesthetic and neurolytic with appropriate spread into the retroperitoneal extra vascular space about the celiac axis. 3. Post treatment imaging was negative for acute complication, specifically, no pneumothorax or hemorrhage about the injection site. IMPRESSION: Successful CT-guided  therapeutic celiac plexus neurolysis, via percutaneous bilateral posterior approach, as above. Michaelle Birks, MD Vascular and Interventional Radiology Specialists [facility] Radiology Electronically Signed   By: Michaelle Birks M.D.   On: 04/22/2022 13:07    Labs:  CBC: Recent Labs    04/16/22 0515 04/16/22 1102 04/22/22 1155 04/23/22 0559  WBC 5.1 5.1 9.4 5.3  HGB 12.4 12.4 12.0 10.7*  HCT 38.9 38.9 38.2 33.9*  PLT 147* 142* 208 147*    COAGS: Recent Labs    03/11/22 0854 04/16/22 0515  INR 1.0 1.0    BMP: Recent Labs    12/27/21 0925 12/27/21 1008 04/15/22 1008 04/16/22 0515 04/16/22 1102 04/19/22 1330  NA QUESTIONABLE RESULTS, RECOMMEND RECOLLECT TO VERIFY   < > 137 137 133* 133*  K QUESTIONABLE  RESULTS, RECOMMEND RECOLLECT TO VERIFY   < > 3.7 4.1 3.9 3.4*  CL QUESTIONABLE RESULTS, RECOMMEND RECOLLECT TO VERIFY   < > 100 100 102 98  CO2 QUESTIONABLE RESULTS, RECOMMEND RECOLLECT TO VERIFY   < > '30 29 28 29  '$ GLUCOSE QUESTIONABLE RESULTS, RECOMMEND RECOLLECT TO VERIFY   < > 121* 104* 138* 165*  BUN QUESTIONABLE RESULTS, RECOMMEND RECOLLECT TO VERIFY   < > '14 13 15 '$ 21*  CALCIUM QUESTIONABLE RESULTS, RECOMMEND RECOLLECT TO VERIFY   < > 9.5 9.3 8.8* 8.7*  CREATININE QUESTIONABLE RESULTS, RECOMMEND RECOLLECT TO VERIFY   < > 0.43* 0.48 0.43* 0.36*  GFRNONAA QUESTIONABLE RESULTS, RECOMMEND RECOLLECT TO VERIFY   < > >60 >60 >60 >60  GFRAA QUESTIONABLE RESULTS, RECOMMEND RECOLLECT TO VERIFY  --   --   --   --   --    < > = values in this interval not displayed.    LIVER FUNCTION TESTS: Recent Labs    04/15/22 1008 04/16/22 0515 04/16/22 1102 04/19/22 1330  BILITOT 0.3 0.4 0.5 0.4  AST 11* 14* 15 12*  ALT '11 15 14 13  '$ ALKPHOS 49 64 64 66  PROT 7.0 7.3 7.2 6.8  ALBUMIN 3.9 3.8 3.8 3.6    Assessment and Plan:  57 y.o. female with metastatic pancreatic cancer and intractable abdominal pain, s/p celiac plexus block on 03/11/22 with no significant relief, s/p complete celiac plexus nerve neurolysis by Dr. Maryelizabeth Kaufmann on 04/22/22.   On Fentanyl PCA, last used this morning at 4 am - patient does not recall this, states that she did not wake up due to pain On Dialudid '2mg'$  PRN, last used 3/11 2124 hrs   On Fentanyl patch and lidocaine patch   Patient reports that she had rough night yesterday but feeling slightly better this morning. States that she will most likely going home with the Fentanyl and Lidocaine patches.    Further treatment plan per [facility], palliative, med onc.  Appreciate and agree with the plan.  Please call IR for questions and concerns.    Electronically Signed: Tera Mater, PA-C 04/23/2022, 9:59 AM   I spent a total of 15 Minutes at the the patient's bedside AND on the  patient's hospital floor or unit, greater than 50% of which was counseling/coordinating care for s/p complete celiac plexus nerve neurolysis  This chart was dictated using voice recognition software.  Despite best efforts to proofread,  errors can occur which can change the documentation meaning.

## 2022-04-23 NOTE — Progress Notes (Signed)
Mobility Specialist - Progress Note   04/23/22 1015  Mobility  Activity Ambulated with assistance in hallway  Level of Assistance Modified independent, requires aide device or extra time  Assistive Device None  Distance Ambulated (ft) 750 ft  Activity Response Tolerated well  Mobility Referral Yes  $Mobility charge 1 Mobility   Pt received in bed and agreed to mobility, had no c/o pain nor discomfort during session. Pt returned to chair with all needs met requesting another session later.  Roderick Pee Mobility Specialist

## 2022-04-23 NOTE — Progress Notes (Signed)
Palliative Care Progress Note  Tara Jensen is s/p Celiac plexus block yesterday AM. Feels exhausted after walking with mobility specialist. Still having some pain and tenderness at procedure site but no redness or swelling-no palpable masses or muscle spasm palpated. Pain radiates mostly superficially from injection site around posterior rib cage into her flank on her right side. She woke up this AM with an improved appetite. She ambulated in the hallway and reports two nights where she was able to get restorative sleep. She has used her PCA only one demand in the past 24 hours. Received one RN administered injection of hydromorphone last PM.  Exam: Alert, engaged in her care. Lying semi prone for back pain relief, appears slightly uncomfortable but she is not in severe distress.Palpated her back-no abnormalities. Repositioned her lidoderm patch over painful area. No jaundice or skin irritations.  Assessment and Recommendations:  Cancer Related Pain: Pancreatic/SMA Syndrome/Post-Procedure  Cancer Related Fatigue Opioid Induced Constipation Malnutrition/Reduced Appetite   Pain is significantly improved, but still making minor adjustments. Will move towards a home regimen-d/c PCA today. PO hyddromorphone and IV backup Hopeful CPB will be helpful and will also be opioid sparing. Continue dexamethasone, long taper over 2-3 weeks, ok to switch to oral Continue Celebrex Some drowsiness-will change gabapentin back to 300-300-600 dosing schedule. Boost Breeze Soothe Supplement TID Will try Kinesiotape to her low back/flank for non-pharm pain intervention/ICE/HEAT/PRONE  Continue Provigil Cymbalta increase to '30mg'$  daily. Continue Relistor QOD  Discharge possibly tomorrow -will reassess her in AM. I discussed her care in detail with Dr. Benay Spice this afternoon.Will further discuss the advantage of earlier hospice care and at what point she may consider transitioning from palliative care -we will see her  in follow-up in our clinic soon after discharge and check in with her frequently.  Lane Hacker, DO Palliative Medicine

## 2022-04-23 NOTE — Progress Notes (Signed)
  Progress Note   Patient: Tara Jensen [id number] DOB: 02-21-1965 DOA: 04/15/2022     7 DOS: the patient was seen and examined on 04/23/2022        Brief hospital course: Tara Jensen is a 57 y.o. F with hx locally advanced pancreatic CA who presented with worsening abdominal pain, N/V.        Assessment and Plan: Cancer of pancreas, body ([place]) Intractable pain  Depression Therapeutic opioid-induced constipation (OIC) Pancytopenia (HCC) Protein-calorie malnutrition, severe Patient under the care of Dr. Hilma Favors, who is managing adjustments of her: Fentanyl patch IV Dilaudid Celebrex Dexamethasone Gabapentin Modafinil  - Continue duloxetine - Continue Relistor - Continue Pepcid          Subjective: PCA pump turned off and doing well without it.  Still seems to have fairly high level of pain, fatigue, sleepiness.     Physical Exam: BP 97/63 (BP Location: Left Arm)   Pulse 68   Temp 99 F (37.2 C) (Oral)   Resp 16   Ht 5\' 10"  (1.778 m)   Wt 46.2 kg   LMP 11/01/2018   SpO2 96%   BMI 14.61 kg/m   Patient seen and appeared to be nearing readiness for discharge.  This is a no charge note.    Disposition: Status is: Inpatient Patient admitted for pain control in setting of pancreatic cancer  Palliative Care were consulted and have been managing adjustments to her regimen.  She has no separate medical problems requiring evaluation and management, separate from those being served by Dr. Hilma Favors.  I expect she will be stable on her home regimen and ready for discharge tomorrow or Thursday, pending Palliative Care clearance.        Author: Edwin Dada, MD 04/23/2022 5:38 PM  For on call review [email or web address]

## 2022-04-24 ENCOUNTER — Encounter: Payer: Self-pay | Admitting: *Deleted

## 2022-04-24 DIAGNOSIS — R52 Pain, unspecified: Secondary | ICD-10-CM | POA: Diagnosis not present

## 2022-04-24 LAB — CANCER ANTIGEN 19-9: CA 19-9: 3261 U/mL — ABNORMAL HIGH (ref 0–35)

## 2022-04-24 NOTE — Progress Notes (Signed)
IP PROGRESS NOTE  Subjective:   [name] has discontinued the fentanyl PCA.  She is on a fentanyl patch.  She is not requiring Dilaudid for breakthrough pain. She underwent a celiac plexus block procedure on 04/22/2022.  She feels the nerve block has helped.  She has an improved appetite.  Her bowels move when she takes Relistor. Objective: Vital signs in last 24 hours: Blood pressure (!) 101/54, pulse 66, temperature 98 F (36.7 C), temperature source Oral, resp. rate 14, height '5\' 10"'$  (1.778 m), weight 101 lb 13.6 oz (46.2 kg), last menstrual period 11/01/2018, SpO2 99 %.  Intake/Output from previous day: 03/12 0701 - 03/13 0700 In: 240 [P.O.:240] Out: -   Physical Exam: Lungs: clear bilaterally Cardiac: RRR Abdomen:soft, nontender Extremities: No leg edema   Portacath/PICC-without erythema   Lab Results  Component Value Date   [id number] 3,261 (H) 04/21/2022    Medications: I have reviewed the patient's current medications.  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  Less than 180 degree abutment of the distal celiac, common hepatic, and splenic arteries, less than 180 degree abutment of the SMA CT chest 07/18/2021-no evidence of metastatic disease EUS 07/16/2021-extrinsic impression on the stomach at the posterior wall of the gastric body, no gross lesion in the duodenum, 45 x 36 mm pancreas body mass, abutment of the SMA, celiac trunk, and compression of the splenoportal confluence.  No malignant appearing lymph nodes, numerous venous collaterals adjacent to the portal vein, FNA biopsy-malignant cells consistent with adenocarcinoma, T4N0 by EUS Markedly elevated CA 19-9 Guardant360 07/31/2021-K-ras G12R, MSI high-not detected, ATM VUS Cycle 1 FOLFOX  08/08/2021 Cycle 2 FOLFIRINOX 08/22/2021 CT abdomen[facility]pelvis at [facility] 08/31/2021-hypoattenuating liver lesions consistent with metastases, mass in the pancreas neck/body with greater than 100 degree encasement of the celiac axis and common hepatic artery.  Less than 180 degree abutment of the SMA, long segment occlusion of the portal splenic confluence, splenic vein occluded, multiple collateral vessels in the upper abdomen, prominent left periaortic node Cycle 3 FOLFIRINOX 09/05/2021 Cycle 4 FOLFIRINOX 09/19/2021 Cycle 5 FOLFIRINOX 10/03/2021 CTs 10/16/2021-decrease size of primary pancreas mass, hepatic metastases, and a suspicious retroperitoneal node, resolution of left supraclavicular/low jugular adenopathy compared to a CT chest 07/18/2021.  No evidence of disease progression. Cycle 6 FOLFIRINOX 10/17/2021 Cycle 7 FOLFIRINOX 10/31/2021 Cycle 8 FOLFIRINOX 11/14/2021 Cycle 9 FOLFIRINOX 11/28/2021 Cycle 10 FOLFIRINOX 12/12/2021, oxaliplatin held secondary to neuropathy symptoms CTs 12/20/2021-decrease size of pancreas primary, no evidence of hepatic metastases, no new or progressive disease Cycle 11 FOLFIRINOX 12/27/2021, oxaliplatin held due to neuropathy symptoms Cycle 12 FOLFIRINOX 01/09/2022, oxaliplatin held 01/22/2022-C19-9 higher, 530 Cycle 13 FOLFIRINOX 01/24/2022, oxaliplatin resumed Cycle 14 FOLFIRINOX 02/06/2022 CTs 02/23/2022-increased size of the pancreas body mass new focal gastric wall thickening and loss of fat plane between the mass and the proximal gastric wall, no evidence of new or progressive metastatic disease Cycle 1 gemcitabine/Abraxane 03/06/2022 Cycle 2 gemcitabine/Abraxane 03/20/2022 CT abdomen/pelvis was 03/20/2022-slight interval increase in size of the pancreas neck/body mass with extension to the posterior gastric antrum Cycle 3 gemcitabine/Abraxane 04/04/2022-dose reduction secondary to thrombocytopenia CT abdomen/pelvis 04/14/2021-increase size of the pancreas neck/body mass with  probable involvement of the gastric wall, progressive encasement of the celiac artery with chronic involvement of the portal splenic confluence Pain secondary to #1-Dilaudid drip started 04/18/2022, converted to a fentanyl  drip followed by a fentanyl patch and oral Dilaudid for breakthrough pain Celiac plexus block 03/11/2022 Celiac plexus block 04/22/2022 Weight loss secondary to #1 History of situational depression Family history of breast cancer 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 has a history of metastatic pancreas cancer.  She was most recently treated with gemcitabine/Abraxane.  Her clinical status has declined while on the current treatment and a CT 04/15/2022 reveals enlargement of the primary tumor.  The CA 19-9 is higher.  She has no evidence of systemic progression at present.  She has increased abdomen/back pain likely secondary to local progression of tumor.  The pain is under better control with fentanyl and following the celiac plexus block.   [name] is being followed by Dr. Hilma Favors in the palliative care service.  She reports adequate pain control at present.  She can be discharged to home if her pain remains controlled and she is ambulatory.  We will arrange for outpatient follow-up at the Cancer center.  We will discuss palliative Xeloda/radiation, salvage systemic therapy, and hospice care. Discussed the case with Dr. Hilma Favors yesterday. Recommendations: 1.  Pain management per palliative care medicine, add oral Dilaudid for breakthrough pain  2.  Outpatient palliative care 3.  Steroid taper per Dr. Hilma Favors 4.  Bowel regimen as recommended by Dr. Collene Mares 5.  Outpatient follow-up will be scheduled at the Cancer center for the week of  04/29/2022    LOS: 8 days   Betsy Coder, MD   04/24/2022, 8:11 AM

## 2022-04-24 NOTE — Progress Notes (Signed)
Oncology Discharge Planning Note  [facility] Address: [address] [place], [place], [place] [address] Hours of Operation:  Nena Polio, Monday - Friday  Clinic Contact Information:  [phone]  Oncology Care Team: Medical Oncologist:  Benay Spice  Patient Details: Name:  Tara Jensen, Navia MRN:   [id number] DOB:   Jul 02, 1965 Reason for Current Admission: '@PPROB'$ @  Discharge Planning Narrative: Notification of admission received by Inpatient team for [facility].  Discharge follow-up appointments for oncology are current and available on the AVS and MyChart.   Upon discharge from the hospital, hematology/oncology's post discharge plan of care for the outpatient setting is: May 01, 2022 [facility] [place], [place] [id number] [phone]   Astrid Michaels will be called within two business days after discharge to review hematology/oncology's plan of care for full understanding.    Outpatient Oncology Specific Care Only: Oncology appointment transportation needs addressed?:  yes Oncology medication management for symptom management addressed?:  yes Chemo Alert Card reviewed?:  yes Immunotherapy Alert Card reviewed?:  no

## 2022-04-24 NOTE — Progress Notes (Signed)
Progress Note    Tara Jensen   [id number]  DOB: 1965/12/04  DOA: 04/15/2022     8 PCP: Chesley Noon, MD  Initial CC: abdominal pain  Hospital Course: Tara Jensen is a 57 yo female with PMH pancreatic cancer, chemo induced neuropathy, GERD, chronic anxiety/depression who originally presented with abdominal pain and nausea.  She is followed outpatient by Dr. Benay Spice. CT A/P performed on 04/15/2022 showed increased size of the pancreatic neck/body mass with presumed involvement of the gastric wall along with progressive encasement of the celiac artery and proximal branches. Pain control was difficult to achieve and ultimately palliative care was consulted for assistance with pain management.   Interval History:  No events overnight.  Pain has been much better controlled after palliative care assistance. She has been able to ambulate with much less difficulty.  Assessment and Plan:  Cancer of pancreas, body ([place]) Intractable pain  Depression Therapeutic opioid-induced constipation (OIC) Pancytopenia (HCC) Protein-calorie malnutrition, severe Patient under the care of Dr. Hilma Favors, who is managing adjustments of her: Fentanyl patch IV Dilaudid Celebrex Dexamethasone Gabapentin Modafinil   - Continue duloxetine - Continue Relistor - Continue Pepcid    Old records reviewed in assessment of this patient    DVT prophylaxis:  enoxaparin (LOVENOX) injection 40 mg Start: 04/22/22 1700   Code Status:   Code Status: DNR  Mobility Assessment (last 72 hours)     Mobility Assessment     Row Name 04/24/22 0745 04/23/22 2144 04/22/22 2025 04/22/22 1201     Does patient have an order for bedrest or is patient medically unstable No - Continue assessment No - Continue assessment No - Continue assessment No - Continue assessment    What is the highest level of mobility based on the progressive mobility assessment? Level 6 (Walks independently in room and hall) - Balance  while walking in room without assist - Complete Level 6 (Walks independently in room and hall) - Balance while walking in room without assist - Complete Level 4 (Walks with assist in room) - Balance while marching in place and cannot step forward and back - Complete Level 4 (Walks with assist in room) - Balance while marching in place and cannot step forward and back - Complete             Barriers to discharge: none Disposition Plan:  Home Status is: Inpt  Objective: Blood pressure (!) 101/54, pulse 66, temperature 98 F (36.7 C), temperature source Oral, resp. rate 14, height '5\' 10"'$  (1.778 m), weight 46.2 kg, last menstrual period 11/01/2018, SpO2 99 %.  Examination:  Physical Exam Constitutional:      General: She is not in acute distress.    Appearance: Normal appearance.  HENT:     Head: Normocephalic and atraumatic.     Mouth/Throat:     Mouth: Mucous membranes are moist.  Eyes:     Extraocular Movements: Extraocular movements intact.  Cardiovascular:     Rate and Rhythm: Normal rate and regular rhythm.  Pulmonary:     Effort: Pulmonary effort is normal. No respiratory distress.     Breath sounds: Normal breath sounds. No wheezing.  Chest:     Comments: Tender on mid-lower back over nerve block site  Abdominal:     General: Bowel sounds are normal. There is no distension.     Palpations: Abdomen is soft.     Tenderness: There is no abdominal tenderness.  Musculoskeletal:     Cervical back: Normal range  of motion and neck supple.  Neurological:     Mental Status: She is alert.      Consultants:  Palliative care  Procedures:  04/22/2022: CT-guided celiac plexus nerve neurolysis  Data Reviewed: No results found for this or any previous visit (from the past 24 hour(s)).  I have reviewed pertinent nursing notes, vitals, labs, and images as necessary. I have ordered labwork to follow up on as indicated.  I have reviewed the last notes from staff over past 24  hours. I have discussed patient's care plan and test results with nursing staff, CM/SW, and other staff as appropriate.  Time spent: Greater than 50% of the 55 minute visit was spent in counseling/coordination of care for the patient as laid out in the A&P.   LOS: 8 days   Dwyane Dee, MD [facility] 04/24/2022, 3:05 PM

## 2022-04-24 NOTE — Progress Notes (Signed)
Mobility Specialist - Progress Note   04/24/22 1359  Mobility  Activity Ambulated with assistance in hallway  Level of Assistance Modified independent, requires aide device or extra time  Assistive Device None  Distance Ambulated (ft) 1500 ft  Activity Response Tolerated well  Mobility Referral Yes  $Mobility charge 1 Mobility   Pt received in chair and agreed to mobility, some pain in R side, eased as she slowed her gait. Pt returned to chair with all needs met.  Roderick Pee Mobility Specialist

## 2022-04-24 NOTE — Progress Notes (Signed)
Chaplain engaged in an initial visit with Tara Jensen. Chaplain also engaged in narrative life review, learning about Tara Jensen's healthcare journey and significant experiences of work and loss.   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 She started a clinic in [place] for lower income, impoverished citizens to receive free medical care from physicians, nurses, dentists, and more. She worked diligently as Clinical biochemist for 9 years thinking deeply about helping those in need. She has also been a huge advocate to fight against drug addiction and the ways one choice can change someone's life forever. This cause comes out of her late son's addiction and death.   Tara Jensen was able to candidly share her grief in losing her son and the ways she believes that the traumatic grief of that loss showed up in her body. As Tara Jensen does not have a family history of cancer, she noted that her dear son's death has also been hers. Chaplain and Denisse were able to discuss addiction as a disease and to think about how her journey closely coincides with her son, yet has such important differences.   Tara Jensen has leaned upon her faith and has always believed that no matter where her journey takes her, she wins. Though she is not ready to die, she is accepting of whatever plan the Divine has for her. Working with [facility] has also been significant for her. She praised the way their specialized care has helped her to accept a new pain regimen and understand her capacity concerning interventions.   Chaplain offered reflective listening, a compassionate presence, support, and affirmation.

## 2022-04-25 DIAGNOSIS — Z515 Encounter for palliative care: Secondary | ICD-10-CM

## 2022-04-25 MED ORDER — DULOXETINE HCL 30 MG PO CPEP: 30.0000 mg | ORAL_CAPSULE | Freq: Every day | ORAL | 0 refills | Status: DC

## 2022-04-25 MED ORDER — HYDROMORPHONE HCL 4 MG PO TABS: 2.0000 mg | ORAL_TABLET | ORAL | 0 refills | Status: DC | PRN

## 2022-04-25 MED ORDER — FENTANYL 12 MCG/HR TD PT72: 1.0000 | MEDICATED_PATCH | TRANSDERMAL | 0 refills | Status: DC

## 2022-04-25 MED ORDER — APIXABAN 2.5 MG PO TABS: 2.5000 mg | ORAL_TABLET | Freq: Two times a day (BID) | ORAL | 0 refills | Status: DC

## 2022-04-25 MED ORDER — DEXAMETHASONE 4 MG PO TABS: ORAL_TABLET | ORAL | 1 refills | Status: DC

## 2022-04-25 MED ORDER — ADULT MULTIVITAMIN W/MINERALS CH: 1.0000 | ORAL_TABLET | Freq: Every day | ORAL | Status: DC

## 2022-04-25 MED ORDER — SYMPROIC 0.2 MG PO TABS: ORAL_TABLET | ORAL | Status: DC

## 2022-04-25 MED ORDER — HEPARIN SOD (PORK) LOCK FLUSH 100 UNIT/ML IV SOLN
500.0000 [IU] | Freq: Once | INTRAVENOUS | Status: DC
  Filled 2022-04-25: qty 5

## 2022-04-25 MED ORDER — FENTANYL 25 MCG/HR TD PT72: 1.0000 | MEDICATED_PATCH | TRANSDERMAL | 0 refills | Status: DC

## 2022-04-25 MED ORDER — CELECOXIB 200 MG PO CAPS: 200.0000 mg | ORAL_CAPSULE | Freq: Two times a day (BID) | ORAL | 0 refills | Status: DC

## 2022-04-25 MED ORDER — LIDOCAINE 5 % EX PTCH: 1.0000 | MEDICATED_PATCH | Freq: Every day | CUTANEOUS | 0 refills | Status: DC

## 2022-04-25 MED ORDER — TRAZODONE HCL 50 MG PO TABS: 50.0000 mg | ORAL_TABLET | Freq: Every day | ORAL | 0 refills | Status: DC

## 2022-04-25 MED ORDER — MODAFINIL 100 MG PO TABS: 100.0000 mg | ORAL_TABLET | Freq: Every day | ORAL | 0 refills | Status: DC

## 2022-04-25 NOTE — Progress Notes (Signed)
Palliative Care Progress Note  Tara Jensen is much improved. Her appetite has returned and she is power walking in the halls. She reports being extremely deconditioned from her baseline but feels like she is making progress. She is optimistic and hopeful.    Assessment: Cancer Related Pain: Dramatically improved. Pain is under good control with Duragesic TD, as needed hydromorphone but minimal breakthrough.She still has mild to moderate superficial pain at and around the injection site, worse with walking and movement-pain is around the T12 level and radiated around her right flank. I provided reassurance that this is normal after CPB and should resolve over the next few days. Placed KT tape today around the injection site along paraspinals to help with pain. Regimen as follows:  1. Duragesic 37.40mg q72  2. Hydromorphone '4mg'$  q3 prn  3. Cymbalta '30mg'$  QD  4. Gabapentin '300mg'$  TID  5. Celebrex '200mg'$  BID  6. Decadron long taper  Constipation: Resolved- responded well to relistor -will discharge on oral equivalent for OIC   Cancer Related Fatigue: Provigil '100mg'$  daily  Nutrition: Supplements recommended, Magnesium supplementation, MVI  DVT prophylaxis: High risk for VTE on steroids, advanced cancer and recent prolonged immobility. Eliquis 2.5 BID.  [facility] if needed. Ready for discharge in [place] Palliative Medicine  Time :35 min

## 2022-04-25 NOTE — Progress Notes (Signed)
Palliative Care Progress Note  Patient continues to do well on current medication. She has had no additional need for PRN medications. She is eating much better and increasing her activity level. Plan is for discharge home today. I have done a complete medication reconciliation with her along with her caregivers. RN provided education on Transdermal patch placement. I also showed them how to apply KT tape to her back and flank for superficial and radiating pain. Confirmed prior authorizations were complete. Appt next week scheduled in palliative onc clinic. Waiting on Provigil and lidoderm-others are ready.  Answered all of the patient and caregiver questions and provided reassurance that our team would continue to support her through her cancer journey.  Care plan discussed with RN, hospitalist and outpatient palliative onc team.  Lane Hacker, DO Palliative Medicine   Time: 90 minutes

## 2022-04-25 NOTE — Progress Notes (Signed)
Mobility Specialist - Progress Note   04/25/22 0923  Mobility  Activity Ambulated with assistance in hallway  Level of Assistance Modified independent, requires aide device or extra time  Assistive Device None  Distance Ambulated (ft) 800 ft  Activity Response Tolerated well  Mobility Referral Yes  $Mobility charge 1 Mobility   Pt received in bed and agreed to mobility, some slight pain during session, advised to slow down her pace to alleviate pain and it worked. Pt returned to bed with all needs met.  Roderick Pee Mobility Specialist

## 2022-04-25 NOTE — Discharge Summary (Signed)
Physician Discharge Summary   Tara Jensen [id number] DOB: 29-Aug-1965 DOA: 04/15/2022  PCP: Tara Noon, MD  Admit date: 04/15/2022 Discharge date: 04/25/2022   Admitted From: Home Disposition:  Home Discharging physician: Tara Dee, MD Barriers to discharge: none  Recommendations for Outpatient Follow-up:  Follow up with Dr. Benay Jensen    Discharge Condition: stable CODE STATUS: DNR Diet recommendation:  Diet Orders (From admission, onward)     Start     Ordered   04/25/22 0000  Diet general        04/25/22 1043   04/22/22 1138  Diet regular Room service appropriate? Yes; Fluid consistency: Thin  Diet effective now       Question Answer Comment  Room service appropriate? Yes   Fluid consistency: Thin      04/22/22 1137            Hospital Course: [name] is a 56 yo female with PMH pancreatic cancer, chemo induced neuropathy, GERD, chronic anxiety/depression who originally presented with abdominal pain and nausea.  She is followed outpatient by Dr. Benay Jensen. CT A/P performed on 04/15/2022 showed increased size of the pancreatic neck/body mass with presumed involvement of the gastric wall along with progressive encasement of the celiac artery and proximal branches. Pain control was difficult to achieve and ultimately palliative care was consulted for assistance with pain management.   Assessment and Plan:  Cancer of pancreas, body (HCC) Intractable pain  Depression Therapeutic opioid-induced constipation (OIC) Pancytopenia (HCC) Protein-calorie malnutrition, severe  - Patient symptoms managed by Dr. Hilma Jensen, greatly appreciate assistance  - continued on fentanyl patches, celebrex, decadron, cymbalta, gabapentin, dilaudid, lidocaine, modafinil, symproic   The patient's chronic medical conditions were treated accordingly per the patient's home medication regimen except as noted.  On day of discharge, patient was felt deemed stable for discharge.  Patient/family member advised to call PCP or come back to ER if needed.   Principal Diagnosis: Intractable pain  Discharge Diagnoses: Active Hospital Problems   Diagnosis Date Noted   Intractable pain 04/16/2022    Priority: 1.   Palliative care patient 04/25/2022   Pancytopenia ([place]) 04/15/2022   Therapeutic opioid-induced constipation (OIC) 04/15/2022   Depression 04/15/2022   Protein-calorie malnutrition, severe 03/21/2022   Cancer of pancreas, body ([place]) 07/24/2021    Resolved Hospital Problems  No resolved problems to display.     Discharge Instructions     Diet general   Complete by: As directed    Increase activity slowly   Complete by: As directed    No wound care   Complete by: As directed       Allergies as of 04/25/2022   No Known Allergies      Medication List     STOP taking these medications    diclofenac Sodium 1 % Gel Commonly known as: VOLTAREN   Linzess 290 MCG Caps capsule Generic drug: linaclotide   morphine 30 MG 12 hr tablet Commonly known as: MS CONTIN   ondansetron 8 MG tablet Commonly known as: ZOFRAN   polyethylene glycol 17 g packet Commonly known as: MIRALAX / GLYCOLAX   predniSONE 10 MG tablet Commonly known as: DELTASONE   prochlorperazine 5 MG tablet Commonly known as: COMPAZINE   sertraline 25 MG tablet Commonly known as: ZOLOFT   sorbitol 70 % solution       TAKE these medications    apixaban 2.5 MG Tabs tablet Commonly known as: Eliquis Take 1 tablet (2.5 mg total) by mouth 2 (  two) times daily.   celecoxib 200 MG capsule Commonly known as: CELEBREX Take 1 capsule (200 mg total) by mouth 2 (two) times daily.   dexamethasone 4 MG tablet Commonly known as: DECADRON Take one tablet every AM or as directed by provider What changed:  how much to take how to take this when to take this additional instructions   DULoxetine 30 MG capsule Commonly known as: CYMBALTA Take 1 capsule (30 mg total) by mouth  daily.   famotidine 40 MG tablet Commonly known as: PEPCID Take 40 mg by mouth 2 (two) times daily.   fentaNYL 12 MCG/HR Commonly known as: [place] 1 patch onto the skin every 3 (three) days. Place patch with 30mg patch q72 hours for total of 37.5 mcg   fentaNYL 25 MCG/HR Commonly known as: [place] patch onto the skin every 3 (three) days. Place in addition to 12 mcg patch for total of 396m Start taking on: April 26, 2022   gabapentin 300 MG capsule Commonly known as: NEURONTIN Take 1 capsule (300 mg total) by mouth 3 (three) times daily.   HYDROmorphone 4 MG tablet Commonly known as: Dilaudid Take 0.5-1 tablets (2-4 mg total) by mouth every 4 (four) hours as needed for severe pain. What changed: medication strength   lidocaine 5 % Commonly known as: LIDODERM Place 1 patch onto the skin daily. Remove & Discard patch within 12 hours or as directed by MD   lidocaine-prilocaine cream Commonly known as: EMLA Apply 1 Application topically as needed.   modafinil 100 MG tablet Commonly known as: PROVIGIL Take 1 tablet (100 mg total) by mouth daily with breakfast.   multivitamin with minerals Tabs tablet Take 1 tablet by mouth daily.   pantoprazole 40 MG tablet Commonly known as: PROTONIX Take 40 mg by mouth daily before breakfast.   Symproic 0.2 MG Tabs Generic drug: Naldemedine Tosylate Take as directed.   traZODone 50 MG tablet Commonly known as: DESYREL Take 1 tablet (50 mg total) by mouth at bedtime.        No Known Allergies  Consultations: Oncology Palliative care   Discharge Exam: BP (!) 103/59   Pulse 67   Temp 98.2 F (36.8 C) (Oral)   Resp 18   Ht '5\' 10"'$  (1.778 m)   Wt 46.2 kg   LMP 11/01/2018   SpO2 96%   BMI 14.61 kg/m  Physical Exam Constitutional:      General: She is not in acute distress.    Appearance: Normal appearance.  HENT:     Head: Normocephalic and atraumatic.     Mouth/Throat:     Mouth: Mucous  membranes are moist.  Eyes:     Extraocular Movements: Extraocular movements intact.  Cardiovascular:     Rate and Rhythm: Normal rate and regular rhythm.  Pulmonary:     Effort: Pulmonary effort is normal. No respiratory distress.     Breath sounds: Normal breath sounds. No wheezing.  Chest:     Comments: Tender on mid-lower back over nerve block site  Abdominal:     General: Bowel sounds are normal. There is no distension.     Palpations: Abdomen is soft.     Tenderness: There is no abdominal tenderness.  Musculoskeletal:     Cervical back: Normal range of motion and neck supple.  Neurological:     Mental Status: She is alert.      The results of significant diagnostics from this hospitalization (including imaging, microbiology, ancillary and  laboratory) are listed below for reference.   Microbiology: No results found for this or any previous visit (from the past 240 hour(s)).   Labs: BNP (last 3 results) No results for input(s): "BNP" in the last 8760 hours. Basic Metabolic Panel: Recent Labs  Lab 04/19/22 1330  NA 133*  K 3.4*  CL 98  CO2 29  GLUCOSE 165*  BUN 21*  CREATININE 0.36*  CALCIUM 8.7*  MG 2.1   Liver Function Tests: Recent Labs  Lab 04/19/22 1330  AST 12*  ALT 13  ALKPHOS 66  BILITOT 0.4  PROT 6.8  ALBUMIN 3.6   No results for input(s): "LIPASE", "AMYLASE" in the last 168 hours. No results for input(s): "AMMONIA" in the last 168 hours. CBC: Recent Labs  Lab 04/22/22 1155 04/23/22 0559  WBC 9.4 5.3  NEUTROABS 5.7 3.9  HGB 12.0 10.7*  HCT 38.2 33.9*  MCV 101.3* 100.0  PLT 208 147*   Cardiac Enzymes: No results for input(s): "CKTOTAL", "CKMB", "CKMBINDEX", "TROPONINI" in the last 168 hours. BNP: Invalid input(s): "POCBNP" CBG: No results for input(s): "GLUCAP" in the last 168 hours. D-Dimer No results for input(s): "DDIMER" in the last 72 hours. Hgb A1c No results for input(s): "HGBA1C" in the last 72 hours. Lipid Profile No  results for input(s): "CHOL", "HDL", "LDLCALC", "TRIG", "CHOLHDL", "LDLDIRECT" in the last 72 hours. Thyroid function studies No results for input(s): "TSH", "T4TOTAL", "T3FREE", "THYROIDAB" in the last 72 hours.  Invalid input(s): "FREET3" Anemia work up No results for input(s): "VITAMINB12", "FOLATE", "FERRITIN", "TIBC", "IRON", "RETICCTPCT" in the last 72 hours. Urinalysis    Component Value Date/Time   COLORURINE YELLOW 03/20/2022 2313   APPEARANCEUR CLEAR 03/20/2022 2313   LABSPEC 1.018 03/20/2022 2313   PHURINE 8.0 03/20/2022 2313   GLUCOSEU NEGATIVE 03/20/2022 2313   HGBUR NEGATIVE 03/20/2022 2313   BILIRUBINUR NEGATIVE 03/20/2022 2313   KETONESUR 15 (A) 03/20/2022 2313   PROTEINUR TRACE (A) 03/20/2022 2313   NITRITE NEGATIVE 03/20/2022 2313   LEUKOCYTESUR NEGATIVE 03/20/2022 2313   Sepsis Labs Recent Labs  Lab 04/22/22 1155 04/23/22 0559  WBC 9.4 5.3   Microbiology No results found for this or any previous visit (from the past 240 hour(s)).  Procedures/Studies: CT CELIAC PLEXUS BLOCK NEUROLYTIC  Result Date: 04/22/2022 INDICATION: persistent abdominal pain in setting of metastatic pancreatic cancer EXAM: CT-GUIDED PERCUTANEOUS CELIAC PLEXUS NEUROLYSIS COMPARISON:  CT AP, most recently 04/15/2022. IR celiac plexus block, 03/11/2022. MEDICATIONS: 30 mL 0.5% Bupivacaine and 30 mL 99% denatured EtOH. ANESTHESIA/SEDATION: Moderate (conscious) sedation was employed during this procedure. A total of Versed 4 mg and Fentanyl 100 mcg was administered intravenously. Moderate Sedation Time: 40 minutes. The patient's level of consciousness and vital signs were monitored continuously by radiology nursing throughout the procedure under my direct supervision. CONTRAST:  2 mL Omnipaque 300 FLUOROSCOPY TIME:  CT dose in mGy was not provided. COMPLICATIONS: None immediate. PROCEDURE: RADIATION DOSE REDUCTION: This exam was performed according to the departmental dose-optimization program  which includes automated exposure control, adjustment of the mA and/or kV according to patient size and/or use of iterative reconstruction technique. Informed consent was obtained from the patient following an explanation of the procedure, risks, benefits and alternatives. A time out was performed prior to the initiation of the procedure. The patient was positioned prone on the CT table and a limited CT was performed for procedural planning demonstrating an adequate window at the upper abdomen. The procedure was planned. A timeout was performed prior to the initiation  of the procedure. The operative site was prepped and draped in the usual sterile fashion. Appropriate trajectory was confirmed with a 22 gauge spinal needle after the adjacent tissues were anesthetized with 1% Lidocaine with epinephrine. Under intermittent CT guidance, 15 cm 21-gauge Chiba needles were inserted via a posterior approach and the needle tips were positioned in the retroperitoneal space immediately anterolateral to the aorta, at the region of the celiac trunk. A small amount of dilute contrast was injected through the needles to confirm position. A solution containing 0.5% bupivacaine and 99% denatured EtOH was mixed and injected through each needle. Intermittent CT scanning confirmed appropriate spread into the retroperitoneal extra vascular space about the celiac axis. The needles were removed and hemostasis was achieved with manual compression. A limited postprocedural CT was negative for hemorrhage or additional complication. A dressing was placed. The patient tolerated the procedure well without immediate postprocedural complication. FINDINGS: 1. Bilateral percutaneous approach with needle tips positioned anterolateral to the celiac axis, the expected location of the celiac plexus. 2. Successful injection of local anesthetic and neurolytic with appropriate spread into the retroperitoneal extra vascular space about the celiac axis. 3.  Post treatment imaging was negative for acute complication, specifically, no pneumothorax or hemorrhage about the injection site. IMPRESSION: Successful CT-guided therapeutic celiac plexus neurolysis, via percutaneous bilateral posterior approach, as above. Michaelle Birks, MD [facility] Electronically Signed   By: Michaelle Birks M.D.   On: 04/22/2022 13:07   CT ABDOMEN PELVIS W CONTRAST  Result Date: 04/15/2022 CLINICAL DATA:  Generalized abdominal pain radiating to the back worsening over the last week. History of pancreatic cancer. * Tracking Code: BO * EXAM: CT ABDOMEN AND PELVIS WITH CONTRAST TECHNIQUE: Multidetector CT imaging of the abdomen and pelvis was performed using the standard protocol following bolus administration of intravenous contrast. RADIATION DOSE REDUCTION: This exam was performed according to the departmental dose-optimization program which includes automated exposure control, adjustment of the mA and/or kV according to patient size and/or use of iterative reconstruction technique. CONTRAST:  53m OMNIPAQUE IOHEXOL 350 MG/ML SOLN COMPARISON:  Multiple priors including most recent CT March 20, 2022 FINDINGS: Lower chest: No acute abnormality. Hepatobiliary: No suspicious hepatic lesion. Gallbladder is unremarkable. Biliary tree measures upper limits of normal at 6 mm, unchanged from prior. Pancreas: Heterogeneous lesion in the pancreatic neck/body junction measures 5.7 x 4.2 cm on image 25/2 previously 4.8 x 3.6 cm when remeasured for consistency. Similar atrophy and ductal dilation in the pancreatic tail. Spleen: No splenomegaly. Adrenals/Urinary Tract: Bilateral adrenal glands appear normal. No hydronephrosis. Kidneys demonstrate symmetric enhancement and excretion of contrast material. Urinary bladder is unremarkable for degree of distension. Stomach/Bowel: Similar loss of fat plane between the pancreatic mass in the gastric antral  wall with gastric wall thickening in this area for instance on image 28/2. Similar moderate distension of the stomach with gas and fluid. No evidence of bowel obstruction. Moderate volume of formed stool throughout the colon. Vascular/Lymphatic: Normal caliber abdominal aorta. 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 Abutment of the SMA. 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 Reproductive: Uterus and bilateral adnexa are unremarkable. Other: Trace pelvic free fluid. No discrete peritoneal or omental nodularity Musculoskeletal: No aggressive lytic or blastic lesion of bone. IMPRESSION: 1. Increased size of the pancreatic neck/body mass with probable involvement of the gastric wall and similar moderate fluid distension of the stomach. 2. 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 3. Trace pelvic free fluid. No discrete peritoneal or omental nodularity. 4. Moderate volume of formed stool throughout the colon. Correlate for constipation. Electronically Signed   By: Dahlia Bailiff M.D.   On: 04/15/2022 10:22     Time coordinating discharge: Over 99 minutes    Tara Dee, MD  [facility] 04/25/2022, 3:45 PM

## 2022-05-01 ENCOUNTER — Inpatient Hospital Stay: Payer: BC Managed Care – PPO

## 2022-05-01 ENCOUNTER — Other Ambulatory Visit: Payer: Self-pay | Admitting: Internal Medicine

## 2022-05-01 ENCOUNTER — Inpatient Hospital Stay: Payer: BC Managed Care – PPO | Admitting: Nutrition

## 2022-05-01 ENCOUNTER — Inpatient Hospital Stay: Payer: BC Managed Care – PPO | Admitting: Oncology

## 2022-05-01 DIAGNOSIS — C251 Malignant neoplasm of body of pancreas: Secondary | ICD-10-CM

## 2022-05-01 MED ORDER — DOCUSATE SODIUM 250 MG PO CAPS: 250.0000 mg | ORAL_CAPSULE | Freq: Two times a day (BID) | ORAL | 0 refills | Status: DC

## 2022-05-01 MED ORDER — BISACODYL 10 MG RE SUPP: 10.0000 mg | RECTAL | 0 refills | Status: DC | PRN

## 2022-05-01 MED ORDER — DIAZEPAM 2 MG PO TABS: 1.0000 mg | ORAL_TABLET | Freq: Two times a day (BID) | ORAL | 0 refills | Status: DC | PRN

## 2022-05-01 MED ORDER — FENTANYL 50 MCG/HR TD PT72: 1.0000 | MEDICATED_PATCH | TRANSDERMAL | 0 refills | Status: DC

## 2022-05-01 MED ORDER — HYDROMORPHONE HCL 4 MG PO TABS: 4.0000 mg | ORAL_TABLET | ORAL | 0 refills | Status: DC | PRN

## 2022-05-01 MED ORDER — METHYLNALTREXONE BROMIDE 12 MG/0.6ML ~~LOC~~ SOLN: 12.0000 mg | Freq: Every day | SUBCUTANEOUS | 0 refills | Status: DC | PRN

## 2022-05-01 MED ORDER — DEXAMETHASONE 4 MG PO TABS: 8.0000 mg | ORAL_TABLET | Freq: Every day | ORAL | 0 refills | Status: DC

## 2022-05-02 ENCOUNTER — Other Ambulatory Visit: Payer: Self-pay | Admitting: Internal Medicine

## 2022-05-02 NOTE — Telephone Encounter (Signed)
   Notes to clinic: Not sure how this got routed to [facility]- provider not a provider we fill medication for.  Requested Prescriptions  Pending Prescriptions Disp Refills   fentaNYL ([place]) 12 MCG/HR [Pharmacy Med Name: fentanyl 12 mcg/hr transdermal patch] 5 patch 0    Sig: Place 1 patch onto the skin every 3 (three) days. Place patch with 22mcg patch q72 hours for total of 37.5 mcg     There is no refill protocol information for this order       Requested Prescriptions  Pending Prescriptions Disp Refills   fentaNYL ([place]) 12 MCG/HR [Pharmacy Med Name: fentanyl 12 mcg/hr transdermal patch] 5 patch 0    Sig: Place 1 patch onto the skin every 3 (three) days. Place patch with 52mcg patch q72 hours for total of 37.5 mcg     There is no refill protocol information for this order

## 2022-05-03 ENCOUNTER — Telehealth: Payer: Self-pay | Admitting: *Deleted

## 2022-05-06 ENCOUNTER — Telehealth: Payer: Self-pay | Admitting: *Deleted

## 2022-05-06 ENCOUNTER — Other Ambulatory Visit: Payer: Self-pay | Admitting: Internal Medicine

## 2022-05-06 ENCOUNTER — Other Ambulatory Visit: Payer: Self-pay

## 2022-05-06 DIAGNOSIS — C251 Malignant neoplasm of body of pancreas: Secondary | ICD-10-CM

## 2022-05-06 MED ORDER — SODIUM CHLORIDE 0.9 % IV SOLN: Freq: Once | INTRAVENOUS | Status: DC

## 2022-05-06 MED ORDER — FENTANYL 12 MCG/HR TD PT72: 1.0000 | MEDICATED_PATCH | TRANSDERMAL | 0 refills | Status: DC

## 2022-05-06 MED ORDER — FENTANYL 50 MCG/HR TD PT72: 1.0000 | MEDICATED_PATCH | TRANSDERMAL | 0 refills | Status: DC

## 2022-05-06 MED ORDER — MELOXICAM 15 MG PO TABS: 15.0000 mg | ORAL_TABLET | Freq: Every day | ORAL | 0 refills | Status: DC

## 2022-05-06 NOTE — Progress Notes (Deleted)
[facility]  [phone] [phone]) [phone]   Name: JOHONNA RUGEN Date: 05/06/2022 MRN: [id number]  DOB: April 14, 1965  Patient Care Team: Chesley Noon, MD as PCP - General (Family Medicine) Algie Coffer, Mindi Slicker, RN as Oncology Nurse Navigator Benay Spice Izola Price, MD as Consulting Physician (Oncology)    REASON FOR CONSULTATION: REESE CLARKIN is a 57 y.o. 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 Palliative ask to see for symptom management and goals of care.    SOCIAL HISTORY:     reports that she has never smoked. She has never used smokeless tobacco. She reports that she does not currently use alcohol. She reports that she does not use drugs.  ADVANCE DIRECTIVES:  Advanced directives on file  CODE STATUS: {Palliative Code status:23503}  PAST MEDICAL HISTORY: Past Medical History:  Diagnosis Date   Abnormal Pap smear    Anxiety    "situational"   BV (bacterial vaginosis)    Cancer (HCC)    pancreatic   Cervical polyp    Depression    "situational"   Family history of adverse reaction to anesthesia    mother had N/V   Recurrent UTI (urinary tract infection)     PAST SURGICAL HISTORY:  Past Surgical History:  Procedure Laterality Date   BIOPSY  07/16/2021   Procedure: BIOPSY;  Surgeon: Mansouraty, Telford Nab., MD;  Location: [facility];  Service: Gastroenterology;;   CERVICAL CONE BIOPSY  1996   ESOPHAGOGASTRODUODENOSCOPY (EGD) WITH PROPOFOL N/A 07/16/2021   Procedure: ESOPHAGOGASTRODUODENOSCOPY (EGD) WITH PROPOFOL;  Surgeon: Irving Copas., MD;  Location: [facility];  Service: Gastroenterology;  Laterality: N/A;   FINE NEEDLE ASPIRATION  07/16/2021   Procedure: FINE NEEDLE ASPIRATION (FNA) LINEAR;  Surgeon: Irving Copas., MD;  Location: [facility];  Service: Gastroenterology;;   IR RADIOLOGIST EVAL & MGMT  02/26/2022   LASIK     PORTACATH PLACEMENT Right 08/01/2021   Procedure: INSERTION PORT-A-CATH;  Surgeon: Dwan Bolt, MD;  Location: [facility];  Service: General;  Laterality: Right;   UPPER ESOPHAGEAL ENDOSCOPIC ULTRASOUND (EUS) N/A 07/16/2021   Procedure: UPPER ESOPHAGEAL ENDOSCOPIC ULTRASOUND (EUS);  Surgeon: Irving Copas., MD;  Location: [facility];  Service: Gastroenterology;  Laterality: N/A;    HEMATOLOGY/ONCOLOGY HISTORY:  Oncology History  Cancer of pancreas, body ([facility])  07/24/2021 Initial Diagnosis   Cancer of pancreas, body ([facility])   07/24/2021 Cancer Staging   Staging form: Exocrine Pancreas, AJCC 8th Edition - Clinical: Stage III (cT4, cN0, cM0) - Signed by Ladell Pier, MD on 07/24/2021 Total positive nodes: 0   08/08/2021 - 09/21/2021 Chemotherapy   Patient is on Treatment Plan : PANCREAS Modified FOLFIRINOX q14d x 8 cycles     08/08/2021 - 02/08/2022 Chemotherapy   Patient is on Treatment Plan : PANCREAS Modified FOLFIRINOX q14d x 8 cycles     03/06/2022 -  Chemotherapy   Patient is on Treatment Plan : PANCREATIC Abraxane D1,8,15 + Gemcitabine D1,8,15 q28d       ALLERGIES:  has No Known Allergies.  MEDICATIONS:  Current Outpatient Medications  Medication Sig Dispense Refill   apixaban (ELIQUIS) 2.5 MG TABS tablet Take 1 tablet (2.5 mg total) by mouth 2 (two) times daily. 60 tablet 0   bisacodyl (DULCOLAX) 10 MG suppository Place 1 suppository (10 mg total) rectally as needed for moderate constipation. 12 suppository 0  celecoxib (CELEBREX) 200 MG capsule Take 1 capsule (200 mg total) by mouth 2 (two) times daily. 60 capsule 0   dexamethasone (DECADRON) 4 MG tablet Take 2 tablets (8 mg total) by mouth daily. Take one tablet every AM or as directed by provider 60 tablet 0   diazepam (VALIUM) 2 MG tablet Take 0.5 tablets (1 mg total) by mouth every 12 (twelve) hours as needed for anxiety or  muscle spasms. 30 tablet 0   docusate sodium (COLACE) 250 MG capsule Take 1 capsule (250 mg total) by mouth 2 (two) times daily. 60 capsule 0   DULoxetine (CYMBALTA) 30 MG capsule Take 1 capsule (30 mg total) by mouth daily. 30 capsule 0   famotidine (PEPCID) 40 MG tablet Take 40 mg by mouth 2 (two) times daily.     fentaNYL (DURAGESIC) 50 MCG/HR Place 1 patch onto the skin every other day. 15 patch 0   gabapentin (NEURONTIN) 300 MG capsule Take 1 capsule (300 mg total) by mouth 3 (three) times daily. 90 capsule 1   HYDROmorphone (DILAUDID) 4 MG tablet Take 1 tablet (4 mg total) by mouth every 3 (three) hours as needed for severe pain. 90 tablet 0   lidocaine (LIDODERM) 5 % Place 1 patch onto the skin daily. Remove & Discard patch within 12 hours or as directed by MD 30 patch 0   lidocaine-prilocaine (EMLA) cream Apply 1 Application topically as needed. 30 g 2   methylnaltrexone (RELISTOR) 12 MG/0.6ML SOLN injection Inject 0.6 mLs (12 mg total) into the skin daily as needed for up to 4 doses for severe constipation. 2.4 mL 0   modafinil (PROVIGIL) 100 MG tablet Take 1 tablet (100 mg total) by mouth daily with breakfast. 30 tablet 0   Multiple Vitamin (MULTIVITAMIN WITH MINERALS) TABS tablet Take 1 tablet by mouth daily.     Naldemedine Tosylate (SYMPROIC) 0.2 MG TABS Take as directed.     pantoprazole (PROTONIX) 40 MG tablet Take 40 mg by mouth daily before breakfast.     traZODone (DESYREL) 50 MG tablet Take 1 tablet (50 mg total) by mouth at bedtime. 30 tablet 0   No current facility-administered medications for this visit.    VITAL SIGNS: LMP 11/01/2018  There were no vitals filed for this visit.  Estimated body mass index is 14.61 kg/m as calculated from the following:   Height as of 04/15/22: 5\' 10"  (1.778 m).   Weight as of 04/15/22: 101 lb 13.6 oz (46.2 kg).  LABS: CBC:    Component Value Date/Time   WBC 5.3 04/23/2022 0559   HGB 10.7 (L) 04/23/2022 0559   HGB 10.9 (L) 04/04/2022  0956   HGB 13.2 09/08/2019 0946   HCT 33.9 (L) 04/23/2022 0559   HCT 40.5 09/08/2019 0946   PLT 147 (L) 04/23/2022 0559   PLT 131 (L) 04/04/2022 0956   PLT 256 09/08/2019 0946   MCV 100.0 04/23/2022 0559   MCV 97 09/08/2019 0946   NEUTROABS 3.9 04/23/2022 0559   LYMPHSABS 1.0 04/23/2022 0559   MONOABS 0.4 04/23/2022 0559   EOSABS 0.0 04/23/2022 0559   BASOSABS 0.0 04/23/2022 0559   Comprehensive Metabolic Panel:    Component Value Date/Time   NA 133 (L) 04/19/2022 1330   NA 140 09/08/2019 0946   K 3.4 (L) 04/19/2022 1330   CL 98 04/19/2022 1330   CO2 29 04/19/2022 1330   BUN 21 (H) 04/19/2022 1330   BUN 22 09/08/2019 0946   CREATININE 0.36 (L) 04/19/2022  1330   CREATININE 0.47 04/04/2022 0956   GLUCOSE 165 (H) 04/19/2022 1330   CALCIUM 8.7 (L) 04/19/2022 1330   AST 12 (L) 04/19/2022 1330   AST 20 04/04/2022 0956   ALT 13 04/19/2022 1330   ALT 16 04/04/2022 0956   ALKPHOS 66 04/19/2022 1330   BILITOT 0.4 04/19/2022 1330   BILITOT 0.3 04/04/2022 0956   PROT 6.8 04/19/2022 1330   PROT 7.6 09/08/2019 0946   ALBUMIN 3.6 04/19/2022 1330   ALBUMIN 4.7 09/08/2019 0946    RADIOGRAPHIC STUDIES:  CT ABDOMEN PELVIS W CONTRAST  Result Date: 04/15/2022 CLINICAL DATA:  Generalized abdominal pain radiating to the back worsening over the last week. History of pancreatic cancer. * Tracking Code: BO * EXAM: CT ABDOMEN AND PELVIS WITH CONTRAST TECHNIQUE: Multidetector CT imaging of the abdomen and pelvis was performed using the standard protocol following bolus administration of intravenous contrast. RADIATION DOSE REDUCTION: This exam was performed according to the departmental dose-optimization program which includes automated exposure control, adjustment of the mA and/or kV according to patient size and/or use of iterative reconstruction technique. CONTRAST:  75mL OMNIPAQUE IOHEXOL 350 MG/ML SOLN COMPARISON:  Multiple priors including most recent CT March 20, 2022 FINDINGS: Lower chest:  No acute abnormality. Hepatobiliary: No suspicious hepatic lesion. Gallbladder is unremarkable. Biliary tree measures upper limits of normal at 6 mm, unchanged from prior. Pancreas: Heterogeneous lesion in the pancreatic neck/body junction measures 5.7 x 4.2 cm on image 25/2 previously 4.8 x 3.6 cm when remeasured for consistency. Similar atrophy and ductal dilation in the pancreatic tail. Spleen: No splenomegaly. Adrenals/Urinary Tract: Bilateral adrenal glands appear normal. No hydronephrosis. Kidneys demonstrate symmetric enhancement and excretion of contrast material. Urinary bladder is unremarkable for degree of distension. Stomach/Bowel: Similar loss of fat plane between the pancreatic mass in the gastric antral wall with gastric wall thickening in this area for instance on image 28/2. Similar moderate distension of the stomach with gas and fluid. No evidence of bowel obstruction. Moderate volume of formed stool throughout the colon. Vascular/Lymphatic: Normal caliber abdominal aorta. 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 Abutment of the SMA. 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 Reproductive: Uterus and bilateral adnexa are unremarkable. Other: Trace pelvic free fluid. No discrete peritoneal or omental nodularity Musculoskeletal: No aggressive lytic or blastic lesion of bone. IMPRESSION: 1. Increased size of the pancreatic neck/body mass with probable involvement of the gastric wall and similar moderate fluid distension of the stomach. 2. 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 3. Trace pelvic free fluid. No discrete peritoneal or omental nodularity. 4. Moderate volume of formed stool throughout the colon. Correlate for constipation. Electronically Signed   By: Dahlia Bailiff M.D.   On: 04/15/2022 10:22     PERFORMANCE STATUS (ECOG) : {CHL ONC ECOG [id number]  Review of Systems Unless otherwise noted, a complete review of systems is negative.  Physical Exam General: NAD Cardiovascular: regular rate and rhythm Pulmonary: clear ant fields Abdomen: soft, nontender, + bowel sounds GU: no suprapubic tenderness Extremities: no edema, no joint deformities Skin: no rashes Neurological:  IMPRESSION: *** I introduced myself, Alexsia Klindt RN, and Palliative's role in collaboration with the oncology team. Concept of Palliative Care was introduced as specialized medical care for people and their families living with serious illness.  It focuses on providing relief from the symptoms and stress of a serious illness.  The goal  is to improve quality of life for both the patient and the family. Values and goals of care important to patient and family were attempted to be elicited.    We discussed *** current illness and what it means in the larger context of *** on-going co-morbidities. Natural disease trajectory and expectations were discussed.  I discussed the importance of continued conversation with family and their medical providers regarding overall plan of care and treatment options, ensuring decisions are within the context of the patients values and GOCs.  PLAN: Established therapeutic relationship. Education provided on palliative's role in collaboration with their Oncology/Radiation team. I will plan to see patient back in 2-4 weeks in collaboration to other oncology appointments.    Patient expressed understanding and was in agreement with this plan. She also understands that She can call the clinic at any time with any questions, concerns, or complaints.   Thank you for your referral and allowing Palliative to assist in Mrs. Keneshia Livingood Sahm's care.   Number and complexity of problems addressed: ***HIGH - 1 or more chronic illnesses with SEVERE exacerbation, progression, or side effects of  treatment - advanced cancer, pain. Any controlled substances utilized were prescribed in the context of palliative care.  Time Total: ***  Visit consisted of counseling and education dealing with the complex and emotionally intense issues of symptom management and palliative care in the setting of serious and potentially life-threatening illness.Greater than 50%  of this time was spent counseling and coordinating care related to the above assessment and plan.  Signed by: Alda Lea, AGPCNP-BC Palliative Medicine Team/[place]   *Please note that this is a verbal dictation therefore any spelling or grammatical errors are due to the "[employer] [employer]" system interpretation.

## 2022-05-06 NOTE — Telephone Encounter (Signed)
   Requested Prescriptions  Pending Prescriptions Disp Refills   fentaNYL ([place]) 12 MCG/HR [Pharmacy Med Name: fentanyl 12 mcg/hr transdermal patch] 5 patch 0    Sig: Place 1 patch onto the skin every 3 (three) days. Place patch with 61mcg patch q72 hours for total of 37.5 mcg     There is no refill protocol information for this order       Requested Prescriptions  Pending Prescriptions Disp Refills   fentaNYL ([place]) 12 MCG/HR [Pharmacy Med Name: fentanyl 12 mcg/hr transdermal patch] 5 patch 0    Sig: Place 1 patch onto the skin every 3 (three) days. Place patch with 27mcg patch q72 hours for total of 37.5 mcg     There is no refill protocol information for this order

## 2022-05-06 NOTE — Telephone Encounter (Signed)
F/U on hospital d/c of 04/25/22. Left VM for Tara Jensen to call with status.

## 2022-05-06 NOTE — Progress Notes (Unsigned)
[facility]  [phone]) [phone] Fax:(336) [phone]   Name: Tara Jensen Date: 05/06/2022 MRN: [id number]  DOB: 01-Dec-1965  Patient Care Team: Chesley Noon, MD as PCP - General (Family Medicine) Algie Coffer, Mindi Slicker, RN as Oncology Nurse Navigator Benay Spice Izola Price, MD as Consulting Physician (Oncology)    REASON FOR CONSULTATION: Tara Jensen is a 57 y.o. female with oncologic medical history including pancreatic cancer (07/2021) metastatic to the liver and gastric wall, GERD, anxiety, depression, constipation, and chemo induced peripheral neuropathy. Palliative ask to see for symptom and pain management and goals of care.    SOCIAL HISTORY:     reports that she has never smoked. She has never used smokeless tobacco. She reports that she does not currently use alcohol. She reports that she does not use drugs.  ADVANCE DIRECTIVES:  Advanced directives on file  CODE STATUS: DNR  PAST MEDICAL HISTORY: Past Medical History:  Diagnosis Date   Abnormal Pap smear    Anxiety    "situational"   BV (bacterial vaginosis)    Cancer (HCC)    pancreatic   Cervical polyp    Depression    "situational"   Family history of adverse reaction to anesthesia    mother had N/V   Recurrent UTI (urinary tract infection)     PAST SURGICAL HISTORY:  Past Surgical History:  Procedure Laterality Date   BIOPSY  07/16/2021   Procedure: BIOPSY;  Surgeon: Mansouraty, Telford Nab., MD;  Location: [facility];  Service: Gastroenterology;;   CERVICAL CONE BIOPSY  1996   ESOPHAGOGASTRODUODENOSCOPY (EGD) WITH PROPOFOL N/A 07/16/2021   Procedure: ESOPHAGOGASTRODUODENOSCOPY (EGD) WITH PROPOFOL;  Surgeon: Irving Copas., MD;  Location: [facility];  Service: Gastroenterology;  Laterality: N/A;   FINE NEEDLE ASPIRATION  07/16/2021   Procedure: FINE NEEDLE ASPIRATION (FNA) LINEAR;  Surgeon: Irving Copas., MD;  Location: [facility];   Service: Gastroenterology;;   IR RADIOLOGIST EVAL & MGMT  02/26/2022   LASIK     PORTACATH PLACEMENT Right 08/01/2021   Procedure: INSERTION PORT-A-CATH;  Surgeon: Dwan Bolt, MD;  Location: [facility];  Service: General;  Laterality: Right;   UPPER ESOPHAGEAL ENDOSCOPIC ULTRASOUND (EUS) N/A 07/16/2021   Procedure: UPPER ESOPHAGEAL ENDOSCOPIC ULTRASOUND (EUS);  Surgeon: Irving Copas., MD;  Location: [facility];  Service: Gastroenterology;  Laterality: N/A;    HEMATOLOGY/ONCOLOGY HISTORY:  Oncology History  Cancer of pancreas, body ([place])  07/24/2021 Initial Diagnosis   Cancer of pancreas, body ([place])   07/24/2021 Cancer Staging   Staging form: Exocrine Pancreas, AJCC 8th Edition - Clinical: Stage III (cT4, cN0, cM0) - Signed by Ladell Pier, MD on 07/24/2021 Total positive nodes: 0   08/08/2021 - 09/21/2021 Chemotherapy   Patient is on Treatment Plan : PANCREAS Modified FOLFIRINOX q14d x 8 cycles     08/08/2021 - 02/08/2022 Chemotherapy   Patient is on Treatment Plan : PANCREAS Modified FOLFIRINOX q14d x 8 cycles     03/06/2022 -  Chemotherapy   Patient is on Treatment Plan : PANCREATIC Abraxane D1,8,15 + Gemcitabine D1,8,15 q28d       ALLERGIES:  has No Known Allergies.  MEDICATIONS:  Current Outpatient Medications  Medication Sig Dispense Refill   apixaban (ELIQUIS) 2.5 MG TABS tablet Take 1 tablet (2.5 mg total) by mouth 2 (two) times daily. 60 tablet 0   bisacodyl (DULCOLAX) 10 MG suppository Place 1 suppository (10 mg total) rectally as needed for moderate constipation. 12 suppository  0   dexamethasone (DECADRON) 4 MG tablet Take 2 tablets (8 mg total) by mouth daily. Take one tablet every AM or as directed by provider 60 tablet 0   diazepam (VALIUM) 2 MG tablet Take 0.5 tablets (1 mg total) by mouth every 12 (twelve) hours as needed for anxiety or muscle spasms. 30 tablet 0   docusate sodium (COLACE) 250 MG capsule Take 1 capsule (250 mg total) by mouth 2 (two) times  daily. 60 capsule 0   DULoxetine (CYMBALTA) 30 MG capsule Take 1 capsule (30 mg total) by mouth daily. 30 capsule 0   famotidine (PEPCID) 40 MG tablet Take 40 mg by mouth 2 (two) times daily.     fentaNYL (DURAGESIC) 12 MCG/HR Place 1 patch onto the skin every 3 (three) days. APPLY WITH 57mcg patch for total of 62 mcg 15 patch 0   fentaNYL (DURAGESIC) 50 MCG/HR Place 1 patch onto the skin every other day. To be used with 12.5 mcg patch for total of 62.5 mcg every 48h 15 patch 0   gabapentin (NEURONTIN) 300 MG capsule Take 1 capsule (300 mg total) by mouth 3 (three) times daily. 90 capsule 1   HYDROmorphone (DILAUDID) 4 MG tablet Take 1 tablet (4 mg total) by mouth every 3 (three) hours as needed for severe pain. 90 tablet 0   lidocaine (LIDODERM) 5 % Place 1 patch onto the skin daily. Remove & Discard patch within 12 hours or as directed by MD 30 patch 0   lidocaine-prilocaine (EMLA) cream Apply 1 Application topically as needed. 30 g 2   meloxicam (MOBIC) 15 MG tablet Take 1 tablet (15 mg total) by mouth daily. 30 tablet 0   methylnaltrexone (RELISTOR) 12 MG/0.6ML SOLN injection Inject 0.6 mLs (12 mg total) into the skin daily as needed for up to 4 doses for severe constipation. 2.4 mL 0   modafinil (PROVIGIL) 100 MG tablet Take 1 tablet (100 mg total) by mouth daily with breakfast. 30 tablet 0   Multiple Vitamin (MULTIVITAMIN WITH MINERALS) TABS tablet Take 1 tablet by mouth daily.     Naldemedine Tosylate (SYMPROIC) 0.2 MG TABS Take as directed.     pantoprazole (PROTONIX) 40 MG tablet Take 40 mg by mouth daily before breakfast.     traZODone (DESYREL) 50 MG tablet Take 1 tablet (50 mg total) by mouth at bedtime. 30 tablet 0   No current facility-administered medications for this visit.    VITAL SIGNS: LMP 11/01/2018  There were no vitals filed for this visit.  Estimated body mass index is 14.61 kg/m as calculated from the following:   Height as of 04/15/22: 5\' 10"  (1.778 m).   Weight as  of 04/15/22: 101 lb 13.6 oz (46.2 kg).  LABS: CBC:    Component Value Date/Time   WBC 5.3 04/23/2022 0559   HGB 10.7 (L) 04/23/2022 0559   HGB 10.9 (L) 04/04/2022 0956   HGB 13.2 09/08/2019 0946   HCT 33.9 (L) 04/23/2022 0559   HCT 40.5 09/08/2019 0946   PLT 147 (L) 04/23/2022 0559   PLT 131 (L) 04/04/2022 0956   PLT 256 09/08/2019 0946   MCV 100.0 04/23/2022 0559   MCV 97 09/08/2019 0946   NEUTROABS 3.9 04/23/2022 0559   LYMPHSABS 1.0 04/23/2022 0559   MONOABS 0.4 04/23/2022 0559   EOSABS 0.0 04/23/2022 0559   BASOSABS 0.0 04/23/2022 0559   Comprehensive Metabolic Panel:    Component Value Date/Time   NA 133 (L) 04/19/2022 1330  NA 140 09/08/2019 0946   K 3.4 (L) 04/19/2022 1330   CL 98 04/19/2022 1330   CO2 29 04/19/2022 1330   BUN 21 (H) 04/19/2022 1330   BUN 22 09/08/2019 0946   CREATININE 0.36 (L) 04/19/2022 1330   CREATININE 0.47 04/04/2022 0956   GLUCOSE 165 (H) 04/19/2022 1330   CALCIUM 8.7 (L) 04/19/2022 1330   AST 12 (L) 04/19/2022 1330   AST 20 04/04/2022 0956   ALT 13 04/19/2022 1330   ALT 16 04/04/2022 0956   ALKPHOS 66 04/19/2022 1330   BILITOT 0.4 04/19/2022 1330   BILITOT 0.3 04/04/2022 0956   PROT 6.8 04/19/2022 1330   PROT 7.6 09/08/2019 0946   ALBUMIN 3.6 04/19/2022 1330   ALBUMIN 4.7 09/08/2019 0946    RADIOGRAPHIC STUDIES:  CT ABDOMEN PELVIS W CONTRAST  Result Date: 04/15/2022 CLINICAL DATA:  Generalized abdominal pain radiating to the back worsening over the last week. History of pancreatic cancer. * Tracking Code: BO * EXAM: CT ABDOMEN AND PELVIS WITH CONTRAST TECHNIQUE: Multidetector CT imaging of the abdomen and pelvis was performed using the standard protocol following bolus administration of intravenous contrast. RADIATION DOSE REDUCTION: This exam was performed according to the departmental dose-optimization program which includes automated exposure control, adjustment of the mA and/or kV according to patient size and/or use of iterative  reconstruction technique. CONTRAST:  30mL OMNIPAQUE IOHEXOL 350 MG/ML SOLN COMPARISON:  Multiple priors including most recent CT March 20, 2022 FINDINGS: Lower chest: No acute abnormality. Hepatobiliary: No suspicious hepatic lesion. Gallbladder is unremarkable. Biliary tree measures upper limits of normal at 6 mm, unchanged from prior. Pancreas: Heterogeneous lesion in the pancreatic neck/body junction measures 5.7 x 4.2 cm on image 25/2 previously 4.8 x 3.6 cm when remeasured for consistency. Similar atrophy and ductal dilation in the pancreatic tail. Spleen: No splenomegaly. Adrenals/Urinary Tract: Bilateral adrenal glands appear normal. No hydronephrosis. Kidneys demonstrate symmetric enhancement and excretion of contrast material. Urinary bladder is unremarkable for degree of distension. Stomach/Bowel: Similar loss of fat plane between the pancreatic mass in the gastric antral wall with gastric wall thickening in this area for instance on image 28/2. Similar moderate distension of the stomach with gas and fluid. No evidence of bowel obstruction. Moderate volume of formed stool throughout the colon. Vascular/Lymphatic: Normal caliber abdominal aorta. 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 Abutment of the SMA. 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 Reproductive: Uterus and bilateral adnexa are unremarkable. Other: Trace pelvic free fluid. No discrete peritoneal or omental nodularity Musculoskeletal: No aggressive lytic or blastic lesion of bone. IMPRESSION: 1. Increased size of the pancreatic neck/body mass with probable involvement of the gastric wall and similar moderate fluid distension of the stomach. 2. 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 3. Trace pelvic free fluid. No discrete peritoneal or omental  nodularity. 4. Moderate volume of formed stool throughout the colon. Correlate for constipation. Electronically Signed   By: Dahlia Bailiff M.D.   On: 04/15/2022 10:22    PERFORMANCE STATUS (ECOG) : {CHL ONC ECOG [id number]  Review of Systems Unless otherwise noted, a complete review of systems is negative.  Physical Exam General: NAD Cardiovascular: regular rate and rhythm Pulmonary: clear ant fields Abdomen: soft, nontender, + bowel sounds GU: no suprapubic tenderness Extremities: no edema, no joint deformities Skin: no rashes Neurological:  IMPRESSION: *** I introduced myself, [place] RN, and Palliative's role in  collaboration with the oncology team. Concept of Palliative Care was introduced as specialized medical care for people and their families living with serious illness.  It focuses on providing relief from the symptoms and stress of a serious illness.  The goal is to improve quality of life for both the patient and the family. Values and goals of care important to patient and family were attempted to be elicited.    We discussed *** current illness and what it means in the larger context of *** on-going co-morbidities. Natural disease trajectory and expectations were discussed.  I discussed the importance of continued conversation with family and their medical providers regarding overall plan of care and treatment options, ensuring decisions are within the context of the patients values and GOCs.  PLAN: Established therapeutic relationship. Education provided on palliative's role in collaboration with their Oncology/Radiation team. I will plan to see patient back in 2-4 weeks in collaboration to other oncology appointments.    Patient expressed understanding and was in agreement with this plan. She also understands that She can call the clinic at any time with any questions, concerns, or complaints.   Thank you for your referral and allowing Palliative to assist in Mrs.  Kamore Sandoe Jensen's care.   Number and complexity of problems addressed: ***HIGH - 1 or more chronic illnesses with SEVERE exacerbation, progression, or side effects of treatment - advanced cancer, pain. Any controlled substances utilized were prescribed in the context of palliative care.  Time Total: ***  Visit consisted of counseling and education dealing with the complex and emotionally intense issues of symptom management and palliative care in the setting of serious and potentially life-threatening illness.Greater than 50%  of this time was spent counseling and coordinating care related to the above assessment and plan.  Signed by: Alda Lea, AGPCNP-BC Palliative Medicine Team/[facility] [place]   *Please note that this is a verbal dictation therefore any spelling or grammatical errors are due to the "[employer]" system interpretation.

## 2022-05-06 NOTE — Progress Notes (Signed)
Per Lexine Baton, NP, pt IVF placed under signed and held for [facility] appt tomorrow

## 2022-05-06 NOTE — Telephone Encounter (Signed)
Sent to pharmacist in error

## 2022-05-07 ENCOUNTER — Inpatient Hospital Stay: Payer: BC Managed Care – PPO | Attending: Oncology | Admitting: Nurse Practitioner

## 2022-05-07 ENCOUNTER — Other Ambulatory Visit: Payer: Self-pay

## 2022-05-07 ENCOUNTER — Inpatient Hospital Stay: Payer: BC Managed Care – PPO

## 2022-05-07 ENCOUNTER — Encounter: Payer: Self-pay | Admitting: Nurse Practitioner

## 2022-05-07 VITALS — BP 93/62 | HR 84 | Temp 98.3°F | Resp 16

## 2022-05-07 DIAGNOSIS — Z5111 Encounter for antineoplastic chemotherapy: Secondary | ICD-10-CM | POA: Diagnosis present

## 2022-05-07 DIAGNOSIS — K5903 Drug induced constipation: Secondary | ICD-10-CM

## 2022-05-07 DIAGNOSIS — Z79899 Other long term (current) drug therapy: Secondary | ICD-10-CM | POA: Insufficient documentation

## 2022-05-07 DIAGNOSIS — Z95828 Presence of other vascular implants and grafts: Secondary | ICD-10-CM

## 2022-05-07 DIAGNOSIS — G893 Neoplasm related pain (acute) (chronic): Secondary | ICD-10-CM | POA: Diagnosis not present

## 2022-05-07 DIAGNOSIS — Z515 Encounter for palliative care: Secondary | ICD-10-CM

## 2022-05-07 DIAGNOSIS — Z7189 Other specified counseling: Secondary | ICD-10-CM

## 2022-05-07 DIAGNOSIS — C251 Malignant neoplasm of body of pancreas: Secondary | ICD-10-CM | POA: Diagnosis not present

## 2022-05-07 DIAGNOSIS — K5900 Constipation, unspecified: Secondary | ICD-10-CM | POA: Insufficient documentation

## 2022-05-07 DIAGNOSIS — C787 Secondary malignant neoplasm of liver and intrahepatic bile duct: Secondary | ICD-10-CM | POA: Diagnosis present

## 2022-05-07 DIAGNOSIS — M792 Neuralgia and neuritis, unspecified: Secondary | ICD-10-CM

## 2022-05-07 DIAGNOSIS — R530 Neoplastic (malignant) related fatigue: Secondary | ICD-10-CM

## 2022-05-07 MED ORDER — SODIUM CHLORIDE 0.9 % IV SOLN
10.0000 mg | Freq: Once | INTRAVENOUS | Status: AC
  Administered 2022-05-07: 10 mg via INTRAVENOUS
  Filled 2022-05-07: qty 10

## 2022-05-07 MED ORDER — KETOROLAC TROMETHAMINE 30 MG/ML IJ SOLN
30.0000 mg | Freq: Once | INTRAMUSCULAR | Status: AC
  Administered 2022-05-07: 30 mg via INTRAVENOUS
  Filled 2022-05-07: qty 1

## 2022-05-07 MED ORDER — SODIUM CHLORIDE 0.9 % IV SOLN: Freq: Once | INTRAVENOUS | Status: AC

## 2022-05-07 MED ORDER — SODIUM CHLORIDE 0.9% FLUSH
10.0000 mL | Freq: Once | INTRAVENOUS | Status: AC
  Administered 2022-05-07: 10 mL via INTRAVENOUS

## 2022-05-07 MED ORDER — HEPARIN SOD (PORK) LOCK FLUSH 100 UNIT/ML IV SOLN
500.0000 [IU] | Freq: Once | INTRAVENOUS | Status: AC
  Administered 2022-05-07: 500 [IU] via INTRAVENOUS

## 2022-05-07 MED ORDER — HYDROMORPHONE HCL 1 MG/ML IJ SOLN
1.0000 mg | Freq: Once | INTRAMUSCULAR | Status: AC
  Administered 2022-05-07: 1 mg via INTRAVENOUS
  Filled 2022-05-07: qty 1

## 2022-05-08 ENCOUNTER — Inpatient Hospital Stay: Payer: BC Managed Care – PPO | Admitting: Nurse Practitioner

## 2022-05-08 ENCOUNTER — Encounter: Payer: Self-pay | Admitting: Oncology

## 2022-05-10 ENCOUNTER — Telehealth: Payer: Self-pay

## 2022-05-10 NOTE — Telephone Encounter (Signed)
CSW attempted to contact patient to discuss her decision about applying for SSDI.  Per the last conversation with patient, she wanted to consult a lawyer first.  Left a vm offering community resources also.

## 2022-05-12 ENCOUNTER — Other Ambulatory Visit: Payer: Self-pay | Admitting: Oncology

## 2022-05-14 ENCOUNTER — Inpatient Hospital Stay: Payer: BC Managed Care – PPO | Attending: Oncology | Admitting: Oncology

## 2022-05-14 VITALS — BP 112/61 | HR 61 | Temp 98.2°F | Resp 18 | Ht 70.0 in | Wt 102.0 lb

## 2022-05-14 DIAGNOSIS — F419 Anxiety disorder, unspecified: Secondary | ICD-10-CM | POA: Diagnosis not present

## 2022-05-14 DIAGNOSIS — Z79899 Other long term (current) drug therapy: Secondary | ICD-10-CM | POA: Insufficient documentation

## 2022-05-14 DIAGNOSIS — Z515 Encounter for palliative care: Secondary | ICD-10-CM | POA: Insufficient documentation

## 2022-05-14 DIAGNOSIS — C787 Secondary malignant neoplasm of liver and intrahepatic bile duct: Secondary | ICD-10-CM | POA: Insufficient documentation

## 2022-05-14 DIAGNOSIS — Z7901 Long term (current) use of anticoagulants: Secondary | ICD-10-CM | POA: Diagnosis not present

## 2022-05-14 DIAGNOSIS — K5900 Constipation, unspecified: Secondary | ICD-10-CM | POA: Insufficient documentation

## 2022-05-14 DIAGNOSIS — Z803 Family history of malignant neoplasm of breast: Secondary | ICD-10-CM | POA: Diagnosis not present

## 2022-05-14 DIAGNOSIS — G893 Neoplasm related pain (acute) (chronic): Secondary | ICD-10-CM | POA: Diagnosis not present

## 2022-05-14 DIAGNOSIS — C251 Malignant neoplasm of body of pancreas: Secondary | ICD-10-CM | POA: Diagnosis not present

## 2022-05-14 DIAGNOSIS — Z7952 Long term (current) use of systemic steroids: Secondary | ICD-10-CM | POA: Diagnosis not present

## 2022-05-14 DIAGNOSIS — K219 Gastro-esophageal reflux disease without esophagitis: Secondary | ICD-10-CM | POA: Diagnosis not present

## 2022-05-14 DIAGNOSIS — F32A Depression, unspecified: Secondary | ICD-10-CM | POA: Insufficient documentation

## 2022-05-14 NOTE — Progress Notes (Signed)
[place] [facility] OFFICE PROGRESS NOTE   Diagnosis: Pancreas cancer  INTERVAL HISTORY:   Tara Jensen was discharged in the hospital 04/25/2022.  She has been followed closely by the palliative care team since discharge from the hospital.  She reports her pain continues to be severe until she was placed on scheduled Dilaudid last week.  She continues gabapentin, Decadron, Cymbalta, and Duragesic.  She has constipation.  She had a bowel movement after a Dulcolax suppository.  She feels much better this week.  She was able to walk in the park today.  Objective:  Vital signs in last 24 hours:  Blood pressure 112/61, pulse 61, temperature 98.2 F (36.8 C), temperature source Oral, resp. rate 18, height 5\' 10"  (1.778 m), weight 102 lb (46.3 kg), last menstrual period 11/01/2018, SpO2 96 %.    Resp: Lungs clear bilaterally Cardio: Regular rate and rhythm GI: No hepatomegaly, soft, firm fullness in the mid upper abdomen Vascular: No leg edema Neuro: Moderate decrease in vibratory sense at the fingertips on the right greater than left hand   Portacath/PICC-without erythema  Lab Results:  Lab Results  Component Value Date   WBC 5.3 04/23/2022   HGB 10.7 (L) 04/23/2022   HCT 33.9 (L) 04/23/2022   MCV 100.0 04/23/2022   PLT 147 (L) 04/23/2022   NEUTROABS 3.9 04/23/2022    CMP  Lab Results  Component Value Date   NA 133 (L) 04/19/2022   K 3.4 (L) 04/19/2022   CL 98 04/19/2022   CO2 29 04/19/2022   GLUCOSE 165 (H) 04/19/2022   BUN 21 (H) 04/19/2022   CREATININE 0.36 (L) 04/19/2022   CALCIUM 8.7 (L) 04/19/2022   PROT 6.8 04/19/2022   ALBUMIN 3.6 04/19/2022   AST 12 (L) 04/19/2022   ALT 13 04/19/2022   ALKPHOS 66 04/19/2022   BILITOT 0.4 04/19/2022   GFRNONAA >60 04/19/2022   GFRAA  12/27/2021    QUESTIONABLE RESULTS, RECOMMEND RECOLLECT TO VERIFY    Lab Results  Component Value Date   CAN199 3,261 (H) 04/21/2022    Lab Results  Component Value Date   INR  1.0 04/16/2022   LABPROT 13.3 04/16/2022    Imaging:  No results found.  Medications: I have reviewed the patient's current medications.   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  Less than 180 degree abutment of the distal celiac, common hepatic, and splenic arteries, less than 180 degree abutment of the SMA CT chest 07/18/2021-no evidence of metastatic disease EUS 07/16/2021-extrinsic impression on the stomach at the posterior wall of the gastric body, no gross lesion in the duodenum, 45 x 36 mm pancreas body mass, abutment of the SMA, celiac trunk, and compression of the splenoportal confluence.  No malignant appearing lymph nodes, numerous venous collaterals adjacent to the portal vein, FNA biopsy-malignant cells consistent with adenocarcinoma, T4N0 by EUS Markedly elevated CA 19-9 Guardant360 07/31/2021-K-ras G12R, MSI high-not detected, ATM VUS Cycle 1 FOLFOX 08/08/2021 Cycle 2 FOLFIRINOX 08/22/2021 CT abdomen/pelvis at [facility] 08/31/2021-hypoattenuating liver lesions consistent with metastases, mass in the pancreas neck/body with greater than 100 degree encasement of the celiac axis and common hepatic artery.  Less than 180 degree abutment of the SMA, long segment occlusion of the portal splenic confluence, splenic vein occluded, multiple collateral vessels in the upper abdomen, prominent left periaortic node Cycle  3 FOLFIRINOX 09/05/2021 Cycle 4 FOLFIRINOX 09/19/2021 Cycle 5 FOLFIRINOX 10/03/2021 CTs 10/16/2021-decrease size of primary pancreas mass, hepatic metastases, and a suspicious retroperitoneal node, resolution of left supraclavicular/low jugular adenopathy compared to a CT chest 07/18/2021.  No evidence of disease progression. Cycle 6 FOLFIRINOX 10/17/2021 Cycle 7 FOLFIRINOX  10/31/2021 Cycle 8 FOLFIRINOX 11/14/2021 Cycle 9 FOLFIRINOX 11/28/2021 Cycle 10 FOLFIRINOX 12/12/2021, oxaliplatin held secondary to neuropathy symptoms CTs 12/20/2021-decrease size of pancreas primary, no evidence of hepatic metastases, no new or progressive disease Cycle 11 FOLFIRINOX 12/27/2021, oxaliplatin held due to neuropathy symptoms Cycle 12 FOLFIRINOX 01/09/2022, oxaliplatin held 01/22/2022-C19-9 higher, 530 Cycle 13 FOLFIRINOX 01/24/2022, oxaliplatin resumed Cycle 14 FOLFIRINOX 02/06/2022 CTs 02/23/2022-increased size of the pancreas body mass new focal gastric wall thickening and loss of fat plane between the mass and the proximal gastric wall, no evidence of new or progressive metastatic disease Cycle 1 gemcitabine/Abraxane 03/06/2022 Cycle 2 gemcitabine/Abraxane 03/20/2022 CT abdomen/pelvis was 03/20/2022-slight interval increase in size of the pancreas neck/body mass with extension to the posterior gastric antrum Cycle 3 gemcitabine/Abraxane 04/04/2022-dose reduction secondary to thrombocytopenia CT abdomen/pelvis 04/14/2021-increase size of the pancreas neck/body mass with probable involvement of the gastric wall, progressive encasement of the celiac artery with chronic involvement of the portal splenic confluence Pain secondary to #1-Dilaudid drip started 04/18/2022, converted to a fentanyl drip followed by a fentanyl patch and oral Dilaudid for breakthrough pain Celiac plexus block 03/11/2022 Celiac plexus block 04/22/2022 Weight loss secondary to #1 History of situational depression Family history of breast cancer 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     Disposition: Tara Jensen has a history of locally advanced/metastatic pancreas cancer.  She was  last treated with gemcitabine/Abraxane on 04/04/2022.  The CA 19-9 was higher and a CT abdomen/pelvis earlier this month revealed an increase in the pancreas mass.  She is now followed by palliative medicine.  Her pain is is now under adequate control with the current multi agent pain regimen.  She will continue the current pain regimen and follow-up with palliative care medicine.  We discussed salvage treatment options for the pancreas cancer including standard chemotherapy regimens (5-FU/liposomal irinotecan, gemcitabine/cisplatin), palliative radiation/Xeloda, and targeted therapy.  There was no apparent target on the Guardant360 testing from June 2023.  We will confer with Dr. Dennison Nancy prior to submitting peripheral blood for repeat NGS testing.  We also discussed clinical trials.  She will return for an office visit after the molecular testing is completed.  She we will schedule a telehealth visit with Dr. Dennison Nancy.  Betsy Coder, MD  05/14/2022  3:55 PM

## 2022-05-14 NOTE — Progress Notes (Signed)
[facility] Report for Disability Eligibility Review completed and faxed with confirmation number.  Patient also to pick up at [facility] clinic per cover sheet.   Fax no. [phone]  05/16/22-Corrections made on papers per patient's request.  Original papers sent by the patient had two different years-2023 and 2024 of May for start of disability.  Corrections made and sent to [facility] for pick up.  Corrected papers sent to medical records to be scanned into the patient's chart.

## 2022-05-15 ENCOUNTER — Inpatient Hospital Stay (HOSPITAL_BASED_OUTPATIENT_CLINIC_OR_DEPARTMENT_OTHER): Payer: BC Managed Care – PPO | Admitting: Nurse Practitioner

## 2022-05-15 ENCOUNTER — Inpatient Hospital Stay: Payer: BC Managed Care – PPO

## 2022-05-15 ENCOUNTER — Other Ambulatory Visit: Payer: Self-pay | Admitting: Internal Medicine

## 2022-05-15 ENCOUNTER — Encounter: Payer: Self-pay | Admitting: Nurse Practitioner

## 2022-05-15 ENCOUNTER — Other Ambulatory Visit: Payer: Self-pay

## 2022-05-15 VITALS — BP 97/65 | HR 65 | Temp 99.1°F | Resp 17 | Wt 103.3 lb

## 2022-05-15 DIAGNOSIS — K5903 Drug induced constipation: Secondary | ICD-10-CM

## 2022-05-15 DIAGNOSIS — C251 Malignant neoplasm of body of pancreas: Secondary | ICD-10-CM

## 2022-05-15 DIAGNOSIS — R630 Anorexia: Secondary | ICD-10-CM | POA: Diagnosis not present

## 2022-05-15 DIAGNOSIS — Z515 Encounter for palliative care: Secondary | ICD-10-CM

## 2022-05-15 DIAGNOSIS — R530 Neoplastic (malignant) related fatigue: Secondary | ICD-10-CM

## 2022-05-15 DIAGNOSIS — G893 Neoplasm related pain (acute) (chronic): Secondary | ICD-10-CM

## 2022-05-15 DIAGNOSIS — Z7189 Other specified counseling: Secondary | ICD-10-CM

## 2022-05-15 DIAGNOSIS — R52 Pain, unspecified: Secondary | ICD-10-CM

## 2022-05-15 LAB — CMP (CANCER CENTER ONLY)
ALT: 16 U/L (ref 0–44)
AST: 10 U/L — ABNORMAL LOW (ref 15–41)
Albumin: 3.6 g/dL (ref 3.5–5.0)
Alkaline Phosphatase: 59 U/L (ref 38–126)
Anion gap: 3 — ABNORMAL LOW (ref 5–15)
BUN: 24 mg/dL — ABNORMAL HIGH (ref 6–20)
CO2: 31 mmol/L (ref 22–32)
Calcium: 9.1 mg/dL (ref 8.9–10.3)
Chloride: 104 mmol/L (ref 98–111)
Creatinine: 0.51 mg/dL (ref 0.44–1.00)
GFR, Estimated: >60 mL/min (ref >60)
Glucose, Bld: 103 mg/dL — ABNORMAL HIGH (ref 70–99)
Potassium: 3.8 mmol/L (ref 3.5–5.1)
Sodium: 138 mmol/L (ref 135–145)
Total Bilirubin: 0.3 mg/dL (ref 0.3–1.2)
Total Protein: 6.5 g/dL (ref 6.5–8.1)

## 2022-05-15 LAB — CBC WITH DIFFERENTIAL (CANCER CENTER ONLY)
Abs Immature Granulocytes: 0.04 10*3/uL (ref 0.00–0.07)
Basophils Absolute: 0 10*3/uL (ref 0.0–0.1)
Basophils Relative: 0 %
Eosinophils Absolute: 0.3 10*3/uL (ref 0.0–0.5)
Eosinophils Relative: 3 %
HCT: 34.5 % — ABNORMAL LOW (ref 36.0–46.0)
Hemoglobin: 11 g/dL — ABNORMAL LOW (ref 12.0–15.0)
Immature Granulocytes: 1 %
Lymphocytes Relative: 14 %
Lymphs Abs: 1.3 10*3/uL (ref 0.7–4.0)
MCH: 31.6 pg (ref 26.0–34.0)
MCHC: 31.9 g/dL (ref 30.0–36.0)
MCV: 99.1 fL (ref 80.0–100.0)
Monocytes Absolute: 0.4 10*3/uL (ref 0.1–1.0)
Monocytes Relative: 5 %
Neutro Abs: 6.8 10*3/uL (ref 1.7–7.7)
Neutrophils Relative %: 77 %
Platelet Count: 141 10*3/uL — ABNORMAL LOW (ref 150–400)
RBC: 3.48 MIL/uL — ABNORMAL LOW (ref 3.87–5.11)
RDW: 14.1 % (ref 11.5–15.5)
WBC Count: 8.9 10*3/uL (ref 4.0–10.5)
nRBC: 0 % (ref 0.0–0.2)

## 2022-05-15 MED ORDER — MODAFINIL 200 MG PO TABS: 200.0000 mg | ORAL_TABLET | Freq: Every day | ORAL | 1 refills | Status: DC

## 2022-05-15 NOTE — Progress Notes (Signed)
Lab orders placed per Lexine Baton, NP

## 2022-05-15 NOTE — Progress Notes (Signed)
[place]  [phone] [phone]   Name: Tara Jensen Date: 05/15/2022 MRN: [id number]  DOB: [birth year]  Patient Care Team: Chesley Noon, MD as PCP - General (Family Medicine) Algie Coffer, Mindi Slicker, RN as Oncology Nurse Navigator Benay Spice Izola Price, MD as Consulting Physician (Oncology)    INTERVAL HISTORY: Tara Jensen is a 57 y.o. female with oncologic medical history including pancreatic cancer (07/2021) metastatic to the liver and gastric wall, GERD, anxiety, depression, constipation, and chemo induced peripheral neuropathy. Palliative ask to see for symptom and pain management and goals of care.     SOCIAL HISTORY:     reports that she has never smoked. She has never used smokeless tobacco. She reports that she does not currently use alcohol. She reports that she does not use drugs.  ADVANCE DIRECTIVES:  On file  CODE STATUS: DNR  PAST MEDICAL HISTORY: Past Medical History:  Diagnosis Date   Abnormal Pap smear    Anxiety    "situational"   BV (bacterial vaginosis)    Cancer ([place])    pancreatic   Cervical polyp    Depression    "situational"   Family history of adverse reaction to anesthesia    mother had N/V   Recurrent UTI (urinary tract infection)     ALLERGIES:  has No Known Allergies.  MEDICATIONS:  Current Outpatient Medications  Medication Sig Dispense Refill   apixaban (ELIQUIS) 2.5 MG TABS tablet Take 1 tablet (2.5 mg total) by mouth 2 (two) times daily. 60 tablet 0   bisacodyl (DULCOLAX) 10 MG suppository Place 1 suppository (10 mg total) rectally as needed for moderate constipation. 12 suppository 0   dexamethasone (DECADRON) 4 MG tablet Take 2 tablets (8 mg total) by mouth daily. Take one tablet every AM or as directed by provider 60 tablet 0   diazepam (VALIUM) 2 MG tablet Take 0.5 tablets (1 mg total) by mouth every 12 (twelve) hours as needed for anxiety or muscle spasms. (Patient  not taking: Reported on 05/14/2022) 30 tablet 0   docusate sodium (COLACE) 250 MG capsule Take 1 capsule (250 mg total) by mouth 2 (two) times daily. 60 capsule 0   DULoxetine (CYMBALTA) 30 MG capsule Take 1 capsule (30 mg total) by mouth daily. 30 capsule 0   famotidine (PEPCID) 40 MG tablet Take 40 mg by mouth 2 (two) times daily.     fentaNYL (DURAGESIC) 12 MCG/HR Place 1 patch onto the skin every other day. APPLY WITH 34mcg patch for total of 62 mcg 15 patch 0   fentaNYL (DURAGESIC) 50 MCG/HR Place 1 patch onto the skin every other day. To be used with 12.5 mcg patch for total of 62.5 mcg every 48h 15 patch 0   gabapentin (NEURONTIN) 300 MG capsule Take 1 capsule (300 mg total) by mouth 3 (three) times daily. 90 capsule 1   GNP MAGNESIUM OXIDE PO Take 400 mg by mouth daily. Gummie OTC--takes 800 mg/day     HYDROmorphone (DILAUDID) 4 MG tablet Take 1 tablet (4 mg total) by mouth every 3 (three) hours as needed for severe pain. 90 tablet 0   lidocaine (LIDODERM) 5 % Place 1 patch onto the skin daily. Remove & Discard patch within 12 hours or as directed by MD 30 patch 0   lidocaine-prilocaine (EMLA) cream Apply 1 Application topically as needed. 30 g 2   meloxicam (MOBIC) 15 MG tablet Take 1 tablet (15 mg total) by  mouth daily. 30 tablet 0   methylnaltrexone (RELISTOR) 12 MG/0.6ML SOLN injection Inject 0.6 mLs (12 mg total) into the skin daily as needed for up to 4 doses for severe constipation. (Patient not taking: Reported on 05/14/2022) 2.4 mL 0   modafinil (PROVIGIL) 100 MG tablet Take 1 tablet (100 mg total) by mouth daily with breakfast. (Patient taking differently: Take 200 mg by mouth daily with breakfast.) 30 tablet 0   Naldemedine Tosylate (SYMPROIC) 0.2 MG TABS Take as directed.     OVER THE COUNTER MEDICATION Take 2 tablets by mouth daily at 6 (six) AM. Superbeet gummies     pantoprazole (PROTONIX) 40 MG tablet Take 40 mg by mouth daily before breakfast.     traZODone (DESYREL) 50 MG  tablet Take 1 tablet (50 mg total) by mouth at bedtime. 30 tablet 0   No current facility-administered medications for this visit.    VITAL SIGNS: LMP 11/01/2018  There were no vitals filed for this visit.  Estimated body mass index is 14.64 kg/m as calculated from the following:   Height as of 05/14/22: 5\' 10"  (1.778 m).   Weight as of 05/14/22: 102 lb (46.3 kg).   PERFORMANCE STATUS (ECOG) : 1 - Symptomatic but completely ambulatory  CBC    Component Value Date/Time   WBC 8.9 05/15/2022 1230   WBC 5.3 04/23/2022 0559   RBC 3.48 (L) 05/15/2022 1230   HGB 11.0 (L) 05/15/2022 1230   HGB 13.2 09/08/2019 0946   HCT 34.5 (L) 05/15/2022 1230   HCT 40.5 09/08/2019 0946   PLT 141 (L) 05/15/2022 1230   PLT 256 09/08/2019 0946   MCV 99.1 05/15/2022 1230   MCV 97 09/08/2019 0946   MCH 31.6 05/15/2022 1230   MCHC 31.9 05/15/2022 1230   RDW 14.1 05/15/2022 1230   RDW 12.1 09/08/2019 0946   LYMPHSABS 1.3 05/15/2022 1230   MONOABS 0.4 05/15/2022 1230   EOSABS 0.3 05/15/2022 1230   BASOSABS 0.0 05/15/2022 1230      Latest Ref Rng & Units 05/15/2022   12:30 PM 04/19/2022    1:30 PM 04/16/2022   11:02 AM  CMP  Glucose 70 - 99 mg/dL 103  165  138   BUN 6 - 20 mg/dL 24  21  15    Creatinine 0.44 - 1.00 mg/dL 0.51  0.36  0.43   Sodium 135 - 145 mmol/L 138  133  133   Potassium 3.5 - 5.1 mmol/L 3.8  3.4  3.9   Chloride 98 - 111 mmol/L 104  98  102   CO2 22 - 32 mmol/L 31  29  28    Calcium 8.9 - 10.3 mg/dL 9.1  8.7  8.8   Total Protein 6.5 - 8.1 g/dL 6.5  6.8  7.2   Total Bilirubin 0.3 - 1.2 mg/dL 0.3  0.4  0.5   Alkaline Phos 38 - 126 U/L 59  66  64   AST 15 - 41 U/L 10  12  15    ALT 0 - 44 U/L 16  13  14       Physical Exam General: NAD, thin  Cardiovascular: regular rate and rhythm Pulmonary: clear ant fields Abdomen: soft, nontender, + bowel sounds Extremities: no edema, no joint deformities Skin: no rashes Neurological: AAO x4   IMPRESSION: Tara Jensen presents to clinic today  for symptom management follow-up. No acute distress noted. Tara Jensen family is present. Patient is ambulatory. Much improved compared to previous visit. Patient has walked in  the park over the past 2 days. Denies nausea, vomiting, or diarrhea. She is much appreciative of Tara Jensen quality of life over the past week. Dr. Hilma Favors also present during visit.   We discussed life stressor's that can contribute to symptoms. Created space and opportunity for Tara Jensen to express feelings and thoughts. Tara Jensen son's 26th birthday is this Sunday. She is appropriately grieving and able to express plans to celebrate in his memory. Emotional support provided.   Neoplasm related pain  [place] reports Tara Jensen pain is well controlled on current regimen. She denies pain at this time due to regimen.    We discussed at length Tara Jensen current regimen: Fentanyl 62 mcg patch changing every 48 hours, hydromorphone 4 mg every 3 hours as needed for breakthrough pain, gabapentin 300 mg 2 times daily 600mg  at bedtime, Cymbalta 30 mg daily, dexamethasone 8 mg daily, mobic 15mg , and lidocaine patch.   We will continue to closely monitor and adjust as needed.    Constipation Patient is taking Symproic 0.2mg  daily. Some improvement in Tara Jensen constipation however continues to be challenge. Can consider increasing to 0.4mg  if needed. Reports best relief with dulcolax suppository.    Goals of care  Tara Jensen was seen by Dr. Benay Spice on yesterday. She shares plan is to have virtual meeting with Dr. Fermin Schwab today also as discussed on yesterday repeat Guardant 360 to re-evaluate for targeted mutations. She continues to express that she is not ready to accept hospice and is remaining hopeful for further therapy options. Even more hopeful given significant improvement in Tara Jensen quality of life.   05/07/22- We discussed Tara Jensen current illness and what it means in the larger context of Tara Jensen on-going co-morbidities. Natural disease trajectory and expectations were discussed.  Patient and family are realistic in their understanding. Goodness speaks to hospice and end-of-life however she is not ready to make this transition or give up on possible treatment options. She and family are planning to connect with Dr. Marianna Payment @ [facility], follow-up with Dr. Benay Spice as needed, and possibly consider other academic institutions that may be able to offer clinical trials or options.   We discussed Tara Jensen current illness and what it means in the larger context of Tara Jensen on-going co-morbidities. Natural disease trajectory and expectations were discussed.  I discussed the importance of continued conversation with family and their medical providers regarding overall plan of care and treatment options, ensuring decisions are within the context of the patients values and GOCs.  PLAN:  Extensive goals of care discussions.  Advanced directives on file.  Ongoing support.  Hydromorphone 4-8 mg every 4 hours as needed for breakthrough pain.   Lidocaine patch Cymbalta 30 mg daily Valium 1 mg every 12 hours as needed Dexamethasone 8 mg daily Fentanyl 62 mcg patch every 48 hours Mobic 15 mg daily Gabapentin 300 mg twice daily and 600 mg at bedtime Provigil 200mg  daily  I will plan to see patient back in 3-4 weeks in collaboration to other oncology appointments.  Patient knows to contact office sooner if needed.   Patient expressed understanding and was in agreement with this plan. She also understands that She can call the clinic at any time with any questions, concerns, or complaints.   Any controlled substances utilized were prescribed in the context of palliative care. PDMP has been reviewed.    Visit consisted of counseling and education dealing with the complex and emotionally intense issues of symptom management and palliative care in the setting of serious and potentially life-threatening illness.Greater  than 50%  of this time was spent counseling and coordinating care related to the above  assessment and plan.  Alda Lea, AGPCNP-BC  Palliative Medicine Team/[place]  *Please note that this is a verbal dictation therefore any spelling or grammatical errors are due to the "[employer]" system interpretation.

## 2022-05-17 ENCOUNTER — Telehealth: Payer: Self-pay | Admitting: Radiation Oncology

## 2022-05-17 NOTE — Telephone Encounter (Signed)
LVM to schedule CON next available with Dr. Mitzi Hansen

## 2022-05-20 NOTE — Progress Notes (Signed)
Radiation Oncology         [phone] ________________________________  Name: Tara Jensen        MRN: [id number]  Date of Service: 05/23/2022 DOB: 01-Jun-1965  NW:GNFAOZ, Tara Memos, MD  Tara Artist, MD     REFERRING PHYSICIAN: [name], MD   DIAGNOSIS: The primary encounter diagnosis was Cancer of pancreas, body. A diagnosis of Abnormal CT scan, stomach was also pertinent to this visit.   HISTORY OF PRESENT ILLNESS: Tara Jensen is a 57 y.o. female seen at the request of Dr. Truett Perna for diagnosis of progressive pancreatic cancer.  The patient was originally diagnosed in the summer 2023 when she presented with imaging indicating a mass in the body of the pancreas including this proximal splenic vein SMV and distal small filling defect of the SMV concerning for thrombus.  She had a periaortic lymph node and abutment of her distal celiac common hepatic and splenic arteries as well as SMA involvement.  By endoscopic ultrasound on 07/16/2021 and the mass measured 4.5 cm in the pancreatic body abutting the SMA, celiac trunk and compressing the splenoportal confluence no abnormal appearing nodes were noted and FNA showed malignant cells consistent with adenocarcinoma.  She began systemic chemotherapy in June 2023.Her regimen was switched from FOLFOX to FOLFIRINOX subsequently and she has received 14 cycles of this the most recent of that regimen being in December 2023 however there was progression in the mass and involving changes to the gastric wall.  She was switched to Gemzar Abraxane in January 2024 and interval imaging in February with CT abdomen pelvis on 03/20/2022 showed a slight interval increase in the mass with extension to the posterior gastric antrum.  Dose reduction in her Gemzar Abraxane was made due to thrombocytopenia and a repeat image on 04/15/2022 showed an increase again in the size of the mass now measuring 5.7 cm by CT.  It is favored to involve the gastric wall, and  progressive encasement of the celiac artery and portal splenic confluence was noted.  She was admitted that day due to intractable pain despite long and short acting narcotics.  She has been offered an additional celiac plexus block which was performed also in January 2024 but she did not have improvement from this.   While she had been admitted, she struggled with pain management. She was able to have a repeat celiac block on 04/22/22 which showed some improvement and she has been able to work with Dr. Phillips Odor and palliative care to have much better control of her pain.  Her get back to some baseline function. She's seen again to consider either palliative radiation or possible chemoradiation now that she's feeling better.  Of note she did have a remote visit with Dr. Johny Blamer at [facility], she did not have any opportunities opening in the near future for clinical trials for the patient, the patient is still considering options of repeating Guardant360 testing.   PREVIOUS RADIATION THERAPY: No   PAST MEDICAL HISTORY:  Past Medical History:  Diagnosis Date   Abnormal Pap smear    Anxiety    "situational"   BV (bacterial vaginosis)    Cancer    pancreatic   Cervical polyp    Depression    "situational"   Family history of adverse reaction to anesthesia    mother had N/V   Recurrent UTI (urinary tract infection)        PAST SURGICAL HISTORY: Past Surgical History:  Procedure Laterality Date  BIOPSY  07/16/2021   Procedure: BIOPSY;  Surgeon: Tara Jensen., MD;  Location: [facility] ENDOSCOPY;  Service: Gastroenterology;;   CERVICAL CONE BIOPSY  1996   ESOPHAGOGASTRODUODENOSCOPY (EGD) WITH PROPOFOL N/A 07/16/2021   Procedure: ESOPHAGOGASTRODUODENOSCOPY (EGD) WITH PROPOFOL;  Surgeon: Tara Jensen., MD;  Location: [facility] ENDOSCOPY;  Service: Gastroenterology;  Laterality: N/A;   FINE NEEDLE ASPIRATION  07/16/2021   Procedure: FINE NEEDLE ASPIRATION (FNA) LINEAR;  Surgeon: Tara Jensen., MD;  Location: [facility] ENDOSCOPY;  Service: Gastroenterology;;   IR RADIOLOGIST EVAL & MGMT  02/26/2022   LASIK     PORTACATH PLACEMENT Right 08/01/2021   Procedure: INSERTION PORT-A-CATH;  Surgeon: Tara Mandes, MD;  Location: [facility] OR;  Service: General;  Laterality: Right;   UPPER ESOPHAGEAL ENDOSCOPIC ULTRASOUND (EUS) N/A 07/16/2021   Procedure: UPPER ESOPHAGEAL ENDOSCOPIC ULTRASOUND (EUS);  Surgeon: Tara Jensen., MD;  Location[facility] ENDOSCOPY;  Service: Gastroenterology;  Laterality: N/A;     FAMILY HISTORY:  Family History  Problem Relation Age of Onset   Depression Mother    Depression Brother    Stroke Maternal Grandmother    Hypertension Maternal Grandfather    Breast cancer Cousin 37       maternal first cousin   Colon cancer Neg Hx    Esophageal cancer Neg Hx    Rectal cancer Neg Hx    Stomach cancer Neg Hx      SOCIAL HISTORY:  reports that she has never smoked. She has never used smokeless tobacco. She reports that she does not currently use alcohol. She reports that she does not use drugs.The patient is single and lives in [place]. She has two HCPOAs, Erskine Squibb a friend, and Thurston Hole, and Celine Ahr, and close friend Dr. Lottie Rater who help her with medical decision making.    ALLERGIES: Patient has no known allergies.   MEDICATIONS:  Current Outpatient Medications  Medication Sig Dispense Refill   apixaban (ELIQUIS) 2.5 MG TABS tablet Take 1 tablet (2.5 mg total) by mouth 2 (two) times daily. 60 tablet 0   bisacodyl (DULCOLAX) 10 MG suppository Place 1 suppository (10 mg total) rectally as needed for moderate constipation. 12 suppository 0   dexamethasone (DECADRON) 4 MG tablet Take 2 tablets (8 mg total) by mouth daily. Take one tablet every AM or as directed by provider 60 tablet 0   docusate sodium (COLACE) 250 MG capsule Take 1 capsule (250 mg total) by mouth 2 (two) times daily. 60 capsule 0   DULoxetine (CYMBALTA) 30 MG capsule Take 1 capsule (30 mg total) by mouth  daily. 30 capsule 0   famotidine (PEPCID) 40 MG tablet Take 40 mg by mouth 2 (two) times daily.     fentaNYL (DURAGESIC) 12 MCG/HR Place 1 patch onto the skin every other day. APPLY WITH patch for total of 62 mcg 15 patch 0   fentaNYL (DURAGESIC) 50 MCG/HR Place 1 patch onto the skin every other day. To be used with 12.5 mcg patch for total of 62.5 mcg every 48h 15 patch 0   gabapentin (NEURONTIN) 300 MG capsule Take 1 capsule (300 mg total) by mouth 3 (three) times daily. 90 capsule 1   GNP MAGNESIUM OXIDE PO Take 400 mg by mouth daily. Gummie OTC--takes 800 mg/day     HYDROmorphone (DILAUDID) 4 MG tablet Take 1 tablet (4 mg total) by mouth every 3 (three) hours as needed for severe pain. 90 tablet 0   lidocaine (LIDODERM) 5 % Place 1 patch onto the  skin daily. Remove & Discard patch within 12 hours or as directed by MD 30 patch 0   lidocaine-prilocaine (EMLA) cream Apply 1 Application topically as needed. 30 g 2   meloxicam (MOBIC) 15 MG tablet Take 1 tablet (15 mg total) by mouth daily. 30 tablet 0   modafinil (PROVIGIL) 200 MG tablet Take 1 tablet (200 mg total) by mouth daily with breakfast. 30 tablet 1   Naldemedine Tosylate (SYMPROIC) 0.2 MG TABS Take as directed.     OVER THE COUNTER MEDICATION Take 2 tablets by mouth daily at 6 (six) AM. Superbeet gummies     pantoprazole (PROTONIX) 40 MG tablet Take 40 mg by mouth daily before breakfast.     traZODone (DESYREL) 50 MG tablet Take 1 tablet (50 mg total) by mouth at bedtime. 30 tablet 0   diazepam (VALIUM) 2 MG tablet Take 0.5 tablets (1 mg total) by mouth every 12 (twelve) hours as needed for anxiety or muscle spasms. (Patient not taking: Reported on 05/14/2022) 30 tablet 0   methylnaltrexone (RELISTOR) 12 MG/0.6ML SOLN injection Inject 0.6 mLs (12 mg total) into the skin daily as needed for up to 4 doses for severe constipation. (Patient not taking: Reported on 05/14/2022) 2.4 mL 0   No current facility-administered medications for this  encounter.     REVIEW OF SYSTEMS: On review of systems, the patient reports that she is feeling much better than when we last spoke. The current regimen for pain includes fentanyl transdermal patch, as well as scheduled Dilaudid.  She is using Symproic for her bowel function which seems to be somewhat helpful.  Her pain is well-controlled now to where is her quality of life has increased significantly and the patient feels as though she has been able to enjoy more activity.  This past week she has been able to walk in the park.     PHYSICAL EXAM:  Wt Readings from Last 3 Encounters:  05/23/22 105 lb 6.4 oz (47.8 kg)  05/15/22 103 lb 5 oz (46.9 kg)  05/14/22 102 lb (46.3 kg)   Temp Readings from Last 3 Encounters:  05/23/22 97.9 F (36.6 C)  05/15/22 99.1 F (37.3 C) (Oral)  05/14/22 98.2 F (36.8 C) (Oral)   BP Readings from Last 3 Encounters:  05/23/22 107/75  05/15/22 97/65  05/14/22 112/61   Pulse Readings from Last 3 Encounters:  05/23/22 93  05/15/22 65  05/14/22 61    0/10 pain but  In general this is a thin but very lucid and attentive high functioning appearing Caucasian female in no acute distress.  She's alert and oriented x4 and appropriate throughout the examination. Cardiopulmonary assessment is negative for acute distress and she exhibits normal effort.     ECOG = 1  0 - Asymptomatic (Fully active, able to carry on all predisease activities without restriction)  1 - Symptomatic but completely ambulatory (Restricted in physically strenuous activity but ambulatory and able to carry out work of a light or sedentary nature. For example, light housework, office work)  2 - Symptomatic, <50% in bed during the day (Ambulatory and capable of all self care but unable to carry out any work activities. Up and about more than 50% of waking hours)  3 - Symptomatic, >50% in bed, but not bedbound (Capable of only limited self-care, confined to bed or chair 50% or more of  waking hours)  4 - Bedbound (Completely disabled. Cannot carry on any self-care. Totally confined to bed or chair)  5 - Death   Santiago Glad MM, Creech RH, Tormey DC, et al. [phone]). "Toxicity and response criteria of the [facility]". Am. Evlyn Clines. Oncol. 5 (6): 649-55    LABORATORY DATA:  Lab Results  Component Value Date   WBC 8.9 05/15/2022   HGB 11.0 (L) 05/15/2022   HCT 34.5 (L) 05/15/2022   MCV 99.1 05/15/2022   PLT 141 (L) 05/15/2022   Lab Results  Component Value Date   NA 138 05/15/2022   K 3.8 05/15/2022   CL 104 05/15/2022   CO2 31 05/15/2022   Lab Results  Component Value Date   ALT 16 05/15/2022   AST 10 (L) 05/15/2022   ALKPHOS 59 05/15/2022   BILITOT 0.3 05/15/2022      RADIOGRAPHY: No results found.     IMPRESSION/PLAN: 1.  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 Dr. Mitzi Hansen discusses her complicated situation regarding progressive disease despite systemic treatments.  While her tumor's location is not in an amenable position for stereotactic radiation, Dr. Mitzi Hansen does offer conformal external beam radiation.  After discussing how well she has been feeling and her quality of life having improved greatly, Dr. Mitzi Hansen would offer a course of radiation, we discussed specifically related to her situation that palliative radiation over 3 weeks time versus more definitive course over 5-1/2 weeks with chemosensitization could be considered.  While her scans at [facility] have previously demonstrated concern for tiny disease in the liver, her scans within our system have not shown the same findings or concerns.  The imaging describing this however has been CT-based so we discussed the consideration of an MRI of the lumbar to rule out liver disease at baseline which could help determine which modality of radiation would be the most appropriate approach.  If she has liver disease, we decided on 3-week course of palliative  radiation would be most appropriate, however if no liver disease is seen it would be reasonable to consider chemoradiation over 5-1/2 weeks.  We also discussed the concern of abutting the stomach by imaging and how this could impact short or long-term risks with or without radiation of eroding into the stomach.  Knowing whether or not her tumor is threatening the integrity of the tissue planes would also be beneficial at better determining which modality of radiation would be, Dr. Mitzi Hansen recommends an MRI to be able to see the tissue planes of the stomach and pancreas.  We will place a separate MRI for this purpose as well.  We will follow-up with the timing of her MRI and following MRI results to make decisions regarding intention of radiation.  We will likely schedule simulation to follow her MRI scan.  She is in agreement with this plan.  She is also interested in pursuing guardant 360 again to determine if any additional mutations have lesion that would offer targeted treatment. We discussed the risks, benefits, short, and long term effects of radiotherapy, as well as the overall palliative intent, and the patient is interested in proceeding once we have further clarity on her imaging.  In a visit lasting 60 minutes, greater than 50% of the time was spent face to face discussing the patient's condition, in preparation for the discussion, and coordinating the patient's care.   The above documentation reflects my direct findings during this shared patient visit. Please see the separate note by Dr. Mitzi Hansen on this date for the remainder of the patient's plan of care.    Jill Side  Alfonse Ras, PAC   **Disclaimer: This note was dictated with voice recognition software. Similar sounding words can inadvertently be transcribed and this note may contain transcription errors which may not have been corrected upon publication of note.**

## 2022-05-22 ENCOUNTER — Other Ambulatory Visit: Payer: Self-pay | Admitting: Oncology

## 2022-05-22 ENCOUNTER — Other Ambulatory Visit: Payer: Self-pay | Admitting: Internal Medicine

## 2022-05-22 ENCOUNTER — Other Ambulatory Visit: Payer: Self-pay | Admitting: Nurse Practitioner

## 2022-05-22 DIAGNOSIS — C251 Malignant neoplasm of body of pancreas: Secondary | ICD-10-CM

## 2022-05-22 NOTE — Progress Notes (Signed)
GI Location of Tumor / Histology: Pancreas- Body   CT Abd/Pelvis 04/15/2022: Heterogeneous lesion in the pancreatic neck/body junction measures 5.7 x 4.2 cm.   CT Abd/pelvis 03/20/2022: Slight interval increase in size of hypodense mass involving the pancreatic neck and body, this measures 5 x 3.1 cm, previously 4 x 2.3 cm. Extension of mass towards the posterior gastric antrum.  CT CAP 02/23/2022: ncreased size of the mass in the pancreatic neck/body junction measuring 4.0 x 2.3 cm.  No evidence of new or progressive metastatic disease within the chest, abdomen or pelvis.  CT CAP 12/20/2021: Again identified is pancreatic tail and upstream body atrophy and duct dilatation. This continues to the level of a pancreatic neck/body junction mass which measures 2.6 x 2.4 cm.  2.9 x 2.7 cm on the prior exam when remeasured in a similar fashion.    No evidence of residual hepatic metastasis.  No new or progressive disease.  Biopsies of   Past/Anticipated interventions by surgeon, if any:   Past/Anticipated interventions by medical oncology, if any:  Dr. Truett Perna 05/14/2022 -We discussed salvage treatment options for the pancreas cancer including standard chemotherapy regimens (5-FU/liposomal irinotecan, gemcitabine/cisplatin), palliative radiation/Xeloda, and targeted therapy.  -There was no apparent target on the Guardant360 testing from June 2023.  We will confer with Dr. Johny Blamer prior to submitting peripheral blood for repeat NGS testing.  We also discussed clinical trials.    Weight changes, if any:   Bowel/Bladder complaints, if any:   Nausea / Vomiting, if any:   Pain issues, if any:    Any blood per rectum:     SAFETY ISSUES: Prior radiation?  Pacemaker/ICD?  Possible current pregnancy? Postmenopausal  Is the patient on methotrexate?   Current Complaints/Details:

## 2022-05-23 ENCOUNTER — Encounter: Payer: Self-pay | Admitting: Radiation Oncology

## 2022-05-23 ENCOUNTER — Telehealth: Payer: Self-pay | Admitting: *Deleted

## 2022-05-23 ENCOUNTER — Ambulatory Visit
Admission: RE | Admit: 2022-05-23 | Discharge: 2022-05-23 | Disposition: A | Discharge status: Home / Self Care | Payer: BC Managed Care – PPO | Source: Ambulatory Visit | Attending: Radiation Oncology | Admitting: Radiation Oncology

## 2022-05-23 ENCOUNTER — Other Ambulatory Visit: Payer: Self-pay

## 2022-05-23 VITALS — BP 107/75 | HR 93 | Temp 97.9°F | Resp 18 | Ht 70.0 in | Wt 105.4 lb

## 2022-05-23 DIAGNOSIS — Z791 Long term (current) use of non-steroidal anti-inflammatories (NSAID): Secondary | ICD-10-CM | POA: Insufficient documentation

## 2022-05-23 DIAGNOSIS — R933 Abnormal findings on diagnostic imaging of other parts of digestive tract: Secondary | ICD-10-CM

## 2022-05-23 DIAGNOSIS — Z7952 Long term (current) use of systemic steroids: Secondary | ICD-10-CM | POA: Insufficient documentation

## 2022-05-23 DIAGNOSIS — Z7901 Long term (current) use of anticoagulants: Secondary | ICD-10-CM | POA: Diagnosis not present

## 2022-05-23 DIAGNOSIS — Z79899 Other long term (current) drug therapy: Secondary | ICD-10-CM | POA: Insufficient documentation

## 2022-05-23 DIAGNOSIS — Z803 Family history of malignant neoplasm of breast: Secondary | ICD-10-CM | POA: Insufficient documentation

## 2022-05-23 DIAGNOSIS — C251 Malignant neoplasm of body of pancreas: Secondary | ICD-10-CM

## 2022-05-23 NOTE — Telephone Encounter (Signed)
CALLED PATIENT TO INFORM OF MRI FOR 05/24/22- ARRIVAL TIME- 9:30 AM @ [facility], PATIENT TO BE NPO- 4 HRS. PRIOR TO TEST, SPOKE WITH PATIENT AND SHE IS AWARE OF THIS SCAN AND THE INSTRUCTIONS

## 2022-05-24 ENCOUNTER — Inpatient Hospital Stay: Payer: BC Managed Care – PPO

## 2022-05-24 ENCOUNTER — Ambulatory Visit (HOSPITAL_BASED_OUTPATIENT_CLINIC_OR_DEPARTMENT_OTHER)
Admission: RE | Admit: 2022-05-24 | Discharge: 2022-05-24 | Disposition: A | Discharge status: Home / Self Care | Payer: BC Managed Care – PPO | Source: Ambulatory Visit | Attending: Radiation Oncology | Admitting: Radiation Oncology

## 2022-05-24 ENCOUNTER — Encounter (HOSPITAL_BASED_OUTPATIENT_CLINIC_OR_DEPARTMENT_OTHER): Payer: Self-pay

## 2022-05-24 DIAGNOSIS — R933 Abnormal findings on diagnostic imaging of other parts of digestive tract: Secondary | ICD-10-CM | POA: Insufficient documentation

## 2022-05-24 DIAGNOSIS — C251 Malignant neoplasm of body of pancreas: Secondary | ICD-10-CM | POA: Diagnosis not present

## 2022-05-24 DIAGNOSIS — Z95828 Presence of other vascular implants and grafts: Secondary | ICD-10-CM

## 2022-05-24 MED ORDER — SODIUM CHLORIDE 0.9% FLUSH
10.0000 mL | INTRAVENOUS | Status: DC | PRN
  Administered 2022-05-24: 10 mL via INTRAVENOUS

## 2022-05-24 MED ORDER — HEPARIN SOD (PORK) LOCK FLUSH 100 UNIT/ML IV SOLN
500.0000 [IU] | Freq: Once | INTRAVENOUS | Status: AC
  Administered 2022-05-24: 500 [IU] via INTRAVENOUS

## 2022-05-24 MED ORDER — GADOBUTROL 1 MMOL/ML IV SOLN
4.7000 mL | Freq: Once | INTRAVENOUS | Status: AC | PRN
  Administered 2022-05-24: 4.7 mL via INTRAVENOUS
  Filled 2022-05-24: qty 6

## 2022-05-24 NOTE — Patient Instructions (Signed)

## 2022-05-27 ENCOUNTER — Telehealth: Payer: Self-pay | Admitting: Radiation Oncology

## 2022-05-27 NOTE — Telephone Encounter (Signed)
I had left a VM over the weekend for the patient regarding her MRI results and Dr. Joellen Jersey recommendation for 3 weeks of palliative radiotherapy given concerns for new liver disease.   I spoke with Dr. Phillips Odor today as well. I had to leave another VM today when I called to try to reach the patietn again today. I let her know a summary of the discussion we've had with myself and Dr. Mitzi Hansen and myself and Dr. Phillips Odor, and that our staff would be reaching out to schedule simulation.

## 2022-05-28 ENCOUNTER — Other Ambulatory Visit: Payer: Self-pay | Admitting: Nurse Practitioner

## 2022-05-28 DIAGNOSIS — C251 Malignant neoplasm of body of pancreas: Secondary | ICD-10-CM

## 2022-05-28 DIAGNOSIS — G893 Neoplasm related pain (acute) (chronic): Secondary | ICD-10-CM

## 2022-05-28 DIAGNOSIS — Z515 Encounter for palliative care: Secondary | ICD-10-CM

## 2022-05-28 MED ORDER — FENTANYL 50 MCG/HR TD PT72
1.0000 | MEDICATED_PATCH | TRANSDERMAL | 0 refills | Status: DC
Start: 2022-05-28 — End: 2022-05-31

## 2022-05-28 MED ORDER — FENTANYL 12 MCG/HR TD PT72
1.0000 | MEDICATED_PATCH | TRANSDERMAL | 0 refills | Status: DC
Start: 2022-05-28 — End: 2022-05-31

## 2022-05-29 ENCOUNTER — Inpatient Hospital Stay: Payer: BC Managed Care – PPO | Admitting: Oncology

## 2022-05-29 VITALS — BP 128/81 | HR 88 | Temp 98.2°F | Resp 20 | Ht 70.0 in | Wt 105.4 lb

## 2022-05-29 DIAGNOSIS — C251 Malignant neoplasm of body of pancreas: Secondary | ICD-10-CM

## 2022-05-29 NOTE — Progress Notes (Signed)
[facility] OFFICE PROGRESS NOTE   Diagnosis: Pancreas cancer  INTERVAL HISTORY:   Tara Jensen returns for scheduled visit.  She reports adequate pain control with the current narcotic regimen.  She is maintained on a Duragesic patch and scheduled hydromorphone.  She continues to have constipation, but she is having bowel movements.  Her energy level has improved.  She has a good appetite. She saw Dr. Johny Blamer for a telehealth visit on 05/15/2022.  She recommends considering gemcitabine/cisplatin and clinical trials.  Tara Jensen met with Dr. Mitzi Hansen and plans to complete a course of palliative radiation to the pancreas beginning next week.  Objective:  Vital signs in last 24 hours:  Blood pressure 128/81, pulse 88, temperature 98.2 F (36.8 C), temperature source Oral, resp. rate 20, height 5\' 10"  (1.778 m), weight 105 lb 6.4 oz (47.8 kg), last menstrual period 11/01/2018, SpO2 100 %.    Resp: Lungs clear bilaterally Cardio: Regular rate and rhythm GI: Firm fullness in the mid upper abdomen, no hepatomegaly Vascular: No leg edema  Portacath/PICC-without erythema  Lab Results:  Lab Results  Component Value Date   WBC 8.9 05/15/2022   HGB 11.0 (L) 05/15/2022   HCT 34.5 (L) 05/15/2022   MCV 99.1 05/15/2022   PLT 141 (L) 05/15/2022   NEUTROABS 6.8 05/15/2022    CMP  Lab Results  Component Value Date   NA 138 05/15/2022   K 3.8 05/15/2022   CL 104 05/15/2022   CO2 31 05/15/2022   GLUCOSE 103 (H) 05/15/2022   BUN 24 (H) 05/15/2022   CREATININE 0.51 05/15/2022   CALCIUM 9.1 05/15/2022   PROT 6.5 05/15/2022   ALBUMIN 3.6 05/15/2022   AST 10 (L) 05/15/2022   ALT 16 05/15/2022   ALKPHOS 59 05/15/2022   BILITOT 0.3 05/15/2022   GFRNONAA >60 05/15/2022   GFRAA  12/27/2021    QUESTIONABLE RESULTS, RECOMMEND RECOLLECT TO VERIFY    Lab Results  Component Value Date   UUV253 3,261 (H) 04/21/2022    Medications: I have reviewed the patient's current  medications.   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  Less than 180 degree abutment of the distal celiac, common hepatic, and splenic arteries, less than 180 degree abutment of the SMA CT chest 07/18/2021-no evidence of metastatic disease EUS 07/16/2021-extrinsic impression on the stomach at the posterior wall of the gastric body, no gross lesion in the duodenum, 45 x 36 mm pancreas body mass, abutment of the SMA, celiac trunk, and compression of the splenoportal confluence.  No malignant appearing lymph nodes, numerous venous collaterals adjacent to the portal vein, FNA biopsy-malignant cells consistent with adenocarcinoma, T4N0 by EUS Markedly elevated CA 19-9 Guardant360 07/31/2021-K-ras G12R, MSI high-not detected, ATM VUS Cycle 1 FOLFOX 08/08/2021 Cycle 2 FOLFIRINOX 08/22/2021 CT abdomen[facility]pelvis at [facility] 08/31/2021-hypoattenuating liver lesions consistent with metastases, mass in the pancreas neck/body with greater than 100 degree encasement of the celiac axis and common hepatic artery.  Less than 180 degree abutment of the SMA, long segment occlusion of the portal splenic confluence, splenic vein occluded, multiple collateral vessels in the upper abdomen, prominent left periaortic node Cycle 3 FOLFIRINOX 09/05/2021 Cycle 4 FOLFIRINOX 09/19/2021 Cycle 5 FOLFIRINOX 10/03/2021 CTs 10/16/2021-decrease size of primary pancreas mass, hepatic metastases, and a suspicious retroperitoneal node, resolution of left supraclavicular/low jugular adenopathy compared to a  CT chest 07/18/2021.  No evidence of disease progression. Cycle 6 FOLFIRINOX 10/17/2021 Cycle 7 FOLFIRINOX 10/31/2021 Cycle 8 FOLFIRINOX 11/14/2021 Cycle 9 FOLFIRINOX 11/28/2021 Cycle 10 FOLFIRINOX 12/12/2021, oxaliplatin held secondary  to neuropathy symptoms CTs 12/20/2021-decrease size of pancreas primary, no evidence of hepatic metastases, no new or progressive disease Cycle 11 FOLFIRINOX 12/27/2021, oxaliplatin held due to neuropathy symptoms Cycle 12 FOLFIRINOX 01/09/2022, oxaliplatin held 01/22/2022-C19-9 higher, 530 Cycle 13 FOLFIRINOX 01/24/2022, oxaliplatin resumed Cycle 14 FOLFIRINOX 02/06/2022 CTs 02/23/2022-increased size of the pancreas body mass new focal gastric wall thickening and loss of fat plane between the mass and the proximal gastric wall, no evidence of new or progressive metastatic disease Cycle 1 gemcitabine/Abraxane 03/06/2022 Cycle 2 gemcitabine/Abraxane 03/20/2022 CT abdomen/pelvis was 03/20/2022-slight interval increase in size of the pancreas neck/body mass with extension to the posterior gastric antrum Cycle 3 gemcitabine/Abraxane 04/04/2022-dose reduction secondary to thrombocytopenia CT abdomen/pelvis 04/14/2021-increase size of the pancreas neck/body mass with probable involvement of the gastric wall, progressive encasement of the celiac artery with chronic involvement of the portal splenic confluence MRI abdomen 05/24/2022-increase in size of the pancreas neck/body mass with vascular encasement and new thrombus in the extrahepatic portal vein, new small peripheral enhancing structure in segment 2 of the liver concerning for a metastasis, trace ascites, large stool burden Pain secondary to #1-Dilaudid drip started 04/18/2022, converted to a fentanyl drip followed by a fentanyl patch and oral Dilaudid for breakthrough pain Celiac plexus block 03/11/2022 Celiac plexus block 04/22/2022 Weight loss secondary to #1 History of situational depression Family history of breast cancer 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     Disposition: [name] has a history of pancreas cancer.  She was felt to have metastatic disease to the liver when initially evaluated at [facility].  The current MRI reveals a single liver lesion suspicious for metastasis.  She understands she has not been proven to have distant metastatic disease.  She has a locally advanced and unresectable tumor.  We discussed treatment options again today.  She understands there is a small chance of a clinical response with further standard systemic therapy.  She plans to complete a course of palliative radiation to the pancreas with the hope of improving pain and delaying progression.  We decided to repeat Guardant360 testing to see if she may be a candidate for a targeted therapy or clinical trial.  She will continue follow-up with palliative care medicine for pain management.  She will return for an office visit at the completion of radiation.  I will contact Dr. Mitzi Hansen to discuss the indication for capecitabine being during the course of radiation.  We reviewed potential toxicities associated with capecitabine including the chance of mucositis, diarrhea, hematologic toxicity, rash, sun sensitivity, hyperpigmentation, and hand/foot syndrome.    Thornton Papas, MD  05/29/2022  11:12 AM

## 2022-05-30 ENCOUNTER — Ambulatory Visit
Admission: RE | Admit: 2022-05-30 | Discharge: 2022-05-30 | Disposition: A | Discharge status: Home / Self Care | Payer: BC Managed Care – PPO | Source: Ambulatory Visit | Attending: Radiation Oncology | Admitting: Radiation Oncology

## 2022-05-30 ENCOUNTER — Other Ambulatory Visit: Payer: Self-pay | Admitting: *Deleted

## 2022-05-30 ENCOUNTER — Other Ambulatory Visit: Payer: Self-pay

## 2022-05-30 ENCOUNTER — Other Ambulatory Visit: Payer: Self-pay | Admitting: Nurse Practitioner

## 2022-05-30 ENCOUNTER — Other Ambulatory Visit (HOSPITAL_COMMUNITY): Payer: Self-pay

## 2022-05-30 ENCOUNTER — Other Ambulatory Visit: Payer: Self-pay | Admitting: Internal Medicine

## 2022-05-30 ENCOUNTER — Other Ambulatory Visit: Payer: Self-pay | Admitting: Oncology

## 2022-05-30 ENCOUNTER — Encounter: Payer: Self-pay | Admitting: *Deleted

## 2022-05-30 ENCOUNTER — Telehealth: Payer: Self-pay

## 2022-05-30 DIAGNOSIS — C251 Malignant neoplasm of body of pancreas: Secondary | ICD-10-CM | POA: Insufficient documentation

## 2022-05-30 DIAGNOSIS — Z510 Encounter for antineoplastic radiation therapy: Secondary | ICD-10-CM | POA: Diagnosis present

## 2022-05-30 DIAGNOSIS — G893 Neoplasm related pain (acute) (chronic): Secondary | ICD-10-CM

## 2022-05-30 DIAGNOSIS — Z515 Encounter for palliative care: Secondary | ICD-10-CM

## 2022-05-30 MED ORDER — CAPECITABINE 500 MG PO TABS
ORAL_TABLET | ORAL | 0 refills | Status: DC
  Filled 2022-05-30: qty 75, fill #0

## 2022-05-30 NOTE — Telephone Encounter (Signed)
Oral Oncology Patient Advocate Encounter  New authorization   Received notification that prior authorization for Capecitabine is required.   PA submitted on 05/30/22  Key [id number]  Status is pending     Ardeen Fillers, CPhT Oncology Pharmacy Patient Advocate  [facility]  [phone] (phone) [phone] (fax) 05/30/2022 1:09 PM

## 2022-05-30 NOTE — Telephone Encounter (Signed)
Oral Oncology Patient Advocate Encounter  Prior Authorization for Capecitabine has been approved.    PA# [id number]  Effective dates: 05/30/22 through 05/30/23  Patients co-pay is $0.00.    Ardeen Fillers, CPhT Oncology Pharmacy Patient Advocate  [facility]  [phone] (phone) [phone] (fax) 05/30/2022 1:24 PM

## 2022-05-30 NOTE — Progress Notes (Signed)
PATIENT NAVIGATOR PROGRESS NOTE  Name: Tara Jensen Date: 05/30/2022 MRN: [id number]  DOB: [birth year]   Reason for visit:  Coordination of care  Comments:  Called and spoke with [name] regarding initiation of concurrent Xeloda/Radiation We discussed how to take, when to take and that she will receive call from Oral Oncology team regarding prescription  We discussed F/U labs that Dr Truett Perna would like drawn on 5/13 and [name] would like for them to be drawn at [facility] when she is there for radiation. Scheduling message sent.   We discussed end of life decisions and she is in the process of revising her will and working with SS for disability. She expressed grief and hard decisions of facing her mortality. Active listening and supportive presence offered and appreciated    Time spent counseling/coordinating care: 45-60 minutes

## 2022-05-31 ENCOUNTER — Inpatient Hospital Stay: Payer: BC Managed Care – PPO

## 2022-05-31 ENCOUNTER — Other Ambulatory Visit: Payer: Self-pay | Admitting: *Deleted

## 2022-05-31 ENCOUNTER — Telehealth: Payer: Self-pay | Admitting: Pharmacist

## 2022-05-31 ENCOUNTER — Other Ambulatory Visit: Payer: Self-pay | Admitting: Internal Medicine

## 2022-05-31 ENCOUNTER — Other Ambulatory Visit (HOSPITAL_COMMUNITY): Payer: Self-pay

## 2022-05-31 DIAGNOSIS — C251 Malignant neoplasm of body of pancreas: Secondary | ICD-10-CM

## 2022-05-31 DIAGNOSIS — G893 Neoplasm related pain (acute) (chronic): Secondary | ICD-10-CM

## 2022-05-31 DIAGNOSIS — Z95828 Presence of other vascular implants and grafts: Secondary | ICD-10-CM

## 2022-05-31 DIAGNOSIS — Z515 Encounter for palliative care: Secondary | ICD-10-CM

## 2022-05-31 LAB — CBC WITH DIFFERENTIAL (CANCER CENTER ONLY)
Abs Immature Granulocytes: 0.02 10*3/uL (ref 0.00–0.07)
Basophils Absolute: 0 10*3/uL (ref 0.0–0.1)
Basophils Relative: 0 %
Eosinophils Absolute: 0.1 10*3/uL (ref 0.0–0.5)
Eosinophils Relative: 1 %
HCT: 31.6 % — ABNORMAL LOW (ref 36.0–46.0)
Hemoglobin: 10.1 g/dL — ABNORMAL LOW (ref 12.0–15.0)
Immature Granulocytes: 0 %
Lymphocytes Relative: 18 %
Lymphs Abs: 1 10*3/uL (ref 0.7–4.0)
MCH: 31.6 pg (ref 26.0–34.0)
MCHC: 32 g/dL (ref 30.0–36.0)
MCV: 98.8 fL (ref 80.0–100.0)
Monocytes Absolute: 0.4 10*3/uL (ref 0.1–1.0)
Monocytes Relative: 7 %
Neutro Abs: 4.3 10*3/uL (ref 1.7–7.7)
Neutrophils Relative %: 74 %
Platelet Count: 131 10*3/uL — ABNORMAL LOW (ref 150–400)
RBC: 3.2 MIL/uL — ABNORMAL LOW (ref 3.87–5.11)
RDW: 14.2 % (ref 11.5–15.5)
WBC Count: 5.8 10*3/uL (ref 4.0–10.5)
nRBC: 0 % (ref 0.0–0.2)

## 2022-05-31 LAB — CMP (CANCER CENTER ONLY)
ALT: 16 U/L (ref 0–44)
AST: 14 U/L — ABNORMAL LOW (ref 15–41)
Albumin: 3.9 g/dL (ref 3.5–5.0)
Alkaline Phosphatase: 58 U/L (ref 38–126)
Anion gap: 7 (ref 5–15)
BUN: 26 mg/dL — ABNORMAL HIGH (ref 6–20)
CO2: 29 mmol/L (ref 22–32)
Calcium: 9.2 mg/dL (ref 8.9–10.3)
Chloride: 104 mmol/L (ref 98–111)
Creatinine: 0.52 mg/dL (ref 0.44–1.00)
GFR, Estimated: >60 mL/min (ref >60)
Glucose, Bld: 106 mg/dL — ABNORMAL HIGH (ref 70–99)
Potassium: 3.8 mmol/L (ref 3.5–5.1)
Sodium: 140 mmol/L (ref 135–145)
Total Bilirubin: 0.3 mg/dL (ref 0.3–1.2)
Total Protein: 6.6 g/dL (ref 6.5–8.1)

## 2022-05-31 MED ORDER — CAPECITABINE 500 MG PO TABS
ORAL_TABLET | ORAL | 0 refills | Status: DC
Start: 2022-06-10 — End: 2022-07-04
  Filled 2022-05-31: qty 75, fill #0
  Filled 2022-06-03: qty 75, 15d supply, fill #0

## 2022-05-31 MED ORDER — FENTANYL 12 MCG/HR TD PT72
1.0000 | MEDICATED_PATCH | TRANSDERMAL | 0 refills | Status: DC
Start: 2022-05-31 — End: 2022-06-27

## 2022-05-31 MED ORDER — SODIUM CHLORIDE 0.9% FLUSH
10.0000 mL | INTRAVENOUS | Status: DC | PRN
  Administered 2022-05-31: 10 mL via INTRAVENOUS

## 2022-05-31 MED ORDER — FENTANYL 50 MCG/HR TD PT72
1.0000 | MEDICATED_PATCH | TRANSDERMAL | 0 refills | Status: DC
Start: 2022-05-31 — End: 2022-06-27

## 2022-05-31 MED ORDER — HEPARIN SOD (PORK) LOCK FLUSH 100 UNIT/ML IV SOLN
500.0000 [IU] | Freq: Once | INTRAVENOUS | Status: AC
  Administered 2022-05-31: 500 [IU] via INTRAVENOUS

## 2022-05-31 NOTE — Telephone Encounter (Signed)
Oral Oncology Pharmacist Encounter  Received new prescription for Xeloda (capecitabine) for the treatment of pancreatic cancer in conjunction with radiation, planned duration until the end of radiation. Radiation is scheduled to start on 06/10/22.  CMP from 05/31/22 assessed, no relevant lab abnormalities. Prescription dose and frequency assessed.   Current medication list in Epic reviewed, one DDIs with capecitabine identified: Pantoprazole: Proton Pump Inhibitors (PPI) may diminish the therapeutic effect of capecitabine, varying information on the clinical impact. Recommend evaluating the need for a PPI/acid suppression. If acid suppression is needed, attempt switching to a H2 antagonist (eg, famotidine) if possible.  Evaluated chart and no patient barriers to medication adherence identified.   Prescription has been e-scribed to the [facility] for benefits analysis and approval.  Oral Oncology Clinic will continue to follow for insurance authorization, copayment issues, initial counseling and start date.   Remi Haggard, PharmD, BCPS, BCOP, CPP Hematology/Oncology Clinical Pharmacist Practitioner [place]/DB/AP Oral Chemotherapy Navigation Clinic [phone]  05/31/2022 4:30 PM

## 2022-06-01 ENCOUNTER — Other Ambulatory Visit: Payer: Self-pay | Admitting: Internal Medicine

## 2022-06-01 ENCOUNTER — Other Ambulatory Visit (HOSPITAL_COMMUNITY): Payer: Self-pay

## 2022-06-01 DIAGNOSIS — C251 Malignant neoplasm of body of pancreas: Secondary | ICD-10-CM

## 2022-06-01 MED ORDER — FAMOTIDINE 40 MG PO TABS: 40.0000 mg | ORAL_TABLET | Freq: Two times a day (BID) | ORAL | 2 refills | Status: DC

## 2022-06-01 MED ORDER — DEXAMETHASONE 4 MG PO TABS: 8.0000 mg | ORAL_TABLET | Freq: Every day | ORAL | 2 refills | Status: DC

## 2022-06-01 MED ORDER — HYDROMORPHONE HCL 4 MG PO TABS
4.0000 mg | ORAL_TABLET | ORAL | 0 refills | Status: DC | PRN
Start: 2022-06-01 — End: 2022-06-22

## 2022-06-01 MED ORDER — DOCUSATE SODIUM 250 MG PO CAPS: 250.0000 mg | ORAL_CAPSULE | Freq: Two times a day (BID) | ORAL | 2 refills | Status: DC

## 2022-06-01 MED ORDER — BISACODYL 10 MG RE SUPP: 10.0000 mg | RECTAL | 2 refills | Status: DC | PRN

## 2022-06-01 MED ORDER — MELOXICAM 15 MG PO TABS: 15.0000 mg | ORAL_TABLET | Freq: Every day | ORAL | 3 refills | Status: DC

## 2022-06-02 LAB — CANCER ANTIGEN 19-9: CA 19-9: 3523 U/mL — ABNORMAL HIGH (ref 0–35)

## 2022-06-03 ENCOUNTER — Telehealth: Payer: Self-pay | Admitting: Pharmacist

## 2022-06-03 ENCOUNTER — Other Ambulatory Visit: Payer: Self-pay

## 2022-06-03 ENCOUNTER — Other Ambulatory Visit (HOSPITAL_COMMUNITY): Payer: Self-pay

## 2022-06-03 NOTE — Telephone Encounter (Signed)
Oral Chemotherapy Pharmacist Encounter   I spoke with patient for overview of: Xeloda for the treatment of pancreatic cancer in conjunction with radiation, planned duration 3 weeks.   Counseled patient on administration, dosing, side effects, monitoring, drug-food interactions, safe handling, storage, and disposal.  Patient will take Xeloda  tablets, 3 tablets (1500 mg) by mouth in AM and 2 tabs (1000 mg) by mouth in PM, within 30 minutes of finishing meals, on days of radiation only.  Xeloda and radiation start date: 06/10/22  Adverse effects of Xeloda include but are not limited to: fatigue, decreased blood counts, GI upset, diarrhea, mouth sores, and hand-foot syndrome.  Patient has anti-emetic on hand and knows to take it if nausea develops.   Patient will obtain anti diarrheal and alert the office of 4 or more loose stools above baseline.   Reviewed with patient importance of keeping a medication schedule and plan for any missed doses. No barriers to medication adherence identified.  Medication reconciliation performed and medication/allergy list updated.  Insurance authorization for Xeloda has been obtained. Test claim at the pharmacy revealed copayment $0. This will ship from the [facility] outpatient pharmacy on 06/04/22 to deliver to patient's home on 06/05/22.  Patient informed the pharmacy will reach out 5-7 days prior to needing next fill of Xeloda to coordinate continued medication acquisition to prevent break in therapy.  All questions answered.  Patient voiced understanding and appreciation.   Medication education handout placed in mail for patient. Patient knows to call the office with questions or concerns. Oral Chemotherapy Clinic phone number provided to patient.   Lennie Muckle, PharmD Candidate 06/03/2022  1:30 PM Oral Oncology Clinic [phone]

## 2022-06-03 NOTE — Telephone Encounter (Signed)
Patient successfully OnBoarded and drug education provided by pharmacist. Capecitabine scheduled to be shipped on 06/04/22 for delivery on 06/05/22 from [facility] to patient's address. Patient knows start date is 06/10/22 and to cal me at [phone] with any questions or concerns regarding cost of or receiving medication.    Ardeen Fillers, CPhT Oncology Pharmacy Patient Advocate  [facility]  [phone] (phone) [phone] (fax) 06/03/2022 1:21 PM

## 2022-06-05 ENCOUNTER — Inpatient Hospital Stay: Payer: BC Managed Care – PPO

## 2022-06-06 ENCOUNTER — Ambulatory Visit: Payer: BC Managed Care – PPO | Admitting: Radiation Oncology

## 2022-06-06 DIAGNOSIS — C251 Malignant neoplasm of body of pancreas: Secondary | ICD-10-CM | POA: Diagnosis not present

## 2022-06-10 ENCOUNTER — Other Ambulatory Visit: Payer: Self-pay

## 2022-06-10 ENCOUNTER — Ambulatory Visit
Admission: RE | Admit: 2022-06-10 | Discharge: 2022-06-10 | Disposition: A | Discharge status: Home / Self Care | Payer: BC Managed Care – PPO | Source: Ambulatory Visit | Attending: Radiation Oncology | Admitting: Radiation Oncology

## 2022-06-10 DIAGNOSIS — C251 Malignant neoplasm of body of pancreas: Secondary | ICD-10-CM | POA: Diagnosis not present

## 2022-06-10 LAB — RAD ONC ARIA SESSION SUMMARY
Course Elapsed Days: 0
Plan Fractions Treated to Date: 1
Plan Prescribed Dose Per Fraction: 2.5 Gy
Plan Total Fractions Prescribed: 15
Plan Total Prescribed Dose: 37.5 Gy
Reference Point Dosage Given to Date: 2.5 Gy
Reference Point Session Dosage Given: 2.5 Gy
Session Number: 1

## 2022-06-11 ENCOUNTER — Other Ambulatory Visit: Payer: Self-pay

## 2022-06-11 ENCOUNTER — Ambulatory Visit
Admission: RE | Admit: 2022-06-11 | Discharge: 2022-06-11 | Disposition: A | Discharge status: Home / Self Care | Payer: BC Managed Care – PPO | Source: Ambulatory Visit | Attending: Radiation Oncology | Admitting: Radiation Oncology

## 2022-06-11 DIAGNOSIS — C251 Malignant neoplasm of body of pancreas: Secondary | ICD-10-CM | POA: Diagnosis not present

## 2022-06-11 LAB — RAD ONC ARIA SESSION SUMMARY
Course Elapsed Days: 1
Plan Fractions Treated to Date: 2
Plan Prescribed Dose Per Fraction: 2.5 Gy
Plan Total Fractions Prescribed: 15
Plan Total Prescribed Dose: 37.5 Gy
Reference Point Dosage Given to Date: 5 Gy
Reference Point Session Dosage Given: 2.5 Gy
Session Number: 2

## 2022-06-12 ENCOUNTER — Ambulatory Visit
Admission: RE | Admit: 2022-06-12 | Discharge: 2022-06-12 | Disposition: A | Discharge status: Home / Self Care | Payer: BC Managed Care – PPO | Source: Ambulatory Visit | Attending: Radiation Oncology | Admitting: Radiation Oncology

## 2022-06-12 ENCOUNTER — Other Ambulatory Visit: Payer: Self-pay

## 2022-06-12 DIAGNOSIS — C251 Malignant neoplasm of body of pancreas: Secondary | ICD-10-CM | POA: Diagnosis present

## 2022-06-12 LAB — RAD ONC ARIA SESSION SUMMARY
Course Elapsed Days: 2
Plan Fractions Treated to Date: 3
Plan Prescribed Dose Per Fraction: 2.5 Gy
Plan Total Fractions Prescribed: 15
Plan Total Prescribed Dose: 37.5 Gy
Reference Point Dosage Given to Date: 7.5 Gy
Reference Point Session Dosage Given: 2.5 Gy
Session Number: 3

## 2022-06-13 ENCOUNTER — Other Ambulatory Visit: Payer: Self-pay

## 2022-06-13 ENCOUNTER — Ambulatory Visit
Admission: RE | Admit: 2022-06-13 | Discharge: 2022-06-13 | Disposition: A | Discharge status: Home / Self Care | Payer: BC Managed Care – PPO | Source: Ambulatory Visit | Attending: Radiation Oncology | Admitting: Radiation Oncology

## 2022-06-13 ENCOUNTER — Encounter: Payer: Self-pay | Admitting: *Deleted

## 2022-06-13 DIAGNOSIS — C251 Malignant neoplasm of body of pancreas: Secondary | ICD-10-CM | POA: Diagnosis not present

## 2022-06-13 LAB — RAD ONC ARIA SESSION SUMMARY
Course Elapsed Days: 3
Plan Fractions Treated to Date: 4
Plan Prescribed Dose Per Fraction: 2.5 Gy
Plan Total Fractions Prescribed: 15
Plan Total Prescribed Dose: 37.5 Gy
Reference Point Dosage Given to Date: 10 Gy
Reference Point Session Dosage Given: 2.5 Gy
Session Number: 4

## 2022-06-13 NOTE — Progress Notes (Signed)
PATIENT NAVIGATOR PROGRESS NOTE  Name: Tara Jensen Date: 06/13/2022 MRN: [id number]  DOB: 1965/02/28   Reason for visit: Telephone call to review Guardant 360 results and to check in on xeloda/radiation  Comments:  Left message for pt to return call    Time spent counseling/coordinating care: 15-30 minutes

## 2022-06-14 ENCOUNTER — Ambulatory Visit
Admission: RE | Admit: 2022-06-14 | Discharge: 2022-06-14 | Disposition: A | Discharge status: Home / Self Care | Payer: BC Managed Care – PPO | Source: Ambulatory Visit | Attending: Radiation Oncology | Admitting: Radiation Oncology

## 2022-06-14 ENCOUNTER — Encounter: Payer: Self-pay | Admitting: *Deleted

## 2022-06-14 ENCOUNTER — Other Ambulatory Visit: Payer: Self-pay

## 2022-06-14 DIAGNOSIS — C251 Malignant neoplasm of body of pancreas: Secondary | ICD-10-CM | POA: Diagnosis not present

## 2022-06-14 LAB — RAD ONC ARIA SESSION SUMMARY
Course Elapsed Days: 4
Plan Fractions Treated to Date: 5
Plan Prescribed Dose Per Fraction: 2.5 Gy
Plan Total Fractions Prescribed: 15
Plan Total Prescribed Dose: 37.5 Gy
Reference Point Dosage Given to Date: 12.5 Gy
Reference Point Session Dosage Given: 2.5 Gy
Session Number: 5

## 2022-06-14 MED ORDER — SONAFINE EX EMUL
1.0000 | Freq: Once | CUTANEOUS | Status: AC
  Administered 2022-06-14: 1 via TOPICAL

## 2022-06-17 ENCOUNTER — Ambulatory Visit
Admission: RE | Admit: 2022-06-17 | Discharge: 2022-06-17 | Disposition: A | Discharge status: Home / Self Care | Payer: BC Managed Care – PPO | Source: Ambulatory Visit | Attending: Radiation Oncology | Admitting: Radiation Oncology

## 2022-06-17 ENCOUNTER — Other Ambulatory Visit: Payer: Self-pay

## 2022-06-17 ENCOUNTER — Telehealth: Payer: Self-pay | Admitting: Pharmacist

## 2022-06-17 DIAGNOSIS — C251 Malignant neoplasm of body of pancreas: Secondary | ICD-10-CM | POA: Diagnosis not present

## 2022-06-17 LAB — RAD ONC ARIA SESSION SUMMARY
Course Elapsed Days: 7
Plan Fractions Treated to Date: 6
Plan Prescribed Dose Per Fraction: 2.5 Gy
Plan Total Fractions Prescribed: 15
Plan Total Prescribed Dose: 37.5 Gy
Reference Point Dosage Given to Date: 15 Gy
Reference Point Session Dosage Given: 2.5 Gy
Session Number: 6

## 2022-06-17 NOTE — Telephone Encounter (Signed)
Oral Chemotherapy Pharmacist Encounter   Patient called to report that 4 days last week in error she took 2 tablets bid of her capecitabine instead of the prescribed 3 tablets in AM and 2 tablets in PM. Patient was upset with herself for making this error.   Attempted to reassure patient that it would be okay. She felt better by the end of our conversation. Discussed use of a AM/PM pill tray, but patient felt she would not forget moving forward.   She knows to call with and future concerns or questions.   Remi Haggard, PharmD, BCPS, BCOP, CPP Hematology/Oncology Clinical Pharmacist [place]/DB/AP Oral Chemotherapy Navigation Clinic [phone]  06/17/2022 1:13 PM

## 2022-06-18 ENCOUNTER — Encounter: Payer: Self-pay | Admitting: Internal Medicine

## 2022-06-18 ENCOUNTER — Ambulatory Visit
Admission: RE | Admit: 2022-06-18 | Discharge: 2022-06-18 | Disposition: A | Discharge status: Home / Self Care | Payer: BC Managed Care – PPO | Source: Ambulatory Visit | Attending: Radiation Oncology | Admitting: Radiation Oncology

## 2022-06-18 ENCOUNTER — Other Ambulatory Visit: Payer: Self-pay

## 2022-06-18 ENCOUNTER — Other Ambulatory Visit: Payer: Self-pay | Admitting: Internal Medicine

## 2022-06-18 DIAGNOSIS — C251 Malignant neoplasm of body of pancreas: Secondary | ICD-10-CM | POA: Diagnosis not present

## 2022-06-18 LAB — RAD ONC ARIA SESSION SUMMARY
Course Elapsed Days: 8
Plan Fractions Treated to Date: 7
Plan Prescribed Dose Per Fraction: 2.5 Gy
Plan Total Fractions Prescribed: 15
Plan Total Prescribed Dose: 37.5 Gy
Reference Point Dosage Given to Date: 17.5 Gy
Reference Point Session Dosage Given: 2.5 Gy
Session Number: 7

## 2022-06-18 MED ORDER — DIPHENOXYLATE-ATROPINE 2.5-0.025 MG PO TABS: 1.0000 | ORAL_TABLET | Freq: Four times a day (QID) | ORAL | 3 refills | Status: DC | PRN

## 2022-06-18 NOTE — Progress Notes (Deleted)
Palliative Medicine [facility]  [phone] [phone]   Name: Tara Jensen Date: 06/18/2022 MRN: [id number]  DOB: [birth year]  Patient Care Team: Eartha Inch, MD as PCP - General (Family Medicine) Jerrell Mylar, Jacquelyne Balint, RN as Oncology Nurse Navigator Truett Perna Leighton Roach, MD as Consulting Physician (Oncology)    INTERVAL HISTORY: Tara Jensen is a 57 y.o. female with oncologic medical history including pancreatic cancer (07/2021) metastatic to the liver and gastric wall, GERD, anxiety, depression, constipation, and chemo induced peripheral neuropathy. Palliative ask to see for symptom and pain management and goals of care.     SOCIAL HISTORY:     reports that she has never smoked. She has never used smokeless tobacco. She reports that she does not currently use alcohol. She reports that she does not use drugs.  ADVANCE DIRECTIVES:  On file  CODE STATUS: DNR  PAST MEDICAL HISTORY: Past Medical History:  Diagnosis Date   Abnormal Pap smear    Anxiety    "situational"   BV (bacterial vaginosis)    Cancer (HCC)    pancreatic   Cervical polyp    Depression    "situational"   Family history of adverse reaction to anesthesia    mother had N/V   Recurrent UTI (urinary tract infection)     ALLERGIES:  has No Known Allergies.  MEDICATIONS:  Current Outpatient Medications  Medication Sig Dispense Refill   apixaban (ELIQUIS) 2.5 MG TABS tablet TAKE ONE TABLET BY MOUTH TWICE DAILY 60 tablet 2   bisacodyl (DULCOLAX) 10 MG suppository Place 1 suppository (10 mg total) rectally as needed for moderate constipation. 30 suppository 2   capecitabine (XELODA) 500 MG tablet Take 3 tablets (1500mg ) by mouth in AM and 2 tablets (1000mg ) in PM. Take with food. Take Monday-Friday. Take only on days of radiation. 75 tablet 0   dexamethasone (DECADRON) 4 MG tablet Take 2 tablets (8 mg total) by mouth daily. Take one tablet every AM or as directed by  provider 60 tablet 2   diazepam (VALIUM) 2 MG tablet Take 0.5 tablets (1 mg total) by mouth every 12 (twelve) hours as needed for anxiety or muscle spasms. (Patient not taking: Reported on 05/14/2022) 30 tablet 0   docusate sodium (COLACE) 250 MG capsule Take 1 capsule (250 mg total) by mouth 2 (two) times daily. 60 capsule 2   DULoxetine (CYMBALTA) 30 MG capsule TAKE ONE CAPSULE BY MOUTH DAILY 30 capsule 2   famotidine (PEPCID) 40 MG tablet Take 1 tablet (40 mg total) by mouth 2 (two) times daily. 60 tablet 2   fentaNYL (DURAGESIC) 12 MCG/HR Place 1 patch onto the skin every other day. APPLY WITH patch for total of 62 mcg 15 patch 0   fentaNYL (DURAGESIC) 50 MCG/HR Place 1 patch onto the skin every other day. To be used with 12.5 mcg patch for total of 62.5 mcg every 48h 15 patch 0   gabapentin (NEURONTIN) 300 MG capsule Take 1 capsule (300 mg total) by mouth 3 (three) times daily. 90 capsule 1   GNP MAGNESIUM OXIDE PO Take 400 mg by mouth daily. Gummie OTC--takes 800 mg/day     HYDROmorphone (DILAUDID) 4 MG tablet Take 1 tablet (4 mg total) by mouth every 3 (three) hours as needed for severe pain. 120 tablet 0   lidocaine (LIDODERM) 5 % Place 1 patch onto the skin daily. Remove & Discard patch within 12 hours or as directed by MD  30 patch 0   lidocaine-prilocaine (EMLA) cream Apply 1 Application topically as needed. 30 g 2   meloxicam (MOBIC) 15 MG tablet Take 1 tablet (15 mg total) by mouth daily. 30 tablet 3   methylnaltrexone (RELISTOR) 12 MG/0.6ML SOLN injection Inject 0.6 mLs (12 mg total) into the skin daily as needed for up to 4 doses for severe constipation. (Patient not taking: Reported on 05/14/2022) 2.4 mL 0   modafinil (PROVIGIL) 200 MG tablet Take 1 tablet (200 mg total) by mouth daily with breakfast. 30 tablet 1   Naldemedine Tosylate (SYMPROIC) 0.2 MG TABS Take as directed.     OVER THE COUNTER MEDICATION Take 2 tablets by mouth daily at 6 (six) AM. Superbeet gummies      pantoprazole (PROTONIX) 40 MG tablet Take 40 mg by mouth daily before breakfast.     traZODone (DESYREL) 50 MG tablet TAKE ONE TABLET BY MOUTH AT BEDTIME 30 tablet 2   No current facility-administered medications for this visit.    VITAL SIGNS: LMP 11/01/2018  There were no vitals filed for this visit.  Estimated body mass index is 15.12 kg/m as calculated from the following:   Height as of 05/29/22: 5\' 10"  (1.778 m).   Weight as of 05/29/22: 105 lb 6.4 oz (47.8 kg).   PERFORMANCE STATUS (ECOG) : 1 - Symptomatic but completely ambulatory  CBC    Component Value Date/Time   WBC 5.8 05/31/2022 1016   WBC 5.3 04/23/2022 0559   RBC 3.20 (L) 05/31/2022 1016   HGB 10.1 (L) 05/31/2022 1016   HGB 13.2 09/08/2019 0946   HCT 31.6 (L) 05/31/2022 1016   HCT 40.5 09/08/2019 0946   PLT 131 (L) 05/31/2022 1016   PLT 256 09/08/2019 0946   MCV 98.8 05/31/2022 1016   MCV 97 09/08/2019 0946   MCH 31.6 05/31/2022 1016   MCHC 32.0 05/31/2022 1016   RDW 14.2 05/31/2022 1016   RDW 12.1 09/08/2019 0946   LYMPHSABS 1.0 05/31/2022 1016   MONOABS 0.4 05/31/2022 1016   EOSABS 0.1 05/31/2022 1016   BASOSABS 0.0 05/31/2022 1016      Latest Ref Rng & Units 05/31/2022   10:16 AM 05/15/2022   12:30 PM 04/19/2022    1:30 PM  CMP  Glucose 70 - 99 mg/dL 191  478  295   BUN 6 - 20 mg/dL 26  24  21    Creatinine 0.44 - 1.00 mg/dL 6.21  3.08  6.57   Sodium 135 - 145 mmol/L 140  138  133   Potassium 3.5 - 5.1 mmol/L 3.8  3.8  3.4   Chloride 98 - 111 mmol/L 104  104  98   CO2 22 - 32 mmol/L 29  31  29    Calcium 8.9 - 10.3 mg/dL 9.2  9.1  8.7   Total Protein 6.5 - 8.1 g/dL 6.6  6.5  6.8   Total Bilirubin 0.3 - 1.2 mg/dL 0.3  0.3  0.4   Alkaline Phos 38 - 126 U/L 58  59  66   AST 15 - 41 U/L 14  10  12    ALT 0 - 44 U/L 16  16  13       Physical Exam General: NAD, thin  Cardiovascular: regular rate and rhythm Pulmonary: clear ant fields Abdomen: soft, nontender, + bowel sounds Extremities: no edema, no  joint deformities Skin: no rashes Neurological: AAO x4   IMPRESSION:   Neoplasm related pain  Burnell reports her pain is well  controlled on current regimen. She denies pain at this time due to regimen.    We discussed at length her current regimen: Fentanyl 62 mcg patch changing every 48 hours, hydromorphone 4 mg every 3 hours as needed for breakthrough pain, gabapentin 300 mg 2 times daily 600mg  at bedtime, Cymbalta 30 mg daily, dexamethasone 8 mg daily, mobic 15mg , and lidocaine patch.   We will continue to closely monitor and adjust as needed.    Constipation     Goals of care   05/15/22- Ms. Wos was seen by Dr. Truett Perna on yesterday. She shares plan is to have virtual meeting with Dr. Ledell Peoples today also as discussed on yesterday repeat Guardant 360 to re-evaluate for targeted mutations. She continues to express that she is not ready to accept hospice and is remaining hopeful for further therapy options. Even more hopeful given significant improvement in her quality of life.   05/07/22- We discussed her current illness and what it means in the larger context of her on-going co-morbidities. Natural disease trajectory and expectations were discussed. Patient and family are realistic in their understanding. Atenea speaks to hospice and end-of-life however she is not ready to make this transition or give up on possible treatment options. She and family are planning to connect with Dr. Claudean Severance @ [facility], follow-up with Dr. Truett Perna as needed, and possibly consider other academic institutions that may be able to offer clinical trials or options.   We discussed Her current illness and what it means in the larger context of Her on-going co-morbidities. Natural disease trajectory and expectations were discussed.  I discussed the importance of continued conversation with family and their medical providers regarding overall plan of care and treatment options, ensuring decisions are within the context of the  patients values and GOCs.  PLAN:  Extensive goals of care discussions.  Advanced directives on file.  Ongoing support.  Hydromorphone 4-8 mg every 4 hours as needed for breakthrough pain.   Lidocaine patch Cymbalta 30 mg daily Valium 1 mg every 12 hours as needed Dexamethasone 8 mg daily Fentanyl 62 mcg patch every 48 hours Mobic 15 mg daily Gabapentin 300 mg twice daily and 600 mg at bedtime Provigil 200mg  daily  I will plan to see patient back in 3-4 weeks in collaboration to other oncology appointments.  Patient knows to contact office sooner if needed.   Patient expressed understanding and was in agreement with this plan. She also understands that She can call the clinic at any time with any questions, concerns, or complaints.   Any controlled substances utilized were prescribed in the context of palliative care. PDMP has been reviewed.    Visit consisted of counseling and education dealing with the complex and emotionally intense issues of symptom management and palliative care in the setting of serious and potentially life-threatening illness.Greater than 50%  of this time was spent counseling and coordinating care related to the above assessment and plan.  Willette Alma, AGPCNP-BC  Palliative Medicine Team/[facility]  *Please note that this is a verbal dictation therefore any spelling or grammatical errors are due to the "Dragon Medical One" system interpretation.

## 2022-06-19 ENCOUNTER — Inpatient Hospital Stay (HOSPITAL_BASED_OUTPATIENT_CLINIC_OR_DEPARTMENT_OTHER): Payer: BC Managed Care – PPO | Admitting: Nurse Practitioner

## 2022-06-19 ENCOUNTER — Ambulatory Visit: Payer: BC Managed Care – PPO

## 2022-06-19 ENCOUNTER — Inpatient Hospital Stay: Payer: BC Managed Care – PPO | Admitting: Nurse Practitioner

## 2022-06-19 ENCOUNTER — Encounter: Payer: Self-pay | Admitting: Nurse Practitioner

## 2022-06-19 ENCOUNTER — Other Ambulatory Visit: Payer: Self-pay

## 2022-06-19 ENCOUNTER — Inpatient Hospital Stay: Payer: BC Managed Care – PPO | Attending: Oncology

## 2022-06-19 VITALS — BP 103/73 | HR 89 | Temp 98.1°F | Resp 16 | Ht 70.0 in | Wt 101.4 lb

## 2022-06-19 DIAGNOSIS — D696 Thrombocytopenia, unspecified: Secondary | ICD-10-CM | POA: Insufficient documentation

## 2022-06-19 DIAGNOSIS — R630 Anorexia: Secondary | ICD-10-CM | POA: Diagnosis not present

## 2022-06-19 DIAGNOSIS — F419 Anxiety disorder, unspecified: Secondary | ICD-10-CM | POA: Diagnosis not present

## 2022-06-19 DIAGNOSIS — R530 Neoplastic (malignant) related fatigue: Secondary | ICD-10-CM

## 2022-06-19 DIAGNOSIS — G893 Neoplasm related pain (acute) (chronic): Secondary | ICD-10-CM

## 2022-06-19 DIAGNOSIS — Z515 Encounter for palliative care: Secondary | ICD-10-CM

## 2022-06-19 DIAGNOSIS — C251 Malignant neoplasm of body of pancreas: Secondary | ICD-10-CM | POA: Diagnosis present

## 2022-06-19 DIAGNOSIS — Z79899 Other long term (current) drug therapy: Secondary | ICD-10-CM | POA: Diagnosis not present

## 2022-06-19 DIAGNOSIS — R634 Abnormal weight loss: Secondary | ICD-10-CM

## 2022-06-19 DIAGNOSIS — C787 Secondary malignant neoplasm of liver and intrahepatic bile duct: Secondary | ICD-10-CM | POA: Diagnosis present

## 2022-06-19 LAB — CBC WITH DIFFERENTIAL (CANCER CENTER ONLY)
Abs Immature Granulocytes: 0.02 10*3/uL (ref 0.00–0.07)
Basophils Absolute: 0 10*3/uL (ref 0.0–0.1)
Basophils Relative: 0 %
Eosinophils Absolute: 0 10*3/uL (ref 0.0–0.5)
Eosinophils Relative: 1 %
HCT: 32.3 % — ABNORMAL LOW (ref 36.0–46.0)
Hemoglobin: 10.5 g/dL — ABNORMAL LOW (ref 12.0–15.0)
Immature Granulocytes: 0 %
Lymphocytes Relative: 10 %
Lymphs Abs: 0.5 10*3/uL — ABNORMAL LOW (ref 0.7–4.0)
MCH: 32 pg (ref 26.0–34.0)
MCHC: 32.5 g/dL (ref 30.0–36.0)
MCV: 98.5 fL (ref 80.0–100.0)
Monocytes Absolute: 0.4 10*3/uL (ref 0.1–1.0)
Monocytes Relative: 9 %
Neutro Abs: 3.7 10*3/uL (ref 1.7–7.7)
Neutrophils Relative %: 80 %
Platelet Count: 108 10*3/uL — ABNORMAL LOW (ref 150–400)
RBC: 3.28 MIL/uL — ABNORMAL LOW (ref 3.87–5.11)
RDW: 14.5 % (ref 11.5–15.5)
WBC Count: 4.6 10*3/uL (ref 4.0–10.5)
nRBC: 0 % (ref 0.0–0.2)

## 2022-06-19 LAB — CMP (CANCER CENTER ONLY)
ALT: 8 U/L (ref 0–44)
AST: 9 U/L — ABNORMAL LOW (ref 15–41)
Albumin: 3.7 g/dL (ref 3.5–5.0)
Alkaline Phosphatase: 58 U/L (ref 38–126)
Anion gap: 3 — ABNORMAL LOW (ref 5–15)
BUN: 12 mg/dL (ref 6–20)
CO2: 32 mmol/L (ref 22–32)
Calcium: 9.4 mg/dL (ref 8.9–10.3)
Chloride: 103 mmol/L (ref 98–111)
Creatinine: 0.41 mg/dL — ABNORMAL LOW (ref 0.44–1.00)
GFR, Estimated: >60 mL/min (ref >60)
Glucose, Bld: 124 mg/dL — ABNORMAL HIGH (ref 70–99)
Potassium: 3.5 mmol/L (ref 3.5–5.1)
Sodium: 138 mmol/L (ref 135–145)
Total Bilirubin: 0.4 mg/dL (ref 0.3–1.2)
Total Protein: 6.8 g/dL (ref 6.5–8.1)

## 2022-06-19 MED ORDER — SODIUM CHLORIDE 0.9 % IV SOLN: Freq: Once | INTRAVENOUS | Status: AC

## 2022-06-19 MED ORDER — SODIUM CHLORIDE 0.9% FLUSH
10.0000 mL | Freq: Once | INTRAVENOUS | Status: AC
  Administered 2022-06-19: 10 mL via INTRAVENOUS

## 2022-06-19 MED ORDER — ONDANSETRON HCL 4 MG/2ML IJ SOLN
4.0000 mg | Freq: Once | INTRAMUSCULAR | Status: AC
  Administered 2022-06-19: 4 mg via INTRAVENOUS
  Filled 2022-06-19: qty 2

## 2022-06-19 MED ORDER — HEPARIN SOD (PORK) LOCK FLUSH 100 UNIT/ML IV SOLN
500.0000 [IU] | Freq: Once | INTRAVENOUS | Status: AC
  Administered 2022-06-19: 500 [IU] via INTRAVENOUS

## 2022-06-19 MED ORDER — GRANISETRON 3.1 MG/24HR TD PTCH: MEDICATED_PATCH | TRANSDERMAL | 0 refills | Status: DC

## 2022-06-19 MED ORDER — HYDROMORPHONE HCL 1 MG/ML IJ SOLN
1.0000 mg | Freq: Once | INTRAMUSCULAR | Status: AC
  Administered 2022-06-19: 1 mg via INTRAVENOUS
  Filled 2022-06-19: qty 1

## 2022-06-19 MED ORDER — SODIUM CHLORIDE 0.9 % IV SOLN: 4.0000 mg | Freq: Once | INTRAVENOUS | Status: DC

## 2022-06-19 MED ORDER — DEXAMETHASONE SODIUM PHOSPHATE 10 MG/ML IJ SOLN
4.0000 mg | Freq: Once | INTRAMUSCULAR | Status: AC
  Administered 2022-06-19: 4 mg via INTRAVENOUS
  Filled 2022-06-19: qty 1

## 2022-06-19 NOTE — Progress Notes (Signed)
Tara Jensen last PM reporting acute onset of diarrhea, abdominal pain and nausea. Prescription was sent in for Lomotil, but she has been unable to take most of her oral medications. Increasing severity this AM. I had her added on to our palliative clinic schedule. She is in the office today appearing acutely ill. She is dehydrated with dry skin and mucous membranes. She is very weak and fatigues easily. She had an episode of vomiting during this visit. She is receiving chemoradiation and is Day #7 of Xeloda and XRT.  Provided symptom management focused visit: -IV hydromorphone 1mg  X1 -IV Decadron 4mg  X1 -Normal Saline 500cc Bag -IV Zofran  Patient unable to tolerate radiation tx today.   Briefly discussed goals of care- Tara Jensen has very little reserve to experience severe GI side effects from her chemoradiation treatment plan, she has lost 4 lbs since last vist after gaining weight after hospitalization. Prior to starting chemoradiation she was walking daily, normal PO intake and made a dramatic recovery from a difficult hospitalization. She is reconsidering her desire to continue on with treatment and does not want to continue to take the Xeloda. She wants to know if there is value to continuing radiation without the Xeloda and is asking for guidance on this.  Plan:  Manage her acute symptoms in clinic today. Minimize home oral medication. Bland, easy to digest diet for now Communicate with Dr. Truett Perna re: Xeloda, patient not taking this now. Communicate with Radiation Oncology re: continue tx, pause or discontinue. Labwork pending. Reviewed her medication list, hold supplements until GI tract heals. Add on long acting 5HT patch May consider Octreotide if symptoms continue to get worse. Continue Lomotil PRN

## 2022-06-19 NOTE — Progress Notes (Signed)
Pt came in for IVF for weakness and dehydration. Tolerated infusion well, no complaints. D/c to lobby in stable condition by WC. AVS reviewed, port deaccessed.

## 2022-06-20 ENCOUNTER — Ambulatory Visit: Payer: BC Managed Care – PPO

## 2022-06-20 ENCOUNTER — Other Ambulatory Visit: Payer: Self-pay

## 2022-06-20 ENCOUNTER — Other Ambulatory Visit (HOSPITAL_COMMUNITY): Payer: Self-pay

## 2022-06-20 DIAGNOSIS — R1084 Generalized abdominal pain: Secondary | ICD-10-CM

## 2022-06-20 DIAGNOSIS — Z515 Encounter for palliative care: Secondary | ICD-10-CM

## 2022-06-20 DIAGNOSIS — C251 Malignant neoplasm of body of pancreas: Secondary | ICD-10-CM

## 2022-06-20 NOTE — Progress Notes (Signed)
Per Dr.Golding pt need for IVF/IV medication. IVF visit scheduled, pt made aware. Orders placed under signed and held.

## 2022-06-21 ENCOUNTER — Ambulatory Visit: Payer: BC Managed Care – PPO

## 2022-06-21 ENCOUNTER — Inpatient Hospital Stay: Payer: BC Managed Care – PPO

## 2022-06-21 DIAGNOSIS — R1084 Generalized abdominal pain: Secondary | ICD-10-CM

## 2022-06-21 DIAGNOSIS — C251 Malignant neoplasm of body of pancreas: Secondary | ICD-10-CM

## 2022-06-21 DIAGNOSIS — Z515 Encounter for palliative care: Secondary | ICD-10-CM

## 2022-06-21 MED ORDER — SODIUM CHLORIDE 0.9 % IV SOLN: Freq: Once | INTRAVENOUS | Status: AC

## 2022-06-21 NOTE — Progress Notes (Signed)
Patient declined IV hydromorphone, IV Decadron, IV Zofran orders. She states she "feels great" and would only like to continue with bolus today.

## 2022-06-21 NOTE — Patient Instructions (Signed)

## 2022-06-22 ENCOUNTER — Other Ambulatory Visit: Payer: Self-pay | Admitting: Internal Medicine

## 2022-06-22 DIAGNOSIS — C251 Malignant neoplasm of body of pancreas: Secondary | ICD-10-CM

## 2022-06-22 MED ORDER — HYDROMORPHONE HCL 4 MG PO TABS
4.0000 mg | ORAL_TABLET | ORAL | 0 refills | Status: DC | PRN
Start: 2022-06-22 — End: 2022-07-26

## 2022-06-24 ENCOUNTER — Inpatient Hospital Stay: Payer: BC Managed Care – PPO

## 2022-06-24 ENCOUNTER — Ambulatory Visit: Payer: BC Managed Care – PPO

## 2022-06-24 ENCOUNTER — Inpatient Hospital Stay: Payer: BC Managed Care – PPO | Admitting: Nurse Practitioner

## 2022-06-25 ENCOUNTER — Ambulatory Visit: Payer: BC Managed Care – PPO

## 2022-06-25 ENCOUNTER — Encounter: Payer: Self-pay | Admitting: Oncology

## 2022-06-26 ENCOUNTER — Ambulatory Visit: Payer: BC Managed Care – PPO

## 2022-06-26 ENCOUNTER — Telehealth: Payer: Self-pay | Admitting: Radiation Oncology

## 2022-06-26 NOTE — Telephone Encounter (Signed)
I called the patient back as we've been trying to reach eachother but had to leave another voicemail.

## 2022-06-27 ENCOUNTER — Telehealth: Payer: Self-pay | Admitting: Radiation Oncology

## 2022-06-27 ENCOUNTER — Ambulatory Visit: Payer: BC Managed Care – PPO

## 2022-06-27 ENCOUNTER — Other Ambulatory Visit: Payer: Self-pay | Admitting: Internal Medicine

## 2022-06-27 DIAGNOSIS — G893 Neoplasm related pain (acute) (chronic): Secondary | ICD-10-CM

## 2022-06-27 DIAGNOSIS — C251 Malignant neoplasm of body of pancreas: Secondary | ICD-10-CM

## 2022-06-27 DIAGNOSIS — Z515 Encounter for palliative care: Secondary | ICD-10-CM

## 2022-06-27 MED ORDER — FENTANYL 50 MCG/HR TD PT72
1.0000 | MEDICATED_PATCH | TRANSDERMAL | 0 refills | Status: DC
Start: 2022-06-27 — End: 2022-07-25

## 2022-06-27 MED ORDER — FENTANYL 12 MCG/HR TD PT72
1.0000 | MEDICATED_PATCH | TRANSDERMAL | 0 refills | Status: DC
Start: 2022-06-27 — End: 2022-07-25

## 2022-06-27 NOTE — Telephone Encounter (Signed)
LM again for pt. We've been having a hard time getting in touch with the patient due to phone connectivity.

## 2022-06-27 NOTE — Telephone Encounter (Signed)
I spoke with the patient after she and I have been trying to communicate together. She states that she had a hard time with nausea and vomiting last week after having been taking xeloda and radiation. She was counseled on dose adjustement in xeloda, but ultimately stopped both radiation and xeloda after her treatment last Tuesday. She reports by last Saturday she started feeling better, and decided to hold on any further therapy until we spoke.   At the conclusion of our discussion we reviewed the intention of palliative radiotherapy would be to minimize future pain, and while not expected to shrink, the possibility that the tumor might stabilize for a few months. However we did cover my suspicion that even with this therapy she likely has less than 6 months of life expectancy given that her nutritional status has suffered greatly and she is only about 96 pounds currently. She was surprised to hear this and wanted to discuss this as well with Dr. Phillips Odor. After considering these concerns and goals, she is open to continuing radiation alone to see if this is tolerable enough to finish, but is also aware that we would support her decision if she decides she does not want to finish radiation.

## 2022-06-28 ENCOUNTER — Ambulatory Visit: Payer: BC Managed Care – PPO

## 2022-06-28 ENCOUNTER — Ambulatory Visit: Admission: RE | Admit: 2022-06-28 | Payer: BC Managed Care – PPO | Source: Ambulatory Visit

## 2022-06-28 LAB — GUARDANT 360

## 2022-07-01 ENCOUNTER — Ambulatory Visit: Payer: BC Managed Care – PPO

## 2022-07-01 NOTE — Radiation Completion Notes (Signed)
  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   Intent: Palliative     ==========DELIVERED PLANS==========  First Treatment Date: 2022-06-03 - Last Treatment Date: 2022-06-18   Plan Name: Abd Site: Abdomen Technique: 3D Mode: Photon Dose Per Fraction: 2.5 Gy Prescribed Dose (Delivered / Prescribed): 17.5 Gy / 37.5 Gy Prescribed Fxs (Delivered / Prescribed): 7 / 15     ==========ON TREATMENT VISIT DATES========== 2022-06-14       See weekly On Treatment Notes is Epic for details. The patient tolerated radiation. She developed fatigue and significant nausea and vomiting. This was felt to be likely related to Xeloda, and dose modifications were suggested by medical oncology. She discontinued radiation and the Xeloda and took 4 days to recover from her symptoms. After discussions about therapy continuing or discontinuing, she decided to cancel her remaining treatments.   The patient will receive a call in about one month from the radiation oncology department. She will continue follow up with Dr. Truett Perna and Dr. Phillips Odor as well.      Osker Mason, PAC

## 2022-07-02 ENCOUNTER — Ambulatory Visit: Payer: BC Managed Care – PPO

## 2022-07-03 ENCOUNTER — Ambulatory Visit: Payer: BC Managed Care – PPO

## 2022-07-04 ENCOUNTER — Inpatient Hospital Stay: Payer: BC Managed Care – PPO

## 2022-07-04 ENCOUNTER — Ambulatory Visit: Payer: BC Managed Care – PPO

## 2022-07-04 ENCOUNTER — Encounter: Payer: Self-pay | Admitting: *Deleted

## 2022-07-04 ENCOUNTER — Inpatient Hospital Stay (HOSPITAL_BASED_OUTPATIENT_CLINIC_OR_DEPARTMENT_OTHER): Payer: BC Managed Care – PPO | Admitting: Oncology

## 2022-07-04 VITALS — BP 104/62 | HR 79 | Temp 98.1°F | Resp 18 | Ht 70.0 in | Wt 104.0 lb

## 2022-07-04 DIAGNOSIS — Z95828 Presence of other vascular implants and grafts: Secondary | ICD-10-CM

## 2022-07-04 DIAGNOSIS — C251 Malignant neoplasm of body of pancreas: Secondary | ICD-10-CM

## 2022-07-04 LAB — CMP (CANCER CENTER ONLY)
ALT: 13 U/L (ref 0–44)
AST: 11 U/L — ABNORMAL LOW (ref 15–41)
Albumin: 3.8 g/dL (ref 3.5–5.0)
Alkaline Phosphatase: 51 U/L (ref 38–126)
Anion gap: 7 (ref 5–15)
BUN: 26 mg/dL — ABNORMAL HIGH (ref 6–20)
CO2: 27 mmol/L (ref 22–32)
Calcium: 9 mg/dL (ref 8.9–10.3)
Chloride: 106 mmol/L (ref 98–111)
Creatinine: 0.43 mg/dL — ABNORMAL LOW (ref 0.44–1.00)
GFR, Estimated: >60 mL/min (ref >60)
Glucose, Bld: 128 mg/dL — ABNORMAL HIGH (ref 70–99)
Potassium: 4 mmol/L (ref 3.5–5.1)
Sodium: 140 mmol/L (ref 135–145)
Total Bilirubin: 0.2 mg/dL — ABNORMAL LOW (ref 0.3–1.2)
Total Protein: 6.6 g/dL (ref 6.5–8.1)

## 2022-07-04 LAB — CBC WITH DIFFERENTIAL (CANCER CENTER ONLY)
Abs Immature Granulocytes: 0.06 10*3/uL (ref 0.00–0.07)
Basophils Absolute: 0 10*3/uL (ref 0.0–0.1)
Basophils Relative: 0 %
Eosinophils Absolute: 0 10*3/uL (ref 0.0–0.5)
Eosinophils Relative: 0 %
HCT: 30.7 % — ABNORMAL LOW (ref 36.0–46.0)
Hemoglobin: 9.8 g/dL — ABNORMAL LOW (ref 12.0–15.0)
Immature Granulocytes: 1 %
Lymphocytes Relative: 6 %
Lymphs Abs: 0.4 10*3/uL — ABNORMAL LOW (ref 0.7–4.0)
MCH: 31.6 pg (ref 26.0–34.0)
MCHC: 31.9 g/dL (ref 30.0–36.0)
MCV: 99 fL (ref 80.0–100.0)
Monocytes Absolute: 0.4 10*3/uL (ref 0.1–1.0)
Monocytes Relative: 5 %
Neutro Abs: 6.2 10*3/uL (ref 1.7–7.7)
Neutrophils Relative %: 88 %
Platelet Count: 115 10*3/uL — ABNORMAL LOW (ref 150–400)
RBC: 3.1 MIL/uL — ABNORMAL LOW (ref 3.87–5.11)
RDW: 15 % (ref 11.5–15.5)
WBC Count: 7.1 10*3/uL (ref 4.0–10.5)
nRBC: 0 % (ref 0.0–0.2)

## 2022-07-04 MED ORDER — SODIUM CHLORIDE 0.9% FLUSH
10.0000 mL | Freq: Once | INTRAVENOUS | Status: AC
  Administered 2022-07-04: 10 mL via INTRAVENOUS

## 2022-07-04 MED ORDER — HEPARIN SOD (PORK) LOCK FLUSH 100 UNIT/ML IV SOLN
500.0000 [IU] | Freq: Once | INTRAVENOUS | Status: AC
  Administered 2022-07-04: 500 [IU] via INTRAVENOUS

## 2022-07-04 NOTE — Progress Notes (Signed)
Tara Jensen   Diagnosis: Pancreas cancer  INTERVAL HISTORY:   Tara Jensen returns for a scheduled visit.  She currently feels well.  She reports good pain relief with the current narcotic regimen.  She takes Dilaudid 3-4 times per day. She began daily radiation and Xeloda on 06/10/2022.  She discontinued treatment after day 7 due to severe nausea and she reports 1 episode of diarrhea.  She received intravenous fluids and antiemetics 06/19/2022.  She now feels much better. She was started on apixaban anticoagulation while hospitalized in March.  She denies bleeding. Objective:  Vital signs in last 24 hours:  Blood pressure 104/62, pulse 79, temperature 98.1 F (36.7 C), temperature source Oral, resp. rate 18, height 5\' 10"  (1.778 m), weight 104 lb (47.2 kg), last menstrual period 11/01/2018, SpO2 100 %.    HEENT: No thrush or ulcers Resp: Lungs clear bilaterally Cardio: Regular rate and rhythm GI: Soft and nontender, no mass Vascular: No leg edema  Portacath/PICC-without erythema  Lab Results:  Lab Results  Component Value Date   WBC 7.1 07/04/2022   HGB 9.8 (L) 07/04/2022   HCT 30.7 (L) 07/04/2022   MCV 99.0 07/04/2022   PLT 115 (L) 07/04/2022   NEUTROABS 6.2 07/04/2022    CMP  Lab Results  Component Value Date   NA 138 06/19/2022   K 3.5 06/19/2022   CL 103 06/19/2022   CO2 32 06/19/2022   GLUCOSE 124 (H) 06/19/2022   BUN 12 06/19/2022   CREATININE 0.41 (L) 06/19/2022   CALCIUM 9.4 06/19/2022   PROT 6.8 06/19/2022   ALBUMIN 3.7 06/19/2022   AST 9 (L) 06/19/2022   ALT 8 06/19/2022   ALKPHOS 58 06/19/2022   BILITOT 0.4 06/19/2022   GFRNONAA >60 06/19/2022   GFRAA  12/27/2021    QUESTIONABLE RESULTS, RECOMMEND RECOLLECT TO VERIFY    Lab Results  Component Value Date   CAN199 3,523 (H) 05/31/2022    Lab Results  Component Value Date   INR 1.0 04/16/2022   LABPROT 13.3 04/16/2022    Imaging:  No results  found.  Medications: I have reviewed the patient's current medications.   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  Less than 180 degree abutment of the distal celiac, common hepatic, and splenic arteries, less than 180 degree abutment of the SMA CT chest 07/18/2021-no evidence of metastatic disease EUS 07/16/2021-extrinsic impression on the stomach at the posterior wall of the gastric body, no gross lesion in the duodenum, 45 x 36 mm pancreas body mass, abutment of the SMA, celiac trunk, and compression of the splenoportal confluence.  No malignant appearing lymph nodes, numerous venous collaterals adjacent to the portal vein, FNA biopsy-malignant cells consistent with adenocarcinoma, T4N0 by EUS Markedly elevated CA 19-9 Guardant360 07/31/2021-K-ras G12R, MSI high-not detected, ATM VUS Cycle 1 FOLFOX 08/08/2021 Cycle 2 FOLFIRINOX 08/22/2021 CT [facility] 08/31/2021-hypoattenuating liver lesions consistent with metastases, mass in the pancreas neck/body with greater than 100 degree encasement of the celiac axis and common hepatic artery.  Less than 180 degree abutment of the SMA, long segment occlusion of the portal splenic confluence, splenic vein occluded, multiple collateral vessels in the upper abdomen, prominent left periaortic node Cycle 3 FOLFIRINOX 09/05/2021 Cycle 4 FOLFIRINOX 09/19/2021 Cycle 5 FOLFIRINOX 10/03/2021 CTs 10/16/2021-decrease size of primary pancreas mass,  hepatic metastases, and a suspicious retroperitoneal node, resolution of left supraclavicular/low jugular adenopathy compared to a CT chest 07/18/2021.  No evidence of disease progression. Cycle 6 FOLFIRINOX 10/17/2021 Cycle 7 FOLFIRINOX 10/31/2021 Cycle 8 FOLFIRINOX 11/14/2021 Cycle 9 FOLFIRINOX  11/28/2021 Cycle 10 FOLFIRINOX 12/12/2021, oxaliplatin held secondary to neuropathy symptoms CTs 12/20/2021-decrease size of pancreas primary, no evidence of hepatic metastases, no new or progressive disease Cycle 11 FOLFIRINOX 12/27/2021, oxaliplatin held due to neuropathy symptoms Cycle 12 FOLFIRINOX 01/09/2022, oxaliplatin held 01/22/2022-C19-9 higher, 530 Cycle 13 FOLFIRINOX 01/24/2022, oxaliplatin resumed Cycle 14 FOLFIRINOX 02/06/2022 CTs 02/23/2022-increased size of the pancreas body mass new focal gastric wall thickening and loss of fat plane between the mass and the proximal gastric wall, no evidence of new or progressive metastatic disease Cycle 1 gemcitabine/Abraxane 03/06/2022 Cycle 2 gemcitabine/Abraxane 03/20/2022 CT abdomen/pelvis was 03/20/2022-slight interval increase in size of the pancreas neck/body mass with extension to the posterior gastric antrum Cycle 3 gemcitabine/Abraxane 04/04/2022-dose reduction secondary to thrombocytopenia CT abdomen/pelvis 04/14/2021-increase size of the pancreas neck/body mass with probable involvement of the gastric wall, progressive encasement of the celiac artery with chronic involvement of the portal splenic confluence MRI abdomen 05/24/2022-increase in size of the pancreas neck/body mass with vascular encasement and new thrombus in the extrahepatic portal vein, new small peripheral enhancing structure in segment 2 of the liver concerning for a metastasis, trace ascites, large stool burden Xeloda/radiation 06/10/2022, discontinued after 7 treatments secondary to severe nausea Pain secondary to #1-Dilaudid drip started 04/18/2022, converted to a fentanyl drip followed by a fentanyl patch and oral Dilaudid for breakthrough pain Celiac plexus block 03/11/2022 Celiac plexus block 04/22/2022 Weight loss secondary to #1 History of situational depression Family history of breast cancer 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      Disposition: Tara Jensen has pancreas cancer.  She completed a partial course of palliative Xeloda/radiation beginning 06/10/2022.  Treatment was discontinued secondary to development of severe nausea.  I suspect the nausea was secondary to radiation.  She is followed by palliative care medicine.  Her pain is under good control with the current narcotic regimen.  She continues Decadron and meloxicam.  She will discuss the indication for continue meloxicam and the possibility of decreasing the Decadron dose with Dr. Phillips Odor.  She was placed on apixaban anticoagulation while hospitalized in March.  I think it is reasonable to continue apixaban given her high risk of thrombotic disease (has imaging evidence of portal vein thrombus), but she is also at risk for bleeding with tumor potentially involving the stomach.  She will discuss discontinuing apixaban with Dr. Phillips Odor.  Tara Jensen is interested in a clinical trial.  She may be eligible for a pan KRAS inhibitor trial at [facility].  We will submit her name to the protocol office.  Tara Jensen will return for an office visit in 1 month.  Thornton Papas, MD  07/04/2022  10:19 AM

## 2022-07-04 NOTE — Patient Instructions (Signed)

## 2022-07-05 ENCOUNTER — Ambulatory Visit: Payer: BC Managed Care – PPO

## 2022-07-05 LAB — CANCER ANTIGEN 19-9: CA 19-9: 4347 U/mL — ABNORMAL HIGH (ref 0–35)

## 2022-07-08 ENCOUNTER — Ambulatory Visit: Payer: BC Managed Care – PPO

## 2022-07-09 ENCOUNTER — Ambulatory Visit: Payer: BC Managed Care – PPO

## 2022-07-09 ENCOUNTER — Other Ambulatory Visit: Payer: Self-pay | Admitting: Nurse Practitioner

## 2022-07-09 DIAGNOSIS — Z515 Encounter for palliative care: Secondary | ICD-10-CM

## 2022-07-09 DIAGNOSIS — C251 Malignant neoplasm of body of pancreas: Secondary | ICD-10-CM

## 2022-07-09 DIAGNOSIS — R530 Neoplastic (malignant) related fatigue: Secondary | ICD-10-CM

## 2022-07-10 ENCOUNTER — Ambulatory Visit: Payer: BC Managed Care – PPO

## 2022-07-12 ENCOUNTER — Other Ambulatory Visit: Payer: Self-pay | Admitting: Internal Medicine

## 2022-07-15 ENCOUNTER — Other Ambulatory Visit: Payer: Self-pay | Admitting: Nurse Practitioner

## 2022-07-15 ENCOUNTER — Other Ambulatory Visit: Payer: Self-pay | Admitting: Internal Medicine

## 2022-07-15 ENCOUNTER — Encounter: Payer: Self-pay | Admitting: Internal Medicine

## 2022-07-15 DIAGNOSIS — C251 Malignant neoplasm of body of pancreas: Secondary | ICD-10-CM

## 2022-07-15 DIAGNOSIS — Z515 Encounter for palliative care: Secondary | ICD-10-CM

## 2022-07-15 DIAGNOSIS — R1084 Generalized abdominal pain: Secondary | ICD-10-CM

## 2022-07-15 DIAGNOSIS — G893 Neoplasm related pain (acute) (chronic): Secondary | ICD-10-CM

## 2022-07-15 MED ORDER — PANCRELIPASE (LIP-PROT-AMYL) 3000-9500 UNITS PO CPEP: 2.0000 | ORAL_CAPSULE | Freq: Two times a day (BID) | ORAL | 3 refills | Status: DC

## 2022-07-16 NOTE — Telephone Encounter (Signed)
Requested medication (s) are due for refill today: yes  Requested medication (s) are on the active medication list: yes  Last refill:  06/22/22  Future visit scheduled: unknown  Notes to clinic:  Unable to refill per protocol, cannot delegate. Unable to refuse medication.      Requested Prescriptions  Pending Prescriptions Disp Refills   HYDROmorphone (DILAUDID) 4 MG tablet [Pharmacy Med Name: hydromorphone 4 mg tablet] 120 tablet 0    Sig: Take 1 tablet (4 mg total) by mouth every 3 (three) hours as needed for severe pain.     There is no refill protocol information for this order

## 2022-07-18 ENCOUNTER — Other Ambulatory Visit: Payer: Self-pay | Admitting: Internal Medicine

## 2022-07-18 DIAGNOSIS — C251 Malignant neoplasm of body of pancreas: Secondary | ICD-10-CM

## 2022-07-18 MED ORDER — HYDROMORPHONE HCL 4 MG PO TABS
4.0000 mg | ORAL_TABLET | ORAL | 0 refills | Status: DC | PRN
Start: 2022-07-18 — End: 2022-07-29

## 2022-07-24 ENCOUNTER — Ambulatory Visit (HOSPITAL_COMMUNITY): Payer: BC Managed Care – PPO

## 2022-07-25 ENCOUNTER — Ambulatory Visit (HOSPITAL_COMMUNITY)
Admission: RE | Admit: 2022-07-25 | Discharge: 2022-07-25 | Disposition: A | Discharge status: Home / Self Care | Payer: BC Managed Care – PPO | Source: Ambulatory Visit | Attending: Nurse Practitioner | Admitting: Nurse Practitioner

## 2022-07-25 ENCOUNTER — Other Ambulatory Visit: Payer: Self-pay | Admitting: Internal Medicine

## 2022-07-25 DIAGNOSIS — G893 Neoplasm related pain (acute) (chronic): Secondary | ICD-10-CM | POA: Diagnosis present

## 2022-07-25 DIAGNOSIS — Z515 Encounter for palliative care: Secondary | ICD-10-CM | POA: Insufficient documentation

## 2022-07-25 DIAGNOSIS — R1084 Generalized abdominal pain: Secondary | ICD-10-CM | POA: Diagnosis present

## 2022-07-25 DIAGNOSIS — C251 Malignant neoplasm of body of pancreas: Secondary | ICD-10-CM | POA: Diagnosis present

## 2022-07-25 MED ORDER — FENTANYL 100 MCG/HR TD PT72: 1.0000 | MEDICATED_PATCH | TRANSDERMAL | 0 refills | Status: DC

## 2022-07-25 MED ORDER — IOHEXOL 9 MG/ML PO SOLN: 500.0000 mL | Freq: Once | ORAL | Status: DC

## 2022-07-25 NOTE — Progress Notes (Signed)
Patient has had gradual worsening of her cancer related abdominal and back pain. Likely disease progression and opioid tolerance contributing. She also struggles to take multiple pills daily and may be falling behind on PRNs. Will increase her current dose by 50%-->script sent in for q48 hours.  Anderson Malta, DO Palliative Medicine

## 2022-07-26 ENCOUNTER — Other Ambulatory Visit: Payer: Self-pay | Admitting: Oncology

## 2022-07-26 DIAGNOSIS — C251 Malignant neoplasm of body of pancreas: Secondary | ICD-10-CM

## 2022-07-29 ENCOUNTER — Inpatient Hospital Stay (HOSPITAL_BASED_OUTPATIENT_CLINIC_OR_DEPARTMENT_OTHER): Payer: BC Managed Care – PPO | Admitting: Oncology

## 2022-07-29 ENCOUNTER — Inpatient Hospital Stay: Payer: BC Managed Care – PPO

## 2022-07-29 ENCOUNTER — Inpatient Hospital Stay: Payer: BC Managed Care – PPO | Attending: Oncology | Admitting: Oncology

## 2022-07-29 DIAGNOSIS — I81 Portal vein thrombosis: Secondary | ICD-10-CM | POA: Diagnosis not present

## 2022-07-29 DIAGNOSIS — C787 Secondary malignant neoplasm of liver and intrahepatic bile duct: Secondary | ICD-10-CM | POA: Diagnosis present

## 2022-07-29 DIAGNOSIS — C251 Malignant neoplasm of body of pancreas: Secondary | ICD-10-CM

## 2022-07-29 DIAGNOSIS — Z9221 Personal history of antineoplastic chemotherapy: Secondary | ICD-10-CM | POA: Insufficient documentation

## 2022-07-29 DIAGNOSIS — Z803 Family history of malignant neoplasm of breast: Secondary | ICD-10-CM | POA: Insufficient documentation

## 2022-07-29 DIAGNOSIS — Z7901 Long term (current) use of anticoagulants: Secondary | ICD-10-CM | POA: Insufficient documentation

## 2022-07-29 MED ORDER — LIDOCAINE-PRILOCAINE 2.5-2.5 % EX CREA
1.0000 | TOPICAL_CREAM | CUTANEOUS | 2 refills | Status: DC | PRN
Start: 2022-07-29 — End: 2022-09-24

## 2022-07-29 NOTE — Progress Notes (Signed)
[facility] OFFICE PROGRESS NOTE   Diagnosis: Pancreas cancer    INTERVAL HISTORY:   Tara Jensen returns as scheduled.  She continues follow-up with Dr. Phillips Odor for symptom management.  She is on a Duragesic patch, Dilaudid, Decadron, gabapentin, and meloxicam.  She continues apixaban anticoagulation. She reports adequate pain control with the current narcotic regimen.  She is having bowel movements. Objective:  Vital signs in last 24 hours:  Blood pressure 116/60, pulse 81, temperature 98.2 F (36.8 C), temperature source Oral, resp. rate 18, height 5\' 10"  (1.778 m), weight 104 lb 9.6 oz (47.4 kg), last menstrual period 11/01/2018, SpO2 100 %.     Resp: Lungs clear bilaterally Cardio: Regular rate and rhythm GI: No apparent ascites, firm masslike fullness in the mid upper abdomen Vascular: No leg edema    Portacath/PICC-without erythema  Lab Results:  Lab Results  Component Value Date   WBC 7.1 07/04/2022   HGB 9.8 (L) 07/04/2022   HCT 30.7 (L) 07/04/2022   MCV 99.0 07/04/2022   PLT 115 (L) 07/04/2022   NEUTROABS 6.2 07/04/2022    CMP  Lab Results  Component Value Date   NA 140 07/04/2022   K 4.0 07/04/2022   CL 106 07/04/2022   CO2 27 07/04/2022   GLUCOSE 128 (H) 07/04/2022   BUN 26 (H) 07/04/2022   CREATININE 0.43 (L) 07/04/2022   CALCIUM 9.0 07/04/2022   PROT 6.6 07/04/2022   ALBUMIN 3.8 07/04/2022   AST 11 (L) 07/04/2022   ALT 13 07/04/2022   ALKPHOS 51 07/04/2022   BILITOT 0.2 (L) 07/04/2022   GFRNONAA >60 07/04/2022   GFRAA  12/27/2021    QUESTIONABLE RESULTS, RECOMMEND RECOLLECT TO VERIFY    Lab Results  Component Value Date   CAN199 4,347 (H) 07/04/2022    Lab Results  Component Value Date   INR 1.0 04/16/2022   LABPROT 13.3 04/16/2022    Imaging:  CT Abdomen Pelvis Wo Contrast  Result Date: 07/29/2022 CLINICAL DATA:  History of pancreatic adenocarcinoma EXAM: CT ABDOMEN AND PELVIS WITHOUT CONTRAST TECHNIQUE:  Multidetector CT imaging of the abdomen and pelvis was performed following the standard protocol without IV contrast. RADIATION DOSE REDUCTION: This exam was performed according to the departmental dose-optimization program which includes automated exposure control, adjustment of the mA and/or kV according to patient size and/or use of iterative reconstruction technique. COMPARISON:  MR abdomen dated 05/24/2022, CT abdomen and pelvis dated 04/15/2022 FINDINGS: Lower chest: 2 mm right lower lobe nodules (3:11, 34) and left lower lobe nodule (3:36). No pleural effusion or pneumothorax demonstrated. Partially imaged heart size is normal. Hepatobiliary: Interval increased size of hypoattenuating focus within segment 2 measuring 3.0 x 1.8 cm (2:19), previously 2.2 x 1.2 cm (remeasured). Additional new hypoattenuating foci within peripheral segment 2 measures 1.5 x 1.2 cm (2:17) and posterior segment 6 measures 0.9 x 0.7 cm. Apparent adjacent hypoattenuating focus along peripheral segment 6 corresponds to volume averaging artifact. No intra or extrahepatic biliary ductal dilation. Gallbladder is contracted. Pancreas: Interval increase in size of known pancreatic mass measuring 8.4 x 5.6 cm (2:29), previously 7.4 x 4.8 cm. The mass is inseparable from the posterior gastric antrum. Previously noted vessel involvement is suboptimally evaluated on this noncontrast examination. Spleen: Normal in size without focal abnormality. Adrenals/Urinary Tract: No adrenal nodules. No suspicious renal mass or hydronephrosis. Right upper pole stone measures 5 mm. Additional punctate nonobstructing left renal stones. No focal bladder wall thickening. Stomach/Bowel: Normal appearance of the stomach. No  evidence of bowel wall thickening, distention, or inflammatory changes. Appendix is not discretely seen. Vascular/Lymphatic: Abdominal varices. No enlarged abdominal or pelvic lymph nodes. Reproductive: No adnexal masses. Other: Small volume  ascites.  No free air or fluid collection. Musculoskeletal: No acute or abnormal lytic or blastic osseous lesions. IMPRESSION: 1. Interval increase in size of known pancreatic mass measuring 8.4 x 5.6 cm, previously 7.4 x 4.8 cm. The mass remains inseparable from the posterior gastric antrum. Previously noted vessel involvement is suboptimally evaluated on this noncontrast examination. 2. New and enlarging hepatic lesions, suspicious for metastatic disease. 3. Small volume ascites. 4. Two 2 mm right lower lobe and left lower lobe pulmonary nodules, nonspecific. Electronically Signed   By: Agustin Cree M.D.   On: 07/29/2022 09:34    Medications: I have reviewed the patient's current medications.   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  Less than 180 degree abutment of the distal celiac, common hepatic, and splenic arteries, less than 180 degree abutment of the SMA CT chest 07/18/2021-no evidence of metastatic disease EUS 07/16/2021-extrinsic impression on the stomach at the posterior wall of the gastric body, no gross lesion in the duodenum, 45 x 36 mm pancreas body mass, abutment of the SMA, celiac trunk, and compression of the splenoportal confluence.  No malignant appearing lymph nodes, numerous venous collaterals adjacent to the portal vein, FNA biopsy-malignant cells consistent with adenocarcinoma, T4N0 by EUS Markedly elevated CA 19-9 Guardant360 07/31/2021-K-ras G12R, MSI high-not detected, ATM VUS Cycle 1 FOLFOX 08/08/2021 Cycle 2 FOLFIRINOX 08/22/2021 CT abdomen[facility]pelvis at [facility] 08/31/2021-hypoattenuating liver lesions consistent with metastases, mass in the pancreas neck/body with greater than 100 degree encasement of the celiac axis and common hepatic artery.  Less than 180 degree  abutment of the SMA, long segment occlusion of the portal splenic confluence, splenic vein occluded, multiple collateral vessels in the upper abdomen, prominent left periaortic node Cycle 3 FOLFIRINOX 09/05/2021 Cycle 4 FOLFIRINOX 09/19/2021 Cycle 5 FOLFIRINOX 10/03/2021 CTs 10/16/2021-decrease size of primary pancreas mass, hepatic metastases, and a suspicious retroperitoneal node, resolution of left supraclavicular/low jugular adenopathy compared to a CT chest 07/18/2021.  No evidence of disease progression. Cycle 6 FOLFIRINOX 10/17/2021 Cycle 7 FOLFIRINOX 10/31/2021 Cycle 8 FOLFIRINOX 11/14/2021 Cycle 9 FOLFIRINOX 11/28/2021 Cycle 10 FOLFIRINOX 12/12/2021, oxaliplatin held secondary to neuropathy symptoms CTs 12/20/2021-decrease size of pancreas primary, no evidence of hepatic metastases, no new or progressive disease Cycle 11 FOLFIRINOX 12/27/2021, oxaliplatin held due to neuropathy symptoms Cycle 12 FOLFIRINOX 01/09/2022, oxaliplatin held 01/22/2022-C19-9 higher, 530 Cycle 13 FOLFIRINOX 01/24/2022, oxaliplatin resumed Cycle 14 FOLFIRINOX 02/06/2022 CTs 02/23/2022-increased size of the pancreas body mass new focal gastric wall thickening and loss of fat plane between the mass and the proximal gastric wall, no evidence of new or progressive metastatic disease Cycle 1 gemcitabine/Abraxane 03/06/2022 Cycle 2 gemcitabine/Abraxane 03/20/2022 CT abdomen/pelvis was 03/20/2022-slight interval increase in size of the pancreas neck/body mass with extension to the posterior gastric antrum Cycle 3 gemcitabine/Abraxane 04/04/2022-dose reduction secondary to thrombocytopenia CT abdomen/pelvis 04/14/2021-increase size of the pancreas neck/body mass with probable involvement of the gastric wall, progressive encasement of the celiac artery with chronic involvement of the portal splenic confluence MRI abdomen 05/24/2022-increase in size of the pancreas neck/body mass with vascular encasement and new thrombus in the extrahepatic  portal vein, new small peripheral enhancing structure in segment 2 of the  liver concerning for a metastasis, trace ascites, large stool burden Xeloda/radiation 06/10/2022, discontinued after 7 treatments secondary to severe nausea CT abdomen/pelvis 07/25/2022-increased pancreas mass-inseparable from the posterior gastric antrum, new and enlarging hepatic metastases, small volume ascites, small lower lung nodules-indeterminate Pain secondary to #1-Dilaudid drip started 04/18/2022, converted to a fentanyl drip followed by a fentanyl patch and oral Dilaudid for breakthrough pain Celiac plexus block 03/11/2022 Celiac plexus block 04/22/2022 Weight loss secondary to #1 History of situational depression Family history of breast cancer 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       Disposition: Tara Jensen has pancreas cancer.  She is off of specific therapy for pancreas cancer.  She continues follow-up with palliative care medicine for pain and symptom management.  She is on apixaban anticoagulation after she was found to have portal vein thrombosis several months ago.  We discussed the indication for anticoagulation therapy.  She is at high risk for venous thromboembolic disease with the diagnosis of metastatic pancreas cancer, but she is also at increased risk for bleeding if tumor invades the stomach.  She will continue apixaban for now.  Tara Jensen will return for an office visit and Port-A-Cath flush in 6-8 weeks.  We referred her to [facility] to consider a K-ras inhibitor trial.  We will follow-up on the study status.  I reviewed the CT images with her.  Thornton Papas, MD  07/29/2022  10:31 AM

## 2022-07-30 ENCOUNTER — Other Ambulatory Visit: Payer: Self-pay | Admitting: *Deleted

## 2022-07-31 LAB — CANCER ANTIGEN 19-9: CA 19-9: 7676 U/mL — ABNORMAL HIGH (ref 0–35)

## 2022-08-15 ENCOUNTER — Other Ambulatory Visit: Payer: Self-pay | Admitting: Internal Medicine

## 2022-08-15 ENCOUNTER — Encounter: Payer: Self-pay | Admitting: Internal Medicine

## 2022-08-15 DIAGNOSIS — C251 Malignant neoplasm of body of pancreas: Secondary | ICD-10-CM

## 2022-08-15 MED ORDER — HYDROMORPHONE HCL 4 MG PO TABS
4.0000 mg | ORAL_TABLET | ORAL | 0 refills | Status: DC | PRN
Start: 2022-08-15 — End: 2022-08-15

## 2022-08-15 MED ORDER — HYDROMORPHONE HCL 4 MG PO TABS
4.0000 mg | ORAL_TABLET | ORAL | 0 refills | Status: AC | PRN
Start: 2022-08-15 — End: 2022-09-14

## 2022-08-15 NOTE — Addendum Note (Signed)
Addended by: Edsel Petrin on: 08/15/2022 09:27 AM   Modules accepted: Orders

## 2022-08-15 NOTE — Progress Notes (Signed)
Follow-up visit today for pain and symptom management. Doing well on her current regimen, but endorses some dizziness and possible cognitive side effects from her opioids. Her pain is mostly localizing in her mid back-she still needs regular prn dosing of hydromorphone. She is managing constipation much better, but does experience bloating and discomfort intermittently. She is gaining weight. We discussed pancreatic enzymes to help with weight gain and food tolerance.   I provided supportive and empathetic presence along with further discussion about her goals of care and her cancer journey in general. We discussed the results of her labs and recent CT, her visit with oncology and her plans to see a specialist at [facility] later this week. Tara Jensen is coping very well in these circumstances-she is doing her best to live her life and make meaningful connections with others as she navigates the uncertainty of the future.  Will follow up as needed and provide medication refills today. No changes other than starting enzymes. I advised that she continue to low dose eliquis since she is on long term steroids and has hypercoagulable risk factors with pancreatic cancer.  Anderson Malta, DO Palliative Medicine   Time: 60 minutes

## 2022-08-16 ENCOUNTER — Other Ambulatory Visit: Payer: Self-pay | Admitting: Oncology

## 2022-08-22 ENCOUNTER — Other Ambulatory Visit: Payer: Self-pay | Admitting: Oncology

## 2022-08-24 MED ORDER — FENTANYL 100 MCG/HR TD PT72: 1.0000 | MEDICATED_PATCH | TRANSDERMAL | 0 refills | Status: DC

## 2022-08-26 ENCOUNTER — Inpatient Hospital Stay: Payer: BC Managed Care – PPO | Attending: Oncology | Admitting: Oncology

## 2022-08-26 ENCOUNTER — Ambulatory Visit (HOSPITAL_BASED_OUTPATIENT_CLINIC_OR_DEPARTMENT_OTHER)
Admission: RE | Admit: 2022-08-26 | Discharge: 2022-08-26 | Disposition: A | Discharge status: Home / Self Care | Payer: BC Managed Care – PPO | Source: Ambulatory Visit | Attending: Oncology | Admitting: Oncology

## 2022-08-26 VITALS — BP 101/63 | HR 84 | Temp 97.7°F | Resp 16 | Wt 103.1 lb

## 2022-08-26 DIAGNOSIS — C787 Secondary malignant neoplasm of liver and intrahepatic bile duct: Secondary | ICD-10-CM | POA: Insufficient documentation

## 2022-08-26 DIAGNOSIS — C251 Malignant neoplasm of body of pancreas: Secondary | ICD-10-CM | POA: Insufficient documentation

## 2022-08-26 DIAGNOSIS — D696 Thrombocytopenia, unspecified: Secondary | ICD-10-CM | POA: Insufficient documentation

## 2022-08-26 DIAGNOSIS — Z79899 Other long term (current) drug therapy: Secondary | ICD-10-CM | POA: Diagnosis not present

## 2022-08-26 NOTE — Progress Notes (Signed)
[facility] OFFICE PROGRESS NOTE   Diagnosis: Pancreas cancer  INTERVAL HISTORY:   Tara Jensen returns prior to scheduled visit.  She has noted increased abdominal bloating over the past several days.  She felt as though there were a "speed bump "at the lower abdomen.  She is having bowel movements.  No nausea and vomiting.  The abdominal distention is partially improved today.  She reports adequate pain control with the current narcotic regimen.  She has abdominal pain after certain foods.  She is not yet eligible for a clinical trial at [facility].  She saw Dr. Forbes Cellar 08/16/2022.  She discussed CAR-T clinical trial at [facility].  Objective:  Vital signs in last 24 hours:  Blood pressure 101/63, pulse 84, temperature 97.7 F (36.5 C), temperature source Temporal, resp. rate 16, weight 103 lb 1.6 oz (46.8 kg), last menstrual period 11/01/2018, SpO2 98%.   Resp: Lungs clear bilaterally Cardio: Regular rate and rhythm GI: No hepatosplenomegaly, no mass, nontender, diffuse mild distention of the abdomen Vascular: No leg edema   Portacath/PICC-without erythema  Lab Results:  Lab Results  Component Value Date   WBC 7.1 07/04/2022   HGB 9.8 (L) 07/04/2022   HCT 30.7 (L) 07/04/2022   MCV 99.0 07/04/2022   PLT 115 (L) 07/04/2022   NEUTROABS 6.2 07/04/2022    CMP  Lab Results  Component Value Date   NA 140 07/04/2022   K 4.0 07/04/2022   CL 106 07/04/2022   CO2 27 07/04/2022   GLUCOSE 128 (H) 07/04/2022   BUN 26 (H) 07/04/2022   CREATININE 0.43 (L) 07/04/2022   CALCIUM 9.0 07/04/2022   PROT 6.6 07/04/2022   ALBUMIN 3.8 07/04/2022   AST 11 (L) 07/04/2022   ALT 13 07/04/2022   ALKPHOS 51 07/04/2022   BILITOT 0.2 (L) 07/04/2022   GFRNONAA >60 07/04/2022   GFRAA  12/27/2021    QUESTIONABLE RESULTS, RECOMMEND RECOLLECT TO VERIFY    Lab Results  Component Value Date   CAN199 7,676 (H) 07/29/2022    Lab Results  Component Value Date   INR 1.0 04/16/2022    LABPROT 13.3 04/16/2022    Imaging:  No results found.  Medications: I have reviewed the patient's current medications.   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  Less than 180 degree abutment of the distal celiac, common hepatic, and splenic arteries, less than 180 degree abutment of the SMA CT chest 07/18/2021-no evidence of metastatic disease EUS 07/16/2021-extrinsic impression on the stomach at the posterior wall of the gastric body, no gross lesion in the duodenum, 45 x 36 mm pancreas body mass, abutment of the SMA, celiac trunk, and compression of the splenoportal confluence.  No malignant appearing lymph nodes, numerous venous collaterals adjacent to the portal vein, FNA biopsy-malignant cells consistent with adenocarcinoma, T4N0 by EUS Markedly elevated CA 19-9 Guardant360 07/31/2021-K-ras G12R, MSI high-not detected, ATM VUS Cycle 1 FOLFOX 08/08/2021 Cycle 2 FOLFIRINOX 08/22/2021 CT abdomen[facility]pelvis at [facility] 08/31/2021-hypoattenuating liver lesions consistent with metastases, mass in the pancreas neck/body with greater than 100 degree encasement of the celiac axis and common hepatic artery.  Less than 180 degree abutment of the SMA, long segment occlusion of the portal splenic confluence, splenic vein occluded, multiple collateral vessels in the upper abdomen, prominent left periaortic node Cycle 3 FOLFIRINOX 09/05/2021 Cycle 4 FOLFIRINOX  09/19/2021 Cycle 5 FOLFIRINOX 10/03/2021 CTs 10/16/2021-decrease size of primary pancreas mass, hepatic metastases, and a suspicious retroperitoneal node, resolution of left supraclavicular/low jugular adenopathy compared to a CT chest 07/18/2021.  No evidence of disease progression. Cycle 6 FOLFIRINOX 10/17/2021 Cycle 7 FOLFIRINOX 10/31/2021 Cycle  8 FOLFIRINOX 11/14/2021 Cycle 9 FOLFIRINOX 11/28/2021 Cycle 10 FOLFIRINOX 12/12/2021, oxaliplatin held secondary to neuropathy symptoms CTs 12/20/2021-decrease size of pancreas primary, no evidence of hepatic metastases, no new or progressive disease Cycle 11 FOLFIRINOX 12/27/2021, oxaliplatin held due to neuropathy symptoms Cycle 12 FOLFIRINOX 01/09/2022, oxaliplatin held 01/22/2022-C19-9 higher, 530 Cycle 13 FOLFIRINOX 01/24/2022, oxaliplatin resumed Cycle 14 FOLFIRINOX 02/06/2022 CTs 02/23/2022-increased size of the pancreas body mass new focal gastric wall thickening and loss of fat plane between the mass and the proximal gastric wall, no evidence of new or progressive metastatic disease Cycle 1 gemcitabine/Abraxane 03/06/2022 Cycle 2 gemcitabine/Abraxane 03/20/2022 CT abdomen/pelvis was 03/20/2022-slight interval increase in size of the pancreas neck/body mass with extension to the posterior gastric antrum Cycle 3 gemcitabine/Abraxane 04/04/2022-dose reduction secondary to thrombocytopenia CT abdomen/pelvis 04/14/2021-increase size of the pancreas neck/body mass with probable involvement of the gastric wall, progressive encasement of the celiac artery with chronic involvement of the portal splenic confluence MRI abdomen 05/24/2022-increase in size of the pancreas neck/body mass with vascular encasement and new thrombus in the extrahepatic portal vein, new small peripheral enhancing structure in segment 2 of the liver concerning for a metastasis, trace ascites, large stool burden Xeloda/radiation 06/10/2022, discontinued after 7 treatments secondary to severe nausea CT abdomen/pelvis 07/25/2022-increased pancreas mass-inseparable from the posterior gastric antrum, new and enlarging hepatic metastases, small volume ascites, small lower lung nodules-indeterminate Pain secondary to #1-Dilaudid drip started 04/18/2022, converted to a fentanyl drip followed by a fentanyl patch and oral Dilaudid for breakthrough  pain Celiac plexus block 03/11/2022 Celiac plexus block 04/22/2022 Weight loss secondary to #1 History of situational depression Family history of breast cancer 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    Disposition: Tara Jensen has a history of metastatic pancreas cancer.  She is currently maintained off of specific therapy for pancreas cancer.  She is currently not eligible for a [facility] clinical trial with a K-ras inhibitor.  She discussed potential eligibility for a CAR-T trial at [facility] with Dr. Forbes Cellar, but is concerned about toxicities associated with this treatment.  She presents today with increased abdominal distention.  She was noted to have a small volume of ascites on a CT last month.  She may be developing ascites.  We will refer her for an abdominal ultrasound and therapeutic paracentesis if there is a significant volume of ascites.  We discussed placement of an fusion pump device for pain control.  I recommend holding on this for now since her pain appears to be under good control with the current narcotic regimen.  Ms. Bahar plans a vacation to [place] later this month.  She will return for an office visit and Port-A-Cath flush in 4 weeks.    Thornton Papas, MD  08/26/2022  1:47 PM

## 2022-08-27 ENCOUNTER — Encounter: Payer: Self-pay | Admitting: *Deleted

## 2022-08-27 NOTE — Progress Notes (Signed)
Called and spoke with Tara Jensen regarding abdominal U/S from yesterday and that it did not show any ascites.  Dr Truett Perna stated that is likely her bowel pattern/gas and to continue Gas-x and current bowel regimen and to call with any other issues.  Tara Jensen appreciated call

## 2022-08-30 ENCOUNTER — Encounter: Payer: Self-pay | Admitting: Internal Medicine

## 2022-08-30 MED ORDER — METOCLOPRAMIDE HCL 5 MG PO TABS: 5.0000 mg | ORAL_TABLET | Freq: Four times a day (QID) | ORAL | 1 refills | Status: DC | PRN

## 2022-09-06 ENCOUNTER — Inpatient Hospital Stay: Payer: BC Managed Care – PPO | Admitting: Nurse Practitioner

## 2022-09-09 ENCOUNTER — Ambulatory Visit (HOSPITAL_BASED_OUTPATIENT_CLINIC_OR_DEPARTMENT_OTHER)
Admission: RE | Admit: 2022-09-09 | Discharge: 2022-09-09 | Disposition: A | Discharge status: Home / Self Care | Payer: BC Managed Care – PPO | Source: Ambulatory Visit | Attending: Nurse Practitioner | Admitting: Nurse Practitioner

## 2022-09-09 ENCOUNTER — Other Ambulatory Visit: Payer: Self-pay | Admitting: Nurse Practitioner

## 2022-09-09 ENCOUNTER — Inpatient Hospital Stay: Payer: BC Managed Care – PPO | Admitting: Oncology

## 2022-09-09 ENCOUNTER — Encounter: Payer: Self-pay | Admitting: Internal Medicine

## 2022-09-09 ENCOUNTER — Other Ambulatory Visit: Payer: Self-pay | Admitting: *Deleted

## 2022-09-09 ENCOUNTER — Ambulatory Visit (HOSPITAL_COMMUNITY)
Admission: RE | Admit: 2022-09-09 | Discharge: 2022-09-09 | Disposition: A | Discharge status: Home / Self Care | Payer: BC Managed Care – PPO | Source: Ambulatory Visit | Attending: Nurse Practitioner | Admitting: Nurse Practitioner

## 2022-09-09 ENCOUNTER — Inpatient Hospital Stay: Payer: BC Managed Care – PPO

## 2022-09-09 ENCOUNTER — Encounter: Payer: Self-pay | Admitting: Nurse Practitioner

## 2022-09-09 ENCOUNTER — Inpatient Hospital Stay (HOSPITAL_BASED_OUTPATIENT_CLINIC_OR_DEPARTMENT_OTHER): Payer: BC Managed Care – PPO | Admitting: Nurse Practitioner

## 2022-09-09 VITALS — BP 98/64 | HR 82 | Temp 97.9°F | Resp 18 | Ht 70.0 in | Wt 105.7 lb

## 2022-09-09 DIAGNOSIS — C251 Malignant neoplasm of body of pancreas: Secondary | ICD-10-CM

## 2022-09-09 MED ORDER — LIDOCAINE HCL 1 % IJ SOLN
INTRAMUSCULAR | Status: AC
  Filled 2022-09-09: qty 20

## 2022-09-09 MED ORDER — RIFAXIMIN 200 MG PO TABS: 200.0000 mg | ORAL_TABLET | Freq: Three times a day (TID) | ORAL | 0 refills | Status: AC

## 2022-09-09 NOTE — Progress Notes (Signed)
Interventional Radiology Brief Note:  Limited US Abdomen shows a small pocket of pelvic fluid which is not amenable to drainage at this time. No procedure performed.   Loyce Dys, MS RD PA-C

## 2022-09-09 NOTE — Progress Notes (Signed)
Tara Jensen returns prior to scheduled follow-up.  She notes worsening abdominal distention.  No nausea or vomiting.  Bowels are moving.  Pain is well-controlled.  Objective:  Vital signs in last 24 hours:  Blood pressure 98/64, pulse 82, temperature 97.9 F (36.6 C), temperature source Oral, resp. rate 18, height 5\' 10"  (1.778 m), weight 105 lb 11.2 oz (47.9 kg), last menstrual period 11/01/2018, SpO2 100%.    HEENT: No thrush or ulcers. Resp: Lungs clear bilaterally. Cardio: Regular rate and rhythm. GI: Abdomen is distended, question ascites. Vascular: No leg edema. Skin: Skin turgor is intact.   Lab Results:  Lab Results  Component Value Date   WBC 7.1 07/04/2022   HGB 9.8 (L) 07/04/2022   HCT 30.7 (L) 07/04/2022   MCV 99.0 07/04/2022   PLT 115 (L) 07/04/2022   NEUTROABS 6.2 07/04/2022    Imaging:  No results found.  Medications: I have reviewed the patient's current medications.  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  Less than 180 degree abutment of the distal celiac, common hepatic, and splenic arteries, less than 180 degree abutment of the SMA CT chest 07/18/2021-no evidence of metastatic disease EUS 07/16/2021-extrinsic impression on the stomach at the posterior wall of the gastric body, no gross lesion in the duodenum, 45 x 36 mm pancreas body mass, abutment of the SMA, celiac trunk, and compression of the splenoportal confluence.  No malignant appearing lymph nodes, numerous venous collaterals adjacent to the portal vein, FNA biopsy-malignant cells consistent with adenocarcinoma, T4N0 by EUS Markedly elevated CA 19-9 [employer]  07/31/2021-K-ras G12R, MSI high-not detected, ATM VUS Cycle 1 FOLFOX 08/08/2021 Cycle 2 FOLFIRINOX 08/22/2021 CT abdomen/pelvis at [facility] 08/31/2021-hypoattenuating liver lesions consistent with metastases, mass in the pancreas neck/body with greater than 100 degree encasement of the celiac axis and common hepatic artery.  Less than 180 degree abutment of the SMA, long segment occlusion of the portal splenic confluence, splenic vein occluded, multiple collateral vessels in the upper abdomen, prominent left periaortic node Cycle 3 FOLFIRINOX 09/05/2021 Cycle 4 FOLFIRINOX 09/19/2021 Cycle 5 FOLFIRINOX 10/03/2021 CTs 10/16/2021-decrease size of primary pancreas mass, hepatic metastases, and a suspicious retroperitoneal node, resolution of left supraclavicular/low jugular adenopathy compared to a CT chest 07/18/2021.  No evidence of disease progression. Cycle 6 FOLFIRINOX 10/17/2021 Cycle 7 FOLFIRINOX 10/31/2021 Cycle 8 FOLFIRINOX 11/14/2021 Cycle 9 FOLFIRINOX 11/28/2021 Cycle 10 FOLFIRINOX 12/12/2021, oxaliplatin held secondary to neuropathy symptoms CTs 12/20/2021-decrease size of pancreas primary, no evidence of hepatic metastases, no new or progressive disease Cycle 11 FOLFIRINOX 12/27/2021, oxaliplatin held due to neuropathy symptoms Cycle 12 FOLFIRINOX 01/09/2022, oxaliplatin held 01/22/2022-C19-9 higher, 530 Cycle 13 FOLFIRINOX 01/24/2022, oxaliplatin resumed Cycle 14 FOLFIRINOX 02/06/2022 CTs 02/23/2022-increased size of the pancreas body mass new focal gastric wall thickening and loss of fat plane between the mass and the proximal gastric wall, no evidence of new or progressive metastatic disease Cycle 1 gemcitabine/Abraxane 03/06/2022 Cycle 2 gemcitabine/Abraxane 03/20/2022 CT abdomen/pelvis was 03/20/2022-slight interval increase in size of the pancreas neck/body mass with extension to the posterior gastric antrum Cycle 3 gemcitabine/Abraxane 04/04/2022-dose reduction secondary to thrombocytopenia CT  abdomen/pelvis 04/14/2021-increase size of the pancreas neck/body mass with probable involvement of the gastric wall, progressive encasement of the celiac artery with chronic involvement  of the portal splenic confluence MRI abdomen 05/24/2022-increase in size of the pancreas neck/body mass with vascular encasement and new thrombus in the extrahepatic portal vein, new small peripheral enhancing structure in segment 2 of the liver concerning for a metastasis, trace ascites, large stool burden Xeloda/radiation 06/10/2022, discontinued after 7 treatments secondary to severe nausea CT abdomen/pelvis 07/25/2022-increased pancreas mass-inseparable from the posterior gastric antrum, new and enlarging hepatic metastases, small volume ascites, small lower lung nodules-indeterminate Pain secondary to #1-Dilaudid drip started 04/18/2022, converted to a fentanyl drip followed by a fentanyl patch and oral Dilaudid for breakthrough pain Celiac plexus block 03/11/2022 Celiac plexus block 04/22/2022 Weight loss secondary to #1 History of situational depression Family history of breast cancer 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 appears stable.  She has worsened abdominal distention.  She may be developing ascites.  She does not have symptoms to suggest a bowel obstruction.  We are referring her for an abdominal ultrasound with the plan for a paracentesis if significant ascites is identified.  We discussed proceeding with a CT scan if there is not significant ascites.  She is scheduled to return for follow-up .  We are available to see her sooner if needed.    Lonna Cobb ANP/GNP-BC   09/09/2022  12:16 PM

## 2022-09-10 ENCOUNTER — Other Ambulatory Visit: Payer: Self-pay | Admitting: Nurse Practitioner

## 2022-09-10 DIAGNOSIS — C251 Malignant neoplasm of body of pancreas: Secondary | ICD-10-CM

## 2022-09-12 ENCOUNTER — Inpatient Hospital Stay: Payer: BC Managed Care – PPO | Attending: Oncology

## 2022-09-12 ENCOUNTER — Encounter (HOSPITAL_COMMUNITY): Payer: Self-pay

## 2022-09-12 ENCOUNTER — Encounter (HOSPITAL_COMMUNITY): Payer: Self-pay | Admitting: Family Medicine

## 2022-09-12 ENCOUNTER — Inpatient Hospital Stay: Payer: BC Managed Care – PPO

## 2022-09-12 ENCOUNTER — Ambulatory Visit (HOSPITAL_BASED_OUTPATIENT_CLINIC_OR_DEPARTMENT_OTHER): Payer: BC Managed Care – PPO

## 2022-09-12 ENCOUNTER — Encounter: Payer: Self-pay | Admitting: Nurse Practitioner

## 2022-09-12 ENCOUNTER — Inpatient Hospital Stay (HOSPITAL_BASED_OUTPATIENT_CLINIC_OR_DEPARTMENT_OTHER): Payer: BC Managed Care – PPO | Admitting: Nurse Practitioner

## 2022-09-12 ENCOUNTER — Inpatient Hospital Stay (HOSPITAL_COMMUNITY)
Admission: AD | Admit: 2022-09-12 | Discharge: 2022-10-13 | DRG: 947 | Disposition: E | Payer: BC Managed Care – PPO | Source: Ambulatory Visit | Attending: Internal Medicine | Admitting: Internal Medicine

## 2022-09-12 ENCOUNTER — Other Ambulatory Visit: Payer: Self-pay

## 2022-09-12 VITALS — BP 104/52 | HR 72 | Temp 98.2°F | Resp 18

## 2022-09-12 DIAGNOSIS — R54 Age-related physical debility: Secondary | ICD-10-CM | POA: Diagnosis present

## 2022-09-12 DIAGNOSIS — Z9221 Personal history of antineoplastic chemotherapy: Secondary | ICD-10-CM | POA: Diagnosis not present

## 2022-09-12 DIAGNOSIS — R634 Abnormal weight loss: Secondary | ICD-10-CM

## 2022-09-12 DIAGNOSIS — R531 Weakness: Secondary | ICD-10-CM

## 2022-09-12 DIAGNOSIS — C251 Malignant neoplasm of body of pancreas: Secondary | ICD-10-CM

## 2022-09-12 DIAGNOSIS — G893 Neoplasm related pain (acute) (chronic): Secondary | ICD-10-CM

## 2022-09-12 DIAGNOSIS — Z66 Do not resuscitate: Secondary | ICD-10-CM | POA: Diagnosis present

## 2022-09-12 DIAGNOSIS — R188 Other ascites: Secondary | ICD-10-CM

## 2022-09-12 DIAGNOSIS — R066 Hiccough: Secondary | ICD-10-CM | POA: Diagnosis not present

## 2022-09-12 DIAGNOSIS — R180 Malignant ascites: Secondary | ICD-10-CM | POA: Diagnosis present

## 2022-09-12 DIAGNOSIS — C787 Secondary malignant neoplasm of liver and intrahepatic bile duct: Secondary | ICD-10-CM | POA: Diagnosis present

## 2022-09-12 DIAGNOSIS — K311 Adult hypertrophic pyloric stenosis: Secondary | ICD-10-CM | POA: Diagnosis present

## 2022-09-12 DIAGNOSIS — Z681 Body mass index (BMI) 19 or less, adult: Secondary | ICD-10-CM | POA: Diagnosis not present

## 2022-09-12 DIAGNOSIS — C7951 Secondary malignant neoplasm of bone: Secondary | ICD-10-CM | POA: Diagnosis present

## 2022-09-12 DIAGNOSIS — R109 Unspecified abdominal pain: Secondary | ICD-10-CM | POA: Diagnosis present

## 2022-09-12 DIAGNOSIS — T451X5A Adverse effect of antineoplastic and immunosuppressive drugs, initial encounter: Secondary | ICD-10-CM | POA: Diagnosis present

## 2022-09-12 DIAGNOSIS — F32A Depression, unspecified: Secondary | ICD-10-CM | POA: Diagnosis present

## 2022-09-12 DIAGNOSIS — K219 Gastro-esophageal reflux disease without esophagitis: Secondary | ICD-10-CM | POA: Diagnosis present

## 2022-09-12 DIAGNOSIS — Z7189 Other specified counseling: Secondary | ICD-10-CM

## 2022-09-12 DIAGNOSIS — F419 Anxiety disorder, unspecified: Secondary | ICD-10-CM | POA: Diagnosis present

## 2022-09-12 DIAGNOSIS — Z923 Personal history of irradiation: Secondary | ICD-10-CM | POA: Diagnosis not present

## 2022-09-12 DIAGNOSIS — C259 Malignant neoplasm of pancreas, unspecified: Secondary | ICD-10-CM | POA: Diagnosis not present

## 2022-09-12 DIAGNOSIS — Z7901 Long term (current) use of anticoagulants: Secondary | ICD-10-CM | POA: Diagnosis not present

## 2022-09-12 DIAGNOSIS — Z803 Family history of malignant neoplasm of breast: Secondary | ICD-10-CM

## 2022-09-12 DIAGNOSIS — Z7952 Long term (current) use of systemic steroids: Secondary | ICD-10-CM

## 2022-09-12 DIAGNOSIS — Z818 Family history of other mental and behavioral disorders: Secondary | ICD-10-CM

## 2022-09-12 DIAGNOSIS — R1084 Generalized abdominal pain: Secondary | ICD-10-CM | POA: Diagnosis not present

## 2022-09-12 DIAGNOSIS — R110 Nausea: Secondary | ICD-10-CM | POA: Diagnosis not present

## 2022-09-12 DIAGNOSIS — E43 Unspecified severe protein-calorie malnutrition: Secondary | ICD-10-CM | POA: Diagnosis present

## 2022-09-12 DIAGNOSIS — Z8249 Family history of ischemic heart disease and other diseases of the circulatory system: Secondary | ICD-10-CM

## 2022-09-12 DIAGNOSIS — Z79899 Other long term (current) drug therapy: Secondary | ICD-10-CM

## 2022-09-12 DIAGNOSIS — K5900 Constipation, unspecified: Secondary | ICD-10-CM | POA: Diagnosis not present

## 2022-09-12 DIAGNOSIS — Z87440 Personal history of urinary (tract) infections: Secondary | ICD-10-CM

## 2022-09-12 DIAGNOSIS — R64 Cachexia: Secondary | ICD-10-CM | POA: Diagnosis present

## 2022-09-12 DIAGNOSIS — G620 Drug-induced polyneuropathy: Secondary | ICD-10-CM | POA: Diagnosis present

## 2022-09-12 DIAGNOSIS — R630 Anorexia: Secondary | ICD-10-CM

## 2022-09-12 DIAGNOSIS — Z515 Encounter for palliative care: Secondary | ICD-10-CM

## 2022-09-12 DIAGNOSIS — D630 Anemia in neoplastic disease: Secondary | ICD-10-CM | POA: Diagnosis present

## 2022-09-12 DIAGNOSIS — R530 Neoplastic (malignant) related fatigue: Secondary | ICD-10-CM

## 2022-09-12 DIAGNOSIS — Z791 Long term (current) use of non-steroidal anti-inflammatories (NSAID): Secondary | ICD-10-CM

## 2022-09-12 DIAGNOSIS — Z823 Family history of stroke: Secondary | ICD-10-CM

## 2022-09-12 LAB — CBC WITH DIFFERENTIAL (CANCER CENTER ONLY)
Abs Immature Granulocytes: 0.01 10*3/uL (ref 0.00–0.07)
Basophils Absolute: 0 10*3/uL (ref 0.0–0.1)
Basophils Relative: 0 %
Eosinophils Absolute: 0 10*3/uL (ref 0.0–0.5)
Eosinophils Relative: 1 %
HCT: 25.5 % — ABNORMAL LOW (ref 36.0–46.0)
Hemoglobin: 8.3 g/dL — ABNORMAL LOW (ref 12.0–15.0)
Immature Granulocytes: 0 %
Lymphocytes Relative: 14 %
Lymphs Abs: 0.6 10*3/uL — ABNORMAL LOW (ref 0.7–4.0)
MCH: 30.6 pg (ref 26.0–34.0)
MCHC: 32.5 g/dL (ref 30.0–36.0)
MCV: 94.1 fL (ref 80.0–100.0)
Monocytes Absolute: 0.4 10*3/uL (ref 0.1–1.0)
Monocytes Relative: 9 %
Neutro Abs: 3.1 10*3/uL (ref 1.7–7.7)
Neutrophils Relative %: 76 %
Platelet Count: 107 10*3/uL — ABNORMAL LOW (ref 150–400)
RBC: 2.71 MIL/uL — ABNORMAL LOW (ref 3.87–5.11)
RDW: 13.8 % (ref 11.5–15.5)
WBC Count: 4 10*3/uL (ref 4.0–10.5)
nRBC: 0 % (ref 0.0–0.2)

## 2022-09-12 LAB — CMP (CANCER CENTER ONLY)
ALT: 21 U/L (ref 0–44)
AST: 21 U/L (ref 15–41)
Albumin: 3.2 g/dL — ABNORMAL LOW (ref 3.5–5.0)
Alkaline Phosphatase: 107 U/L (ref 38–126)
Anion gap: 5 (ref 5–15)
BUN: 20 mg/dL (ref 6–20)
CO2: 28 mmol/L (ref 22–32)
Calcium: 9.3 mg/dL (ref 8.9–10.3)
Chloride: 107 mmol/L (ref 98–111)
Creatinine: 0.41 mg/dL — ABNORMAL LOW (ref 0.44–1.00)
GFR, Estimated: >60 mL/min (ref >60)
Glucose, Bld: 111 mg/dL — ABNORMAL HIGH (ref 70–99)
Potassium: 3.9 mmol/L (ref 3.5–5.1)
Sodium: 140 mmol/L (ref 135–145)
Total Bilirubin: 0.3 mg/dL (ref 0.3–1.2)
Total Protein: 6.1 g/dL — ABNORMAL LOW (ref 6.5–8.1)

## 2022-09-12 MED ORDER — RIFAXIMIN 200 MG PO TABS
200.0000 mg | ORAL_TABLET | Freq: Three times a day (TID) | ORAL | Status: DC
  Filled 2022-09-12: qty 1

## 2022-09-12 MED ORDER — ENSURE MAX PROTEIN PO LIQD
11.0000 [oz_av] | Freq: Two times a day (BID) | ORAL | Status: DC
  Administered 2022-09-13: 11 [oz_av] via ORAL

## 2022-09-12 MED ORDER — METHYLNALTREXONE BROMIDE 12 MG/0.6ML ~~LOC~~ SOLN
12.0000 mg | SUBCUTANEOUS | Status: DC
  Administered 2022-09-12 – 2022-09-14 (×3): 12 mg via SUBCUTANEOUS
  Filled 2022-09-12 (×4): qty 0.6

## 2022-09-12 MED ORDER — CHLORHEXIDINE GLUCONATE CLOTH 2 % EX PADS
6.0000 | MEDICATED_PAD | Freq: Every day | CUTANEOUS | Status: DC
  Administered 2022-09-13 – 2022-09-21 (×9): 6 via TOPICAL

## 2022-09-12 MED ORDER — HYDROMORPHONE HCL 1 MG/ML IJ SOLN
0.2500 mg | Freq: Once | INTRAMUSCULAR | Status: DC
  Filled 2022-09-12: qty 1

## 2022-09-12 MED ORDER — METOCLOPRAMIDE HCL 5 MG/ML IJ SOLN
10.0000 mg | Freq: Once | INTRAMUSCULAR | Status: AC
  Administered 2022-09-12: 10 mg via INTRAVENOUS
  Filled 2022-09-12: qty 2

## 2022-09-12 MED ORDER — METOCLOPRAMIDE HCL 5 MG/ML IJ SOLN
5.0000 mg | Freq: Four times a day (QID) | INTRAMUSCULAR | Status: DC
  Administered 2022-09-12 – 2022-09-17 (×21): 5 mg via INTRAVENOUS
  Filled 2022-09-12 (×21): qty 2

## 2022-09-12 MED ORDER — HYDROMORPHONE HCL 1 MG/ML IJ SOLN
1.0000 mg | INTRAMUSCULAR | Status: DC | PRN
  Administered 2022-09-12: 1 mg via INTRAVENOUS
  Administered 2022-09-13 – 2022-09-14 (×6): 2 mg via INTRAVENOUS
  Filled 2022-09-12 (×4): qty 2
  Filled 2022-09-12: qty 1
  Filled 2022-09-12 (×2): qty 2

## 2022-09-12 MED ORDER — MODAFINIL 200 MG PO TABS
200.0000 mg | ORAL_TABLET | Freq: Every day | ORAL | Status: DC
  Administered 2022-09-13 – 2022-09-16 (×4): 200 mg via ORAL
  Filled 2022-09-12 (×5): qty 1

## 2022-09-12 MED ORDER — BISACODYL 5 MG PO TBEC: 5.0000 mg | DELAYED_RELEASE_TABLET | Freq: Every day | ORAL | Status: DC | PRN

## 2022-09-12 MED ORDER — HYDRALAZINE HCL 20 MG/ML IJ SOLN: 5.0000 mg | Freq: Four times a day (QID) | INTRAMUSCULAR | Status: DC | PRN

## 2022-09-12 MED ORDER — PANTOPRAZOLE SODIUM 40 MG IV SOLR
40.0000 mg | Freq: Two times a day (BID) | INTRAVENOUS | Status: DC
  Administered 2022-09-12 – 2022-09-18 (×12): 40 mg via INTRAVENOUS
  Filled 2022-09-12 (×12): qty 10

## 2022-09-12 MED ORDER — DEXAMETHASONE 4 MG PO TABS
4.0000 mg | ORAL_TABLET | Freq: Every day | ORAL | Status: DC
  Administered 2022-09-12 – 2022-09-14 (×3): 4 mg via ORAL
  Filled 2022-09-12 (×3): qty 1

## 2022-09-12 MED ORDER — SODIUM CHLORIDE 0.9% FLUSH
3.0000 mL | Freq: Two times a day (BID) | INTRAVENOUS | Status: DC
  Administered 2022-09-12 – 2022-09-23 (×11): 3 mL via INTRAVENOUS

## 2022-09-12 MED ORDER — ONDANSETRON HCL 4 MG PO TABS: 4.0000 mg | ORAL_TABLET | Freq: Four times a day (QID) | ORAL | Status: DC | PRN

## 2022-09-12 MED ORDER — SODIUM CHLORIDE 0.9 % IV SOLN: INTRAVENOUS | Status: DC

## 2022-09-12 MED ORDER — TRAZODONE HCL 50 MG PO TABS
50.0000 mg | ORAL_TABLET | Freq: Every day | ORAL | Status: DC
  Administered 2022-09-12 – 2022-09-21 (×7): 50 mg via ORAL
  Filled 2022-09-12 (×9): qty 1

## 2022-09-12 MED ORDER — LACTATED RINGERS IV SOLN
Freq: Once | INTRAVENOUS | Status: AC
  Filled 2022-09-12: qty 250

## 2022-09-12 MED ORDER — TRAZODONE HCL 50 MG PO TABS: 25.0000 mg | ORAL_TABLET | Freq: Every evening | ORAL | Status: DC | PRN

## 2022-09-12 MED ORDER — RIFAXIMIN 550 MG PO TABS
550.0000 mg | ORAL_TABLET | Freq: Three times a day (TID) | ORAL | Status: DC
  Administered 2022-09-12 – 2022-09-13 (×2): 550 mg via ORAL
  Filled 2022-09-12 (×4): qty 1

## 2022-09-12 MED ORDER — SENNOSIDES-DOCUSATE SODIUM 8.6-50 MG PO TABS: 1.0000 | ORAL_TABLET | Freq: Every evening | ORAL | Status: DC | PRN

## 2022-09-12 MED ORDER — APIXABAN 2.5 MG PO TABS
2.5000 mg | ORAL_TABLET | Freq: Two times a day (BID) | ORAL | Status: DC
  Administered 2022-09-12 – 2022-09-13 (×2): 2.5 mg via ORAL
  Filled 2022-09-12 (×2): qty 1

## 2022-09-12 MED ORDER — SODIUM CHLORIDE 0.9% FLUSH: 10.0000 mL | INTRAVENOUS | Status: DC | PRN

## 2022-09-12 MED ORDER — DIAZEPAM 2 MG PO TABS
1.0000 mg | ORAL_TABLET | Freq: Two times a day (BID) | ORAL | Status: DC | PRN
  Administered 2022-09-13: 1 mg via ORAL
  Filled 2022-09-12: qty 1

## 2022-09-12 MED ORDER — ALUM & MAG HYDROXIDE-SIMETH 200-200-20 MG/5ML PO SUSP
30.0000 mL | Freq: Once | ORAL | Status: AC
  Administered 2022-09-12: 30 mL via ORAL
  Filled 2022-09-12: qty 30

## 2022-09-12 MED ORDER — ONDANSETRON HCL 4 MG/2ML IJ SOLN
4.0000 mg | Freq: Four times a day (QID) | INTRAMUSCULAR | Status: DC | PRN
  Administered 2022-09-14 – 2022-09-23 (×9): 4 mg via INTRAVENOUS
  Filled 2022-09-12 (×9): qty 2

## 2022-09-12 MED ORDER — FENTANYL 100 MCG/HR TD PT72
1.0000 | MEDICATED_PATCH | TRANSDERMAL | Status: DC
  Administered 2022-09-12 – 2022-09-14 (×2): 1 via TRANSDERMAL
  Filled 2022-09-12 (×2): qty 1

## 2022-09-12 MED ORDER — DULOXETINE HCL 30 MG PO CPEP
30.0000 mg | ORAL_CAPSULE | Freq: Every day | ORAL | Status: DC
  Administered 2022-09-12 – 2022-09-16 (×5): 30 mg via ORAL
  Filled 2022-09-12 (×5): qty 1

## 2022-09-12 MED ORDER — DIAZEPAM 2 MG PO TABS: 1.0000 mg | ORAL_TABLET | Freq: Two times a day (BID) | ORAL | Status: DC | PRN

## 2022-09-12 MED ORDER — DIPHENOXYLATE-ATROPINE 2.5-0.025 MG PO TABS: 1.0000 | ORAL_TABLET | Freq: Four times a day (QID) | ORAL | Status: DC | PRN

## 2022-09-12 NOTE — Progress Notes (Signed)
Patient to be admitted for further work up. Report was given to Kennedy, RN on 5w. Patient sent upstairs for admission with port accessed, biopatch in place and home-dressing in place, clean, dry, and in-tact. Port was left saline locked for further use. Patient was taken to room and oriented to bed, bathroom, call light (within reach), and telephone. Patient was placed in bed with bed alarm set. NT Gabby in room to change pt into gown, RN Colin Mulders made aware. No further needs at this time.

## 2022-09-12 NOTE — Progress Notes (Signed)
1438- Pt declined to receive IV fluids at this time. Pt educated on low BP and need for IVF, pt declined stating that it was making her abdomen feel more distended. Fluid rate changed to KVO (see MAR for times), call light within reach, and Dr.Golding made aware. Care continues as pt waits for bed placement.   1525- RN continues to round on pt, pt asleep in room with family member, chest rise is equal and regular, will hold pain medication at this time per MD. Family made aware to use call light with any questions or concerns.

## 2022-09-12 NOTE — H&P (Addendum)
History and Physical   [facility] [place] @ [facility] Admission History and Physical [employer], D.O.    Patient Name: Tara Jensen MR#: [id number] Date of Birth: Oct 21, 1965 Date of Admission: 09/12/2022  Referring MD/NP/PA: [facility] Primary Care Physician: Eartha Inch, MD  Chief Complaint: No chief complaint on file.   HPI: Tara Jensen is a 57 y.o. female with a known history of pancreatic cancer with mets to liver, off chemotherapy presents to the emergency department for evaluation of worsening abdominal pain and distention as well as nausea, reflux and decreased appetite.  Patient was sent as a direct admission from the cancer center for IV fluid hydration and pain control and CT imaging to evaluate for gastric outlet obstruction.  Palliative care physician Dr. Anderson Malta has consulted and has also requested CT scan to evaluate for gastric outlet obstruction given worsening abdominal pain.  She is passing gas, last BM 3 days ago.  Of note patient has been utilizing medications at home without relief.  She has been off chemotherapy after failing to respond to treatment.  Her pain management treatment has included opioids, celiac plexus block and oral medications  Review of Systems:  CONSTITUTIONAL: Fatigue.  No fever/chills, weakness, weight gain/loss, headache. EYES: No blurry or double vision. ENT: No tinnitus, postnasal drip, redness or soreness of the oropharynx. RESPIRATORY: No cough, dyspnea, wheeze.  No hemoptysis.  CARDIOVASCULAR: No chest pain, palpitations, syncope, orthopnea. No lower extremity edema.  GASTROINTESTINAL: Positive abdominal pain, distention, nausea, decreased p.o. intake GENITOURINARY: No dysuria, frequency, hematuria. ENDOCRINE: No polyuria or nocturia. No heat or cold intolerance. HEMATOLOGY: No anemia, bruising, bleeding. INTEGUMENTARY: No rashes, ulcers, lesions. MUSCULOSKELETAL: No arthritis, gout. NEUROLOGIC: No  numbness, tingling, ataxia, seizure-type activity, weakness. PSYCHIATRIC: No anxiety, depression, insomnia.   Past Medical History:  Diagnosis Date   Abnormal Pap smear    Anxiety    "situational"   BV (bacterial vaginosis)    Cancer (HCC)    pancreatic   Cervical polyp    Depression    "situational"   Family history of adverse reaction to anesthesia    mother had N/V   Recurrent UTI (urinary tract infection)     Past Surgical History:  Procedure Laterality Date   BIOPSY  07/16/2021   Procedure: BIOPSY;  Surgeon: Meridee Score, Netty Starring., MD;  Location: [facility] ENDOSCOPY;  Service: Gastroenterology;;   CERVICAL CONE BIOPSY  1996   ESOPHAGOGASTRODUODENOSCOPY (EGD) WITH PROPOFOL N/A 07/16/2021   Procedure: ESOPHAGOGASTRODUODENOSCOPY (EGD) WITH PROPOFOL;  Surgeon: [name]., MD;  Location: [facility] ENDOSCOPY;  Service: Gastroenterology;  Laterality: N/A;   FINE NEEDLE ASPIRATION  07/16/2021   Procedure: FINE NEEDLE ASPIRATION (FNA) LINEAR;  Surgeon: [name]., MD;  Location: [facility] ENDOSCOPY;  Service: Gastroenterology;;   IR RADIOLOGIST EVAL & MGMT  02/26/2022   LASIK     PORTACATH PLACEMENT Right 08/01/2021   Procedure: INSERTION PORT-A-CATH;  Surgeon: [name], MD;  Location: [facility] OR;  Service: General;  Laterality: Right;   UPPER ESOPHAGEAL ENDOSCOPIC ULTRASOUND (EUS) N/A 07/16/2021   Procedure: UPPER ESOPHAGEAL ENDOSCOPIC ULTRASOUND (EUS);  Surgeon: [name]., MD;  Location: [facility] ENDOSCOPY;  Service: Gastroenterology;  Laterality: N/A;     reports that she has never smoked. She has never used smokeless tobacco. She reports that she does not currently use alcohol. She reports that she does not use drugs.  No Known Allergies  Family History  Problem Relation Age of Onset   Depression Mother  Depression Brother    Stroke Maternal Grandmother    Hypertension Maternal Grandfather    Breast cancer Cousin 29       maternal first cousin   Colon cancer  Neg Hx    Esophageal cancer Neg Hx    Rectal cancer Neg Hx    Stomach cancer Neg Hx     Prior to Admission medications   Medication Sig Start Date End Date Taking? Authorizing Provider  diphenoxylate-atropine (LOMOTIL) 2.5-0.025 MG tablet Take 1-2 tablets by mouth 4 (four) times daily as needed for diarrhea or loose stools. Patient not taking: Reported on 07/04/2022 06/18/22   Edsel Petrin, DO  bisacodyl (DULCOLAX) 10 MG suppository Place 1 suppository (10 mg total) rectally as needed for moderate constipation. 06/01/22   Edsel Petrin, DO  dexamethasone (DECADRON) 4 MG tablet Take 2 tablets (8 mg total) by mouth daily. Take one tablet every AM or as directed by provider 06/01/22   Edsel Petrin, DO  diazepam (VALIUM) 2 MG tablet Take 0.5 tablets (1 mg total) by mouth every 12 (twelve) hours as needed for anxiety or muscle spasms. Patient not taking: Reported on 05/14/2022 05/01/22   Edsel Petrin, DO  docusate sodium (COLACE) 250 MG capsule Take 1 capsule (250 mg total) by mouth 2 (two) times daily. 06/01/22   Edsel Petrin, DO  DULoxetine (CYMBALTA) 30 MG capsule TAKE ONE CAPSULE BY MOUTH DAILY 08/22/22   [name], MD  ELIQUIS 2.5 MG TABS tablet TAKE ONE TABLET BY MOUTH TWICE DAILY 08/22/22   [name], MD  famotidine (PEPCID) 40 MG tablet Take 1 tablet (40 mg total) by mouth 2 (two) times daily. 06/01/22   Edsel Petrin, DO  fentaNYL (DURAGESIC) 100 MCG/HR Place 1 patch onto the skin every other day. 08/24/22   Edsel Petrin, DO  gabapentin (NEURONTIN) 300 MG capsule TAKE ONE CAPSULE BY MOUTH THREE TIMES DAILY 07/29/22   [name], MD  [facility] MAGNESIUM OXIDE PO Take 400 mg by mouth daily. Gummie OTC--takes 800 mg/day    [provider]  HYDROmorphone (DILAUDID) 4 MG tablet Take 1 tablet (4 mg total) by mouth every 3 (three) hours as needed for severe pain. 08/15/22 09/14/22  Edsel Petrin, DO  lidocaine (LIDODERM) 5 % Place 1  patch onto the skin daily. Remove & Discard patch within 12 hours or as directed by MD 07/12/22   Edsel Petrin, DO  lidocaine-prilocaine (EMLA) cream Apply 1 Application topically as needed. 07/29/22   [name], MD  meloxicam (MOBIC) 15 MG tablet Take 1 tablet (15 mg total) by mouth daily. 06/01/22   Edsel Petrin, DO  metoCLOPramide (REGLAN) 5 MG tablet Take 1 tablet (5 mg total) by mouth every 6 (six) hours as needed for nausea. 08/30/22   Edsel Petrin, DO  modafinil (PROVIGIL) 200 MG tablet TAKE ONE TABLET BY MOUTH DAILY WITH BREAKFAST 07/09/22   Pickenpack-Cousar, Arty Baumgartner, NP  Naldemedine Tosylate (SYMPROIC) 0.2 MG TABS Take as directed. 04/25/22   Edsel Petrin, DO  OVER THE COUNTER MEDICATION Take 2 tablets by mouth daily at 6 (six) AM. Superbeet gummies    [provider]  Pancrelipase, Lip-Prot-Amyl, 3000-9500 units CPEP Take 2 capsules (6,000 Units total) by mouth 2 (two) times daily before lunch and supper. Patient not taking: Reported on 07/29/2022 07/15/22   Edsel Petrin, DO  pantoprazole (PROTONIX) 40 MG tablet Take 40 mg by mouth daily before breakfast.  07/11/21   [provider]  rifaximin (XIFAXAN) 200 MG tablet Take 1 tablet (200 mg total) by mouth 3 (three) times daily for 10 days. 09/09/22 09/19/22  Edsel Petrin, DO  traZODone (DESYREL) 50 MG tablet TAKE ONE TABLET BY MOUTH AT BEDTIME 08/16/22   [name], MD    Physical Exam: Vitals:   09/12/22 1749  BP: (!) 92/52  Temp: 98.4 F (36.9 C)    GENERAL: 57 y.o.-year-old white female patient, well-developed, thin, frail.  Pleasant and cooperative.   HEENT: Head atraumatic, normocephalic. Pupils equal. Mucus membranes moist. NECK: Supple. No JVD. CHEST: Normal breath sounds bilaterally. No wheezing, rales, rhonchi or crackles. No use of accessory muscles of respiration.  No reproducible chest wall tenderness.  CARDIOVASCULAR: S1, S2 normal. No murmurs, rubs, or  gallops. Cap refill <2 seconds. Pulses intact distally.  ABDOMEN: Distended, diffusely tender, hyperactive bowel sounds EXTREMITIES: No pedal edema, cyanosis, or clubbing. No calf tenderness or Homan's sign.  NEUROLOGIC: The patient is alert and oriented x 3. Cranial nerves II through XII are grossly intact with no focal sensorimotor deficit. PSYCHIATRIC:  Normal affect, mood, thought content. SKIN: Warm, dry, and intact without obvious rash, lesion, or ulcer.    Labs on Admission:  CBC: Recent Labs  Lab 09/12/22 1349  WBC 4.0  NEUTROABS 3.1  HGB 8.3*  HCT 25.5*  MCV 94.1  PLT 107*   Basic Metabolic Panel: Recent Labs  Lab 09/12/22 1349  NA 140  K 3.9  CL 107  CO2 28  GLUCOSE 111*  BUN 20  CREATININE 0.41*  CALCIUM 9.3   GFR: Estimated Creatinine Clearance: 60.8 mL/min (A) (by C-G formula based on SCr of 0.41 mg/dL (L)). Liver Function Tests: Recent Labs  Lab 09/12/22 1349  AST 21  ALT 21  ALKPHOS 107  BILITOT 0.3  PROT 6.1*  ALBUMIN 3.2*   No results for input(s): "LIPASE", "AMYLASE" in the last 168 hours. No results for input(s): "AMMONIA" in the last 168 hours. Coagulation Profile: No results for input(s): "INR", "PROTIME" in the last 168 hours. Cardiac Enzymes: No results for input(s): "CKTOTAL", "CKMB", "CKMBINDEX", "TROPONINI" in the last 168 hours. BNP (last 3 results) No results for input(s): "PROBNP" in the last 8760 hours. HbA1C: No results for input(s): "HGBA1C" in the last 72 hours. CBG: No results for input(s): "GLUCAP" in the last 168 hours. Lipid Profile: No results for input(s): "CHOL", "HDL", "LDLCALC", "TRIG", "CHOLHDL", "LDLDIRECT" in the last 72 hours. Thyroid Function Tests: No results for input(s): "TSH", "T4TOTAL", "FREET4", "T3FREE", "THYROIDAB" in the last 72 hours. Anemia Panel: No results for input(s): "VITAMINB12", "FOLATE", "FERRITIN", "TIBC", "IRON", "RETICCTPCT" in the last 72 hours. Urine analysis:    Component Value  Date/Time   COLORURINE YELLOW 03/20/2022 2313   APPEARANCEUR CLEAR 03/20/2022 2313   LABSPEC 1.018 03/20/2022 2313   PHURINE 8.0 03/20/2022 2313   GLUCOSEU NEGATIVE 03/20/2022 2313   HGBUR NEGATIVE 03/20/2022 2313   BILIRUBINUR NEGATIVE 03/20/2022 2313   KETONESUR 15 (A) 03/20/2022 2313   PROTEINUR TRACE (A) 03/20/2022 2313   NITRITE NEGATIVE 03/20/2022 2313   LEUKOCYTESUR NEGATIVE 03/20/2022 2313   Sepsis Labs: @LABRCNTIP (procalcitonin:4,lacticidven:4) )No results found for this or any previous visit (from the past 240 hour(s)).   Radiological Exams on Admission: No results found.   Assessment/Plan  This is a 57 y.o. female with a history of pancreatic cancer with mets to liver, off chemotherapy  now being admitted with:  #.  Abdominal pain and distention -  Admit to inpatient - Pain control - CT abd/pelvic with PO contrast to evaluate for possible gastric outlet obstruction and GI consult for consideration of stenting if needed - Palliative care orders placed  Admission status: Inpatient IV Fluids: HL Diet/Nutrition: Clear liquids Consults called: Palliative  DVT Px: Eliquis and early ambulation. Code Status: DNR Disposition Plan: TBD  All the records are reviewed and case discussed with ED provider. Management plans discussed with the patient and/or family who express understanding and agree with plan of care.  Tonye Royalty D.O. on 09/12/2022 at 6:54 PM CC: Primary care physician; Eartha Inch, MD   09/12/2022, 6:54 PM

## 2022-09-12 NOTE — Consult Note (Addendum)
Patient followed by palliative care outpatient. Requested direct admission for pain management and evaluation for gastric outlet obstruction. Worsening abdominal distention and pain over last few days-has not been able to get relief with outpatient interventions. No vomiting, but having nausea, passing gas and has had recent BM. Pain and bloating severe immediately after PO intake, worsening reflux. Patient moved from cancer center to [facility] bed in 1524. In order to expedite her comfort and admission, I have placed orders until hospitalist can admit and evaluate.  Acute Onset, Abdominal Pain and Distention in setting of Pancreatic Cancer, r/o gastric outlet obstruction Continue Fentanyl Patch 139mcg/q48hours Hydromorphone IV for breakthrough High dose PPI and H2 Blocker IV magnesium Scheduled Reglan Relistor daily for constipation Decadron at home dose Continue Eliquis at low dose Diet as tolerated- clears and easy to digest foods preferred. CT to evaluate for gastric outlet obstruction in setting of known tumor in this area.  May consider using octreotide for obstructive symptoms. Lidoderm patch to epigastric area Will treat for possible SIBO with Rifaximin for 5 days 14. If imaging suggests high risk for complete obstruction we could consider referral for endoscopic stenting.  Tara Jensen understands her disease is terminal but she has done remarkably well off chemotherapy and other treatment interventions after failing to respond to multiple lines of treatment. She has until past few days/recently been active and engaged in her life and living well. Our team has been assisting with pain and symptom management- including opioid titration, celiac plexus block and management of fatigue and GI symptoms.  Concern now for at least partial gastric outlet obstruction-she is very thin and has little room for distention of her abdomen -she reports the skin feeling very tight and painful. [place] did not show  significant ascites-but plain films showed distended stomach.  She is DNR with full scope medical interventions desired-she wants reversible disease treated and symptoms aggressively managed. We have discussed this previously-I reassured her that this would not impact any of her treatment options or care and would only apply if her heart stops and she stops breathing. She would not benefit from chest compressions at any point in the future given how thin and frail her body is currently and in the setting of metastatic cancer.   Will follow closely.  Anderson Malta, DO Palliative Medicine  Time: 75 minutes

## 2022-09-12 NOTE — Plan of Care (Signed)
Plan of Care Note for accepted transfer   Patient: Tara Jensen MRN: [id number]   DOA: (Not on file)  Facility requesting transfer: [facility] Requesting Provider: Anderson Malta, DO Reason for transfer: Gastric outlet obstruction Facility course: IV fluids given  Plan of care: The patient is accepted for admission to Med-surg  unit, at [facility]. Patient to be admitted as observation for IV analgesics and IV fluids. Palliative care is hoping GI can consider a stent to address gastric outlet obstruction. Patient follows with Dr. Loreta Ave as an outpatient.  Author: Jacquelin Hawking, MD 09/12/2022  Check [email or web address] for on-call coverage.  Nursing staff, Please call [facility] Admits & Consults System-Wide number on Amion as soon as patient's arrival, so appropriate admitting provider can evaluate the pt.

## 2022-09-12 NOTE — Patient Instructions (Signed)

## 2022-09-13 ENCOUNTER — Inpatient Hospital Stay (HOSPITAL_COMMUNITY): Payer: BC Managed Care – PPO

## 2022-09-13 ENCOUNTER — Encounter (HOSPITAL_COMMUNITY): Payer: Self-pay | Admitting: Family Medicine

## 2022-09-13 DIAGNOSIS — C787 Secondary malignant neoplasm of liver and intrahepatic bile duct: Secondary | ICD-10-CM | POA: Diagnosis not present

## 2022-09-13 DIAGNOSIS — C259 Malignant neoplasm of pancreas, unspecified: Secondary | ICD-10-CM | POA: Diagnosis not present

## 2022-09-13 DIAGNOSIS — R180 Malignant ascites: Secondary | ICD-10-CM | POA: Diagnosis not present

## 2022-09-13 DIAGNOSIS — R188 Other ascites: Secondary | ICD-10-CM

## 2022-09-13 DIAGNOSIS — R1084 Generalized abdominal pain: Secondary | ICD-10-CM | POA: Diagnosis not present

## 2022-09-13 MED ORDER — ALBUMIN HUMAN 25 % IV SOLN
12.5000 g | Freq: Four times a day (QID) | INTRAVENOUS | Status: DC
  Administered 2022-09-13 – 2022-09-17 (×15): 12.5 g via INTRAVENOUS
  Filled 2022-09-13 (×17): qty 50

## 2022-09-13 MED ORDER — SODIUM CHLORIDE (PF) 0.9 % IJ SOLN
INTRAMUSCULAR | Status: AC
  Filled 2022-09-13: qty 50

## 2022-09-13 MED ORDER — IOHEXOL 9 MG/ML PO SOLN
ORAL | Status: AC
  Filled 2022-09-13: qty 1000

## 2022-09-13 MED ORDER — DIAZEPAM 5 MG/ML IJ SOLN: 5.0000 mg | Freq: Two times a day (BID) | INTRAMUSCULAR | Status: DC | PRN

## 2022-09-13 MED ORDER — IOHEXOL 300 MG/ML  SOLN
75.0000 mL | Freq: Once | INTRAMUSCULAR | Status: AC | PRN
  Administered 2022-09-13: 75 mL via INTRAVENOUS

## 2022-09-13 MED ORDER — KETOROLAC TROMETHAMINE 15 MG/ML IJ SOLN
15.0000 mg | Freq: Three times a day (TID) | INTRAMUSCULAR | Status: DC
  Administered 2022-09-13 – 2022-09-17 (×11): 15 mg via INTRAVENOUS
  Filled 2022-09-13 (×11): qty 1

## 2022-09-13 MED ORDER — IOHEXOL 9 MG/ML PO SOLN
500.0000 mL | ORAL | Status: AC
  Administered 2022-09-13: 500 mL via ORAL

## 2022-09-13 MED ORDER — HEPARIN SOD (PORK) LOCK FLUSH 100 UNIT/ML IV SOLN
500.0000 [IU] | Freq: Once | INTRAVENOUS | Status: AC
  Administered 2022-09-13: 500 [IU] via INTRAVENOUS

## 2022-09-13 MED ORDER — ENSURE MAX PROTEIN PO LIQD
11.0000 [oz_av] | Freq: Three times a day (TID) | ORAL | Status: DC
  Administered 2022-09-14 – 2022-09-22 (×19): 11 [oz_av] via ORAL
  Filled 2022-09-13 (×30): qty 330

## 2022-09-13 MED ORDER — RIFAXIMIN 550 MG PO TABS
550.0000 mg | ORAL_TABLET | Freq: Three times a day (TID) | ORAL | Status: DC
  Administered 2022-09-13 – 2022-09-16 (×6): 550 mg via ORAL
  Filled 2022-09-13 (×10): qty 1

## 2022-09-13 MED ORDER — HEPARIN SOD (PORK) LOCK FLUSH 100 UNIT/ML IV SOLN
INTRAVENOUS | Status: AC
  Filled 2022-09-13: qty 5

## 2022-09-13 MED ORDER — SODIUM CHLORIDE 0.9 % IV SOLN
150.0000 ug/h | INTRAVENOUS | Status: DC
  Administered 2022-09-13 – 2022-09-15 (×3): 25 ug/h via INTRAVENOUS
  Administered 2022-09-16: 50 ug/h via INTRAVENOUS
  Administered 2022-09-17: 75 ug/h via INTRAVENOUS
  Administered 2022-09-17 (×2): 100 ug/h via INTRAVENOUS
  Filled 2022-09-13 (×11): qty 1

## 2022-09-13 MED ORDER — CALCIUM CARBONATE ANTACID 500 MG PO CHEW: 2.0000 | CHEWABLE_TABLET | Freq: Three times a day (TID) | ORAL | Status: DC | PRN

## 2022-09-13 MED ORDER — HYDROMORPHONE HCL 4 MG PO TABS
8.0000 mg | ORAL_TABLET | Freq: Three times a day (TID) | ORAL | Status: DC
  Administered 2022-09-13 – 2022-09-16 (×7): 8 mg via ORAL
  Filled 2022-09-13 (×3): qty 2
  Filled 2022-09-13: qty 4
  Filled 2022-09-13 (×5): qty 2

## 2022-09-13 NOTE — Hospital Course (Signed)
Tara Jensen is a 57 yo female with PMH stage IV pancreatic adenocarcinoma, depression/anxiety, chemo induced neuropathy, GERD who presented with abdominal pain, distension, and decreased appetite/nausea.  She is managed closely outpatient by oncology and palliative care for pain control.  Due to worsening symptoms, she was recommended for admission to the hospital for repeat CT abdomen/pelvis and further symptom control.

## 2022-09-13 NOTE — Assessment & Plan Note (Signed)
Continue Cymbalta.

## 2022-09-13 NOTE — Plan of Care (Signed)

## 2022-09-13 NOTE — Assessment & Plan Note (Addendum)
-   follows with Dr. Truett Perna and Dr. Phillips Odor  - remains off treatment per oncology; given CT report, still not a good treatment candidate nor would clinical trials likely make a meaningful change; she understands worsening prognosis with current scan and wishes to continue with palliative care and symptom control - discussed with oncology and palliative care - initial patient wish was for home with hospice but if continues to decline she may warrant residential hospice vs remaining inpatient

## 2022-09-13 NOTE — Progress Notes (Signed)
Transition of Care [facility]) - Inpatient Brief Assessment   Patient Details  Name: ARTHI MCDONALD MRN: [id number] Date of Birth: 1965-08-22  Transition of Care [facility]) CM/SW Contact:    Adrian Prows, RN Phone Number: 09/13/2022, 10:39 AM   Clinical Narrative: Sherron Monday w/ pt and friend Erskine Squibb in room; pt identified POC Cherlyn Labella [phone]); Brief assessment completed.   Transition of Care Asessment: Insurance and Status: Insurance coverage has been reviewed Patient has primary care physician: Yes Home environment has been reviewed: yes Prior level of function:: independent Prior/Current Home Services: No current home services Social Determinants of Health Reivew: SDOH reviewed no interventions necessary Readmission risk has been reviewed: Yes Transition of care needs: no transition of care needs at this time

## 2022-09-13 NOTE — Assessment & Plan Note (Addendum)
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  Previously has not been enough fluid for paracentesis but CT is noting fluid collection may be bigger - appreciate eval with IR; she underwent paracentesis on 8/3 with 1 L removed - cell count reviewed; negative for SBP. Will followup culture (remains negative) and cytology as well  - repeat para eval performed on 8/5 (not enough fluid to tap)

## 2022-09-13 NOTE — Progress Notes (Signed)
Palliative Medicine [facility]  [phone] [phone]   Name: Tara Jensen Date: 09/13/2022 MRN: [id number]  DOB: Jul 13, 1965  Patient Care Team: Medicine, [facility] as PCP - General (Family Medicine) Jerrell Mylar, Jacquelyne Balint, RN as Oncology Nurse Navigator Truett Perna, Leighton Roach, MD as Consulting Physician (Oncology)    INTERVAL HISTORY: Tara Jensen is a 57 y.o. female with oncologic medical history including pancreatic cancer (07/2021) metastatic to the liver and gastric wall, GERD, anxiety, depression, constipation, and chemo induced peripheral neuropathy. Palliative ask to see for symptom and pain management and goals of care.     SOCIAL HISTORY:     reports that she has never smoked. She has never used smokeless tobacco. She reports that she does not currently use alcohol. She reports that she does not use drugs.  ADVANCE DIRECTIVES:  On file  CODE STATUS: DNR  PAST MEDICAL HISTORY: Past Medical History:  Diagnosis Date   Abnormal Pap smear    Anxiety    "situational"   BV (bacterial vaginosis)    Cancer (HCC)    pancreatic   Cervical polyp    Depression    "situational"   Family history of adverse reaction to anesthesia    mother had N/V   Recurrent UTI (urinary tract infection)     ALLERGIES:  has No Known Allergies.  MEDICATIONS:  Current Facility-Administered Medications  Medication Dose Route Frequency Provider Last Rate Last Admin   HYDROmorphone (DILAUDID) injection 0.25 mg  0.25 mg Intravenous Once Anderson Malta L, DO       No current outpatient medications on file.   Facility-Administered Medications Ordered in Other Visits  Medication Dose Route Frequency Provider Last Rate Last Admin   0.9 %  sodium chloride infusion   Intravenous Continuous Hugelmeyer, Alexis, DO       apixaban (ELIQUIS) tablet 2.5 mg  2.5 mg Oral BID Hugelmeyer, Alexis, DO   2.5 mg at 09/13/22 0845   bisacodyl  (DULCOLAX) EC tablet 5 mg  5 mg Oral Daily PRN Hugelmeyer, Alexis, DO       Chlorhexidine Gluconate Cloth 2 % PADS 6 each  6 each Topical Daily Hugelmeyer, Alexis, DO   6 each at 09/13/22 0847   dexamethasone (DECADRON) tablet 4 mg  4 mg Oral Daily Hugelmeyer, Alexis, DO   4 mg at 09/13/22 0845   diazepam (VALIUM) tablet 1 mg  1 mg Oral Q12H PRN Hugelmeyer, Alexis, DO       DULoxetine (CYMBALTA) DR capsule 30 mg  30 mg Oral Daily Hugelmeyer, Alexis, DO   30 mg at 09/13/22 0846   fentaNYL (DURAGESIC) 100 MCG/HR 1 patch  1 patch Transdermal Q48H Hugelmeyer, Alexis, DO   1 patch at 09/12/22 2021   hydrALAZINE (APRESOLINE) injection 5 mg  5 mg Intravenous Q6H PRN Hugelmeyer, Alexis, DO       HYDROmorphone (DILAUDID) injection 1-2 mg  1-2 mg Intravenous Q2H PRN Hugelmeyer, Alexis, DO   1 mg at 09/12/22 1841   iohexol (OMNIPAQUE) 9 MG/ML oral solution 500 mL  500 mL Oral Q1H Golding, Elizabeth L, DO   500 mL at 09/13/22 0851   methylnaltrexone (RELISTOR) injection 12 mg  12 mg Subcutaneous Q24H Hugelmeyer, Alexis, DO   12 mg at 09/12/22 2020   metoCLOPramide (REGLAN) injection 5 mg  5 mg Intravenous Q6H Hugelmeyer, Alexis, DO   5 mg at 09/13/22 0649   modafinil (PROVIGIL) tablet 200 mg  200 mg Oral  Q breakfast Hugelmeyer, Alexis, DO       ondansetron (ZOFRAN) tablet 4 mg  4 mg Oral Q6H PRN Hugelmeyer, Alexis, DO       Or   ondansetron (ZOFRAN) injection 4 mg  4 mg Intravenous Q6H PRN Hugelmeyer, Alexis, DO       pantoprazole (PROTONIX) injection 40 mg  40 mg Intravenous Q12H Hugelmeyer, Alexis, DO   40 mg at 09/13/22 0845   protein supplement (ENSURE MAX) liquid  11 oz Oral BID Hugelmeyer, Alexis, DO       rifaximin (XIFAXAN) tablet 550 mg  550 mg Oral TID Hugelmeyer, Alexis, DO   550 mg at 09/13/22 0919   senna-docusate (Senokot-S) tablet 1 tablet  1 tablet Oral QHS PRN Hugelmeyer, Alexis, DO       sodium chloride flush (NS) 0.9 % injection 10-40 mL  10-40 mL Intracatheter PRN Hugelmeyer, Alexis, DO        sodium chloride flush (NS) 0.9 % injection 3 mL  3 mL Intravenous Q12H Hugelmeyer, Alexis, DO   3 mL at 09/13/22 0847   traZODone (DESYREL) tablet 25 mg  25 mg Oral QHS PRN Hugelmeyer, Alexis, DO       traZODone (DESYREL) tablet 50 mg  50 mg Oral QHS Hugelmeyer, Alexis, DO   50 mg at 09/12/22 2236    VITAL SIGNS: BP (!) 104/52   Pulse 72   Temp 98.2 F (36.8 C) (Oral)   Resp 18   LMP 11/01/2018   SpO2 100%  There were no vitals filed for this visit.  Estimated body mass index is 15.17 kg/m as calculated from the following:   Height as of 09/09/22: 5\' 10"  (1.778 m).   Weight as of 09/09/22: 105 lb 11.2 oz (47.9 kg).   PERFORMANCE STATUS (ECOG) : 2 - Symptomatic, <50% confined to bed    Physical Exam General: NAD, cachectic Cardiovascular: regular rate and rhythm Pulmonary: diminished Abdomen: firm, tender, distended, + bowel sounds Extremities: no edema, no joint deformities Skin: no rashes Neurological: AAO x4   IMPRESSION: Tara Jensen presents to clinic today for symptom management needs. Dr. Phillips Odor present during visit. October is extremely weak. Complaining of ongoing abdominal discomfort. She is tearful. Her aunt is present with her. Hypotensive on exam.   Per Discussions with myself, patient, and Dr. Phillips Odor due to patients unstable condition and expressed wishes for full scope care with only set limitations of DNR recommendation for hospital admission is warranted at this time. Given limited bed availability we will provide symptom management needs and get some labs while waiting here in the clinic for direct admission in collaboration with Hospitalist team. She and family verbalized understanding and appreciation.   Per discussions with confirmation received and patient has been approved for direct admission (Dr. Nettey/Dr. Phillips Odor). Bed ready @ 1745. Patient transported to unit via wheelchair by Maygan, RN and myself. Assisted to bathroom and into bed prior to leaving  room. Maygan, RN provided report to receiving RN. Dr. Phillips Odor on unit.   I discussed the importance of continued conversation with family and their medical providers regarding overall plan of care and treatment options, ensuring decisions are within the context of the patients values and GOCs.  PLAN:  Extensive goals of care discussions.  Advanced directives on file.  Ongoing support.  Symptom management support in clinic Hydromorphone 0.25mg  IV Reglan 10mg  IV LR 1L IV over 2 hrs (patient only received @ K[facility])  GI Cocktail Dr. Caleb Popp agreed to direct admission.  Patient to be admitted to 1524.  Dr. Mirian Mo to continue supporting patient throughout inpatient admission.    Patient expressed understanding and was in agreement with this plan. She also understands that She can call the clinic at any time with any questions, concerns, or complaints.   Any controlled substances utilized were prescribed in the context of palliative care. PDMP has been reviewed.    Visit consisted of counseling and education dealing with the complex and emotionally intense issues of symptom management and palliative care in the setting of serious and potentially life-threatening illness.Greater than 50%  of this time was spent counseling and coordinating care related to the above assessment and plan.  Willette Alma, AGPCNP-BC  Palliative Medicine Team/[facility]  *Please note that this is a verbal dictation therefore any spelling or grammatical errors are due to the "[employer]agon [employer] One" system interpretation.

## 2022-09-13 NOTE — Plan of Care (Signed)
  Problem: Education: Goal: Knowledge of General Education information will improve Description: Including pain rating scale, medication(s)/side effects and non-pharmacologic comfort measures Outcome: Progressing   Problem: Health Behavior/Discharge Planning: Goal: Ability to manage health-related needs will improve Outcome: Progressing   Problem: Clinical Measurements: Goal: Ability to maintain clinical measurements within normal limits will improve Outcome: Progressing Goal: Will remain free from infection Outcome: Progressing Goal: Diagnostic test results will improve Outcome: Progressing Goal: Respiratory complications will improve Outcome: Progressing Goal: Cardiovascular complication will be avoided Outcome: Progressing   Problem: Activity: Goal: Risk for activity intolerance will decrease Outcome: Progressing   Problem: Nutrition: Goal: Adequate nutrition will be maintained Outcome: Progressing   Problem: Coping: Goal: Level of anxiety will decrease Outcome: Progressing   Problem: Elimination: Goal: Will not experience complications related to bowel motility Outcome: Progressing Goal: Will not experience complications related to urinary retention Outcome: Progressing   Problem: Pain Managment: Goal: General experience of comfort will improve Outcome: Progressing   Problem: Safety: Goal: Ability to remain free from injury will improve Outcome: Progressing   Problem: Skin Integrity: Goal: Risk for impaired skin integrity will decrease Outcome: Progressing  Pt A/Ox4 on RA. Abd distended, taut, hyperactive. Pt claimed pain levels manageable overnight and denied PRN pain medication. Plan for an abdominal CT today. BPs soft.

## 2022-09-13 NOTE — Progress Notes (Signed)
Palliative Care  Progress Note   Pain and symptoms improved since yesterday. Less reflux and had large BM after relistor and felt some relief of pressure in abdomen. Still has worseinng pain in her back. CT from this AM shows progression. I met with Tara Jensen to discuss results and symptom management plan moving forward. For now maintain all current interventions. Will re-evaluate her after [place] para tomorrow.  Anderson Malta, DO Palliative Medicine

## 2022-09-13 NOTE — Progress Notes (Signed)
IP PROGRESS NOTE  Subjective:   Tara Jensen is well-known to me with a history of pancreas cancer.  She is currently maintained off of treatment for pancreas cancer.  She is followed by the palliative care service.  We have seen her over the past few weeks with abdominal distention.  An ultrasound on 08/26/2022 revealed no ascites.  A repeat ultrasound on 09/09/2022 revealed small volume ascites that could not be drained.  She reports progressive abdominal distention.  She was admitted yesterday for further evaluation.  She was evaluated by the palliative care team yesterday.  Tara Jensen is having bowel movements.  She reports intermittent nausea, but no vomiting.  Her pain is under adequate control with the current narcotic regimen.  Objective: Vital signs in last 24 hours: Blood pressure (!) 99/55, pulse 70, temperature 97.6 F (36.4 C), resp. rate 15, last menstrual period 11/01/2018, SpO2 98%.  Intake/Output from previous day: No intake/output data recorded.  Physical Exam: Lungs: Lungs clear bilaterally Cardiac: Regular rate and rhythm Abdomen: Distended, firm masslike fullness in the mid upper abdomen, bowel sounds are present, nontender, soft Extremities: No leg edema   Portacath/PICC-without erythema  Lab Results: Recent Labs    09/12/22 1349 09/13/22 0343  WBC 4.0 4.2  HGB 8.3* 7.8*  HCT 25.5* 25.2*  PLT 107* 100*    BMET Recent Labs    09/12/22 1349 09/13/22 0343  NA 140 136  K 3.9 4.1  CL 107 104  CO2 28 26  GLUCOSE 111* 142*  BUN 20 17  CREATININE 0.41* 0.49  CALCIUM 9.3 9.0    Lab Results  Component Value Date   BJY782 7,676 (H) 07/29/2022    Studies/Results: No results found.  Medications: I have reviewed the patient's current medications.  Assessment/Plan: Pancreas cancer, CT abdomen/pelvis 07/11/2021-irregular pancreas body mass with occlusion of the proximal splenic vein and SMV, SMV patent distally with small filling defect likely due to  thrombus, prominent subcentimeter left periotic lymph node.  Less than 180 degree abutment of the distal celiac, common hepatic, and splenic arteries, less than 180 degree abutment of the SMA CT chest 07/18/2021-no evidence of metastatic disease EUS 07/16/2021-extrinsic impression on the stomach at the posterior wall of the gastric body, no gross lesion in the duodenum, 45 x 36 mm pancreas body mass, abutment of the SMA, celiac trunk, and compression of the splenoportal confluence.  No malignant appearing lymph nodes, numerous venous collaterals adjacent to the portal vein, FNA biopsy-malignant cells consistent with adenocarcinoma, T4N0 by EUS Markedly elevated CA 19-9 Guardant360 07/31/2021-K-ras G12R, MSI high-not detected, ATM VUS Cycle 1 FOLFOX 08/08/2021 Cycle 2 FOLFIRINOX 08/22/2021 CT abdomen/pelvis at [facility] 08/31/2021-hypoattenuating liver lesions consistent with metastases, mass in the pancreas neck/body with greater than 100 degree encasement of the celiac axis and common hepatic artery.  Less than 180 degree abutment of the SMA, long segment occlusion of the portal splenic confluence, splenic vein occluded, multiple collateral vessels in the upper abdomen, prominent left periaortic node Cycle 3 FOLFIRINOX 09/05/2021 Cycle 4 FOLFIRINOX 09/19/2021 Cycle 5 FOLFIRINOX 10/03/2021 CTs 10/16/2021-decrease size of primary pancreas mass, hepatic metastases, and a suspicious retroperitoneal node, resolution of left supraclavicular/low jugular adenopathy compared to a CT chest 07/18/2021.  No evidence of disease progression. Cycle 6 FOLFIRINOX 10/17/2021 Cycle 7 FOLFIRINOX 10/31/2021 Cycle 8 FOLFIRINOX 11/14/2021 Cycle 9 FOLFIRINOX 11/28/2021 Cycle 10 FOLFIRINOX 12/12/2021, oxaliplatin held secondary to neuropathy symptoms CTs 12/20/2021-decrease size of pancreas primary, no evidence of hepatic metastases, no new or progressive disease Cycle 11  FOLFIRINOX 12/27/2021, oxaliplatin held due to neuropathy symptoms Cycle 12  FOLFIRINOX 01/09/2022, oxaliplatin held 01/22/2022-C19-9 higher, 530 Cycle 13 FOLFIRINOX 01/24/2022, oxaliplatin resumed Cycle 14 FOLFIRINOX 02/06/2022 CTs 02/23/2022-increased size of the pancreas body mass new focal gastric wall thickening and loss of fat plane between the mass and the proximal gastric wall, no evidence of new or progressive metastatic disease Cycle 1 gemcitabine/Abraxane 03/06/2022 Cycle 2 gemcitabine/Abraxane 03/20/2022 CT abdomen/pelvis was 03/20/2022-slight interval increase in size of the pancreas neck/body mass with extension to the posterior gastric antrum Cycle 3 gemcitabine/Abraxane 04/04/2022-dose reduction secondary to thrombocytopenia CT abdomen/pelvis 04/14/2021-increase size of the pancreas neck/body mass with probable involvement of the gastric wall, progressive encasement of the celiac artery with chronic involvement of the portal splenic confluence MRI abdomen 05/24/2022-increase in size of the pancreas neck/body mass with vascular encasement and new thrombus in the extrahepatic portal vein, new small peripheral enhancing structure in segment 2 of the liver concerning for a metastasis, trace ascites, large stool burden Xeloda/radiation 06/10/2022, discontinued after 7 treatments secondary to severe nausea CT abdomen/pelvis 07/25/2022-increased pancreas mass-inseparable from the posterior gastric antrum, new and enlarging hepatic metastases, small volume ascites, small lower lung nodules-indeterminate Pain secondary to #1-Dilaudid drip started 04/18/2022, converted to a fentanyl drip followed by a fentanyl patch and oral Dilaudid for breakthrough pain Celiac plexus block 03/11/2022 Celiac plexus block 04/22/2022 Weight loss secondary to #1 History of situational depression Family history of breast cancer 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  She is maintained off of specific therapy for pancreas cancer.  She is now admitted with increased abdominal distention.  A CT 07/25/2022 and ultrasounds in July have not revealed significant ascites.  However the physical exam is most consistent with ascites.  The differential diagnosis includes gaseous distention of the bowel secondary to ileus.  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  I agree with the plan for a CT abdomen/pelvis to look for ascites not appreciated by ultrasound, bowel/stomach distention.  She should continue the pain regimen as recommended by Dr. Phillips Odor.  Ms. Barcus understands systemic treatment options for the pancreas cancer are limited.  We will continue to investigate clinical trial options as her performance status allows.  Recommendations: CT abdomen/pelvis Paracentesis as indicated pending the CT findings Narcotic analgesics for pain Bowel regimen  5.  Please call oncology as needed, I will check on her over the weekend    LOS: 1 day   Thornton Papas, MD   09/13/2022, 6:44 AM

## 2022-09-13 NOTE — Progress Notes (Signed)
Progress Note    Tara Jensen   WUJ:[id number]  DOB: [birth year]  DOA: 09/12/2022     1 PCP: Medicine, [facility]  Initial CC: abdominal pain/distension   Hospital Course: [name] is a 57 yo female with PMH stage IV pancreatic adenocarcinoma, depression/anxiety, chemo induced neuropathy, GERD who presented with abdominal pain, distension, and decreased appetite/nausea.  She is managed closely outpatient by oncology and palliative care for pain control.  Due to worsening symptoms, she was recommended for admission to the hospital for repeat CT abdomen/pelvis and further symptom control.   Interval History:  Resting comfortably this morning. Patient well known to me from prior hospitalization in March. Remains pleasant but appropriately concerned about situation.  Reviewed findings of CT with her this afternoon as well.   Assessment and Plan: * Abdominal pain - likely multifactorial etiology: pancreatic cancer with prior severe abd pain flares, neuropathy, and now increased distension with nausea/poor intake. Prior CT 6/13 noted with interval increase of mass and new/enlarging hepatic lesions - at risk for GOO as well as further metastatic spread - agree with CT A/P.  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  Previously has not been enough fluid for paracentesis but CT is noting fluid collection may be bigger -Will ask for IR to evaluate for paracentesis to see if it gives any relief of abdominal distention - Also brought up possible Pleurx catheter for the future if fluid reaccumulate's rapidly and/or if she is a candidate for it.  She plans to think about this and discuss further with palliative care as well  Pancreatic cancer  metastasized to liver [facility]) - follows with Dr. Truett Jensen and Dr. Phillips Jensen  - outpatient pain regimen noted; there was mention of pain pump possibly if pain did become worse/uncontrolled at last OV on 7/15 - remains off treatment per oncology; given CT report, still not a good treatment candidate nor would clinical trials likely make a meaningful change; she understands worsening prognosis with current scan and wishes to continue with palliative care and symptom control - discussed with oncology and palliative care  Depression - Continue Cymbalta   Old records reviewed in assessment of this patient  Antimicrobials:   DVT prophylaxis:  apixaban (ELIQUIS) tablet 2.5 mg Start: 09/12/22 2200 apixaban (ELIQUIS) tablet 2.5 mg   Code Status:   Code Status: DNR  Mobility Assessment (Last 72 Hours)     Mobility Assessment     Row Name 09/13/22 0850 09/12/22 2020 09/12/22 1800       Does patient have an order for bedrest or is patient medically unstable No - Continue assessment No - Continue assessment No - Continue assessment     What is the highest level of mobility based on the progressive mobility assessment? Level 5 (Walks with assist in room/hall) - Balance while stepping forward/back and can walk in room with assist - Complete Level 5 (Walks with assist in room/hall) - Balance while stepping forward/back and can walk in room with assist - Complete Level 5 (Walks with assist in room/hall) - Balance while stepping forward/back and can walk in room with assist - Complete             Mobility Assessment (Last 72 Hours)     Mobility Assessment  Row Name 09/13/22 0850 09/12/22 2020 09/12/22 1800       Does patient have an order for bedrest or is patient medically unstable No - Continue assessment No - Continue assessment No - Continue assessment     What is the highest level of mobility based on the progressive mobility assessment? Level 5 (Walks with assist in room/hall) - Balance  while stepping forward/back and can walk in room with assist - Complete Level 5 (Walks with assist in room/hall) - Balance while stepping forward/back and can walk in room with assist - Complete Level 5 (Walks with assist in room/hall) - Balance while stepping forward/back and can walk in room with assist - Complete              Barriers to discharge: none Disposition Plan:  Home Status is: Inpt  Objective: Blood pressure (!) 99/55, pulse 70, temperature 97.6 F (36.4 C), resp. rate 15, last menstrual period 11/01/2018, SpO2 98%.  Examination:  Physical Exam Constitutional:      General: She is not in acute distress.    Appearance: Normal appearance. She is not ill-appearing.  HENT:     Head: Normocephalic and atraumatic.     Mouth/Throat:     Mouth: Mucous membranes are moist.  Eyes:     Extraocular Movements: Extraocular movements intact.  Cardiovascular:     Rate and Rhythm: Normal rate and regular rhythm.  Pulmonary:     Effort: Pulmonary effort is normal. No respiratory distress.     Breath sounds: Normal breath sounds. No wheezing.  Abdominal:     General: Bowel sounds are decreased. There is distension.     Palpations: Abdomen is soft.     Tenderness: There is abdominal tenderness (nonspecific).  Musculoskeletal:        General: No swelling. Normal range of motion.     Cervical back: Normal range of motion and neck supple.  Skin:    General: Skin is warm and dry.  Neurological:     General: No focal deficit present.     Mental Status: She is alert.  Psychiatric:        Mood and Affect: Mood normal.        Behavior: Behavior normal.      Consultants:  Oncology Palliative care  Procedures:    Data Reviewed: Results for orders placed or performed during the hospital encounter of 09/12/22 (from the past 24 hour(s))  HIV Antibody (routine testing w rflx)     Status: None   Collection Time: 09/12/22  7:30 PM  Result Value Ref Range   HIV Screen 4th  Generation wRfx Non Reactive Non Reactive  Comprehensive metabolic panel     Status: Abnormal   Collection Time: 09/13/22  3:43 AM  Result Value Ref Range   Sodium 136 135 - 145 mmol/L   Potassium 4.1 3.5 - 5.1 mmol/L   Chloride 104 98 - 111 mmol/L   CO2 26 22 - 32 mmol/L   Glucose, Bld 142 (H) 70 - 99 mg/dL   BUN 17 6 - 20 mg/dL   Creatinine, Ser 1.61 0.44 - 1.00 mg/dL   Calcium 9.0 8.9 - 09.6 mg/dL   Total Protein 5.9 (L) 6.5 - 8.1 g/dL   Albumin 2.7 (L) 3.5 - 5.0 g/dL   AST 22 15 - 41 U/L   ALT 23 0 - 44 U/L   Alkaline Phosphatase 93 38 - 126 U/L   Total Bilirubin 0.5 0.3 - 1.2 mg/dL  GFR, Estimated >60 >60 mL/min   Anion gap 6 5 - 15  CBC     Status: Abnormal   Collection Time: 09/13/22  3:43 AM  Result Value Ref Range   WBC 4.2 4.0 - 10.5 K/uL   RBC 2.55 (L) 3.87 - 5.11 MIL/uL   Hemoglobin 7.8 (L) 12.0 - 15.0 g/dL   HCT 82.9 (L) 56.2 - 13.0 %   MCV 98.8 80.0 - 100.0 fL   MCH 30.6 26.0 - 34.0 pg   MCHC 31.0 30.0 - 36.0 g/dL   RDW 86.5 78.4 - 69.6 %   Platelets 100 (L) 150 - 400 K/uL   nRBC 0.0 0.0 - 0.2 %    I have reviewed pertinent nursing notes, vitals, labs, and images as necessary. I have ordered labwork to follow up on as indicated.  I have reviewed the last notes from staff over past 24 hours. I have discussed patient's care plan and test results with nursing staff, CM/SW, and other staff as appropriate.  Time spent: Greater than 50% of the 55 minute visit was spent in counseling/coordination of care for the patient as laid out in the A&P.   LOS: 1 day   Lewie Chamber, MD [facility] 09/13/2022, 6:02 PM

## 2022-09-13 NOTE — Assessment & Plan Note (Addendum)
-   likely multifactorial etiology: pancreatic cancer with prior severe abd pain flares, neuropathy, and now increased distension with nausea/poor intake. Prior CT 6/13 noted with interval increase of mass and new/enlarging hepatic lesions - at risk for GOO as well as further metastatic spread - s/p CT A/P.  (significant progression of disease--see full body of report for extensive summary) - appetite labile now; okay to eat as she pleases; intake now decreasing and bowel sounds becoming decreased; concerned for imminent decline - continue nausea and pain control; deferring management to Dr. Phillips Odor - abdomen showing worsening distension and firmness today, 8/6; obtained abd xray and minimal gas pattern likely b/c of worsening underlying obstruction; at this time best option is for ongoing symptom management and making her as comfortable as possible which is what is being done. Greatly appreciate palliative care assistance

## 2022-09-14 ENCOUNTER — Inpatient Hospital Stay (HOSPITAL_COMMUNITY): Payer: BC Managed Care – PPO

## 2022-09-14 DIAGNOSIS — C259 Malignant neoplasm of pancreas, unspecified: Secondary | ICD-10-CM | POA: Diagnosis not present

## 2022-09-14 DIAGNOSIS — C787 Secondary malignant neoplasm of liver and intrahepatic bile duct: Secondary | ICD-10-CM | POA: Diagnosis not present

## 2022-09-14 DIAGNOSIS — R1084 Generalized abdominal pain: Secondary | ICD-10-CM | POA: Diagnosis not present

## 2022-09-14 DIAGNOSIS — R180 Malignant ascites: Secondary | ICD-10-CM | POA: Diagnosis not present

## 2022-09-14 LAB — BODY FLUID CELL COUNT WITH DIFFERENTIAL
Eos, Fluid: 0 %
Lymphs, Fluid: 48 %
Monocyte-Macrophage-Serous Fluid: 44 % — ABNORMAL LOW (ref 50–90)
Neutrophil Count, Fluid: 8 % (ref 0–25)
Total Nucleated Cell Count, Fluid: 168 cu mm (ref 0–1000)

## 2022-09-14 LAB — GRAM STAIN

## 2022-09-14 MED ORDER — DEXAMETHASONE 4 MG PO TABS
8.0000 mg | ORAL_TABLET | Freq: Every day | ORAL | Status: DC
  Administered 2022-09-15 – 2022-09-16 (×2): 8 mg via ORAL
  Filled 2022-09-14 (×2): qty 2

## 2022-09-14 MED ORDER — ALUM & MAG HYDROXIDE-SIMETH 200-200-20 MG/5ML PO SUSP
30.0000 mL | Freq: Four times a day (QID) | ORAL | Status: DC | PRN
  Administered 2022-09-14: 30 mL via ORAL

## 2022-09-14 MED ORDER — ONDANSETRON HCL 4 MG/2ML IJ SOLN: 4.0000 mg | Freq: Four times a day (QID) | INTRAMUSCULAR | Status: DC | PRN

## 2022-09-14 MED ORDER — NALOXONE HCL 0.4 MG/ML IJ SOLN: 0.4000 mg | INTRAMUSCULAR | Status: DC | PRN

## 2022-09-14 MED ORDER — DIPHENHYDRAMINE HCL 12.5 MG/5ML PO ELIX: 12.5000 mg | ORAL_SOLUTION | Freq: Four times a day (QID) | ORAL | Status: DC | PRN

## 2022-09-14 MED ORDER — HYDROMORPHONE 1 MG/ML IV SOLN
INTRAVENOUS | Status: DC
  Administered 2022-09-14: 1 mg via INTRAVENOUS
  Administered 2022-09-14: 30 mg via INTRAVENOUS
  Administered 2022-09-15: 1 mL via INTRAVENOUS
  Administered 2022-09-15: 3 mg via INTRAVENOUS
  Administered 2022-09-15: 1 mg via INTRAVENOUS
  Administered 2022-09-15: 2 mg via INTRAVENOUS
  Administered 2022-09-15: 0.01 mg via INTRAVENOUS
  Administered 2022-09-16: 1 mg via INTRAVENOUS
  Administered 2022-09-16: 2 mg via INTRAVENOUS
  Administered 2022-09-16: 1 mg via INTRAVENOUS
  Administered 2022-09-16: 3 mg via INTRAVENOUS
  Filled 2022-09-14: qty 30

## 2022-09-14 MED ORDER — DEXAMETHASONE SODIUM PHOSPHATE 4 MG/ML IJ SOLN
4.0000 mg | Freq: Once | INTRAMUSCULAR | Status: AC
  Administered 2022-09-14: 4 mg via INTRAVENOUS
  Filled 2022-09-14: qty 1

## 2022-09-14 MED ORDER — SODIUM CHLORIDE 0.9% FLUSH: 9.0000 mL | INTRAVENOUS | Status: DC | PRN

## 2022-09-14 MED ORDER — LIDOCAINE HCL 1 % IJ SOLN
INTRAMUSCULAR | Status: AC
  Filled 2022-09-14: qty 20

## 2022-09-14 MED ORDER — DIPHENHYDRAMINE HCL 50 MG/ML IJ SOLN: 12.5000 mg | Freq: Four times a day (QID) | INTRAMUSCULAR | Status: DC | PRN

## 2022-09-14 NOTE — Plan of Care (Signed)

## 2022-09-14 NOTE — Procedures (Signed)
PROCEDURE SUMMARY:  Successful image-guided paracentesis from the left lower abdomen.  Yielded 1 liters of hazy yellow fluid.  No immediate complications.  EBL = trace. Patient tolerated well.   Specimen was  sent for labs.  Please see imaging section of Epic for full dictation.    H  PA-C 09/14/2022 10:14 AM

## 2022-09-14 NOTE — Progress Notes (Signed)
IP PROGRESS NOTE  Subjective:   Tara Jensen underwent a paracentesis this morning.  The abdominal distention is partially improved.  She continues to have abdomen and back pain.  She reports the current narcotic regimen is helping.  Her aunt is at the bedside.  She has discussed the CT abdomen/pelvis findings with Dr. Phillips Odor and Dr. Frederick Peers.  Objective: Vital signs in last 24 hours: Blood pressure 120/65, pulse 79, temperature 98 F (36.7 C), temperature source Oral, resp. rate 16, last menstrual period 11/01/2018, SpO2 97%.  Intake/Output from previous day: 08/02 0701 - 08/03 0700 In: 459.6 [I.V.:359.6; IV Piggyback:100] Out: -   Physical Exam: Close skeletal: No spine tenderness Abdomen: Distended, fullness in the left abdomen Extremities: No leg edema   Portacath/PICC-without erythema  Lab Results: Recent Labs    09/13/22 0343 09/14/22 0350  WBC 4.2 3.1*  HGB 7.8* 7.5*  HCT 25.2* 23.2*  PLT 100* 106*    BMET Recent Labs    09/13/22 0343 09/14/22 0350  NA 136 133*  K 4.1 3.6  CL 104 101  CO2 26 25  GLUCOSE 142* 132*  BUN 17 16  CREATININE 0.49 0.46  CALCIUM 9.0 9.1    Lab Results  Component Value Date   CAN199 7,676 (H) 07/29/2022    Studies/Results: US Paracentesis  Result Date: 09/14/2022 INDICATION: 56 year old female with history of pancreatic cancer presents with ascites seen on previous CT. Request for therapeutic and diagnostic paracentesis. EXAM: ULTRASOUND GUIDED  PARACENTESIS MEDICATIONS: 8 mL 1% lidocaine COMPLICATIONS: None immediate. PROCEDURE: Informed written consent was obtained from the patient after a discussion of the risks, benefits and alternatives to treatment. A timeout was performed prior to the initiation of the procedure. Initial ultrasound scanning demonstrates a small amount of ascites within the left lower abdominal quadrant. The left lower abdomen was prepped and draped in the usual sterile fashion. 1% lidocaine was used for  local anesthesia. Following this, a 19 gauge, 7-cm, Yueh catheter was introduced. An ultrasound image was saved for documentation purposes. The paracentesis was performed. The catheter was removed and a dressing was applied. The patient tolerated the procedure well without immediate post procedural complication. FINDINGS: A total of approximately 1 L of hazy yellow fluid was removed. Samples were sent to the laboratory as requested by the clinical team. IMPRESSION: Successful ultrasound-guided paracentesis yielding 1 liters of peritoneal fluid. Performed by: Lawernce Ion, PA-C Electronically Signed   By: Acquanetta Belling M.D.   On: 09/14/2022 10:38   CT ABDOMEN PELVIS W CONTRAST  Result Date: 09/13/2022 CLINICAL DATA:  Metastatic disease evaluation. Question gastric outlet obstruction. Pancreatic cancer. * Tracking Code: BO * EXAM: CT ABDOMEN AND PELVIS WITH CONTRAST TECHNIQUE: Multidetector CT imaging of the abdomen and pelvis was performed using the standard protocol following bolus administration of intravenous contrast. RADIATION DOSE REDUCTION: This exam was performed according to the departmental dose-optimization program which includes automated exposure control, adjustment of the mA and/or kV according to patient size and/or use of iterative reconstruction technique. CONTRAST:  75mL OMNIPAQUE IOHEXOL 300 MG/ML  SOLN COMPARISON:  07/25/2022 FINDINGS: Lower chest: 4 mm right middle lobe nodule on image 10/series 4 is new since chest CT of 02/23/2022. Additional tiny nodules in the lung bases measure in the 3-4 mm size range, also new since January of this year. Some of these nodules are seen on the 07/25/2022 study and were only miniscule at that time consistent with progression in the interval. Tiny left pleural effusion noted. Hepatobiliary: Index left  hepatic lobe lesion measures 3.9 x 2.6 cm today compared to 3.0 x 1.8 cm previously. 1.9 cm lesion medial right liver on 23/2 was not definitely seen on the  previous noncontrast exam but is new since previous contrast infused study of 04/15/2022. Additional small lesions in the dome of the liver, lateral segment left liver, and inferior right liver are also new since 04/15/2022. Gallbladder is nondistended. No intrahepatic or extrahepatic biliary dilation. Pancreas: Pancreatic mass measures 9.7 x 5.2 cm today, increased from 8.4 x 5.6 cm previously. There is gas in the central necrotic portion of the study, potentially necrosis although the lesion appears to invade the posterior wall the gastric antrum which may also account for the intralesional gas. Lesion encases and attenuates the celiac axis, common hepatic artery, and splenic artery although these vessels do opacify. SMA is also encased but opacifies throughout suggesting persistent patency. There is a 12 mm enhancing focus immediately anterior to the SMA in the central portion of the pancreatic mass. This could be a nodule along the course of the SMA. Pseudoaneurysm is considered less likely but not excluded. Spleen: No splenomegaly. No suspicious focal mass lesion. Adrenals/Urinary Tract: No adrenal nodule or mass. Kidneys unremarkable. No evidence for hydroureter. Bladder is decompressed. Stomach/Bowel: Stomach is nondistended. As noted above, gastric mass appears to invade the posterior wall the gastric antrum. Pancreatic mass generates substantial mass-effect on the duodenum although no findings to suggest duodenal obstruction. No small bowel wall thickening. No small bowel dilatation. No gross colonic mass. No colonic wall thickening. Vascular/Lymphatic: No abdominal aortic aneurysm. No abdominal aortic atherosclerotic calcification. Main portal vein is patent with substantial mass-effect on the portal splenic confluence as before, splenic vein is occluded with extensive collateralization in the abdomen. Posterior extent of the pancreatic mass markedly attenuates the crossing left renal vein and patency of  this structure cannot be confirmed. No discrete retroperitoneal or pelvic sidewall lymphadenopathy. Reproductive: Unremarkable. Other: Large volume ascites is new in the interval. Irregular soft tissue nodules are seen in the cul-de-sac including 2.1 x 1.6 cm enhancing nodule on 87/2. Musculoskeletal: Subtle lytic lesion in the L2 vertebral body measures 2.3 x 2.4 cm, new in the interval and compatible with metastatic disease. IMPRESSION: 1. Interval progression of disease with marked interval enlargement of the pancreatic mass, enlarging hepatic metastases, new and progressive pulmonary metastases and a new bone metastasis at L2. This progression is associated with new large volume ascites and drop metastases in the cul-de-sac. 2. Pancreatic mass appears to invade the posterior wall the gastric antrum. The mass does generate mass-effect on the stomach and duodenum without an overt obstructive appearance at this time. 3. 12 mm enhancing focus immediately anterior to the SMA in the central portion of the pancreatic mass. This could be a nodule along the course of the SMA. SMA pseudoaneurysm is considered less likely but not excluded. As detailed above, the pancreatic mass encases the celiac axis and SMA. 4. 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 Marked attenuation of the crossing left renal vein by the pancreatic mass. Patency of this structure cannot be confirmed although renal perfusion is symmetric without findings of vascular congestion in the left kidney. 6. Tiny left pleural effusion. Electronically Signed   By: Kennith Center M.D.   On: 09/13/2022 16:27    Medications: I have reviewed the patient's current medications.  Assessment/Plan: Pancreas cancer, CT abdomen/pelvis 07/11/2021-irregular pancreas body mass with occlusion of the proximal splenic vein and SMV, SMV  patent distally with small filling defect likely due to thrombus, prominent subcentimeter left  periotic lymph node.  Less than 180 degree abutment of the distal celiac, common hepatic, and splenic arteries, less than 180 degree abutment of the SMA CT chest 07/18/2021-no evidence of metastatic disease EUS 07/16/2021-extrinsic impression on the stomach at the posterior wall of the gastric body, no gross lesion in the duodenum, 45 x 36 mm pancreas body mass, abutment of the SMA, celiac trunk, and compression of the splenoportal confluence.  No malignant appearing lymph nodes, numerous venous collaterals adjacent to the portal vein, FNA biopsy-malignant cells consistent with adenocarcinoma, T4N0 by EUS Markedly elevated CA 19-9 [employer] 07/31/2021-K-ras G12R, MSI high-not detected, ATM VUS Cycle 1 FOLFOX 08/08/2021 Cycle 2 FOLFIRINOX 08/22/2021 CT abdomen/pelvis at [facility] 08/31/2021-hypoattenuating liver lesions consistent with metastases, mass in the pancreas neck/body with greater than 100 degree encasement of the celiac axis and common hepatic artery.  Less than 180 degree abutment of the SMA, long segment occlusion of the portal splenic confluence, splenic vein occluded, multiple collateral vessels in the upper abdomen, prominent left periaortic node Cycle 3 FOLFIRINOX 09/05/2021 Cycle 4 FOLFIRINOX 09/19/2021 Cycle 5 FOLFIRINOX 10/03/2021 CTs 10/16/2021-decrease size of primary pancreas mass, hepatic metastases, and a suspicious retroperitoneal node, resolution of left supraclavicular/low jugular adenopathy compared to a CT chest 07/18/2021.  No evidence of disease progression. Cycle 6 FOLFIRINOX 10/17/2021 Cycle 7 FOLFIRINOX 10/31/2021 Cycle 8 FOLFIRINOX 11/14/2021 Cycle 9 FOLFIRINOX 11/28/2021 Cycle 10 FOLFIRINOX 12/12/2021, oxaliplatin held secondary to neuropathy symptoms CTs 12/20/2021-decrease size of pancreas primary, no evidence of hepatic metastases, no new or progressive disease Cycle 11 FOLFIRINOX 12/27/2021, oxaliplatin held due to neuropathy symptoms Cycle 12 FOLFIRINOX 01/09/2022, oxaliplatin  held 01/22/2022-C19-9 higher, 530 Cycle 13 FOLFIRINOX 01/24/2022, oxaliplatin resumed Cycle 14 FOLFIRINOX 02/06/2022 CTs 02/23/2022-increased size of the pancreas body mass new focal gastric wall thickening and loss of fat plane between the mass and the proximal gastric wall, no evidence of new or progressive metastatic disease Cycle 1 gemcitabine/Abraxane 03/06/2022 Cycle 2 gemcitabine/Abraxane 03/20/2022 CT abdomen/pelvis was 03/20/2022-slight interval increase in size of the pancreas neck/body mass with extension to the posterior gastric antrum Cycle 3 gemcitabine/Abraxane 04/04/2022-dose reduction secondary to thrombocytopenia CT abdomen/pelvis 04/14/2021-increase size of the pancreas neck/body mass with probable involvement of the gastric wall, progressive encasement of the celiac artery with chronic involvement of the portal splenic confluence MRI abdomen 05/24/2022-increase in size of the pancreas neck/body mass with vascular encasement and new thrombus in the extrahepatic portal vein, new small peripheral enhancing structure in segment 2 of the liver concerning for a metastasis, trace ascites, large stool burden Xeloda/radiation 06/10/2022, discontinued after 7 treatments secondary to severe nausea CT abdomen/pelvis 07/25/2022-increased pancreas mass-inseparable from the posterior gastric antrum, new and enlarging hepatic metastases, small volume ascites, small lower lung nodules-indeterminate CT abdomen/pelvis 09/13/2022-enlargement of the pancreas mass, lung lesions, and hepatic metastases.  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  She is maintained off of specific therapy for pancreas cancer.  She is now admitted with increased abdominal distention.  A CT abdomen/pelvis 09/13/2022 confirmed disease progression in multiple sites and new large volume ascites.  She underwent a palliative paracentesis earlier today.  I reviewed the CT findings with her today.  She has progressive metastatic pancreas cancer.  I suspect the abdomen/back pain or enlarged part related to the primary tumor.  It is possible the L2 metastasis is contributing.  Nausea is likely secondary to the primary tumor with invasion of the stomach, liver metastases, and carcinomatosis.  I agree with home hospice care.  She has discussed this plan with Dr. Phillips Odor.  She can be followed by the home hospice team with Dr. Phillips Odor serving as the primary provider, or I will be glad to assist with the hospice care.  She has anemia secondary to chronic disease malnutrition, and possibly clinical GI blood loss.  I would discontinue checking labs.  The leukopenia/thrombocytopenia are likely secondary to portal hypertension. Recommendations: [facility] consult for home hospice care Repeat palliative paracentesis as needed Narcotic analgesics for pain Bowel regimen  5.  Please call oncology as needed    LOS: 2 days   Thornton Papas, MD   09/14/2022, 1:32 PM

## 2022-09-14 NOTE — Progress Notes (Signed)
Progress Note    Tara Jensen   SWF:[id number]  DOB: 1965-04-22  DOA: 09/12/2022     2 PCP: Medicine, [facility]  Initial CC: abdominal pain/distension   Hospital Course: Tara Jensen is a 57 yo female with PMH stage IV pancreatic adenocarcinoma, depression/anxiety, chemo induced neuropathy, GERD who presented with abdominal pain, distension, and decreased appetite/nausea.  She is managed closely outpatient by oncology and palliative care for pain control.  Due to worsening symptoms, she was recommended for admission to the hospital for repeat CT abdomen/pelvis and further symptom control.   Interval History:  Moved rooms overnight; sleepy this morning.  Was able to undergo paracentesis this morning, 1L removed per note. Hopefully this provided some relief.  Overall plan remains to treat symptoms and keep her comfortable as best as able. She's met with Dr. Phillips Odor and Dr. Truett Perna.  We might work on discharge home tomorrow depending on how she's feeling and further discussion with palliative care regarding next steps.   Assessment and Plan: * Abdominal pain - likely multifactorial etiology: pancreatic cancer with prior severe abd pain flares, neuropathy, and now increased distension with nausea/poor intake. Prior CT 6/13 noted with interval increase of mass and new/enlarging hepatic lesions - at risk for GOO as well as further metastatic spread - agree with CT A/P.  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  Previously has not been enough fluid for paracentesis but CT is noting fluid collection may be bigger - appreciate eval with IR; she underwent paracentesis on 8/3 with 1 L removed - cell count reviewed; negative for SBP. Will followup  culture and cytology as well  - continue intermittent surveillance in the outpatient setting for repeat paracentesis as needed for relief; depending on timing of re-accumulation, consideration could be given to pleurX as well but drainage above was not as large as expected   Pancreatic cancer metastasized to liver [facility]) - follows with Dr. Truett Perna and Dr. Phillips Odor  - outpatient pain regimen noted; there was mention of pain pump possibly if pain did become worse/uncontrolled at last OV on 7/15 - remains off treatment per oncology; given CT report, still not a good treatment candidate nor would clinical trials likely make a meaningful change; she understands worsening prognosis with current scan and wishes to continue with palliative care and symptom control - discussed with oncology and palliative care  Depression - Continue Cymbalta   Old records reviewed in assessment of this patient  Antimicrobials:   DVT prophylaxis:     Code Status:   Code Status: DNR  Mobility Assessment (Last 72 Hours)     Mobility Assessment     Row Name 09/13/22 2200 09/13/22 1948 09/13/22 0850 09/12/22 2020 09/12/22 1800   Does patient have an order for bedrest or is patient medically unstable No - Continue assessment No - Continue assessment No - Continue assessment No - Continue assessment No - Continue assessment   What is the highest level of mobility based on the progressive mobility assessment? Level 5 (Walks with assist in room/hall) - Balance while stepping forward/back and can walk in room with assist - Complete Level 5 (Walks with assist in room/hall) - Balance while stepping forward/back and can walk in room with assist - Complete Level 5 (  Walks with assist in room/hall) - Balance while stepping forward/back and can walk in room with assist - Complete Level 5 (Walks with assist in room/hall) - Balance while stepping forward/back and can walk in room with assist - Complete Level 5 (Walks with assist in  room/hall) - Balance while stepping forward/back and can walk in room with assist - Complete           Mobility Assessment (Last 72 Hours)     Mobility Assessment     Row Name 09/13/22 2200 09/13/22 1948 09/13/22 0850 09/12/22 2020 09/12/22 1800   Does patient have an order for bedrest or is patient medically unstable No - Continue assessment No - Continue assessment No - Continue assessment No - Continue assessment No - Continue assessment   What is the highest level of mobility based on the progressive mobility assessment? Level 5 (Walks with assist in room/hall) - Balance while stepping forward/back and can walk in room with assist - Complete Level 5 (Walks with assist in room/hall) - Balance while stepping forward/back and can walk in room with assist - Complete Level 5 (Walks with assist in room/hall) - Balance while stepping forward/back and can walk in room with assist - Complete Level 5 (Walks with assist in room/hall) - Balance while stepping forward/back and can walk in room with assist - Complete Level 5 (Walks with assist in room/hall) - Balance while stepping forward/back and can walk in room with assist - Complete           Mobility Assessment (Last 72 Hours)     Mobility Assessment     Row Name 09/13/22 2200 09/13/22 1948 09/13/22 0850 09/12/22 2020 09/12/22 1800   Does patient have an order for bedrest or is patient medically unstable No - Continue assessment No - Continue assessment No - Continue assessment No - Continue assessment No - Continue assessment   What is the highest level of mobility based on the progressive mobility assessment? Level 5 (Walks with assist in room/hall) - Balance while stepping forward/back and can walk in room with assist - Complete Level 5 (Walks with assist in room/hall) - Balance while stepping forward/back and can walk in room with assist - Complete Level 5 (Walks with assist in room/hall) - Balance while stepping forward/back and can walk  in room with assist - Complete Level 5 (Walks with assist in room/hall) - Balance while stepping forward/back and can walk in room with assist - Complete Level 5 (Walks with assist in room/hall) - Balance while stepping forward/back and can walk in room with assist - Complete            Barriers to discharge: none Disposition Plan:  Home Status is: Inpt  Objective: Blood pressure (!) 145/89, pulse 74, temperature 98 F (36.7 C), temperature source Oral, resp. rate 16, last menstrual period 11/01/2018, SpO2 97%.  Examination:  Physical Exam Constitutional:      General: She is not in acute distress.    Appearance: Normal appearance. She is not ill-appearing.  HENT:     Head: Normocephalic and atraumatic.     Mouth/Throat:     Mouth: Mucous membranes are moist.  Eyes:     Extraocular Movements: Extraocular movements intact.  Cardiovascular:     Rate and Rhythm: Normal rate and regular rhythm.  Pulmonary:     Effort: Pulmonary effort is normal. No respiratory distress.     Breath sounds: Normal breath sounds. No wheezing.  Abdominal:  General: Bowel sounds are decreased. There is distension.     Palpations: Abdomen is soft.     Tenderness: There is abdominal tenderness (nonspecific).  Musculoskeletal:        General: No swelling. Normal range of motion.     Cervical back: Normal range of motion and neck supple.  Skin:    General: Skin is warm and dry.  Neurological:     General: No focal deficit present.     Mental Status: She is alert.  Psychiatric:        Mood and Affect: Mood normal.        Behavior: Behavior normal.      Consultants:  Oncology Palliative care  Procedures:  09/14/22: Paracentesis   Data Reviewed: Results for orders placed or performed during the hospital encounter of 09/12/22 (from the past 24 hour(s))  Protime-INR     Status: Abnormal   Collection Time: 09/14/22  3:50 AM  Result Value Ref Range   Prothrombin Time 16.0 (H) 11.4 - 15.2  seconds   INR 1.3 (H) 0.8 - 1.2  Basic metabolic panel     Status: Abnormal   Collection Time: 09/14/22  3:50 AM  Result Value Ref Range   Sodium 133 (L) 135 - 145 mmol/L   Potassium 3.6 3.5 - 5.1 mmol/L   Chloride 101 98 - 111 mmol/L   CO2 25 22 - 32 mmol/L   Glucose, Bld 132 (H) 70 - 99 mg/dL   BUN 16 6 - 20 mg/dL   Creatinine, Ser 6.96 0.44 - 1.00 mg/dL   Calcium 9.1 8.9 - 29.5 mg/dL   GFR, Estimated >28 >41 mL/min   Anion gap 7 5 - 15  CBC with Differential/Platelet     Status: Abnormal   Collection Time: 09/14/22  3:50 AM  Result Value Ref Range   WBC 3.1 (L) 4.0 - 10.5 K/uL   RBC 2.40 (L) 3.87 - 5.11 MIL/uL   Hemoglobin 7.5 (L) 12.0 - 15.0 g/dL   HCT 32.4 (L) 40.1 - 02.7 %   MCV 96.7 80.0 - 100.0 fL   MCH 31.3 26.0 - 34.0 pg   MCHC 32.3 30.0 - 36.0 g/dL   RDW 25.3 66.4 - 40.3 %   Platelets 106 (L) 150 - 400 K/uL   nRBC 0.0 0.0 - 0.2 %   Neutrophils Relative % 74 %   Neutro Abs 2.3 1.7 - 7.7 K/uL   Lymphocytes Relative 16 %   Lymphs Abs 0.5 (L) 0.7 - 4.0 K/uL   Monocytes Relative 8 %   Monocytes Absolute 0.2 0.1 - 1.0 K/uL   Eosinophils Relative 2 %   Eosinophils Absolute 0.1 0.0 - 0.5 K/uL   Basophils Relative 0 %   Basophils Absolute 0.0 0.0 - 0.1 K/uL   Immature Granulocytes 0 %   Abs Immature Granulocytes 0.01 0.00 - 0.07 K/uL  Magnesium     Status: None   Collection Time: 09/14/22  3:50 AM  Result Value Ref Range   Magnesium 2.0 1.7 - 2.4 mg/dL  Body fluid cell count with differential     Status: Abnormal   Collection Time: 09/14/22 10:32 AM  Result Value Ref Range   Fluid Type-FCT CYTO PERI    Color, Fluid YELLOW (A) YELLOW   Appearance, Fluid HAZY (A) CLEAR   Total Nucleated Cell Count, Fluid 168 0 - 1,000 cu mm   Neutrophil Count, Fluid 8 0 - 25 %   Lymphs, Fluid 48 %   Monocyte-Macrophage-Serous  Fluid 44 (L) 50 - 90 %   Eos, Fluid 0 %    I have reviewed pertinent nursing notes, vitals, labs, and images as necessary. I have ordered labwork to  follow up on as indicated.  I have reviewed the last notes from staff over past 24 hours. I have discussed patient's care plan and test results with nursing staff, CM/SW, and other staff as appropriate.    LOS: 2 days   Lewie Chamber, MD [facility] 09/14/2022, 3:38 PM

## 2022-09-15 DIAGNOSIS — C259 Malignant neoplasm of pancreas, unspecified: Secondary | ICD-10-CM | POA: Diagnosis not present

## 2022-09-15 DIAGNOSIS — R1084 Generalized abdominal pain: Secondary | ICD-10-CM | POA: Diagnosis not present

## 2022-09-15 DIAGNOSIS — C787 Secondary malignant neoplasm of liver and intrahepatic bile duct: Secondary | ICD-10-CM | POA: Diagnosis not present

## 2022-09-15 DIAGNOSIS — R180 Malignant ascites: Secondary | ICD-10-CM | POA: Diagnosis not present

## 2022-09-15 MED ORDER — SODIUM CHLORIDE 0.9 % IV SOLN
12.5000 mg | Freq: Four times a day (QID) | INTRAVENOUS | Status: DC | PRN
  Administered 2022-09-15 – 2022-09-22 (×6): 12.5 mg via INTRAVENOUS
  Filled 2022-09-15: qty 0.5
  Filled 2022-09-15 (×2): qty 12.5
  Filled 2022-09-15: qty 0.5
  Filled 2022-09-15 (×4): qty 12.5

## 2022-09-15 NOTE — Plan of Care (Signed)

## 2022-09-15 NOTE — Progress Notes (Signed)
Progress Note    Tara Jensen   MWU:[id number]  DOB: 01-30-1966  DOA: 09/12/2022     3 PCP: Medicine, [facility]  Initial CC: abdominal pain/distension   Hospital Course: [name] is a 57 yo female with PMH stage IV pancreatic adenocarcinoma, depression/anxiety, chemo induced neuropathy, GERD who presented with abdominal pain, distension, and decreased appetite/nausea.  She is managed closely outpatient by oncology and palliative care for pain control.  Due to worsening symptoms, she was recommended for admission to the hospital for repeat CT abdomen/pelvis and further symptom control.   Interval History:  No events overnight. Feels like fluid has re-accumulated. Wishes to see if radiology can re-check for fluid prior to discharge in case more to draw off. Palliative care note reviewed from 8/3; possible plan for IR to do osteocool if able; could also evaluate ascites at that time too  Plan still remains for overall pursuit of symptoms management and control as best as able while allowing the max QOL possible.   Assessment and Plan: * Abdominal pain - likely multifactorial etiology: pancreatic cancer with prior severe abd pain flares, neuropathy, and now increased distension with nausea/poor intake. Prior CT 6/13 noted with interval increase of mass and new/enlarging hepatic lesions - at risk for GOO as well as further metastatic spread - s/p CT A/P.  (significant progression of disease--see full body of report for extensive summary) - appetite currently is improved and no vomiting, so okay for diet - continue pain and nausea control as needed and per home regimen  - Dr. Phillips Odor also managing with other treatments; follow for response   Ascites - Presumed malignant ascites at this point.  Previously has not been enough fluid for paracentesis but CT is noting fluid collection may be bigger - appreciate eval with IR; she underwent paracentesis on 8/3 with 1 L  removed - cell count reviewed; negative for SBP. Will followup culture (remains negative) and cytology as well  - continue intermittent surveillance in the outpatient setting for repeat paracentesis as needed for relief; depending on timing of re-accumulation, consideration could be given to pleurX as well but drainage above was not as large as expected   Pancreatic cancer metastasized to liver [facility]) - follows with Dr. Truett Perna and Dr. Phillips Odor  - outpatient pain regimen noted; there was mention of pain pump possibly if pain did become worse/uncontrolled at last OV on 7/15 - remains off treatment per oncology; given CT report, still not a good treatment candidate nor would clinical trials likely make a meaningful change; she understands worsening prognosis with current scan and wishes to continue with palliative care and symptom control - discussed with oncology and palliative care  Depression - Continue Cymbalta   Old records reviewed in assessment of this patient  Antimicrobials:   DVT prophylaxis:     Code Status:   Code Status: DNR  Mobility Assessment (Last 72 Hours)     Mobility Assessment     Row Name 09/15/22 0720 09/14/22 2005 09/14/22 0836 09/13/22 2200 09/13/22 1948   Does patient have an order for bedrest or is patient medically unstable No - Continue assessment No - Continue assessment No - Continue assessment No - Continue assessment No - Continue assessment   What is the highest level of mobility based on the progressive mobility assessment? Level 6 (Walks independently in room and hall) - Balance while walking in room without assist - Complete Level 5 (Walks with assist in room/hall) - Balance while  stepping forward/back and can walk in room with assist - Complete Level 6 (Walks independently in room and hall) - Balance while walking in room without assist - Complete Level 5 (Walks with assist in room/hall) - Balance while stepping forward/back and can walk in room with  assist - Complete Level 5 (Walks with assist in room/hall) - Balance while stepping forward/back and can walk in room with assist - Complete    Row Name 09/13/22 0850 09/12/22 2020 09/12/22 1800       Does patient have an order for bedrest or is patient medically unstable No - Continue assessment No - Continue assessment No - Continue assessment     What is the highest level of mobility based on the progressive mobility assessment? Level 5 (Walks with assist in room/hall) - Balance while stepping forward/back and can walk in room with assist - Complete Level 5 (Walks with assist in room/hall) - Balance while stepping forward/back and can walk in room with assist - Complete Level 5 (Walks with assist in room/hall) - Balance while stepping forward/back and can walk in room with assist - Complete             Mobility Assessment (Last 72 Hours)     Mobility Assessment     Row Name 09/15/22 0720 09/14/22 2005 09/14/22 0836 09/13/22 2200 09/13/22 1948   Does patient have an order for bedrest or is patient medically unstable No - Continue assessment No - Continue assessment No - Continue assessment No - Continue assessment No - Continue assessment   What is the highest level of mobility based on the progressive mobility assessment? Level 6 (Walks independently in room and hall) - Balance while walking in room without assist - Complete Level 5 (Walks with assist in room/hall) - Balance while stepping forward/back and can walk in room with assist - Complete Level 6 (Walks independently in room and hall) - Balance while walking in room without assist - Complete Level 5 (Walks with assist in room/hall) - Balance while stepping forward/back and can walk in room with assist - Complete Level 5 (Walks with assist in room/hall) - Balance while stepping forward/back and can walk in room with assist - Complete    Row Name 09/13/22 0850 09/12/22 2020 09/12/22 1800       Does patient have an order for bedrest or  is patient medically unstable No - Continue assessment No - Continue assessment No - Continue assessment     What is the highest level of mobility based on the progressive mobility assessment? Level 5 (Walks with assist in room/hall) - Balance while stepping forward/back and can walk in room with assist - Complete Level 5 (Walks with assist in room/hall) - Balance while stepping forward/back and can walk in room with assist - Complete Level 5 (Walks with assist in room/hall) - Balance while stepping forward/back and can walk in room with assist - Complete             Mobility Assessment (Last 72 Hours)     Mobility Assessment     Row Name 09/15/22 0720 09/14/22 2005 09/14/22 0836 09/13/22 2200 09/13/22 1948   Does patient have an order for bedrest or is patient medically unstable No - Continue assessment No - Continue assessment No - Continue assessment No - Continue assessment No - Continue assessment   What is the highest level of mobility based on the progressive mobility assessment? Level 6 (Walks independently in room and hall) - Balance  while walking in room without assist - Complete Level 5 (Walks with assist in room/hall) - Balance while stepping forward/back and can walk in room with assist - Complete Level 6 (Walks independently in room and hall) - Balance while walking in room without assist - Complete Level 5 (Walks with assist in room/hall) - Balance while stepping forward/back and can walk in room with assist - Complete Level 5 (Walks with assist in room/hall) - Balance while stepping forward/back and can walk in room with assist - Complete    Row Name 09/13/22 0850 09/12/22 2020 09/12/22 1800       Does patient have an order for bedrest or is patient medically unstable No - Continue assessment No - Continue assessment No - Continue assessment     What is the highest level of mobility based on the progressive mobility assessment? Level 5 (Walks with assist in room/hall) - Balance  while stepping forward/back and can walk in room with assist - Complete Level 5 (Walks with assist in room/hall) - Balance while stepping forward/back and can walk in room with assist - Complete Level 5 (Walks with assist in room/hall) - Balance while stepping forward/back and can walk in room with assist - Complete             Mobility Assessment (Last 72 Hours)     Mobility Assessment     Row Name 09/15/22 0720 09/14/22 2005 09/14/22 0836 09/13/22 2200 09/13/22 1948   Does patient have an order for bedrest or is patient medically unstable No - Continue assessment No - Continue assessment No - Continue assessment No - Continue assessment No - Continue assessment   What is the highest level of mobility based on the progressive mobility assessment? Level 6 (Walks independently in room and hall) - Balance while walking in room without assist - Complete Level 5 (Walks with assist in room/hall) - Balance while stepping forward/back and can walk in room with assist - Complete Level 6 (Walks independently in room and hall) - Balance while walking in room without assist - Complete Level 5 (Walks with assist in room/hall) - Balance while stepping forward/back and can walk in room with assist - Complete Level 5 (Walks with assist in room/hall) - Balance while stepping forward/back and can walk in room with assist - Complete    Row Name 09/13/22 0850 09/12/22 2020 09/12/22 1800       Does patient have an order for bedrest or is patient medically unstable No - Continue assessment No - Continue assessment No - Continue assessment     What is the highest level of mobility based on the progressive mobility assessment? Level 5 (Walks with assist in room/hall) - Balance while stepping forward/back and can walk in room with assist - Complete Level 5 (Walks with assist in room/hall) - Balance while stepping forward/back and can walk in room with assist - Complete Level 5 (Walks with assist in room/hall) - Balance  while stepping forward/back and can walk in room with assist - Complete              Barriers to discharge: none Disposition Plan:  Home Status is: Inpt  Objective: Blood pressure 120/69, pulse 78, temperature 99 F (37.2 C), temperature source Oral, resp. rate 18, last menstrual period 11/01/2018, SpO2 99%.  Examination:  Physical Exam Constitutional:      General: She is not in acute distress.    Appearance: Normal appearance. She is not ill-appearing.  HENT:  Head: Normocephalic and atraumatic.     Mouth/Throat:     Mouth: Mucous membranes are moist.  Eyes:     Extraocular Movements: Extraocular movements intact.  Cardiovascular:     Rate and Rhythm: Normal rate and regular rhythm.  Pulmonary:     Effort: Pulmonary effort is normal. No respiratory distress.     Breath sounds: Normal breath sounds. No wheezing.  Abdominal:     General: Bowel sounds are decreased. There is distension.     Palpations: Abdomen is soft.     Tenderness: There is abdominal tenderness (nonspecific).  Musculoskeletal:        General: No swelling. Normal range of motion.     Cervical back: Normal range of motion and neck supple.  Skin:    General: Skin is warm and dry.  Neurological:     General: No focal deficit present.     Mental Status: She is alert.  Psychiatric:        Mood and Affect: Mood normal.        Behavior: Behavior normal.      Consultants:  Oncology Palliative care  Procedures:  09/14/22: Paracentesis   Data Reviewed: No results found for this or any previous visit (from the past 24 hour(s)).   I have reviewed pertinent nursing notes, vitals, labs, and images as necessary. I have ordered labwork to follow up on as indicated.  I have reviewed the last notes from staff over past 24 hours. I have discussed patient's care plan and test results with nursing staff, CM/SW, and other staff as appropriate.    LOS: 3 days   Lewie Chamber, MD [facility] 09/15/2022, 2:57 PM

## 2022-09-15 NOTE — Progress Notes (Signed)
Palliative Care Progress Note  56 yp w/pancreatic cancer-disease progression causing worsening symptom burden. S/P paracentesis this AM, thinks fluid is"coming back"-worsening pain in lower back. +reflux, nausea.  Met with Tara Jensen and her family and spoke to her brother by phone. Goals remain maximizing QOL in the setting of terminal illness- tumor progression seems to be aggressive over past 2 weeks.  Aggressive Symptom Management Increased Decadron Scheduled Reglan Started a PCA -Hydromorphone-will determine needs and increase her fentanyl patch IV toradol  Trial of Octreotide- prior auth started for injectable LAR-attempt to reduce gastric secretion-concern for impending outlet obstrucion. Much of her pain is at the level of L2-will ask IR on Monday if she is a candidate for Osteocool-she does not want to do any radiation.  Disposition goal is home with hospice-if her condition deteriorates will discuss hospice facility.  Tara Malta, DO Palliative Medicine

## 2022-09-16 ENCOUNTER — Inpatient Hospital Stay (HOSPITAL_COMMUNITY): Payer: BC Managed Care – PPO

## 2022-09-16 DIAGNOSIS — R180 Malignant ascites: Secondary | ICD-10-CM | POA: Diagnosis not present

## 2022-09-16 DIAGNOSIS — R1084 Generalized abdominal pain: Secondary | ICD-10-CM | POA: Diagnosis not present

## 2022-09-16 DIAGNOSIS — C259 Malignant neoplasm of pancreas, unspecified: Secondary | ICD-10-CM | POA: Diagnosis not present

## 2022-09-16 DIAGNOSIS — C787 Secondary malignant neoplasm of liver and intrahepatic bile duct: Secondary | ICD-10-CM | POA: Diagnosis not present

## 2022-09-16 MED ORDER — METHYLNALTREXONE BROMIDE 12 MG/0.6ML ~~LOC~~ SOLN: 12.0000 mg | SUBCUTANEOUS | Status: DC

## 2022-09-16 MED ORDER — FENTANYL 75 MCG/HR TD PT72
2.0000 | MEDICATED_PATCH | TRANSDERMAL | Status: DC
  Administered 2022-09-16 – 2022-09-19 (×2): 2 via TRANSDERMAL
  Filled 2022-09-16 (×2): qty 2
  Filled 2022-09-16 (×2): qty 1

## 2022-09-16 MED ORDER — ACETAMINOPHEN 10 MG/ML IV SOLN: 1000.0000 mg | Freq: Four times a day (QID) | INTRAVENOUS | Status: AC | PRN

## 2022-09-16 MED ORDER — SODIUM CHLORIDE 0.9 % IV SOLN: 8.0000 mg | Freq: Every day | INTRAVENOUS | Status: DC

## 2022-09-16 MED ORDER — DEXAMETHASONE SODIUM PHOSPHATE 10 MG/ML IJ SOLN
8.0000 mg | Freq: Every day | INTRAMUSCULAR | Status: DC
  Administered 2022-09-16 – 2022-09-17 (×2): 8 mg via INTRAVENOUS
  Filled 2022-09-16 (×2): qty 1

## 2022-09-16 MED ORDER — LIDOCAINE HCL 1 % IJ SOLN
INTRAMUSCULAR | Status: AC
  Filled 2022-09-16: qty 20

## 2022-09-16 MED ORDER — SIMETHICONE 80 MG PO CHEW: 80.0000 mg | CHEWABLE_TABLET | Freq: Four times a day (QID) | ORAL | Status: DC | PRN

## 2022-09-16 MED ORDER — FENTANYL 75 MCG/HR TD PT72: 1.0000 | MEDICATED_PATCH | TRANSDERMAL | Status: DC

## 2022-09-16 MED ORDER — METHYLNALTREXONE BROMIDE 12 MG/0.6ML ~~LOC~~ SOLN
8.0000 mg | Freq: Once | SUBCUTANEOUS | Status: AC
  Administered 2022-09-16: 8 mg via SUBCUTANEOUS
  Filled 2022-09-16: qty 0.6

## 2022-09-16 MED ORDER — HYDROMORPHONE 1 MG/ML IV SOLN
INTRAVENOUS | Status: DC
  Administered 2022-09-17: 5.83 mg via INTRAVENOUS
  Administered 2022-09-17: 3.76 mg via INTRAVENOUS
  Administered 2022-09-17: 30 mg via INTRAVENOUS
  Filled 2022-09-16: qty 30

## 2022-09-16 MED ORDER — SCOPOLAMINE 1 MG/3DAYS TD PT72
1.0000 | MEDICATED_PATCH | TRANSDERMAL | Status: DC
  Administered 2022-09-16 – 2022-09-22 (×3): 1.5 mg via TRANSDERMAL
  Filled 2022-09-16 (×3): qty 1

## 2022-09-16 MED ORDER — SIMETHICONE 80 MG PO CHEW
160.0000 mg | CHEWABLE_TABLET | Freq: Four times a day (QID) | ORAL | Status: DC | PRN
  Administered 2022-09-16 – 2022-09-17 (×2): 160 mg via ORAL
  Filled 2022-09-16 (×2): qty 2

## 2022-09-16 NOTE — Plan of Care (Signed)

## 2022-09-16 NOTE — Progress Notes (Signed)
Progress Note    Tara Jensen   ZOX:[id number]  DOB: Oct 31, 1965  DOA: 09/12/2022     4 PCP: Medicine, [facility]  Initial CC: abdominal pain/distension   Hospital Course: Tara Jensen is a 57 yo female with PMH stage IV pancreatic adenocarcinoma, depression/anxiety, chemo induced neuropathy, GERD who presented with abdominal pain, distension, and decreased appetite/nausea.  She is managed closely outpatient by oncology and palliative care for pain control.  Due to worsening symptoms, she was recommended for admission to the hospital for repeat CT abdomen/pelvis and further symptom control.   Interval History:  Becoming more lethargic now but pain seems controlled for the most part, with some PCA use as well. Abdomen more distended today and less bowel sounds. Will see if any fluid to attempt repeat paracentesis but if not, concerned for worsening underlying mass-effect.   Assessment and Plan: * Abdominal pain - likely multifactorial etiology: pancreatic cancer with prior severe abd pain flares, neuropathy, and now increased distension with nausea/poor intake. Prior CT 6/13 noted with interval increase of mass and new/enlarging hepatic lesions - at risk for GOO as well as further metastatic spread - s/p CT A/P.  (significant progression of disease--see full body of report for extensive summary) - appetite labile now; okay to eat as she pleases; intake now decreasing and bowel sounds becoming decreased; concerned for imminent decline - continue nausea and pain control; deferring management to Dr. Phillips Odor  Ascites - Presumed malignant ascites at this point.  Previously has not been enough fluid for paracentesis but CT is noting fluid collection may be bigger - appreciate eval with IR; she underwent paracentesis on 8/3 with 1 L removed - cell count reviewed; negative for SBP. Will followup culture (remains negative) and cytology as well  - patient requesting repeat  eval for para; not sure if enough fluid again yet but will ask IR to check. If no fluid, the distension is likely tumor burden and evolution of underlying processes at play   Pancreatic cancer metastasized to liver [facility]) - follows with Dr. Truett Perna and Dr. Phillips Odor  - remains off treatment per oncology; given CT report, still not a good treatment candidate nor would clinical trials likely make a meaningful change; she understands worsening prognosis with current scan and wishes to continue with palliative care and symptom control - discussed with oncology and palliative care - initial patient wish was for home with hospice but if continues to decline she may warrant residential hospice vs remaining inpatient   Depression - Continue Cymbalta   Old records reviewed in assessment of this patient  Antimicrobials:   DVT prophylaxis:     Code Status:   Code Status: DNR  Mobility Assessment (Last 72 Hours)     Mobility Assessment     Row Name 09/15/22 2000 09/15/22 0720 09/14/22 2005 09/14/22 0836 09/13/22 2200   Does patient have an order for bedrest or is patient medically unstable No - Continue assessment No - Continue assessment No - Continue assessment No - Continue assessment No - Continue assessment   What is the highest level of mobility based on the progressive mobility assessment? Level 5 (Walks with assist in room/hall) - Balance while stepping forward/back and can walk in room with assist - Complete Level 6 (Walks independently in room and hall) - Balance while walking in room without assist - Complete Level 5 (Walks with assist in room/hall) - Balance while stepping forward/back and can walk in room with assist - Complete  Level 6 (Walks independently in room and hall) - Balance while walking in room without assist - Complete Level 5 (Walks with assist in room/hall) - Balance while stepping forward/back and can walk in room with assist - Complete    Row Name 09/13/22 1948            Does patient have an order for bedrest or is patient medically unstable No - Continue assessment       What is the highest level of mobility based on the progressive mobility assessment? Level 5 (Walks with assist in room/hall) - Balance while stepping forward/back and can walk in room with assist - Complete               Mobility Assessment (Last 72 Hours)     Mobility Assessment     Row Name 09/15/22 2000 09/15/22 0720 09/14/22 2005 09/14/22 0836 09/13/22 2200   Does patient have an order for bedrest or is patient medically unstable No - Continue assessment No - Continue assessment No - Continue assessment No - Continue assessment No - Continue assessment   What is the highest level of mobility based on the progressive mobility assessment? Level 5 (Walks with assist in room/hall) - Balance while stepping forward/back and can walk in room with assist - Complete Level 6 (Walks independently in room and hall) - Balance while walking in room without assist - Complete Level 5 (Walks with assist in room/hall) - Balance while stepping forward/back and can walk in room with assist - Complete Level 6 (Walks independently in room and hall) - Balance while walking in room without assist - Complete Level 5 (Walks with assist in room/hall) - Balance while stepping forward/back and can walk in room with assist - Complete    Row Name 09/13/22 1948           Does patient have an order for bedrest or is patient medically unstable No - Continue assessment       What is the highest level of mobility based on the progressive mobility assessment? Level 5 (Walks with assist in room/hall) - Balance while stepping forward/back and can walk in room with assist - Complete               Mobility Assessment (Last 72 Hours)     Mobility Assessment     Row Name 09/15/22 2000 09/15/22 0720 09/14/22 2005 09/14/22 0836 09/13/22 2200   Does patient have an order for bedrest or is patient medically unstable  No - Continue assessment No - Continue assessment No - Continue assessment No - Continue assessment No - Continue assessment   What is the highest level of mobility based on the progressive mobility assessment? Level 5 (Walks with assist in room/hall) - Balance while stepping forward/back and can walk in room with assist - Complete Level 6 (Walks independently in room and hall) - Balance while walking in room without assist - Complete Level 5 (Walks with assist in room/hall) - Balance while stepping forward/back and can walk in room with assist - Complete Level 6 (Walks independently in room and hall) - Balance while walking in room without assist - Complete Level 5 (Walks with assist in room/hall) - Balance while stepping forward/back and can walk in room with assist - Complete    Row Name 09/13/22 1948           Does patient have an order for bedrest or is patient medically unstable No - Continue assessment  What is the highest level of mobility based on the progressive mobility assessment? Level 5 (Walks with assist in room/hall) - Balance while stepping forward/back and can walk in room with assist - Complete               Mobility Assessment (Last 72 Hours)     Mobility Assessment     Row Name 09/15/22 2000 09/15/22 0720 09/14/22 2005 09/14/22 0836 09/13/22 2200   Does patient have an order for bedrest or is patient medically unstable No - Continue assessment No - Continue assessment No - Continue assessment No - Continue assessment No - Continue assessment   What is the highest level of mobility based on the progressive mobility assessment? Level 5 (Walks with assist in room/hall) - Balance while stepping forward/back and can walk in room with assist - Complete Level 6 (Walks independently in room and hall) - Balance while walking in room without assist - Complete Level 5 (Walks with assist in room/hall) - Balance while stepping forward/back and can walk in room with assist -  Complete Level 6 (Walks independently in room and hall) - Balance while walking in room without assist - Complete Level 5 (Walks with assist in room/hall) - Balance while stepping forward/back and can walk in room with assist - Complete    Row Name 09/13/22 1948           Does patient have an order for bedrest or is patient medically unstable No - Continue assessment       What is the highest level of mobility based on the progressive mobility assessment? Level 5 (Walks with assist in room/hall) - Balance while stepping forward/back and can walk in room with assist - Complete               Mobility Assessment (Last 72 Hours)     Mobility Assessment     Row Name 09/15/22 2000 09/15/22 0720 09/14/22 2005 09/14/22 0836 09/13/22 2200   Does patient have an order for bedrest or is patient medically unstable No - Continue assessment No - Continue assessment No - Continue assessment No - Continue assessment No - Continue assessment   What is the highest level of mobility based on the progressive mobility assessment? Level 5 (Walks with assist in room/hall) - Balance while stepping forward/back and can walk in room with assist - Complete Level 6 (Walks independently in room and hall) - Balance while walking in room without assist - Complete Level 5 (Walks with assist in room/hall) - Balance while stepping forward/back and can walk in room with assist - Complete Level 6 (Walks independently in room and hall) - Balance while walking in room without assist - Complete Level 5 (Walks with assist in room/hall) - Balance while stepping forward/back and can walk in room with assist - Complete    Row Name 09/13/22 1948           Does patient have an order for bedrest or is patient medically unstable No - Continue assessment       What is the highest level of mobility based on the progressive mobility assessment? Level 5 (Walks with assist in room/hall) - Balance while stepping forward/back and can walk  in room with assist - Complete               Mobility Assessment (Last 72 Hours)     Mobility Assessment     Row Name 09/15/22 2000 09/15/22 0720 09/14/22 2005 09/14/22 0836 09/13/22  2200   Does patient have an order for bedrest or is patient medically unstable No - Continue assessment No - Continue assessment No - Continue assessment No - Continue assessment No - Continue assessment   What is the highest level of mobility based on the progressive mobility assessment? Level 5 (Walks with assist in room/hall) - Balance while stepping forward/back and can walk in room with assist - Complete Level 6 (Walks independently in room and hall) - Balance while walking in room without assist - Complete Level 5 (Walks with assist in room/hall) - Balance while stepping forward/back and can walk in room with assist - Complete Level 6 (Walks independently in room and hall) - Balance while walking in room without assist - Complete Level 5 (Walks with assist in room/hall) - Balance while stepping forward/back and can walk in room with assist - Complete    Row Name 09/13/22 1948           Does patient have an order for bedrest or is patient medically unstable No - Continue assessment       What is the highest level of mobility based on the progressive mobility assessment? Level 5 (Walks with assist in room/hall) - Balance while stepping forward/back and can walk in room with assist - Complete               Mobility Assessment (Last 72 Hours)     Mobility Assessment     Row Name 09/15/22 2000 09/15/22 0720 09/14/22 2005 09/14/22 0836 09/13/22 2200   Does patient have an order for bedrest or is patient medically unstable No - Continue assessment No - Continue assessment No - Continue assessment No - Continue assessment No - Continue assessment   What is the highest level of mobility based on the progressive mobility assessment? Level 5 (Walks with assist in room/hall) - Balance while stepping  forward/back and can walk in room with assist - Complete Level 6 (Walks independently in room and hall) - Balance while walking in room without assist - Complete Level 5 (Walks with assist in room/hall) - Balance while stepping forward/back and can walk in room with assist - Complete Level 6 (Walks independently in room and hall) - Balance while walking in room without assist - Complete Level 5 (Walks with assist in room/hall) - Balance while stepping forward/back and can walk in room with assist - Complete    Row Name 09/13/22 1948           Does patient have an order for bedrest or is patient medically unstable No - Continue assessment       What is the highest level of mobility based on the progressive mobility assessment? Level 5 (Walks with assist in room/hall) - Balance while stepping forward/back and can walk in room with assist - Complete               Mobility Assessment (Last 72 Hours)     Mobility Assessment     Row Name 09/15/22 2000 09/15/22 0720 09/14/22 2005 09/14/22 0836 09/13/22 2200   Does patient have an order for bedrest or is patient medically unstable No - Continue assessment No - Continue assessment No - Continue assessment No - Continue assessment No - Continue assessment   What is the highest level of mobility based on the progressive mobility assessment? Level 5 (Walks with assist in room/hall) - Balance while stepping forward/back and can walk in room with assist - Complete Level 6 (Walks independently  in room and hall) - Balance while walking in room without assist - Complete Level 5 (Walks with assist in room/hall) - Balance while stepping forward/back and can walk in room with assist - Complete Level 6 (Walks independently in room and hall) - Balance while walking in room without assist - Complete Level 5 (Walks with assist in room/hall) - Balance while stepping forward/back and can walk in room with assist - Complete    Row Name 09/13/22 1948           Does  patient have an order for bedrest or is patient medically unstable No - Continue assessment       What is the highest level of mobility based on the progressive mobility assessment? Level 5 (Walks with assist in room/hall) - Balance while stepping forward/back and can walk in room with assist - Complete                Barriers to discharge: none Disposition Plan:  Home Status is: Inpt  Objective: Blood pressure 128/66, pulse 82, temperature 99.3 F (37.4 C), temperature source Oral, resp. rate 18, last menstrual period 11/01/2018, SpO2 98%.  Examination:  Physical Exam Constitutional:      General: She is not in acute distress.    Appearance: Normal appearance. She is not ill-appearing.  HENT:     Head: Normocephalic and atraumatic.     Mouth/Throat:     Mouth: Mucous membranes are moist.  Eyes:     Extraocular Movements: Extraocular movements intact.  Cardiovascular:     Rate and Rhythm: Normal rate and regular rhythm.  Pulmonary:     Effort: Pulmonary effort is normal. No respiratory distress.     Breath sounds: Normal breath sounds. No wheezing.  Abdominal:     General: Bowel sounds are decreased. There is distension (worse today).     Palpations: Abdomen is soft.     Tenderness: There is abdominal tenderness (nonspecific).     Comments: BS more decreased today  Musculoskeletal:        General: No swelling. Normal range of motion.     Cervical back: Normal range of motion and neck supple.  Skin:    General: Skin is warm and dry.  Neurological:     General: No focal deficit present.     Mental Status: She is alert.  Psychiatric:        Mood and Affect: Mood normal.        Behavior: Behavior normal.      Consultants:  Oncology Palliative care  Procedures:  09/14/22: Paracentesis   Data Reviewed: No results found for this or any previous visit (from the past 24 hour(s)).   I have reviewed pertinent nursing notes, vitals, labs, and images as necessary. I  have ordered labwork to follow up on as indicated.  I have reviewed the last notes from staff over past 24 hours. I have discussed patient's care plan and test results with nursing staff, CM/SW, and other staff as appropriate.    LOS: 4 days   Lewie Chamber, MD [facility] 09/16/2022, 1:57 PM

## 2022-09-17 ENCOUNTER — Inpatient Hospital Stay (HOSPITAL_COMMUNITY): Payer: BC Managed Care – PPO

## 2022-09-17 DIAGNOSIS — C787 Secondary malignant neoplasm of liver and intrahepatic bile duct: Secondary | ICD-10-CM | POA: Diagnosis not present

## 2022-09-17 DIAGNOSIS — R1084 Generalized abdominal pain: Secondary | ICD-10-CM | POA: Diagnosis not present

## 2022-09-17 DIAGNOSIS — C259 Malignant neoplasm of pancreas, unspecified: Secondary | ICD-10-CM | POA: Diagnosis not present

## 2022-09-17 DIAGNOSIS — R180 Malignant ascites: Secondary | ICD-10-CM | POA: Diagnosis not present

## 2022-09-17 LAB — CYTOLOGY - NON PAP

## 2022-09-17 MED ORDER — HYOSCYAMINE SULFATE 0.5 MG/ML IJ SOLN
0.5000 mg | INTRAMUSCULAR | Status: AC
  Administered 2022-09-17: 0.5 mg via INTRAVENOUS
  Filled 2022-09-17: qty 1

## 2022-09-17 MED ORDER — METOCLOPRAMIDE HCL 5 MG/ML IJ SOLN
10.0000 mg | Freq: Four times a day (QID) | INTRAMUSCULAR | Status: DC
  Administered 2022-09-17 – 2022-09-18 (×2): 10 mg via INTRAVENOUS
  Filled 2022-09-17 (×2): qty 2

## 2022-09-17 MED ORDER — METHYLNALTREXONE BROMIDE 12 MG/0.6ML ~~LOC~~ SOLN
8.0000 mg | SUBCUTANEOUS | Status: DC
  Administered 2022-09-17 – 2022-09-21 (×5): 8 mg via SUBCUTANEOUS
  Filled 2022-09-17 (×6): qty 0.6

## 2022-09-17 MED ORDER — DEXAMETHASONE SODIUM PHOSPHATE 4 MG/ML IJ SOLN: 4.0000 mg | Freq: Every day | INTRAMUSCULAR | Status: DC

## 2022-09-17 MED ORDER — DICYCLOMINE HCL 10 MG/5ML PO SOLN
10.0000 mg | Freq: Once | ORAL | Status: AC
  Administered 2022-09-17: 10 mg via ORAL
  Filled 2022-09-17 (×2): qty 5

## 2022-09-17 MED ORDER — MAGNESIUM SULFATE 2 GM/50ML IV SOLN
2.0000 g | Freq: Once | INTRAVENOUS | Status: AC
  Administered 2022-09-17: 2 g via INTRAVENOUS
  Filled 2022-09-17: qty 50

## 2022-09-17 MED ORDER — HYDROMORPHONE 1 MG/ML IV SOLN
INTRAVENOUS | Status: DC
  Administered 2022-09-17: 1.99 mg via INTRAVENOUS
  Administered 2022-09-17: 3.84 mg via INTRAVENOUS
  Administered 2022-09-18: 3 mg via INTRAVENOUS

## 2022-09-17 MED ORDER — DIAZEPAM 5 MG/ML IJ SOLN
2.5000 mg | Freq: Two times a day (BID) | INTRAMUSCULAR | Status: DC | PRN
  Administered 2022-09-18: 2.5 mg via INTRAVENOUS
  Filled 2022-09-17: qty 2

## 2022-09-17 MED ORDER — ALUM & MAG HYDROXIDE-SIMETH 200-200-20 MG/5ML PO SUSP
30.0000 mL | Freq: Once | ORAL | Status: AC
  Administered 2022-09-17: 30 mL via ORAL
  Filled 2022-09-17: qty 30

## 2022-09-17 MED ORDER — OLANZAPINE 5 MG PO TBDP
5.0000 mg | ORAL_TABLET | Freq: Every day | ORAL | Status: DC
  Administered 2022-09-17 – 2022-09-21 (×4): 5 mg via ORAL
  Filled 2022-09-17 (×5): qty 1

## 2022-09-17 MED ORDER — ALBUMIN HUMAN 25 % IV SOLN
12.5000 g | Freq: Two times a day (BID) | INTRAVENOUS | Status: DC
  Administered 2022-09-17: 12.5 g via INTRAVENOUS
  Filled 2022-09-17: qty 50

## 2022-09-17 MED ORDER — OLANZAPINE 5 MG PO TBDP
5.0000 mg | ORAL_TABLET | Freq: Four times a day (QID) | ORAL | Status: DC | PRN
  Administered 2022-09-21: 5 mg via ORAL
  Filled 2022-09-17 (×2): qty 1

## 2022-09-17 NOTE — Progress Notes (Signed)
Palliative Care Progress Note  57 year old with advanced pancreatic cance admitted for complex symptom management needs in setting of disease progression.  She is now status post paracentesis with 1 L of fluid removed 2 days ago, repeat attempt yesterday was done that did not yield ascites fluid.  Case was discussed with interventional radiologist and her abdominal distention is likely secondary to tumor as well as a complex fluid collection near the liver.  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  Symptomatically she has increasing nausea which was not present before this admission has required daily titration pain medication.  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  Today Theadora has received this news about her cancer progression, but is coping well as could be expected.  He has loving friends and family at bedside who are supporting her but she does endorse feeling overwhelmed. She continues to be alert and oriented and communicating appropriately about her goals of care and choices.  She is requested that we continue to treat her aggressively both her symptoms and to maintain noninvasive medical interventions while she finishes some last-minute details evolving her personal and financial affairs. She wants to finish writing her obituary and other details before EOL.  Shakeria knows that her condition could change fast and that her prognosis may be short. She may not be able to return home and may need a hospice facility for her care- will determine need once we get better control of her symptoms. She has accepted that this is the point she is at in her disease.  Cancer related Pain, Nausea: Increased Duragesic to TD Stopped oral hydromorphone and started a basal through  PCA-will try to cut this back so PCA is for her breakthrough dosing-she will likely need to discharge with CADD pump PCA. Will titrate up the sandostatin today. Continue albumin for now. Continue T-Scop patch, scheduled Reglan, prn compazine  Stopped most of her PO meds  Changed relistor back to daily.  Will continue to follow closely.Continue inpatient level of care for complex symptom management needs,  Anderson Malta, DO Palliative Medicine

## 2022-09-17 NOTE — Progress Notes (Signed)
Palliative Care Progress Note  Worsening reflux and hiccups today as well as nausea-no BM yesterday or response to Relistor. Her pain is much better and she has not used PCA bolus in past 12 hours. More drowsy this AM, but is still oriented and engages easily. Her discomfort is from the hiccups-they kept her up last PM. Her family is at bedside, including her brother and elderly parents. Grief is appropriate.  Recommendations:  Titrate Octreotide to 100 mcg -may go up to 300 mcg- attempting to suppress acid secretion and reduce vomiting due to outlet obstruction. Reduce Albumin to q12 Start Levsin 0.5 mg IV for hiccups, along with a trial of GI cocktail for reflux to coat stomach and esophagus. Reduce basal to 0.5mg  hour on PCA Valium 2.5mg  prn for hiccups and anxiety and well as Magnesium Sulfate 2g IV. Maintain all other interventions for now.   Tara Jensen hopes to be able to finalize her affairs today/ Once this is done we can discuss what IV intervention we should continue or stop.  Anderson Malta, DO Palliative Medicine   Time: 50 min

## 2022-09-17 NOTE — Progress Notes (Signed)
Progress Note    [name]   BJY:[id number]  DOB: 11-10-1965  DOA: 09/12/2022     5 PCP: Medicine, [facility]  Initial CC: abdominal pain/distension   Hospital Course: Tara Jensen is a 57 yo female with PMH stage IV pancreatic adenocarcinoma, depression/anxiety, chemo induced neuropathy, GERD who presented with abdominal pain, distension, and decreased appetite/nausea.  Tara Jensen is managed closely outpatient by oncology and palliative care for pain control.  Due to worsening symptoms, Tara Jensen was recommended for admission to the hospital for repeat CT abdomen/pelvis and further symptom control.   Interval History:  More abdominal distension today and vomited about twice yesterday. Sedated but easily awakens and can clearly carry on conversation well this morning. Abdomen also more firm.  Despite max efforts, things are beginning to steadily decline with her prognosis. Tentative plan seems to be either in-hospital comfort care vs transition to residential hospice. Discussions to be led by Dr. Phillips Odor and patient/family.   Assessment and Plan: * Abdominal pain - likely multifactorial etiology: pancreatic cancer with prior severe abd pain flares, neuropathy, and now increased distension with nausea/poor intake. Prior CT 6/13 noted with interval increase of mass and new/enlarging hepatic lesions - at risk for GOO as well as further metastatic spread - s/p CT A/P.  (significant progression of disease--see full body of report for extensive summary) - appetite labile now; okay to eat as Tara Jensen pleases; intake now decreasing and bowel sounds becoming decreased; concerned for imminent decline - continue nausea and pain control; deferring management to Dr. Phillips Odor - abdomen showing worsening distension and firmness today, 8/6; obtained abd xray and minimal gas pattern likely b/c of worsening underlying obstruction; at this time best option is for ongoing symptom management and making her  as comfortable as possible which is what is being done. Greatly appreciate palliative care assistance   Ascites - Presumed malignant ascites at this point.  Previously has not been enough fluid for paracentesis but CT is noting fluid collection may be bigger - appreciate eval with IR; Tara Jensen underwent paracentesis on 8/3 with 1 L removed - cell count reviewed; negative for SBP. Will followup culture (remains negative) and cytology as well  - repeat para eval performed on 8/5 (not enough fluid to tap)  Pancreatic cancer metastasized to liver [facility]) - follows with Dr. Truett Perna and Dr. Phillips Odor  - remains off treatment per oncology; given CT report, still not a good treatment candidate nor would clinical trials likely make a meaningful change; Tara Jensen understands worsening prognosis with current scan and wishes to continue with palliative care and symptom control - discussed with oncology and palliative care - initial patient wish was for home with hospice but if continues to decline Tara Jensen may warrant residential hospice vs remaining inpatient   Depression - Continue Cymbalta   Old records reviewed in assessment of this patient  Antimicrobials:   DVT prophylaxis:    Code Status:   Code Status: DNR  Mobility Assessment (Last 72 Hours)     Mobility Assessment     Row Name 09/17/22 1017 09/16/22 2115 09/16/22 0752 09/15/22 2000 09/15/22 0720   Does patient have an order for bedrest or is patient medically unstable No - Continue assessment No - Continue assessment No - Continue assessment No - Continue assessment No - Continue assessment   What is the highest level of mobility based on the progressive mobility assessment? Level 5 (Walks with assist in room/hall) - Balance while stepping forward/back and can walk in  room with assist - Complete Level 5 (Walks with assist in room/hall) - Balance while stepping forward/back and can walk in room with assist - Complete Level 5 (Walks with assist in  room/hall) - Balance while stepping forward/back and can walk in room with assist - Complete Level 5 (Walks with assist in room/hall) - Balance while stepping forward/back and can walk in room with assist - Complete Level 6 (Walks independently in room and hall) - Balance while walking in room without assist - Complete    Row Name 09/14/22 2005           Does patient have an order for bedrest or is patient medically unstable No - Continue assessment       What is the highest level of mobility based on the progressive mobility assessment? Level 5 (Walks with assist in room/hall) - Balance while stepping forward/back and can walk in room with assist - Complete               Mobility Assessment (Last 72 Hours)     Mobility Assessment     Row Name 09/17/22 1017 09/16/22 2115 09/16/22 0752 09/15/22 2000 09/15/22 0720   Does patient have an order for bedrest or is patient medically unstable No - Continue assessment No - Continue assessment No - Continue assessment No - Continue assessment No - Continue assessment   What is the highest level of mobility based on the progressive mobility assessment? Level 5 (Walks with assist in room/hall) - Balance while stepping forward/back and can walk in room with assist - Complete Level 5 (Walks with assist in room/hall) - Balance while stepping forward/back and can walk in room with assist - Complete Level 5 (Walks with assist in room/hall) - Balance while stepping forward/back and can walk in room with assist - Complete Level 5 (Walks with assist in room/hall) - Balance while stepping forward/back and can walk in room with assist - Complete Level 6 (Walks independently in room and hall) - Balance while walking in room without assist - Complete    Row Name 09/14/22 2005           Does patient have an order for bedrest or is patient medically unstable No - Continue assessment       What is the highest level of mobility based on the progressive mobility  assessment? Level 5 (Walks with assist in room/hall) - Balance while stepping forward/back and can walk in room with assist - Complete               Mobility Assessment (Last 72 Hours)     Mobility Assessment     Row Name 09/17/22 1017 09/16/22 2115 09/16/22 0752 09/15/22 2000 09/15/22 0720   Does patient have an order for bedrest or is patient medically unstable No - Continue assessment No - Continue assessment No - Continue assessment No - Continue assessment No - Continue assessment   What is the highest level of mobility based on the progressive mobility assessment? Level 5 (Walks with assist in room/hall) - Balance while stepping forward/back and can walk in room with assist - Complete Level 5 (Walks with assist in room/hall) - Balance while stepping forward/back and can walk in room with assist - Complete Level 5 (Walks with assist in room/hall) - Balance while stepping forward/back and can walk in room with assist - Complete Level 5 (Walks with assist in room/hall) - Balance while stepping forward/back and can walk in room with assist - Complete  Level 6 (Walks independently in room and hall) - Balance while walking in room without assist - Complete    Row Name 09/14/22 2005           Does patient have an order for bedrest or is patient medically unstable No - Continue assessment       What is the highest level of mobility based on the progressive mobility assessment? Level 5 (Walks with assist in room/hall) - Balance while stepping forward/back and can walk in room with assist - Complete               Mobility Assessment (Last 72 Hours)     Mobility Assessment     Row Name 09/17/22 1017 09/16/22 2115 09/16/22 0752 09/15/22 2000 09/15/22 0720   Does patient have an order for bedrest or is patient medically unstable No - Continue assessment No - Continue assessment No - Continue assessment No - Continue assessment No - Continue assessment   What is the highest level of mobility  based on the progressive mobility assessment? Level 5 (Walks with assist in room/hall) - Balance while stepping forward/back and can walk in room with assist - Complete Level 5 (Walks with assist in room/hall) - Balance while stepping forward/back and can walk in room with assist - Complete Level 5 (Walks with assist in room/hall) - Balance while stepping forward/back and can walk in room with assist - Complete Level 5 (Walks with assist in room/hall) - Balance while stepping forward/back and can walk in room with assist - Complete Level 6 (Walks independently in room and hall) - Balance while walking in room without assist - Complete    Row Name 09/14/22 2005           Does patient have an order for bedrest or is patient medically unstable No - Continue assessment       What is the highest level of mobility based on the progressive mobility assessment? Level 5 (Walks with assist in room/hall) - Balance while stepping forward/back and can walk in room with assist - Complete               Mobility Assessment (Last 72 Hours)     Mobility Assessment     Row Name 09/17/22 1017 09/16/22 2115 09/16/22 0752 09/15/22 2000 09/15/22 0720   Does patient have an order for bedrest or is patient medically unstable No - Continue assessment No - Continue assessment No - Continue assessment No - Continue assessment No - Continue assessment   What is the highest level of mobility based on the progressive mobility assessment? Level 5 (Walks with assist in room/hall) - Balance while stepping forward/back and can walk in room with assist - Complete Level 5 (Walks with assist in room/hall) - Balance while stepping forward/back and can walk in room with assist - Complete Level 5 (Walks with assist in room/hall) - Balance while stepping forward/back and can walk in room with assist - Complete Level 5 (Walks with assist in room/hall) - Balance while stepping forward/back and can walk in room with assist - Complete Level  6 (Walks independently in room and hall) - Balance while walking in room without assist - Complete    Row Name 09/14/22 2005           Does patient have an order for bedrest or is patient medically unstable No - Continue assessment       What is the highest level of mobility based on the progressive  mobility assessment? Level 5 (Walks with assist in room/hall) - Balance while stepping forward/back and can walk in room with assist - Complete               Mobility Assessment (Last 72 Hours)     Mobility Assessment     Row Name 09/17/22 1017 09/16/22 2115 09/16/22 0752 09/15/22 2000 09/15/22 0720   Does patient have an order for bedrest or is patient medically unstable No - Continue assessment No - Continue assessment No - Continue assessment No - Continue assessment No - Continue assessment   What is the highest level of mobility based on the progressive mobility assessment? Level 5 (Walks with assist in room/hall) - Balance while stepping forward/back and can walk in room with assist - Complete Level 5 (Walks with assist in room/hall) - Balance while stepping forward/back and can walk in room with assist - Complete Level 5 (Walks with assist in room/hall) - Balance while stepping forward/back and can walk in room with assist - Complete Level 5 (Walks with assist in room/hall) - Balance while stepping forward/back and can walk in room with assist - Complete Level 6 (Walks independently in room and hall) - Balance while walking in room without assist - Complete    Row Name 09/14/22 2005           Does patient have an order for bedrest or is patient medically unstable No - Continue assessment       What is the highest level of mobility based on the progressive mobility assessment? Level 5 (Walks with assist in room/hall) - Balance while stepping forward/back and can walk in room with assist - Complete               Mobility Assessment (Last 72 Hours)     Mobility Assessment      Row Name 09/17/22 1017 09/16/22 2115 09/16/22 0752 09/15/22 2000 09/15/22 0720   Does patient have an order for bedrest or is patient medically unstable No - Continue assessment No - Continue assessment No - Continue assessment No - Continue assessment No - Continue assessment   What is the highest level of mobility based on the progressive mobility assessment? Level 5 (Walks with assist in room/hall) - Balance while stepping forward/back and can walk in room with assist - Complete Level 5 (Walks with assist in room/hall) - Balance while stepping forward/back and can walk in room with assist - Complete Level 5 (Walks with assist in room/hall) - Balance while stepping forward/back and can walk in room with assist - Complete Level 5 (Walks with assist in room/hall) - Balance while stepping forward/back and can walk in room with assist - Complete Level 6 (Walks independently in room and hall) - Balance while walking in room without assist - Complete    Row Name 09/14/22 2005           Does patient have an order for bedrest or is patient medically unstable No - Continue assessment       What is the highest level of mobility based on the progressive mobility assessment? Level 5 (Walks with assist in room/hall) - Balance while stepping forward/back and can walk in room with assist - Complete               Mobility Assessment (Last 72 Hours)     Mobility Assessment     Row Name 09/17/22 1017 09/16/22 2115 09/16/22 0752 09/15/22 2000 09/15/22 0720   Does patient have  an order for bedrest or is patient medically unstable No - Continue assessment No - Continue assessment No - Continue assessment No - Continue assessment No - Continue assessment   What is the highest level of mobility based on the progressive mobility assessment? Level 5 (Walks with assist in room/hall) - Balance while stepping forward/back and can walk in room with assist - Complete Level 5 (Walks with assist in room/hall) - Balance  while stepping forward/back and can walk in room with assist - Complete Level 5 (Walks with assist in room/hall) - Balance while stepping forward/back and can walk in room with assist - Complete Level 5 (Walks with assist in room/hall) - Balance while stepping forward/back and can walk in room with assist - Complete Level 6 (Walks independently in room and hall) - Balance while walking in room without assist - Complete    Row Name 09/14/22 2005           Does patient have an order for bedrest or is patient medically unstable No - Continue assessment       What is the highest level of mobility based on the progressive mobility assessment? Level 5 (Walks with assist in room/hall) - Balance while stepping forward/back and can walk in room with assist - Complete               Mobility Assessment (Last 72 Hours)     Mobility Assessment     Row Name 09/17/22 1017 09/16/22 2115 09/16/22 0752 09/15/22 2000 09/15/22 0720   Does patient have an order for bedrest or is patient medically unstable No - Continue assessment No - Continue assessment No - Continue assessment No - Continue assessment No - Continue assessment   What is the highest level of mobility based on the progressive mobility assessment? Level 5 (Walks with assist in room/hall) - Balance while stepping forward/back and can walk in room with assist - Complete Level 5 (Walks with assist in room/hall) - Balance while stepping forward/back and can walk in room with assist - Complete Level 5 (Walks with assist in room/hall) - Balance while stepping forward/back and can walk in room with assist - Complete Level 5 (Walks with assist in room/hall) - Balance while stepping forward/back and can walk in room with assist - Complete Level 6 (Walks independently in room and hall) - Balance while walking in room without assist - Complete    Row Name 09/14/22 2005           Does patient have an order for bedrest or is patient medically unstable No -  Continue assessment       What is the highest level of mobility based on the progressive mobility assessment? Level 5 (Walks with assist in room/hall) - Balance while stepping forward/back and can walk in room with assist - Complete               Mobility Assessment (Last 72 Hours)     Mobility Assessment     Row Name 09/17/22 1017 09/16/22 2115 09/16/22 0752 09/15/22 2000 09/15/22 0720   Does patient have an order for bedrest or is patient medically unstable No - Continue assessment No - Continue assessment No - Continue assessment No - Continue assessment No - Continue assessment   What is the highest level of mobility based on the progressive mobility assessment? Level 5 (Walks with assist in room/hall) - Balance while stepping forward/back and can walk in room with assist - Complete Level 5 (Walks  with assist in room/hall) - Balance while stepping forward/back and can walk in room with assist - Complete Level 5 (Walks with assist in room/hall) - Balance while stepping forward/back and can walk in room with assist - Complete Level 5 (Walks with assist in room/hall) - Balance while stepping forward/back and can walk in room with assist - Complete Level 6 (Walks independently in room and hall) - Balance while walking in room without assist - Complete    Row Name 09/14/22 2005           Does patient have an order for bedrest or is patient medically unstable No - Continue assessment       What is the highest level of mobility based on the progressive mobility assessment? Level 5 (Walks with assist in room/hall) - Balance while stepping forward/back and can walk in room with assist - Complete                Barriers to discharge: none Disposition Plan:  Home Status is: Inpt  Objective: Blood pressure 111/62, pulse 72, temperature 98.7 F (37.1 C), temperature source Oral, resp. rate 16, height 5\' 10"  (1.778 m), last menstrual period 11/01/2018, SpO2 96%.  Examination:  Physical  Exam Constitutional:      Comments: Becoming more weak appearing and lethargic each day  HENT:     Head: Normocephalic and atraumatic.     Mouth/Throat:     Mouth: Mucous membranes are dry.  Eyes:     Extraocular Movements: Extraocular movements intact.  Cardiovascular:     Rate and Rhythm: Normal rate and regular rhythm.  Pulmonary:     Effort: Pulmonary effort is normal. No respiratory distress.     Breath sounds: Normal breath sounds. No wheezing.  Abdominal:     General: Bowel sounds are decreased. There is distension (worse today).     Tenderness: There is abdominal tenderness (nonspecific).     Comments: BS almost absent now; more firm  Musculoskeletal:        General: No swelling. Normal range of motion.     Cervical back: Normal range of motion and neck supple.  Skin:    General: Skin is warm and dry.  Neurological:     General: No focal deficit present.      Consultants:  Oncology Palliative care  Procedures:  09/14/22: Paracentesis   Data Reviewed: No results found for this or any previous visit (from the past 24 hour(s)).   I have reviewed pertinent nursing notes, vitals, labs, and images as necessary. I have ordered labwork to follow up on as indicated.  I have reviewed the last notes from staff over past 24 hours. I have discussed patient's care plan and test results with nursing staff, CM/SW, and other staff as appropriate.    LOS: 5 days   Lewie Chamber, MD [facility] 09/17/2022, 2:05 PM

## 2022-09-18 DIAGNOSIS — R1084 Generalized abdominal pain: Secondary | ICD-10-CM | POA: Diagnosis not present

## 2022-09-18 MED ORDER — METOCLOPRAMIDE HCL 5 MG/ML IJ SOLN
5.0000 mg | Freq: Three times a day (TID) | INTRAMUSCULAR | Status: DC
  Administered 2022-09-19 – 2022-09-23 (×14): 5 mg via INTRAVENOUS
  Filled 2022-09-18 (×14): qty 2

## 2022-09-18 MED ORDER — PANTOPRAZOLE SODIUM 40 MG PO TBEC: 40.0000 mg | DELAYED_RELEASE_TABLET | Freq: Two times a day (BID) | ORAL | Status: DC

## 2022-09-18 MED ORDER — METHOCARBAMOL 1000 MG/10ML IJ SOLN
1000.0000 mg | Freq: Three times a day (TID) | INTRAVENOUS | Status: DC
  Administered 2022-09-18 – 2022-09-23 (×13): 1000 mg via INTRAVENOUS
  Filled 2022-09-18 (×6): qty 1000
  Filled 2022-09-18: qty 10
  Filled 2022-09-18 (×5): qty 1000
  Filled 2022-09-18 (×2): qty 10
  Filled 2022-09-18 (×2): qty 1000

## 2022-09-18 MED ORDER — DEXAMETHASONE SODIUM PHOSPHATE 10 MG/ML IJ SOLN
8.0000 mg | Freq: Every day | INTRAMUSCULAR | Status: DC
  Administered 2022-09-18 – 2022-09-22 (×5): 8 mg via INTRAVENOUS
  Filled 2022-09-18 (×5): qty 1

## 2022-09-18 MED ORDER — HYDROMORPHONE 1 MG/ML IV SOLN
INTRAVENOUS | Status: DC
  Administered 2022-09-18: 3.1 mg via INTRAVENOUS
  Administered 2022-09-18: 2.64 mg via INTRAVENOUS
  Administered 2022-09-19: 4.9 mg via INTRAVENOUS
  Administered 2022-09-19: 3.99 mg via INTRAVENOUS
  Administered 2022-09-19: 3.2 mg via INTRAVENOUS
  Administered 2022-09-19: 2.96 mg via INTRAVENOUS
  Administered 2022-09-19: 0.6 mg via INTRAVENOUS
  Administered 2022-09-19: 30 mg via INTRAVENOUS
  Administered 2022-09-20 (×2): 1.97 mg via INTRAVENOUS
  Administered 2022-09-20: 3.97 mg via INTRAVENOUS
  Administered 2022-09-21: 0.915 mg via INTRAVENOUS
  Administered 2022-09-21: 3.99 mg via INTRAVENOUS
  Administered 2022-09-21: 1.4 mg via INTRAVENOUS
  Administered 2022-09-21 (×2): 3.8 mg via INTRAVENOUS
  Administered 2022-09-22: 4 mg via INTRAVENOUS
  Administered 2022-09-22: 3.63 mg via INTRAVENOUS
  Administered 2022-09-22: 1.81 mg via INTRAVENOUS
  Administered 2022-09-22: 2 mg via INTRAVENOUS
  Administered 2022-09-22: 3.65 mg via INTRAVENOUS
  Administered 2022-09-22: 1.68 mg via INTRAVENOUS
  Administered 2022-09-23: 3.43 mg via INTRAVENOUS
  Administered 2022-09-23: 30 mg via INTRAVENOUS
  Filled 2022-09-18 (×3): qty 30

## 2022-09-18 MED ORDER — KETOROLAC TROMETHAMINE 15 MG/ML IJ SOLN
15.0000 mg | Freq: Four times a day (QID) | INTRAMUSCULAR | Status: AC
  Administered 2022-09-18 – 2022-09-23 (×17): 15 mg via INTRAVENOUS
  Filled 2022-09-18 (×16): qty 1

## 2022-09-18 MED ORDER — FAMOTIDINE IN NACL 20-0.9 MG/50ML-% IV SOLN
20.0000 mg | Freq: Two times a day (BID) | INTRAVENOUS | Status: DC
  Administered 2022-09-18 – 2022-09-23 (×9): 20 mg via INTRAVENOUS
  Filled 2022-09-18 (×10): qty 50

## 2022-09-18 MED ORDER — METOCLOPRAMIDE HCL 5 MG/ML IJ SOLN
5.0000 mg | Freq: Four times a day (QID) | INTRAMUSCULAR | Status: DC
  Administered 2022-09-18 (×2): 5 mg via INTRAVENOUS
  Filled 2022-09-18 (×2): qty 2

## 2022-09-18 MED ORDER — HYDROMORPHONE 1 MG/ML IV SOLN
INTRAVENOUS | Status: DC
  Administered 2022-09-18: 1 mg via INTRAVENOUS

## 2022-09-18 MED ORDER — PANTOPRAZOLE SODIUM 40 MG IV SOLR
40.0000 mg | Freq: Two times a day (BID) | INTRAVENOUS | Status: DC
  Administered 2022-09-18 – 2022-09-23 (×10): 40 mg via INTRAVENOUS
  Filled 2022-09-18 (×10): qty 10

## 2022-09-18 MED ORDER — DIAZEPAM 5 MG/ML IJ SOLN
2.5000 mg | INTRAMUSCULAR | Status: DC | PRN
  Administered 2022-09-18 – 2022-09-23 (×9): 2.5 mg via INTRAVENOUS
  Filled 2022-09-18 (×11): qty 2

## 2022-09-18 MED ORDER — LIDOCAINE 5 % EX PTCH
2.0000 | MEDICATED_PATCH | Freq: Every day | CUTANEOUS | Status: DC
  Administered 2022-09-18 – 2022-09-22 (×5): 2 via TRANSDERMAL
  Filled 2022-09-18 (×7): qty 2

## 2022-09-18 NOTE — Plan of Care (Signed)

## 2022-09-18 NOTE — Progress Notes (Signed)
  Progress Note    Tara Jensen   WUJ:[id number]  DOB: November 07, 1965  DOA: 09/12/2022     6 PCP: Medicine, [facility]  Initial CC: abdominal pain/distension   Hospital Course: Tara Jensen is a 57 yo female with PMH stage IV pancreatic adenocarcinoma, depression/anxiety, chemo induced neuropathy, GERD who presented with abdominal pain, distension, and decreased appetite/nausea. She is managed closely outpatient by oncology and palliative care for pain control. Due to worsening symptoms, she was recommended for admission to the hospital for repeat CT abdomen/pelvis and further symptom control.    Today, pt still reporting indigestion, with persistent abdominal fullness and tenderness. Overall poor prognosis. Palliative following closely   Assessment and Plan: Intractable abdominal pain/distension likely 2/2 metastatic pancreatic CA CT A/P with significant progression of disease  Management as per Tara Jensen with Palliative, appreciate help  Malignant Ascites Presumed malignant ascites at this point S/p IR paracentesis on 8/3 with 1 L removed Negative for SBP, followup culture (remains negative) and cytology with malignant cells Repeat para eval performed on 8/5 (not enough fluid to tap)  Pancreatic cancer metastasized to liver Follows with Tara Jensen and Tara. Phillips Jensen  Remains off treatment per oncology; given CT report, still not a good treatment candidate nor would clinical trials likely make a meaningful change; she understands worsening prognosis with current scan and wishes to continue with palliative care and symptom control Initial patient wish was for home with hospice but if continues to decline she may warrant residential hospice vs remaining inpatient   Depression Continue Cymbalta     Antimicrobials: None  DVT prophylaxis:  SCDs  Code Status:   Code Status: DNR     Barriers to discharge: Pain management Disposition Plan:  Pending Status  is: Inpt  Objective: Blood pressure 108/67, pulse 79, temperature 99.1 F (37.3 C), temperature source Oral, resp. rate 16, height 5\' 10"  (1.778 m), weight 47 kg, last menstrual period 11/01/2018, SpO2 93%.    Examination:  General: NAD  Cardiovascular: S1, S2 present Respiratory: CTAB Abdomen: Soft, tender, distended, decreased bowel sounds present Musculoskeletal: No bilateral pedal edema noted Skin: Normal Psychiatry: Fair mood     Consultants:  Oncology Palliative care  Procedures:  09/14/22: Paracentesis   Data Reviewed: No results found for this or any previous visit (from the past 24 hour(s)).       LOS: 6 days   Tara Cedar, MD [facility] 09/18/2022, 2:59 PM

## 2022-09-19 DIAGNOSIS — R1084 Generalized abdominal pain: Secondary | ICD-10-CM | POA: Diagnosis not present

## 2022-09-19 DIAGNOSIS — Z515 Encounter for palliative care: Secondary | ICD-10-CM

## 2022-09-19 DIAGNOSIS — C259 Malignant neoplasm of pancreas, unspecified: Secondary | ICD-10-CM | POA: Diagnosis not present

## 2022-09-19 DIAGNOSIS — C787 Secondary malignant neoplasm of liver and intrahepatic bile duct: Secondary | ICD-10-CM | POA: Diagnosis not present

## 2022-09-19 LAB — CULTURE, BODY FLUID W GRAM STAIN -BOTTLE: Culture: NO GROWTH

## 2022-09-19 NOTE — Progress Notes (Signed)
  Progress Note    Tara Jensen   WUJ:[id number]  DOB: 09-27-1965  DOA: 09/12/2022     7 PCP: Medicine, [facility]  Initial CC: abdominal pain/distension   Hospital Course: Tara Jensen is a 57 yo female with PMH stage IV pancreatic adenocarcinoma, depression/anxiety, chemo induced neuropathy, GERD who presented with abdominal pain, distension, and decreased appetite/nausea. She is managed closely outpatient by oncology and palliative care for pain control. Due to worsening symptoms, she was recommended for admission to the hospital for repeat CT abdomen/pelvis and further symptom control.     Today, patient reports feeling better, did have a large vomitus of which she stated she felt better afterwards.  Noted abdominal distention but denies any worsening/significant pain.  Noted frequent belching with indigestion.   Assessment and Plan: Intractable abdominal pain/distension likely 2/2 metastatic pancreatic CA CT A/P with significant progression of disease  Management as per Tara Jensen with Palliative, appreciate help  Malignant Ascites Presumed malignant ascites at this point S/p IR paracentesis on 8/3 with 1 L removed Negative for SBP, followup culture (remains negative) and cytology with malignant cells Repeat para eval performed on 8/5 (not enough fluid to tap)  Pancreatic cancer metastasized to liver Follows with Tara Jensen and Tara. Phillips Jensen  Remains off treatment per oncology; given CT report, still not a good treatment candidate nor would clinical trials likely make a meaningful change; she understands worsening prognosis with current scan and wishes to continue with palliative care and symptom control Initial patient wished for home with hospice but if continues to decline she may warrant residential hospice vs remaining inpatient   Depression Continue Cymbalta     Antimicrobials: None  DVT prophylaxis:  SCDs  Code Status:   Code Status:  DNR     Barriers to discharge: Pain management Disposition Plan:  Pending Status is: Inpt  Objective: Blood pressure (!) 100/57, pulse 72, temperature 98.3 F (36.8 C), temperature source Oral, resp. rate 20, height 5\' 10"  (1.778 m), weight 47 kg, last menstrual period 11/01/2018, SpO2 93%.    Examination:  General: NAD  Cardiovascular: S1, S2 present Respiratory: CTAB Abdomen: Soft, tender, distended, decreased bowel sounds present Musculoskeletal: No bilateral pedal edema noted Skin: Normal Psychiatry: Fair mood     Consultants:  Oncology Palliative care  Procedures:  09/14/22: Paracentesis   Data Reviewed: No results found for this or any previous visit (from the past 24 hour(s)).       LOS: 7 days   Tara Cedar, MD [facility] 09/19/2022, 2:23 PM

## 2022-09-19 NOTE — Plan of Care (Signed)

## 2022-09-19 NOTE — Progress Notes (Signed)
Palliative Care Progress Note  Tara Jensen is considerably better today in terms of her alertness and symptoms-she had a much better night and actually slept without interruption, although significant discomfort exists due to abdominal distention and tumor burden/bulky disease. She has generalized discomfort and difficulty finding positions where she can rest. She is taking in very little nutrition due to fullness and vomiting. She feels better after she vomits and gets relief from the distention.  Exam: Skeletal, Cachectic  Alert, Awake and oriented, she is able to recall specific dates, she is able to discuss her condition. Good respiratory effort, some fine crackles in right base No edema No skin breakdown Tender point at L2 and has a muscle tender point in her upper back tapezius Her abdomen is tense and distended, tumor bulk can be palpated. Hiccups intermittently.  Assessment and Plan:  Free is unable to take any PO medications without nausea or vomiting. Able to take sips of Ensure and high calorie beverages but no solid food intake for 2 days. I discussed comfort feeding and not forcing nutrition or intake.  Cancer Related Pain Tolerating Fentanyl Patch at q48 Basal Rate Hydromorphone started at 0.5mg /hr-she has used her button 3-4 times in past 24 hours Attempted to stop her toradol but her L2 back pain flared after discontinuing this medication- she needs the antiinflammatory for this despite risks for GI irritation. Decadron IV at 8mg  daily-has not done well when attempted to wean this or give an oral dose. 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 Have tried multiple drugs but somewhat limited due to few IV formulations of medications that help with this-for now the Robaxin seems to be helping Started Olanzapine for hiccups and nausea qhs We have tried positional techniques for hiccups-best on her left side with knees flexed and head  slightly elevated. Added on IV famotidine in addition to PPI to attempt to decrease acid production as much as possible. May consider Thorazine-but she is already getting olanzapine, phenergan and reglan. Nausea/Gastric Outlet obstruction: Given tumor infiltration into the stomach antrum, interventions such as a venting PEG or NG are too high risk to attempt. There may be little we can do to prevent intermittent vomiting but we can expect very 8-12 hours she may have an episode based on acid production in the stomach and other secretions and progressive distention. Positioning is important especially when she has received sedation, also need suction at bedside. She has phenergan IV PRN Reduced dose of reglan -it is contraindicated in complete obstruction but she probably does not have complete obstruction-only partial. 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 Stopped octreotide-this did not seem to be improving her symptoms even when titrated up to higher doses. Constipation:  Severe: Continue relistor for now. No BM in 2 days but also not taking much PO.  Prognosis and Disposition:  Emojean has had a widely varying presentation depending on time of day, meds received and overall comfort level. Yesterday AM she was barely responsive, hallucinating and appeared to be transitioning, but later in the evening nursing reported that she had woken up and was on her computer, determined to handle her own affairs and do last minute - she was also doing that when I saw her this morning. She however got tired very quickly after I arrived and her pain levels increased. She has goals she is trying to achieve knowing that her time is very short and that things could change quickly for her. She is also very  aware of her cognitive and mental status changes and says it is getting harder for her to keep track of all of the things she keeps thinking about that she needs or wants to do. She has specifically asked me to keep  helping her feel well enough to do these things for as long as she can-she doesn't want to be told to let go or give in- she has a very strong will to live even in her dying.   Difficult prognosis -she could have a terminal event at any time but may also stabilize out in her current state.  We have discussed both a hospice facility for her care and home with hospice. The challenge right now is that she is requiring all IV medications (with the exception of TD Fentanyl and RD Olanzapine) to manage very complex symptoms. She needs IV decadron and also the acid suppression from IV PPI and H2 Blockers. She is now doing well with IV Robaxin for persistent hiccups. I am not sure these things can be done outside of the acute care setting -she is hoping to complete her affairs before she dies and I am trying to give her every best chance to do that and prevent suffering in the process.  Will continue to assess her condition and readiness for a hospice discharge daily, but she continues to have high acuity needs.  Anderson Malta, DO Palliative Medicine

## 2022-09-20 DIAGNOSIS — R1084 Generalized abdominal pain: Secondary | ICD-10-CM | POA: Diagnosis not present

## 2022-09-20 NOTE — Plan of Care (Signed)
Alert and oriented x 4. Free of falls and other injures. Vital signs are stable. N/V has improved. Patient rates pain at a 2. Currently tolerating fluids and small bites of food. Ambulated the hallway and sat in the chair. Visitors at bedside.   Problem: Nutrition: Goal: Adequate nutrition will be maintained Outcome: Progressing   Problem: Coping: Goal: Level of anxiety will decrease Outcome: Progressing   Problem: Pain Managment: Goal: General experience of comfort will improve Outcome: Progressing

## 2022-09-20 NOTE — Plan of Care (Signed)

## 2022-09-20 NOTE — Progress Notes (Signed)
  Progress Note    Tara Jensen   [id number]  DOB: [birth year]  DOA: 09/12/2022     8 PCP: Medicine, [facility]  Initial CC: abdominal pain/distension   Hospital Course: Tara Jensen is a 57 yo female with PMH stage IV pancreatic adenocarcinoma, depression/anxiety, chemo induced neuropathy, GERD who presented with abdominal pain, distension, and decreased appetite/nausea. She is managed closely outpatient by oncology and palliative care for pain control. Due to worsening symptoms, she was recommended for admission to the hospital for repeat CT abdomen/pelvis and further symptom control.     Today, pt with no new complaints, her symptoms seem to be stable   Assessment and Plan: Intractable abdominal pain/distension likely 2/2 metastatic pancreatic CA CT A/P with significant progression of disease  Management as per Dr Phillips Odor with Palliative, appreciate help  Malignant Ascites Presumed malignant ascites at this point S/p IR paracentesis on 8/3 with 1 L removed Negative for SBP, followup culture (remains negative) and cytology with malignant cells Repeat para eval performed on 8/5 (not enough fluid to tap)  Pancreatic cancer metastasized to liver Follows with Dr. Truett Perna and Dr. Phillips Odor  Remains off treatment per oncology; given CT report, still not a good treatment candidate nor would clinical trials likely make a meaningful change; she understands worsening prognosis with current scan and wishes to continue with palliative care and symptom control Initial patient wished for home with hospice but if continues to decline she may warrant residential hospice vs remaining inpatient   Depression Continue Cymbalta     Antimicrobials: None  DVT prophylaxis:  SCDs  Code Status:   Code Status: DNR     Barriers to discharge: Pain management Disposition Plan:  Pending Status is: Inpt  Objective: Blood pressure 110/70, pulse 76, temperature 98.5 F  (36.9 C), temperature source Oral, resp. rate 18, height 5\' 10"  (1.778 m), weight 47 kg, last menstrual period 11/01/2018, SpO2 98%.    Examination:  General: NAD  Cardiovascular: S1, S2 present Respiratory: CTAB Abdomen: Soft, tender, distended, decreased bowel sounds present Musculoskeletal: No bilateral pedal edema noted Skin: Normal Psychiatry: Fair mood     Consultants:  Oncology Palliative care  Procedures:  09/14/22: Paracentesis   Data Reviewed: No results found for this or any previous visit (from the past 24 hour(s)).       LOS: 8 days   Briant Cedar, MD [facility] 09/20/2022, 3:38 PM

## 2022-09-21 ENCOUNTER — Inpatient Hospital Stay (HOSPITAL_COMMUNITY): Payer: BC Managed Care – PPO

## 2022-09-21 DIAGNOSIS — R1084 Generalized abdominal pain: Secondary | ICD-10-CM | POA: Diagnosis not present

## 2022-09-21 MED ORDER — BISACODYL 10 MG RE SUPP
10.0000 mg | Freq: Once | RECTAL | Status: AC
  Administered 2022-09-21: 10 mg via RECTAL
  Filled 2022-09-21: qty 1

## 2022-09-21 MED ORDER — ALBUMIN HUMAN 25 % IV SOLN
12.5000 g | Freq: Once | INTRAVENOUS | Status: AC
  Administered 2022-09-21: 12.5 g via INTRAVENOUS
  Filled 2022-09-21: qty 50

## 2022-09-21 MED ORDER — LIDOCAINE HCL 1 % IJ SOLN
INTRAMUSCULAR | Status: AC
  Filled 2022-09-21: qty 20

## 2022-09-21 MED ORDER — FENTANYL 75 MCG/HR TD PT72
2.0000 | MEDICATED_PATCH | TRANSDERMAL | Status: DC
  Administered 2022-09-21: 2 via TRANSDERMAL

## 2022-09-21 MED ORDER — LIDOCAINE HCL (PF) 1 % IJ SOLN
10.0000 mL | Freq: Once | INTRAMUSCULAR | Status: AC
  Administered 2022-09-21: 10 mL via INTRADERMAL

## 2022-09-21 NOTE — Plan of Care (Signed)

## 2022-09-21 NOTE — Plan of Care (Signed)
  Problem: Education: Goal: Knowledge of General Education information will improve Description: Including pain rating scale, medication(s)/side effects and non-pharmacologic comfort measures 09/21/2022 0608 by Faustino Congress, RN Outcome: Progressing 09/21/2022 0608 by Faustino Congress, RN Outcome: Progressing   Problem: Health Behavior/Discharge Planning: Goal: Ability to manage health-related needs will improve 09/21/2022 0608 by Faustino Congress, RN Outcome: Progressing 09/21/2022 0608 by Faustino Congress, RN Outcome: Progressing   Problem: Clinical Measurements: Goal: Ability to maintain clinical measurements within normal limits will improve Outcome: Progressing Goal: Will remain free from infection Outcome: Progressing Goal: Diagnostic test results will improve Outcome: Progressing Goal: Respiratory complications will improve Outcome: Progressing Goal: Cardiovascular complication will be avoided Outcome: Progressing   Problem: Activity: Goal: Risk for activity intolerance will decrease Outcome: Progressing   Problem: Nutrition: Goal: Adequate nutrition will be maintained Outcome: Progressing   Problem: Coping: Goal: Level of anxiety will decrease Outcome: Progressing   Problem: Elimination: Goal: Will not experience complications related to bowel motility Outcome: Progressing Goal: Will not experience complications related to urinary retention Outcome: Progressing   Problem: Pain Managment: Goal: General experience of comfort will improve Outcome: Progressing   Problem: Safety: Goal: Ability to remain free from injury will improve Outcome: Progressing   Problem: Skin Integrity: Goal: Risk for impaired skin integrity will decrease Outcome: Progressing

## 2022-09-21 NOTE — Procedures (Signed)
Interventional Radiology Procedure:   Indications: Abdominal distention.  Pancreatic cancer  Procedure: US guided paracentesis  Findings: Removed 500 ml from left lower abdominal quadrant.  There appears to be a large distended loop of colon or stomach in left abdomen.  Complications: None     EBL: Less than 10 ml    R. Lowella Dandy, MD  Pager: [phone]

## 2022-09-21 NOTE — Progress Notes (Signed)
  Progress Note    KEYANAH OVER   [id number]  DOB: 1965-08-15  DOA: 09/12/2022     9 PCP: Medicine, [facility]  Initial CC: abdominal pain/distension   Hospital Course: Ms. Mesker is a 57 yo female with PMH stage IV pancreatic adenocarcinoma, depression/anxiety, chemo induced neuropathy, GERD who presented with abdominal pain, distension, and decreased appetite/nausea. She is managed closely outpatient by oncology and palliative care for pain control. Due to worsening symptoms, she was recommended for admission to the hospital for repeat CT abdomen/pelvis and further symptom control.     Today, pt reports worsening abdominal distension, otherwise appears to be in good spirit. [place] for ascites eval pending    Assessment and Plan: Intractable abdominal pain/distension likely 2/2 metastatic pancreatic CA CT A/P with significant progression of disease  Management as per Dr Phillips Odor with Palliative, appreciate help  Malignant Ascites Presumed malignant ascites at this point S/p IR paracentesis on 8/3 with 1 L removed Negative for SBP, followup culture (remains negative) and cytology with malignant cells Repeat para eval performed on 8/5 (not enough fluid to tap) Repeat USS for ascites pending  Pancreatic cancer metastasized to liver Follows with Dr. Truett Perna and Dr. Phillips Odor  Remains off treatment per oncology; given CT report, still not a good treatment candidate nor would clinical trials likely make a meaningful change; she understands worsening prognosis with current scan and wishes to continue with palliative care and symptom control Initial patient wished for home with hospice but if continues to decline she may warrant residential hospice vs remaining inpatient   Depression Continue Cymbalta     Antimicrobials: None  DVT prophylaxis:  SCDs  Code Status:   Code Status: DNR     Barriers to discharge: Pain management Disposition Plan:   Pending Status is: Inpt  Objective: Blood pressure (!) 100/59, pulse 84, temperature 98.4 F (36.9 C), temperature source Oral, resp. rate 16, height 5\' 10"  (1.778 m), weight 47 kg, last menstrual period 11/01/2018, SpO2 98%.    Examination:  General: NAD  Cardiovascular: S1, S2 present Respiratory: CTAB Abdomen: Soft, tender, distended, decreased bowel sounds present Musculoskeletal: No bilateral pedal edema noted Skin: Normal Psychiatry: Fair mood     Consultants:  Oncology Palliative care  Procedures:  09/14/22: Paracentesis   Data Reviewed: No results found for this or any previous visit (from the past 24 hour(s)).       LOS: 9 days   Briant Cedar, MD [facility] 09/21/2022, 2:44 PM

## 2022-09-21 NOTE — Progress Notes (Signed)
Patient is improved-she is sitting up in bed with her laptop, mentally clear, ambulated down the hall and back- significnat change over past 2 days- her PO intake is still minimal but this is increasing as well- liquids and sips only. Estate attorney arrived during my visit-this is Tara Jensen's main goal- to get her affairs in best possible order.  Maintain all currrent interventions- will possibly try to transition to small amounts of oral/SL meds ( the ones that can't be given via PCA at home w/ hospice). Continue aggressive symptom managmement- may needs another [place] to determine is ascites is accumulating.   Anderson Malta, DO Palliative Medicine

## 2022-09-22 DIAGNOSIS — R1084 Generalized abdominal pain: Secondary | ICD-10-CM | POA: Diagnosis not present

## 2022-09-22 MED ORDER — OLANZAPINE 10 MG PO TBDP
10.0000 mg | ORAL_TABLET | Freq: Every day | ORAL | Status: DC
  Filled 2022-09-22: qty 1

## 2022-09-22 NOTE — Progress Notes (Signed)
  Progress Note    Tara Jensen   [id number]  DOB: 1965-12-21  DOA: 09/12/2022     10 PCP: Medicine, [facility]  Initial CC: abdominal pain/distension   Hospital Course: Tara Jensen is a 57 yo female with PMH stage IV pancreatic adenocarcinoma, depression/anxiety, chemo induced neuropathy, GERD who presented with abdominal pain, distension, and decreased appetite/nausea. She is managed closely outpatient by oncology and palliative care for pain control. Due to worsening symptoms, she was recommended for admission to the hospital for repeat CT abdomen/pelvis and further symptom control.     Today, noted to be more lethargic today.. sleeping during my visit, did not wake her up    Assessment and Plan: Intractable abdominal pain/distension likely 2/2 metastatic pancreatic CA CT A/P with significant progression of disease  Management as per Dr Phillips Odor with Palliative, appreciate help  Malignant Ascites Presumed malignant ascites at this point S/p IR paracentesis on 8/3 with 1 L removed Negative for SBP, followup culture (remains negative) and cytology with malignant cells Repeat para eval performed on 8/5 (not enough fluid to tap) Repeat para performed on 8/10, 500 ml removed  Pancreatic cancer metastasized to liver Follows with Dr. Truett Perna and Dr. Phillips Odor  Remains off treatment per oncology; given CT report, still not a good treatment candidate nor would clinical trials likely make a meaningful change; she understands worsening prognosis with current scan and wishes to continue with palliative care and symptom control Initial patient wished for home with hospice but if continues to decline she may warrant residential hospice vs remaining inpatient   Depression Continue Cymbalta     Antimicrobials: None  DVT prophylaxis:  SCDs  Code Status:   Code Status: DNR     Barriers to discharge: Pain management Disposition Plan:  Pending Status is:  Inpt  Objective: Blood pressure 106/62, pulse 92, temperature 98.7 F (37.1 C), temperature source Oral, resp. rate 18, height 5\' 10"  (1.778 m), weight 47 kg, last menstrual period 11/01/2018, SpO2 94%.    Examination:  General: NAD  Cardiovascular: S1, S2 present Respiratory: CTAB Abdomen: Soft, tender, distended, decreased bowel sounds present Musculoskeletal: No bilateral pedal edema noted Skin: Normal Psychiatry: Fair mood     Consultants:  Oncology Palliative care  Procedures:  09/14/22: Paracentesis   Data Reviewed: No results found for this or any previous visit (from the past 24 hour(s)).       LOS: 10 days   Briant Cedar, MD [facility] 09/22/2022, 5:19 PM

## 2022-09-23 ENCOUNTER — Inpatient Hospital Stay: Payer: BC Managed Care – PPO

## 2022-09-23 ENCOUNTER — Inpatient Hospital Stay: Payer: BC Managed Care – PPO | Admitting: Oncology

## 2022-09-23 DIAGNOSIS — R1084 Generalized abdominal pain: Secondary | ICD-10-CM | POA: Diagnosis not present

## 2022-09-23 MED ORDER — MIDAZOLAM-SODIUM CHLORIDE 100-0.9 MG/100ML-% IV SOLN
1.0000 mg/h | INTRAVENOUS | Status: DC
  Administered 2022-09-23: 1 mg/h via INTRAVENOUS
  Filled 2022-09-23: qty 100

## 2022-09-23 MED ORDER — HYDROMORPHONE HCL-NACL 50-0.9 MG/50ML-% IV SOLN
2.0000 mg/h | INTRAVENOUS | Status: DC
  Administered 2022-09-23: 2 mg/h via INTRAVENOUS
  Filled 2022-09-23: qty 50

## 2022-09-23 MED ORDER — GLYCOPYRROLATE 0.2 MG/ML IJ SOLN
0.2000 mg | INTRAMUSCULAR | Status: DC
  Administered 2022-09-23: 0.2 mg via INTRAVENOUS
  Filled 2022-09-23: qty 1

## 2022-09-23 MED ORDER — HYDROMORPHONE BOLUS VIA INFUSION: 2.0000 mg | INTRAVENOUS | Status: DC | PRN

## 2022-09-23 MED ORDER — DIAZEPAM 5 MG/ML IJ SOLN
2.5000 mg | INTRAMUSCULAR | Status: DC
  Administered 2022-09-23 (×2): 2.5 mg via INTRAVENOUS
  Filled 2022-09-23: qty 2

## 2022-09-23 MED ORDER — GUAIFENESIN 100 MG/5ML PO LIQD
5.0000 mL | ORAL | Status: DC | PRN
  Administered 2022-09-23: 5 mL via ORAL
  Filled 2022-09-23: qty 10

## 2022-09-23 MED ORDER — HALOPERIDOL LACTATE 5 MG/ML IJ SOLN
INTRAMUSCULAR | Status: AC
  Administered 2022-09-23: 2 mg via INTRAVENOUS
  Filled 2022-09-23: qty 1

## 2022-09-23 MED ORDER — HALOPERIDOL LACTATE 5 MG/ML IJ SOLN: 2.0000 mg | INTRAMUSCULAR | Status: AC

## 2022-09-23 MED ORDER — MIDAZOLAM BOLUS VIA INFUSION: 1.0000 mg | INTRAVENOUS | Status: DC | PRN

## 2022-09-23 MED ORDER — HYDROMORPHONE 1 MG/ML IV SOLN
INTRAVENOUS | Status: DC
  Administered 2022-09-23: 14.58 mg via INTRAVENOUS

## 2022-10-13 NOTE — Progress Notes (Signed)
Family at bedside. Tara Jensen became increasingly restless and agitated, trying to get out of bed. She became incontinent of urine and stool. She was frequently trying to reposition herself in bed with assistance. She began to have a congested cough which became worse during the afternoon. Dr. Phillips Odor aware and orders were written. Foley was inserted due to incontinence. Respiratory called to suction patient. Oxygen SATs decreasing 44-58. Tara Jensen expired at 1922.

## 2022-10-13 NOTE — Plan of Care (Signed)

## 2022-10-13 NOTE — Progress Notes (Signed)
Tara Jensen having increasing terminal agitation and distress. Heavy congestion, now not managing secretions.Severe dyspnea and discomfort evident. Initiate continuous hydromorphone infusion with bolus q30 min w/titration order. Initiated a versed infusion for palliative sedation and agitation- gave 1X dose of haldol 2mg  IV for hallucinations. Oxygen saturations 40% extremitites are mottled, periods of apnea, loss of bowel and bladder. Anticipate hospital deathj in next 24 hours. Family at bedside.  Anderson Malta, DO Palliative Medicine

## 2022-10-13 NOTE — Progress Notes (Signed)
  Progress Note    Tara Jensen   [id number]  DOB: [birth year]  DOA: 09/12/2022     11 PCP: Medicine, [facility]  Initial CC: abdominal pain/distension   Hospital Course: Tara Jensen is a 57 yo female with PMH stage IV pancreatic adenocarcinoma, depression/anxiety, chemo induced neuropathy, GERD who presented with abdominal pain, distension, and decreased appetite/nausea. She is managed closely outpatient by oncology and palliative care for pain control. Due to worsening symptoms, she was recommended for admission to the hospital for repeat CT abdomen/pelvis and further symptom control.     Today, friends at bedside to say their final goodbyes... More unresponsive this am. Pt had a burst of energy over the past few days, during this time was working on completing all her affairs/task at hand.  Anticipate in hospital death. Dr Phillips Odor estimates in the next 24H    Assessment and Plan: Intractable abdominal pain/distension likely 2/2 metastatic pancreatic CA CT A/P with significant progression of disease  Management as per Dr Phillips Odor with Palliative, appreciate help  Malignant Ascites Presumed malignant ascites at this point S/p IR paracentesis on 8/3 with 1 L removed Negative for SBP, followup culture (remains negative) and cytology with malignant cells Repeat para eval performed on 8/5 (not enough fluid to tap) Repeat para performed on 8/10, 500 ml removed  Pancreatic cancer metastasized to liver Follows with Dr. Truett Perna and Dr. Phillips Odor  Remains off treatment per oncology; given CT report, not a good treatment candidate nor would clinical trials likely make a meaningful change; she understands worsening prognosis with current scan and wishes to continue with palliative care and symptom control Plan for in-hospital death    Antimicrobials: None  DVT prophylaxis:  SCDs  Code Status:   Code Status: DNR     Barriers to discharge: Anticipate  in-hospital death Disposition Plan:  Anticipate in-hospital death Status is: Inpt  Objective: Blood pressure (!) 100/56, pulse (!) 54, temperature 98.5 F (36.9 C), temperature source Oral, resp. rate 17, height 5\' 10"  (1.778 m), weight 47 kg, last menstrual period 11/01/2018, SpO2 90%.    Examination:  None   Consultants:  Oncology Palliative care  Procedures:  09/14/22 and 09/21/22: Paracentesis   Data Reviewed: No results found for this or any previous visit (from the past 24 hour(s)).       LOS: 11 days   Briant Cedar, MD [facility] 09/18/2022, 5:23 PM

## 2022-10-13 NOTE — Death Summary Note (Signed)
DEATH SUMMARY   Patient Details  Name: Tara Jensen MRN: [id number] DOB: October 21, 1965 [id number], [facility] Family Admission/Discharge Information   Admit Date:  2022-09-26  Date of Death: Date of Death: Oct 07, 2022  Time of Death: Time of Death: 1922  Length of Stay: 2022/10/06   Principle Cause of death: Metastatic pancreatic cancer   Hospital Diagnoses: Principal Problem:   Abdominal pain Active Problems:   Pancreatic cancer metastasized to liver (HCC)   Ascites   Protein-calorie malnutrition, severe   Depression   Palliative care patient   Hospital Course: [name] is a 58 yo female with PMH stage IV pancreatic adenocarcinoma, depression/anxiety, chemo induced neuropathy, GERD who presented with abdominal pain, distension, and decreased appetite/nausea. She is managed closely outpatient by oncology and palliative care for pain control. Due to worsening symptoms, she was recommended for admission to the hospital for pain management and symptom control. Patient switched to comfort care and passed away on 07-Oct-2022.    Assessment and Plan:  Intractable abdominal pain/distension likely 2/2 metastatic pancreatic CA CT A/P with significant progression of disease  Management as per Dr Phillips Odor with Palliative   Malignant Ascites S/p IR paracentesis on 8/3 with 1 L removed Negative for SBP, followup culture (remains negative) and cytology with malignant cells Repeat para eval performed on 8/5 (not enough fluid to tap) Repeat para performed on 8/10, 500 ml removed   Pancreatic cancer metastasized to liver CT report, not a good treatment candidate nor would clinical trials likely make a meaningful change; she understood worsening prognosis with current scan and wished to continue with palliative care and symptom control Patient was on comfort care, passed away on 10/07/22      Procedures: Paracentesis   Consultations: Oncology, palliative  The results of significant  diagnostics from this hospitalization (including imaging, microbiology, ancillary and laboratory) are listed below for reference.   Significant Diagnostic Studies: US Paracentesis  Result Date: 10/06/22 INDICATION: Pancreatic cancer and abdominal distension. Evaluate for paracentesis. EXAM: ULTRASOUND GUIDED THERAPEUTIC PARACENTESIS MEDICATIONS: 1% lidocaine for local anesthetic COMPLICATIONS: None immediate. PROCEDURE: Informed written consent was obtained from the patient after a discussion of the risks, benefits and alternatives to treatment. A timeout was performed prior to the initiation of the procedure. Initial ultrasound scanning demonstrates a small amount of ascites within the left lower abdominal quadrant. The left lower abdomen was prepped and draped in the usual sterile fashion. 1% lidocaine was used for local anesthesia. Following this, a Yueh catheter was introduced. An ultrasound image was saved for documentation purposes. The paracentesis was performed. The catheter was removed and a dressing was applied. The patient tolerated the procedure well without immediate post procedural complication. FINDINGS: A total of approximately 500 mL of yellow fluid was removed. Large amount of complex fluid in the left anterior abdomen appears to be within a bowel structure. IMPRESSION: Successful ultrasound-guided paracentesis yielding 500 mL of peritoneal fluid. Electronically Signed   By: Richarda Overlie M.D.   On: 2022-10-06 08:25   [place] ASCITES (ABDOMEN LIMITED)  Result Date: 10-06-22 CLINICAL DATA:  Pancreatic cancer and abdominal distension. Evaluate for ascites. EXAM: LIMITED ABDOMEN ULTRASOUND FOR ASCITES TECHNIQUE: Limited ultrasound survey for ascites was performed in all four abdominal quadrants. COMPARISON:  CT 09/13/2022 FINDINGS: Echogenic material in the abdomen appears to be associated with bowel structures. There is complex fluid identified in the abdomen which is likely either within the  stomach or colon. No significant ascites is identified on these images. IMPRESSION: No  significant ascites identified. Electronically Signed   By: Richarda Overlie M.D.   On: 09/22/2022 08:19   DG Abd Portable 1V  Result Date: 09/17/2022 CLINICAL DATA:  Abdominal distention EXAM: PORTABLE ABDOMEN - 1 VIEW COMPARISON:  CT done on 09/13/2022 FINDINGS: There is diffuse haziness in abdomen suggesting possible ascites. Bowel gas pattern is nonspecific. IMPRESSION: Nonspecific bowel gas pattern.  Ascites. Electronically Signed   By: Ernie Avena M.D.   On: 09/17/2022 13:08   [place] ASCITES (ABDOMEN LIMITED)  Result Date: 09/16/2022 CLINICAL DATA:  Abdominal distension. History of pancreatic carcinoma invading posterior wall gastric antrum, ascites EXAM: ULTRASOUND ABDOMEN LIMITED COMPARISON:  CT 09/13/2022 FINDINGS: The central upper abdominal region of distension corresponds to a markedly distended stomach containing gas and debris. There is a small amount of scattered ascites in the lower abdomen. Trace perihepatic fluid. IMPRESSION: 1. Marked gastric distension, considerations include gastroparesis versus gastric outlet obstruction. 2. Small volume ascites. Electronically Signed   By: Corlis Leak M.D.   On: 09/16/2022 16:23   US Paracentesis  Result Date: 09/14/2022 INDICATION: 57 year old female with history of pancreatic cancer presents with ascites seen on previous CT. Request for therapeutic and diagnostic paracentesis. EXAM: ULTRASOUND GUIDED  PARACENTESIS MEDICATIONS: 8 mL 1% lidocaine COMPLICATIONS: None immediate. PROCEDURE: Informed written consent was obtained from the patient after a discussion of the risks, benefits and alternatives to treatment. A timeout was performed prior to the initiation of the procedure. Initial ultrasound scanning demonstrates a small amount of ascites within the left lower abdominal quadrant. The left lower abdomen was prepped and draped in the usual sterile fashion. 1%  lidocaine was used for local anesthesia. Following this, a 19 gauge, 7-cm, Yueh catheter was introduced. An ultrasound image was saved for documentation purposes. The paracentesis was performed. The catheter was removed and a dressing was applied. The patient tolerated the procedure well without immediate post procedural complication. FINDINGS: A total of approximately 1 L of hazy yellow fluid was removed. Samples were sent to the laboratory as requested by the clinical team. IMPRESSION: Successful ultrasound-guided paracentesis yielding 1 liters of peritoneal fluid. Performed by: Lawernce Ion, PA-C Electronically Signed   By: Acquanetta Belling M.D.   On: 09/14/2022 10:38   CT ABDOMEN PELVIS W CONTRAST  Result Date: 09/13/2022 CLINICAL DATA:  Metastatic disease evaluation. Question gastric outlet obstruction. Pancreatic cancer. * Tracking Code: BO * EXAM: CT ABDOMEN AND PELVIS WITH CONTRAST TECHNIQUE: Multidetector CT imaging of the abdomen and pelvis was performed using the standard protocol following bolus administration of intravenous contrast. RADIATION DOSE REDUCTION: This exam was performed according to the departmental dose-optimization program which includes automated exposure control, adjustment of the mA and/or kV according to patient size and/or use of iterative reconstruction technique. CONTRAST:  75mL OMNIPAQUE IOHEXOL 300 MG/ML  SOLN COMPARISON:  07/25/2022 FINDINGS: Lower chest: 4 mm right middle lobe nodule on image 10/series 4 is new since chest CT of 02/23/2022. Additional tiny nodules in the lung bases measure in the 3-4 mm size range, also new since January of this year. Some of these nodules are seen on the 07/25/2022 study and were only miniscule at that time consistent with progression in the interval. Tiny left pleural effusion noted. Hepatobiliary: Index left hepatic lobe lesion measures 3.9 x 2.6 cm today compared to 3.0 x 1.8 cm previously. 1.9 cm lesion medial right liver on 23/2 was not  definitely seen on the previous noncontrast exam but is new since previous contrast infused study of 04/15/2022. Additional  small lesions in the dome of the liver, lateral segment left liver, and inferior right liver are also new since 04/15/2022. Gallbladder is nondistended. No intrahepatic or extrahepatic biliary dilation. Pancreas: Pancreatic mass measures 9.7 x 5.2 cm today, increased from 8.4 x 5.6 cm previously. There is gas in the central necrotic portion of the study, potentially necrosis although the lesion appears to invade the posterior wall the gastric antrum which may also account for the intralesional gas. Lesion encases and attenuates the celiac axis, common hepatic artery, and splenic artery although these vessels do opacify. SMA is also encased but opacifies throughout suggesting persistent patency. There is a 12 mm enhancing focus immediately anterior to the SMA in the central portion of the pancreatic mass. This could be a nodule along the course of the SMA. Pseudoaneurysm is considered less likely but not excluded. Spleen: No splenomegaly. No suspicious focal mass lesion. Adrenals/Urinary Tract: No adrenal nodule or mass. Kidneys unremarkable. No evidence for hydroureter. Bladder is decompressed. Stomach/Bowel: Stomach is nondistended. As noted above, gastric mass appears to invade the posterior wall the gastric antrum. Pancreatic mass generates substantial mass-effect on the duodenum although no findings to suggest duodenal obstruction. No small bowel wall thickening. No small bowel dilatation. No gross colonic mass. No colonic wall thickening. Vascular/Lymphatic: No abdominal aortic aneurysm. No abdominal aortic atherosclerotic calcification. Main portal vein is patent with substantial mass-effect on the portal splenic confluence as before, splenic vein is occluded with extensive collateralization in the abdomen. Posterior extent of the pancreatic mass markedly attenuates the crossing left  renal vein and patency of this structure cannot be confirmed. No discrete retroperitoneal or pelvic sidewall lymphadenopathy. Reproductive: Unremarkable. Other: Large volume ascites is new in the interval. Irregular soft tissue nodules are seen in the cul-de-sac including 2.1 x 1.6 cm enhancing nodule on 87/2. Musculoskeletal: Subtle lytic lesion in the L2 vertebral body measures 2.3 x 2.4 cm, new in the interval and compatible with metastatic disease. IMPRESSION: 1. Interval progression of disease with marked interval enlargement of the pancreatic mass, enlarging hepatic metastases, new and progressive pulmonary metastases and a new bone metastasis at L2. This progression is associated with new large volume ascites and drop metastases in the cul-de-sac. 2. Pancreatic mass appears to invade the posterior wall the gastric antrum. The mass does generate mass-effect on the stomach and duodenum without an overt obstructive appearance at this time. 3. 12 mm enhancing focus immediately anterior to the SMA in the central portion of the pancreatic mass. This could be a nodule along the course of the SMA. SMA pseudoaneurysm is considered less likely but not excluded. As detailed above, the pancreatic mass encases the celiac axis and SMA. 4. 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 Marked attenuation of the crossing left renal vein by the pancreatic mass. Patency of this structure cannot be confirmed although renal perfusion is symmetric without findings of vascular congestion in the left kidney. 6. Tiny left pleural effusion. Electronically Signed   By: Kennith Center M.D.   On: 09/13/2022 16:27   DG Abd 2 Views  Result Date: 09/10/2022 CLINICAL DATA:  abdominal distention with stage IV pancreatic cancer, need to rule out obstruction and look for ascites EXAM: ABDOMEN - 2 VIEW COMPARISON:  None Available. FINDINGS: Upper abdomen excludes, such that free intraperitoneal gas cannot be  excluded. Stomach is partially distended. Small bowel decompressed. Moderate colonic fecal material without dilatation. Normal abdominal calcifications. Regional bones unremarkable. IMPRESSION: Nonobstructive bowel gas pattern. Electronically  Signed   By: Corlis Leak M.D.   On: 09/10/2022 12:28   IR ABDOMEN US LIMITED  Result Date: 09/10/2022 CLINICAL DATA:  Pancreatic carcinoma, history of ascites, paracentesis requested EXAM: LIMITED ABDOMEN ULTRASOUND FOR ASCITES TECHNIQUE: Limited ultrasound survey for ascites was performed in all four abdominal quadrants. COMPARISON:  None Available. FINDINGS: Small volume abdominal ascites. No dominant pocket for safe paracentesis. Paracentesis deferred. IMPRESSION: Small volume ascites. Paracentesis deferred. Electronically Signed   By: Corlis Leak M.D.   On: 09/10/2022 12:27   US Abdomen Limited  Result Date: 08/27/2022 CLINICAL DATA:  History of pancreatic cancer with distended abdomen. Evaluate for ascites. EXAM: ULTRASOUND ABDOMEN LIMITED COMPARISON:  None Available. FINDINGS: Four quadrant abdominal ultrasound was performed which demonstrated no ascites. IMPRESSION: No ascites. Electronically Signed   By: Sherian Rein M.D.   On: 08/27/2022 07:59    Microbiology: No results found for this or any previous visit (from the past 240 hour(s)).    Signed: Briant Cedar, MD

## 2022-10-13 NOTE — Progress Notes (Signed)
Tara Jensen is unresponsive this AM. Intractable coughing started overnight likely due to tumor progression. Discussed palliative sedation with HCPOA at bedside. Will focus on keeping her restful and comfortable as primary goal now. She was able to complete most of her affairs over the past few days.Prepared family for terminal trajectory.  Anderson Malta, DO Palliative Medicine

## 2022-10-13 DEATH — deceased

## 2023-01-01 NOTE — Telephone Encounter (Signed)
Telephone call  

## 2024-02-20 IMAGING — CT CT ABD-PELV W/ CM
2 of 5 series · 15 of 46 positions shown, 17 images · IV contrast (APPLIED)
Comparison: None Available.

CLINICAL DATA: Epigastric pain; * Tracking Code: BO *

EXAM:
CT ABDOMEN AND PELVIS WITH CONTRAST
TECHNIQUE: Multidetector CT imaging of the abdomen and pelvis was performed
using the standard protocol following bolus administration of
intravenous contrast.

[Series 2: axial st · axial · 0.69mm/px · z∈[-736,-311]mm · 12 of 99 slices shown, 14 images]
[im 7/99  soft-tissue]
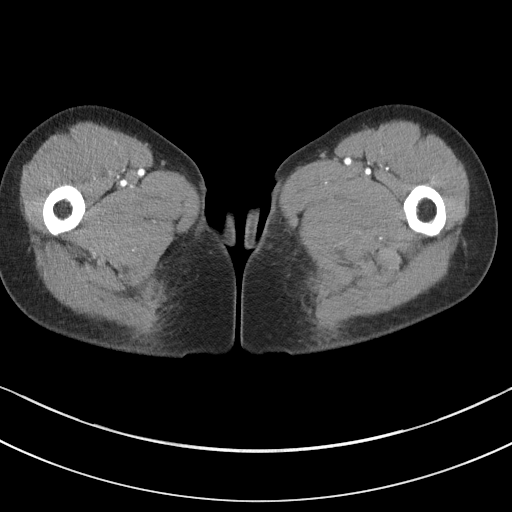
[im 7/99  bone]
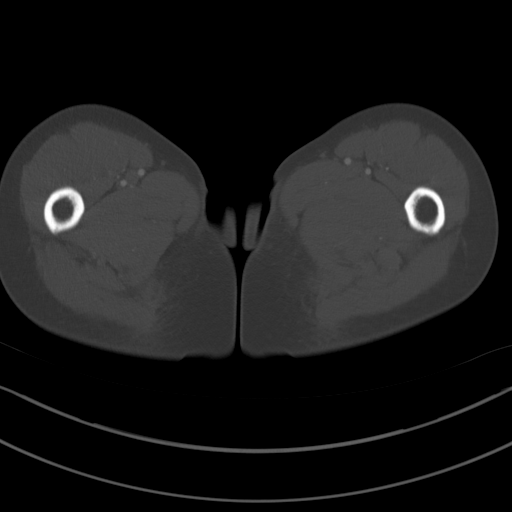
[im 14/99  soft-tissue]
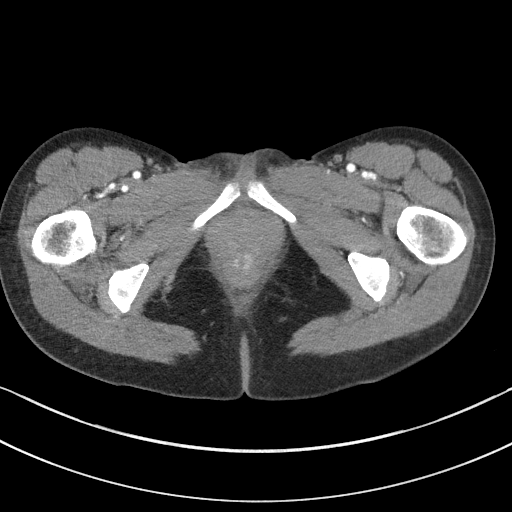
[im 20/99  soft-tissue]
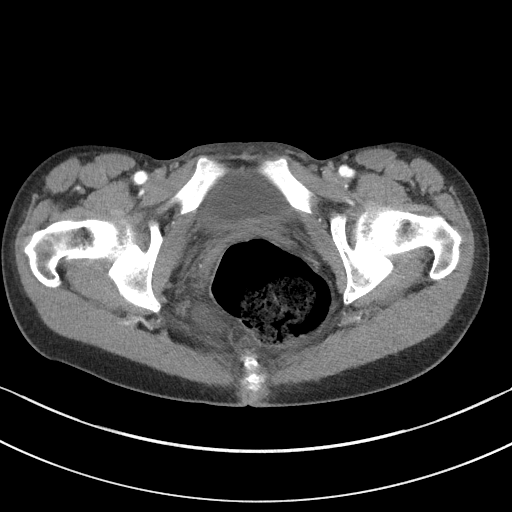
[im 33/99  soft-tissue]
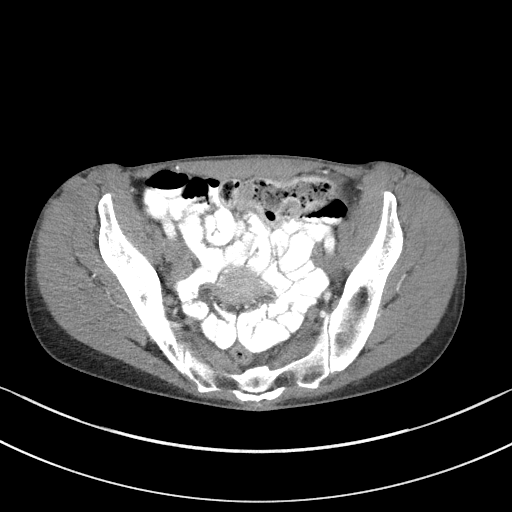
[im 40/99  soft-tissue]
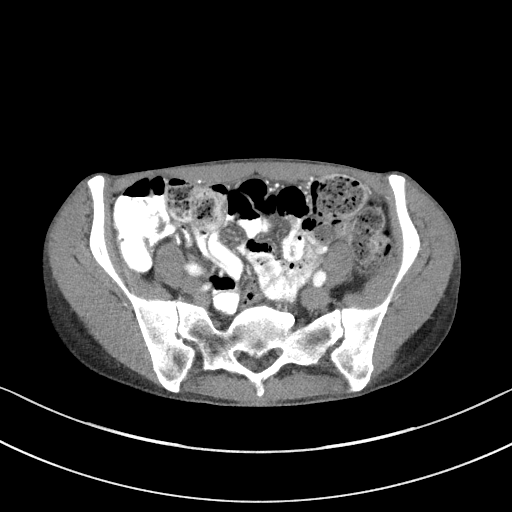
[im 46/99  soft-tissue]
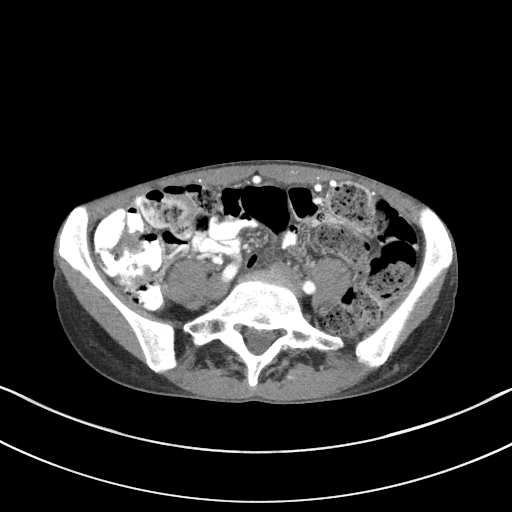
[im 53/99  soft-tissue]
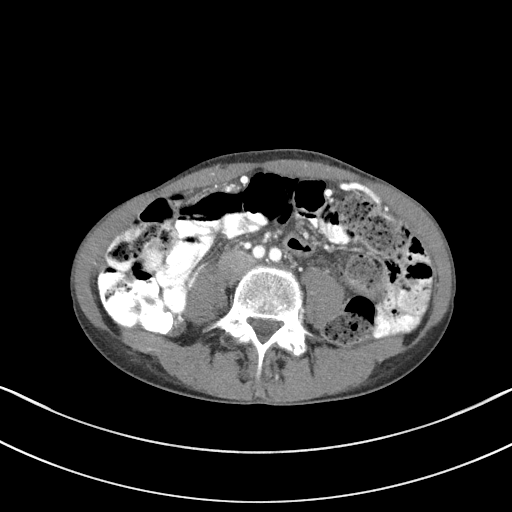
[im 59/99  soft-tissue]
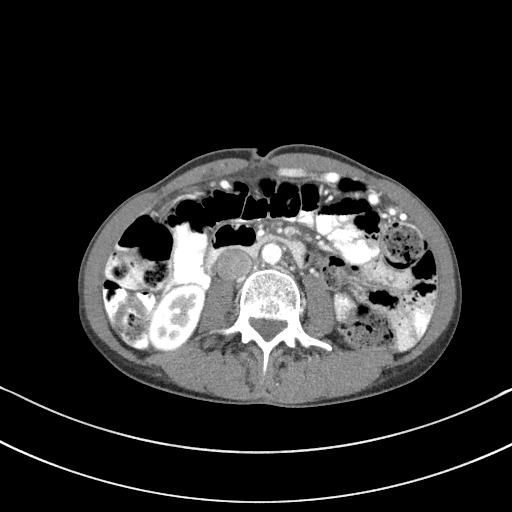
[im 66/99  soft-tissue]
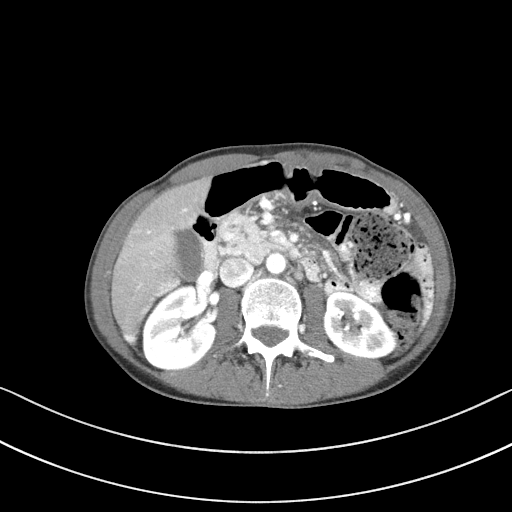
[im 66/99  bone]
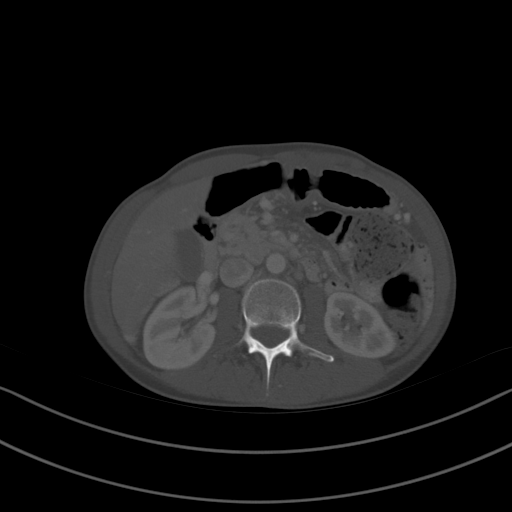
[im 79/99  soft-tissue]
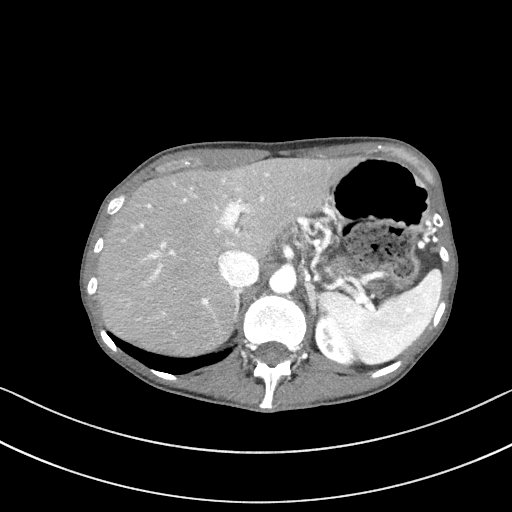
[im 85/99  soft-tissue]
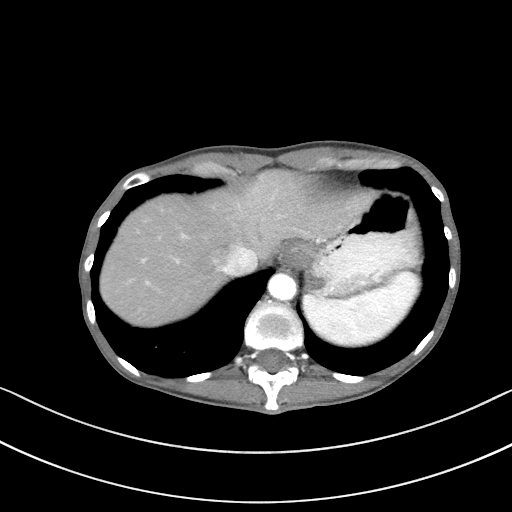
[im 92/99  soft-tissue]
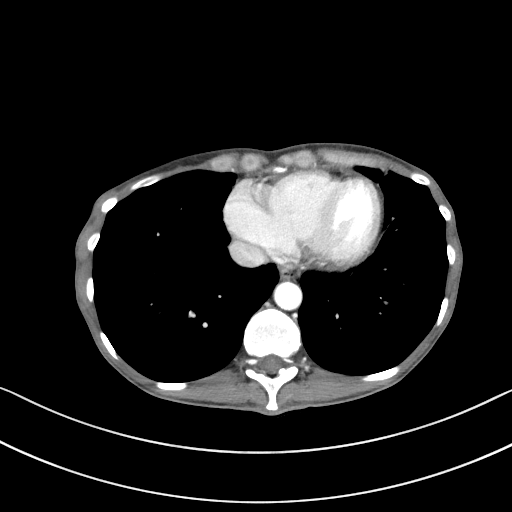

[Series 6: coronal st · coronal · 0.68mm/px · 3 of 82 slices shown]
[im 28/82  soft-tissue]
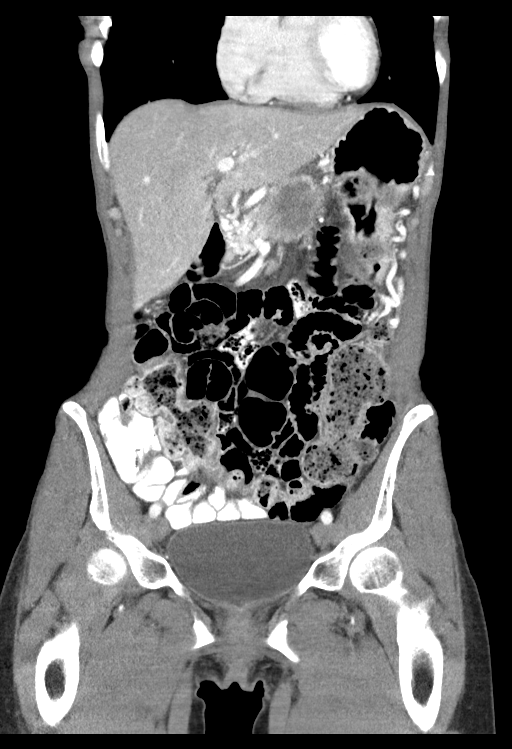
[im 37/82  soft-tissue]
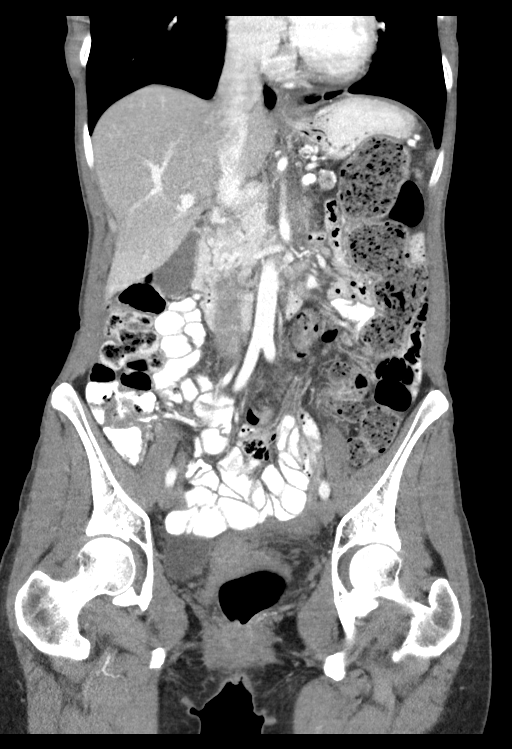
[im 46/82  soft-tissue]
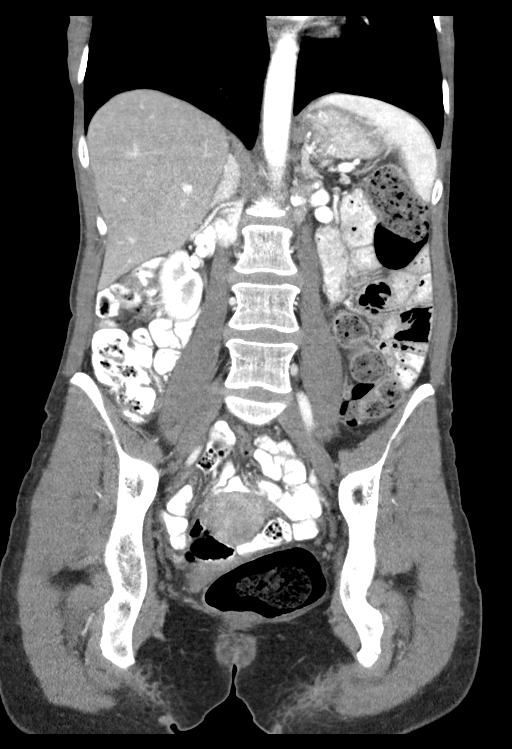

[15 of 46 positions shown; findings below may reference images not displayed]

RADIATION DOSE REDUCTION: This exam was performed according to the
departmental dose-optimization program which includes automated
exposure control, adjustment of the mA and/or kV according to
patient size and/or use of iterative reconstruction technique.

CONTRAST:  100mL OMNIPAQUE IOHEXOL 300 MG/ML  SOLN
FINDINGS: Lower chest: No acute abnormality.

Hepatobiliary: Hepatic steatosis. No suspicious liver lesions.
Gallbladder is unremarkable. No biliary ductal dilation.

Pancreas: Irregular mass of the pancreatic body which is likely
centrally necrotic measuring 3.7 x 4.3 cm on series 2, image 27
causing occlusion of the SMV and splenic veins. Filling defect of
the distal SMV seen on series 2 image 33, likely due to thrombus.
Portal vein is patent. Less than 180 degree abutment of the distal
celiac, common hepatic and splenic arteries. Less than 180 degree
abutment of the SMA. Atrophy of the pancreatic tail with associated
duct dilation. Upper abdominal varices, likely related to SMV and
splenic vein occlusion.

Spleen: Normal in size without focal abnormality.

Adrenals/Urinary Tract: Bilateral adrenal glands are unremarkable.
Kidneys enhance symmetrically with no evidence of hydronephrosis or
nephrolithiasis. Bladder is unremarkable.

Stomach/Bowel: Stomach is within normal limits. Appendix is not
definitely visualized, although there are no secondary findings of
acute appendicitis. No evidence of bowel wall thickening,
distention, or inflammatory changes.

Vascular/Lymphatic: Vascular considerations related to mass as
above. No atherosclerotic disease. Prominent subcentimeter left
periaortic lymph node measuring 9 mm in short axis on series 2,
image 31.

Reproductive: Uterus and bilateral adnexa are unremarkable.

Other: No abdominal wall hernia or abnormality. No abdominopelvic
ascites.

Musculoskeletal: No suspicious osseous lesions.
IMPRESSION: 1. Irregular mass of the pancreatic body measuring up to 4.3 cm
causing occlusion of the proximal splenic vein and SMV, findings are
highly concerning for primary pancreatic malignancy.
2. SMV is patent distally with a small filling defect which is
likely due to thrombus.
3. Prominent subcentimeter left periaortic lymph node, concerning
for nodal metastatic disease.

These results will be called to the ordering clinician or
representative by the Radiologist Assistant, and communication
documented in the PACS or [REDACTED].

## 2024-02-27 IMAGING — CT CT CHEST W/ CM
2 of 4 series · 15 of 36 positions shown, 18 images · IV contrast (APPLIED)
Comparison: CT abdomen 05/11/2021

CLINICAL DATA: Patient states she's for follow up from pancreatic
cancer diagnosis last week. Abdomen/pelvis CT shows pancreatic mass.
Looking for metastasis. - MW 60 ml Omni 300 ^60mL OMNIPAQUE
IOHEXOL 300 MG/ML SOLN

EXAM:
CT CHEST WITH CONTRAST
TECHNIQUE: Multidetector CT imaging of the chest was performed during
intravenous contrast administration.

[Series 2: routine chest with · axial · 0.57mm/px · z∈[-83,+241]mm · 12 of 190 slices shown, 15 images]
[im 14/190  mediastinal]
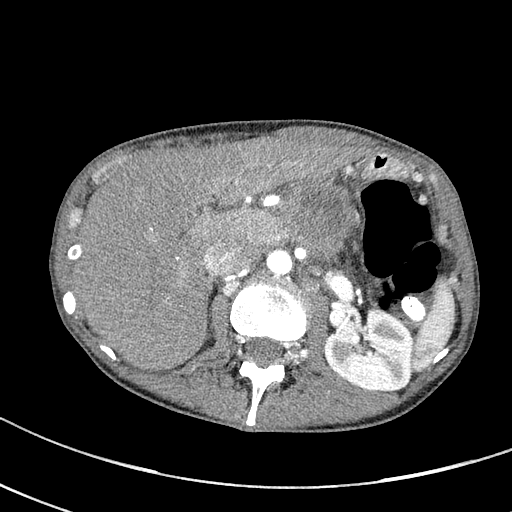
[im 14/190  lung]
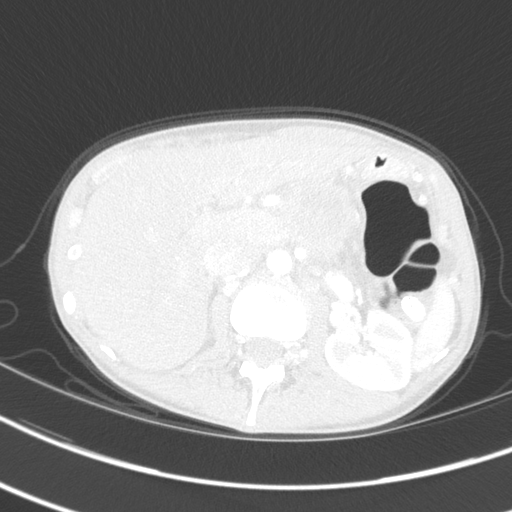
[im 28/190  lung]
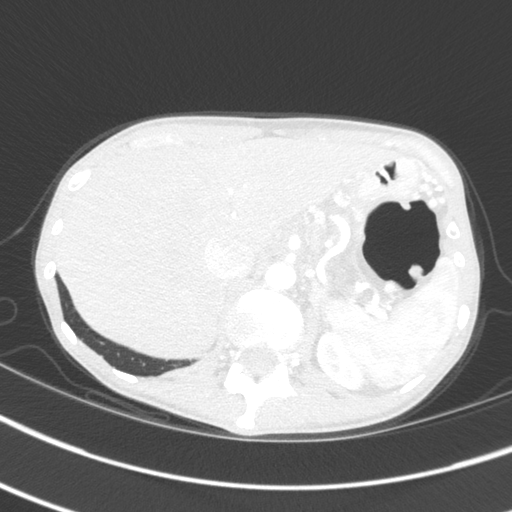
[im 41/190  lung]
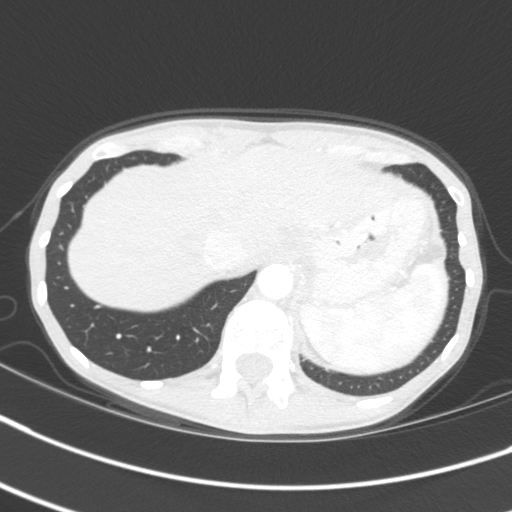
[im 55/190  lung]
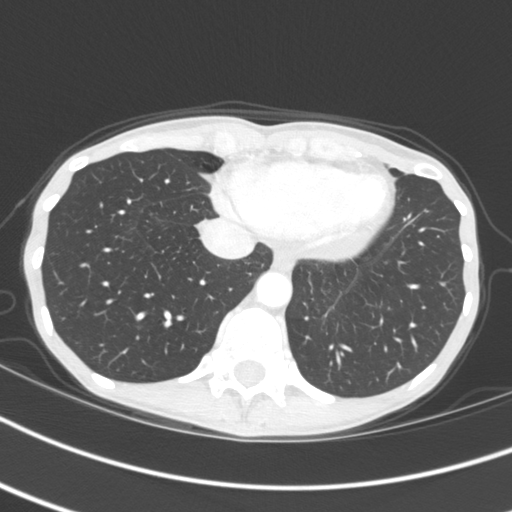
[im 68/190  mediastinal]
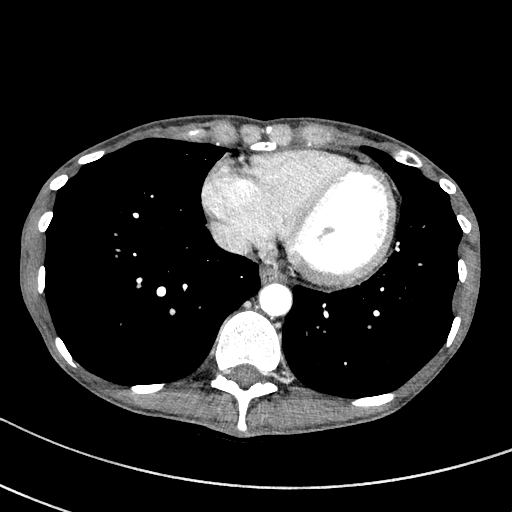
[im 68/190  lung]
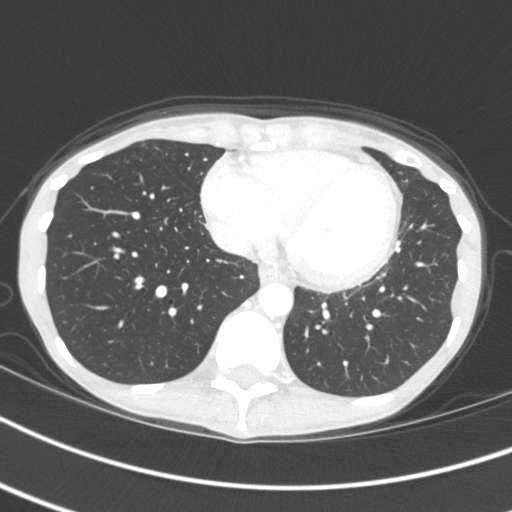
[im 82/190  lung]
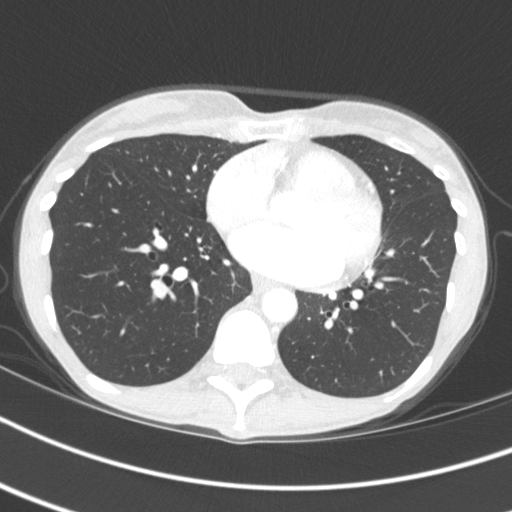
[im 109/190  lung]
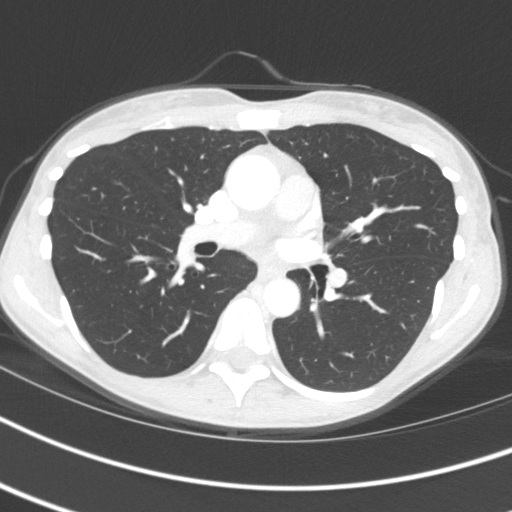
[im 122/190  lung]
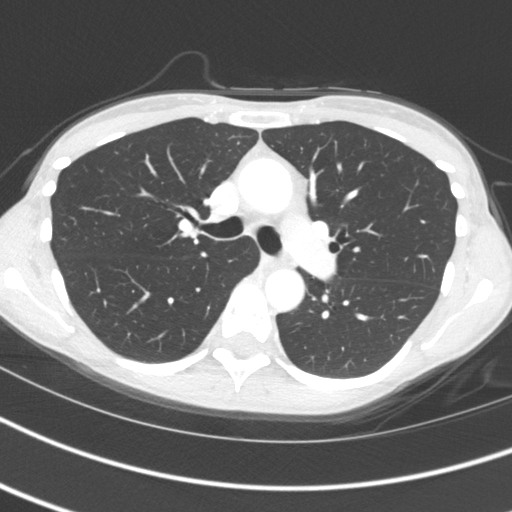
[im 136/190  mediastinal]
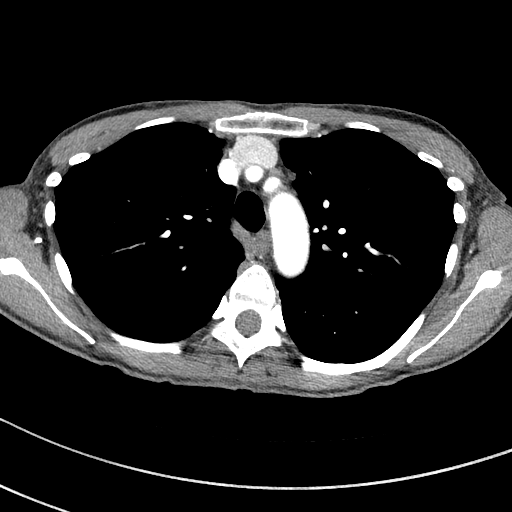
[im 136/190  lung]
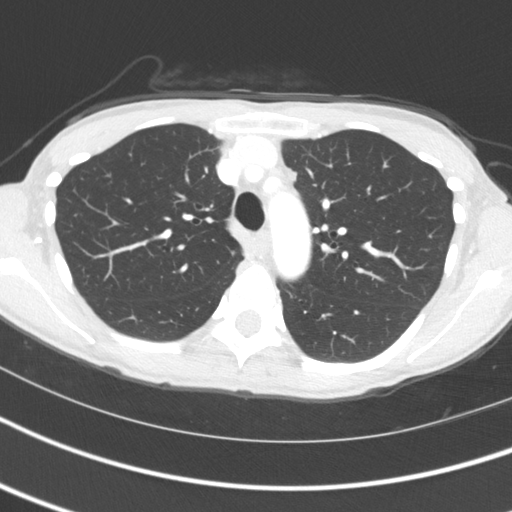
[im 149/190  lung]
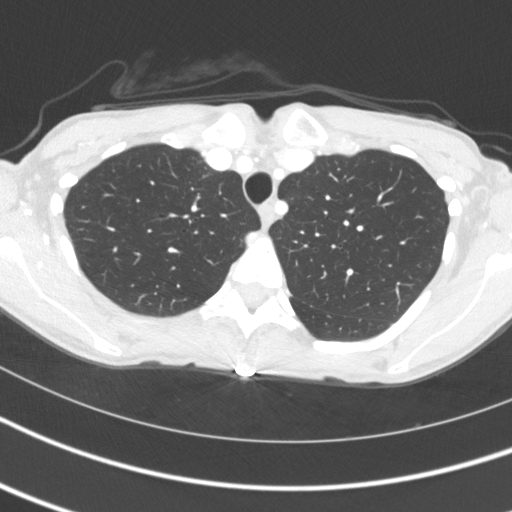
[im 163/190  lung]
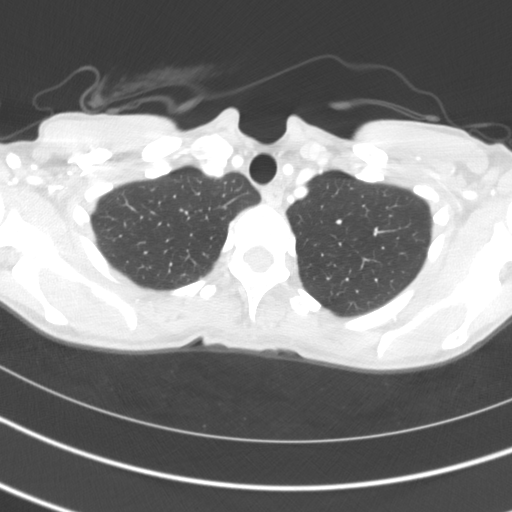
[im 176/190  lung]
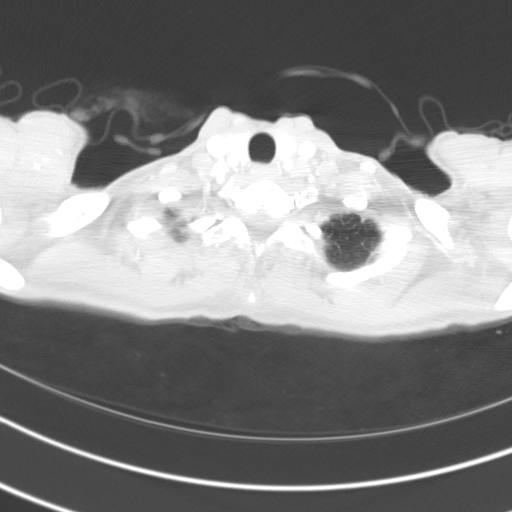

[Series 5: coronal · coronal · 0.65mm/px · 3 of 101 slices shown]
[im 21/101  lung]
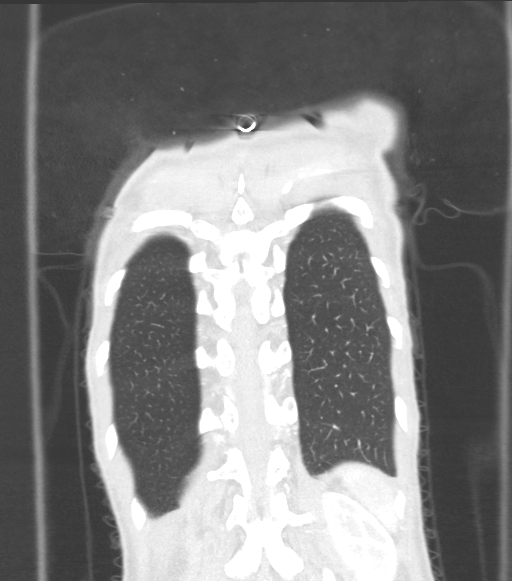
[im 41/101  lung]
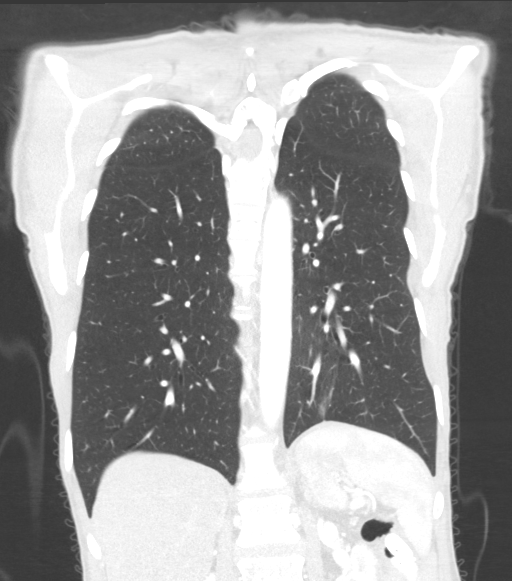
[im 61/101  lung]
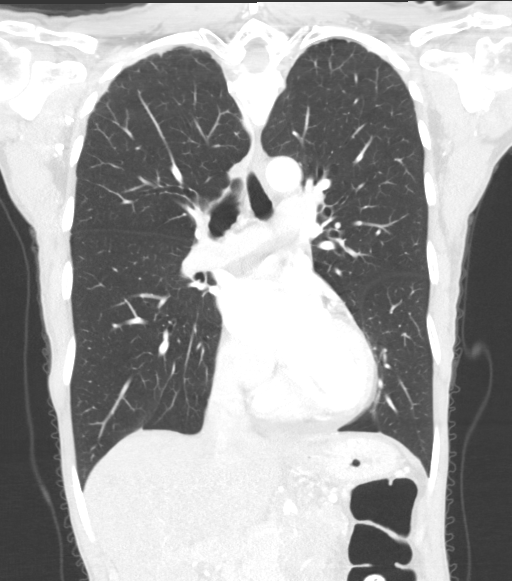

[15 of 36 positions shown; findings below may reference images not displayed]

RADIATION DOSE REDUCTION: This exam was performed according to the
departmental dose-optimization program which includes automated
exposure control, adjustment of the mA and/or kV according to
patient size and/or use of iterative reconstruction technique.

CONTRAST:  60mL OMNIPAQUE IOHEXOL 300 MG/ML  SOLN
FINDINGS: Cardiovascular: No significant vascular findings. Normal heart size.
No pericardial effusion.

Mediastinum/Nodes: No axillary or supraclavicular adenopathy. No
mediastinal or hilar adenopathy. No pericardial fluid. Esophagus
normal.

Lungs/Pleura: No suspicious pulmonary nodules. Normal pleural.
Airways normal.

Upper Abdomen: Mass in the mid pancreatic body again noted measuring
up to 4 cm.

Musculoskeletal: No acute osseous abnormality.
IMPRESSION: 1. No evidence of pulmonary metastasis or mediastinal nodal
metastasis.
2. Pancreatic mass.  See CT 05/11/2021.

## 2024-03-12 IMAGING — DX DG CHEST 1V PORT
1 series · 1 of 1 positions shown · non-contrast
Comparison: CT chest dated July 18, 2021.

CLINICAL DATA: Port placement.

EXAM:
PORTABLE CHEST 1 VIEW

[chest ap]
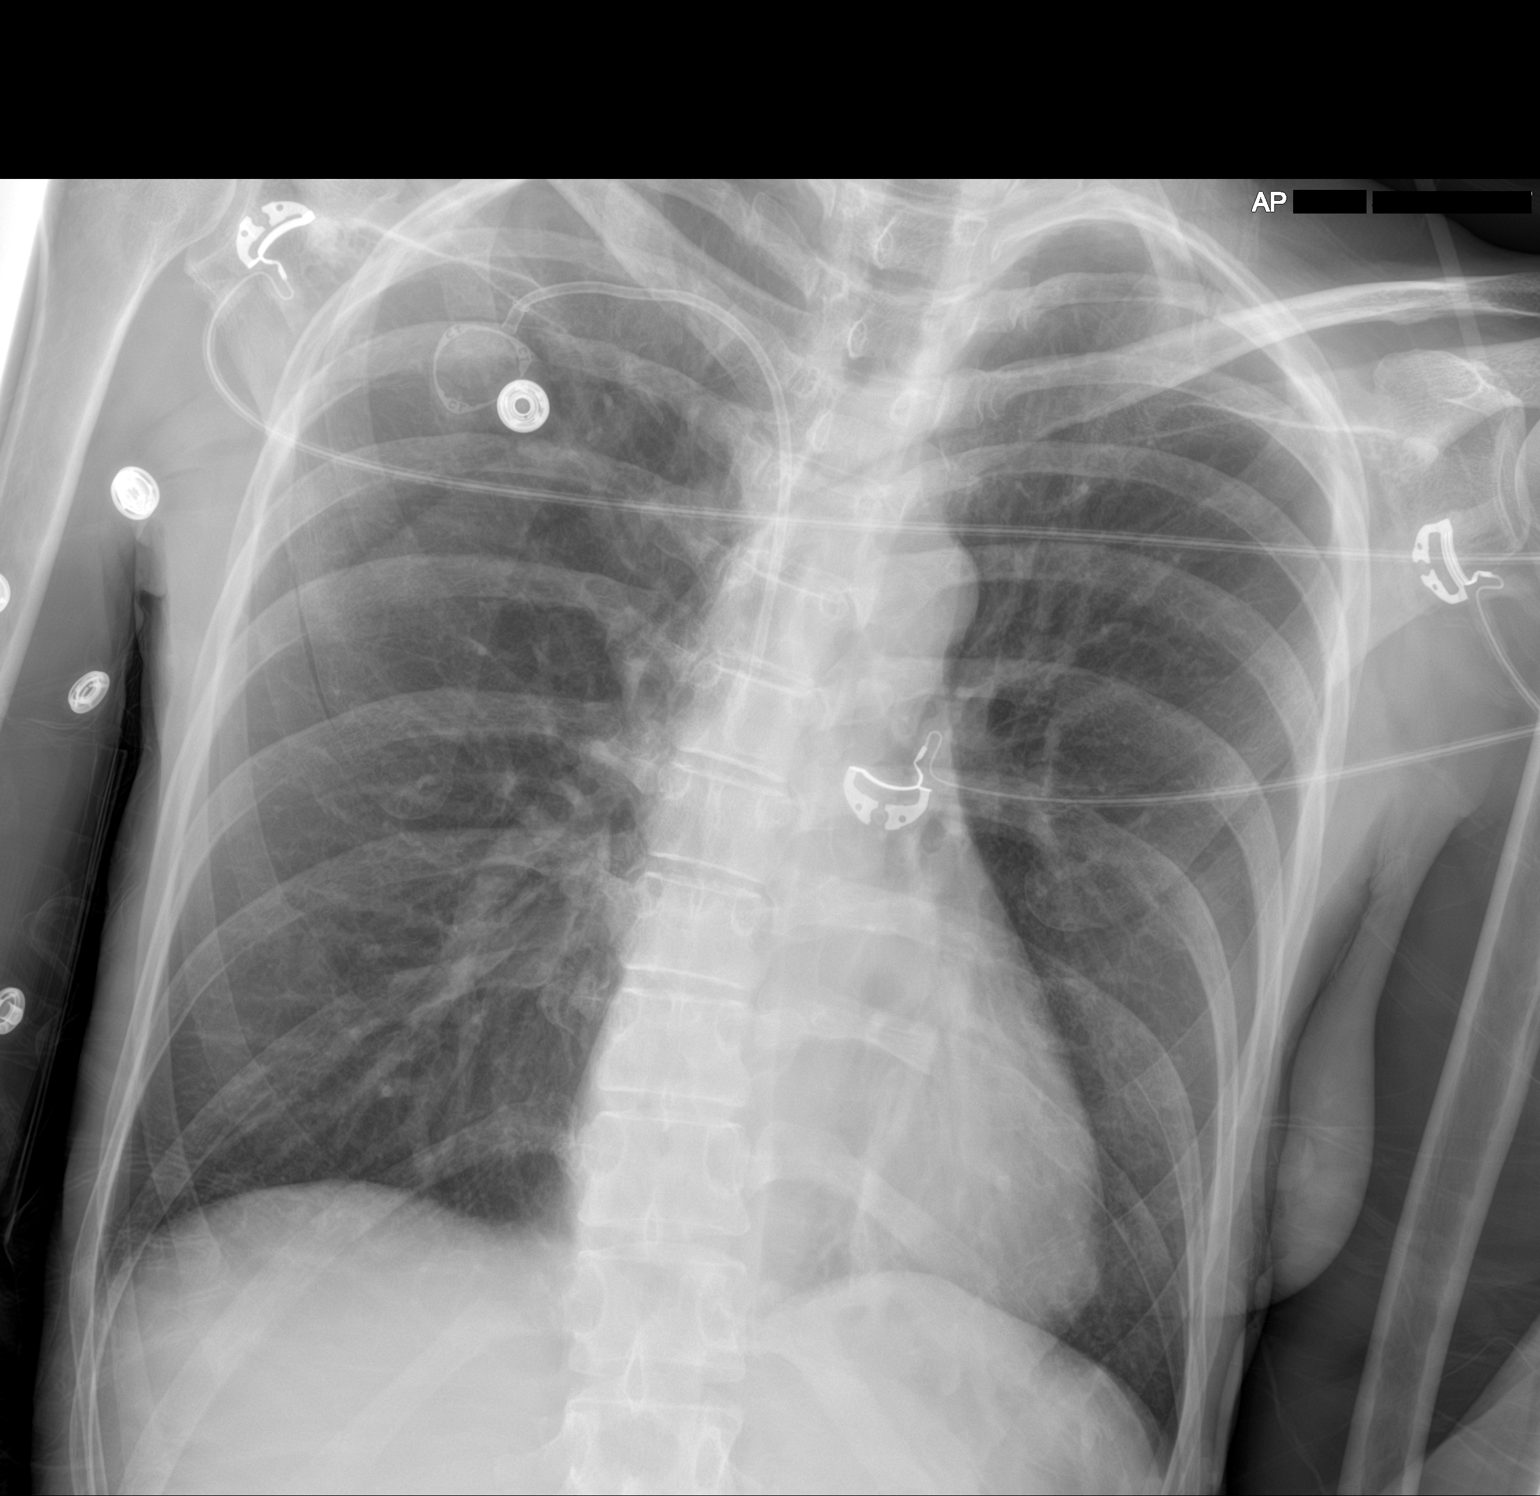

[1 of 1 positions shown; findings below may reference images not displayed]

FINDINGS: New right chest wall port catheter with tip in the mid SVC. The
heart size and mediastinal contours are within normal limits. Normal
pulmonary vascularity. No focal consolidation, pleural effusion, or
pneumothorax. No acute osseous abnormality.
IMPRESSION: 1. New right chest wall port catheter without complication.

## 2024-03-12 IMAGING — RF DG C-ARM 1-60 MIN
1 series · 1 of 1 positions shown · non-contrast
Comparison: None Available.

CLINICAL DATA: Port placement.

EXAM:
DG C-ARM 1-60 MIN

[Series 1: run · 1 of 1 slices shown]
[im 1/1]
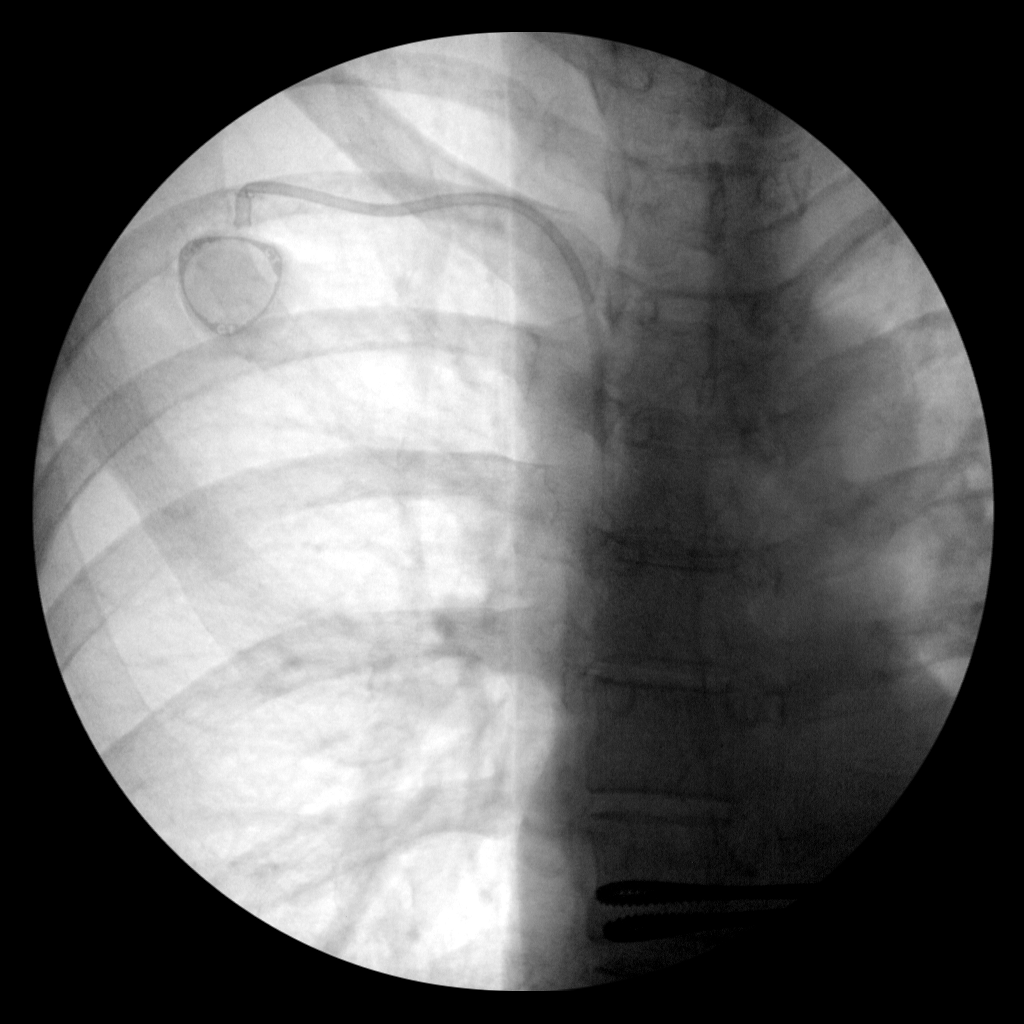

[1 of 1 positions shown; findings below may reference images not displayed]

FLUOROSCOPY TIME:  Radiation Exposure Index (as provided by the
fluoroscopic device): 4.05 mGy Kerma

C-arm fluoroscopic images were obtained intraoperatively and
submitted for post operative interpretation.
FINDINGS: Single intraoperative fluoroscopic images demonstrates new right
subclavian port catheter with tip in the proximal SVC.
IMPRESSION: 1. Intraoperative fluoroscopic guidance for right subclavian port
catheter placement.
